# Patient Record
Sex: Male | Born: 1955 | Race: White | Hispanic: No | Marital: Married | Smoking: Former smoker
Health system: Southern US, Community
[De-identification: ages and names within clinical notes are randomized; demographics above are authoritative.]

## PROBLEM LIST (undated history)

## (undated) DIAGNOSIS — B20 Human immunodeficiency virus [HIV] disease: Secondary | ICD-10-CM

## (undated) DIAGNOSIS — Z21 Asymptomatic human immunodeficiency virus [HIV] infection status: Secondary | ICD-10-CM

## (undated) DIAGNOSIS — J449 Chronic obstructive pulmonary disease, unspecified: Secondary | ICD-10-CM

## (undated) DIAGNOSIS — I10 Essential (primary) hypertension: Secondary | ICD-10-CM

## (undated) DIAGNOSIS — K621 Rectal polyp: Secondary | ICD-10-CM

## (undated) DIAGNOSIS — K743 Primary biliary cirrhosis: Secondary | ICD-10-CM

## (undated) DIAGNOSIS — Z9889 Other specified postprocedural states: Secondary | ICD-10-CM

## (undated) DIAGNOSIS — K449 Diaphragmatic hernia without obstruction or gangrene: Secondary | ICD-10-CM

## (undated) DIAGNOSIS — C419 Malignant neoplasm of bone and articular cartilage, unspecified: Secondary | ICD-10-CM

## (undated) DIAGNOSIS — C3490 Malignant neoplasm of unspecified part of unspecified bronchus or lung: Secondary | ICD-10-CM

## (undated) DIAGNOSIS — K219 Gastro-esophageal reflux disease without esophagitis: Secondary | ICD-10-CM

## (undated) DIAGNOSIS — K5792 Diverticulitis of intestine, part unspecified, without perforation or abscess without bleeding: Secondary | ICD-10-CM

## (undated) HISTORY — PX: LIVER BIOPSY: SHX301

## (undated) HISTORY — PX: NASAL SEPTUM SURGERY: SHX37

## (undated) HISTORY — DX: Primary biliary cirrhosis: K74.3

## (undated) HISTORY — DX: Essential (primary) hypertension: I10

## (undated) HISTORY — DX: Other specified postprocedural states: Z98.890

## (undated) HISTORY — PX: OTHER SURGICAL HISTORY: SHX169

## (undated) HISTORY — DX: Chronic obstructive pulmonary disease, unspecified: J44.9

## (undated) HISTORY — PX: CHOLECYSTECTOMY: SHX55

## (undated) HISTORY — DX: Rectal polyp: K62.1

---

## 2001-03-04 ENCOUNTER — Ambulatory Visit (HOSPITAL_COMMUNITY): Admission: RE | Admit: 2001-03-04 | Discharge: 2001-03-04 | Payer: Self-pay | Admitting: Neurosurgery

## 2001-03-04 ENCOUNTER — Encounter: Payer: Self-pay | Admitting: Neurosurgery

## 2001-10-23 ENCOUNTER — Ambulatory Visit (HOSPITAL_COMMUNITY): Admission: RE | Admit: 2001-10-23 | Discharge: 2001-10-23 | Payer: Self-pay | Admitting: Specialist

## 2001-10-23 ENCOUNTER — Encounter: Payer: Self-pay | Admitting: Specialist

## 2002-10-30 ENCOUNTER — Ambulatory Visit (HOSPITAL_COMMUNITY): Admission: RE | Admit: 2002-10-30 | Discharge: 2002-10-30 | Payer: Self-pay | Admitting: Specialist

## 2002-10-30 ENCOUNTER — Encounter: Payer: Self-pay | Admitting: Specialist

## 2003-11-23 ENCOUNTER — Ambulatory Visit (HOSPITAL_COMMUNITY): Admission: RE | Admit: 2003-11-23 | Discharge: 2003-11-23 | Payer: Self-pay | Admitting: Family Medicine

## 2004-07-20 ENCOUNTER — Ambulatory Visit (HOSPITAL_COMMUNITY): Admission: RE | Admit: 2004-07-20 | Discharge: 2004-07-20 | Payer: Self-pay | Admitting: Family Medicine

## 2004-11-25 ENCOUNTER — Emergency Department (HOSPITAL_COMMUNITY): Admission: EM | Admit: 2004-11-25 | Discharge: 2004-11-25 | Payer: Self-pay | Admitting: Emergency Medicine

## 2004-11-28 ENCOUNTER — Ambulatory Visit (HOSPITAL_COMMUNITY): Admission: RE | Admit: 2004-11-28 | Discharge: 2004-11-28 | Payer: Self-pay | Admitting: Family Medicine

## 2004-11-29 ENCOUNTER — Encounter (HOSPITAL_COMMUNITY): Admission: RE | Admit: 2004-11-29 | Discharge: 2004-12-29 | Payer: Self-pay | Admitting: Family Medicine

## 2004-12-09 ENCOUNTER — Ambulatory Visit (HOSPITAL_COMMUNITY): Admission: RE | Admit: 2004-12-09 | Discharge: 2004-12-09 | Payer: Self-pay | Admitting: General Surgery

## 2005-01-24 ENCOUNTER — Ambulatory Visit: Payer: Self-pay | Admitting: Internal Medicine

## 2005-03-06 ENCOUNTER — Ambulatory Visit: Payer: Self-pay | Admitting: Internal Medicine

## 2005-07-25 ENCOUNTER — Ambulatory Visit: Payer: Self-pay | Admitting: Internal Medicine

## 2005-10-26 ENCOUNTER — Ambulatory Visit: Payer: Self-pay | Admitting: Internal Medicine

## 2006-04-19 ENCOUNTER — Ambulatory Visit: Payer: Self-pay | Admitting: Internal Medicine

## 2006-04-24 ENCOUNTER — Ambulatory Visit (HOSPITAL_COMMUNITY): Admission: RE | Admit: 2006-04-24 | Discharge: 2006-04-24 | Payer: Self-pay | Admitting: Family Medicine

## 2006-06-16 ENCOUNTER — Ambulatory Visit (HOSPITAL_COMMUNITY): Admission: RE | Admit: 2006-06-16 | Discharge: 2006-06-16 | Payer: Self-pay | Admitting: Family Medicine

## 2006-11-15 ENCOUNTER — Ambulatory Visit: Payer: Self-pay | Admitting: Internal Medicine

## 2007-05-07 ENCOUNTER — Ambulatory Visit (HOSPITAL_COMMUNITY): Admission: RE | Admit: 2007-05-07 | Discharge: 2007-05-07 | Payer: Self-pay | Admitting: Family Medicine

## 2007-09-19 DIAGNOSIS — Z9889 Other specified postprocedural states: Secondary | ICD-10-CM

## 2007-09-19 HISTORY — DX: Other specified postprocedural states: Z98.890

## 2007-09-19 HISTORY — PX: COLONOSCOPY: SHX174

## 2007-10-23 ENCOUNTER — Ambulatory Visit: Payer: Self-pay | Admitting: Gastroenterology

## 2008-04-10 ENCOUNTER — Encounter: Payer: Self-pay | Admitting: Internal Medicine

## 2008-04-10 ENCOUNTER — Ambulatory Visit: Payer: Self-pay | Admitting: Internal Medicine

## 2008-04-10 ENCOUNTER — Ambulatory Visit (HOSPITAL_COMMUNITY): Admission: RE | Admit: 2008-04-10 | Discharge: 2008-04-10 | Payer: Self-pay | Admitting: Internal Medicine

## 2008-06-02 ENCOUNTER — Ambulatory Visit (HOSPITAL_COMMUNITY): Admission: RE | Admit: 2008-06-02 | Discharge: 2008-06-02 | Payer: Self-pay | Admitting: Family Medicine

## 2009-01-20 DIAGNOSIS — K7689 Other specified diseases of liver: Secondary | ICD-10-CM

## 2009-01-20 DIAGNOSIS — R894 Abnormal immunological findings in specimens from other organs, systems and tissues: Secondary | ICD-10-CM

## 2009-01-20 DIAGNOSIS — R109 Unspecified abdominal pain: Secondary | ICD-10-CM | POA: Insufficient documentation

## 2009-01-20 DIAGNOSIS — K743 Primary biliary cirrhosis: Secondary | ICD-10-CM

## 2009-01-20 DIAGNOSIS — Z9089 Acquired absence of other organs: Secondary | ICD-10-CM | POA: Insufficient documentation

## 2009-01-21 ENCOUNTER — Ambulatory Visit: Payer: Self-pay | Admitting: Internal Medicine

## 2009-02-02 ENCOUNTER — Encounter (INDEPENDENT_AMBULATORY_CARE_PROVIDER_SITE_OTHER): Payer: Self-pay

## 2009-02-08 ENCOUNTER — Encounter: Payer: Self-pay | Admitting: Internal Medicine

## 2009-02-08 LAB — CONVERTED CEMR LAB
ALT: 16 units/L (ref 0–53)
Alkaline Phosphatase: 67 units/L (ref 39–117)
Bilirubin, Direct: 0.1 mg/dL (ref 0.0–0.3)
Indirect Bilirubin: 0.5 mg/dL (ref 0.0–0.9)
Total Protein: 7.4 g/dL (ref 6.0–8.3)

## 2009-10-28 ENCOUNTER — Encounter: Payer: Self-pay | Admitting: Urgent Care

## 2010-01-06 ENCOUNTER — Encounter (INDEPENDENT_AMBULATORY_CARE_PROVIDER_SITE_OTHER): Payer: Self-pay

## 2010-01-12 ENCOUNTER — Encounter: Payer: Self-pay | Admitting: Internal Medicine

## 2010-01-19 ENCOUNTER — Encounter (INDEPENDENT_AMBULATORY_CARE_PROVIDER_SITE_OTHER): Payer: Self-pay

## 2010-01-19 LAB — CONVERTED CEMR LAB
ALT: 17 units/L (ref 0–53)
Bilirubin, Direct: 0.1 mg/dL (ref 0.0–0.3)

## 2010-01-20 ENCOUNTER — Encounter (INDEPENDENT_AMBULATORY_CARE_PROVIDER_SITE_OTHER): Payer: Self-pay | Admitting: *Deleted

## 2010-04-11 ENCOUNTER — Telehealth (INDEPENDENT_AMBULATORY_CARE_PROVIDER_SITE_OTHER): Payer: Self-pay

## 2010-04-11 ENCOUNTER — Ambulatory Visit: Payer: Self-pay | Admitting: Gastroenterology

## 2010-04-18 ENCOUNTER — Telehealth: Payer: Self-pay | Admitting: Gastroenterology

## 2010-04-27 ENCOUNTER — Ambulatory Visit (HOSPITAL_COMMUNITY): Admission: RE | Admit: 2010-04-27 | Discharge: 2010-04-27 | Payer: Self-pay | Admitting: Ophthalmology

## 2010-08-29 ENCOUNTER — Encounter (INDEPENDENT_AMBULATORY_CARE_PROVIDER_SITE_OTHER): Payer: Self-pay

## 2010-09-23 ENCOUNTER — Encounter (INDEPENDENT_AMBULATORY_CARE_PROVIDER_SITE_OTHER): Payer: Self-pay

## 2010-09-23 ENCOUNTER — Encounter: Payer: Self-pay | Admitting: Internal Medicine

## 2010-09-23 LAB — CONVERTED CEMR LAB
AST: 19 units/L (ref 0–37)
Alkaline Phosphatase: 69 units/L (ref 39–117)
Indirect Bilirubin: 0.5 mg/dL (ref 0.0–0.9)
Total Bilirubin: 0.7 mg/dL (ref 0.3–1.2)
Total Protein: 7.7 g/dL (ref 6.0–8.3)

## 2010-10-18 NOTE — Letter (Signed)
Summary: Normal Results Letter  [facility]  [address]   [place], [place] [address]   Phone: [phone]  Fax: [phone]    Jan 19, 2010  Jeffrey Gutierrez [address] [place], [place]  [address] 1956-08-01   Dear Jeffrey Gutierrez,   Our office has been trying to contact you.  We just wanted to inform you that all of your lab tests were normal.  If you have any questions, please call the office at [phone].   Thank you,    Hendricks Limes, LPN Cloria Spring, LPN  [facility] Ph: [phone]   Fax: [phone]

## 2010-10-18 NOTE — Letter (Signed)
Summary: Recall, Labs Needed  [facility]  [address]   [place], [place] [address]   Phone: [phone]  Fax: [phone]    January 06, 2010  DRAGO HAMMONDS [address] [place], [place]  [address] 1956/06/22   Dear Mr. DEERE,   Our records indicate it is time to repeat your blood work.  You can take the enclosed form to the lab on or near the date indicated.  Please make note of the new location of the lab:   [address], 2nd floor   [facility]  Our office will call you within a week to ten business days with the results.  If you do not hear from [place] in 10 business days, you should call the office.  If you have any questions regarding this, call the office at [phone], and ask for the nurse.  Labs are due on 02/08/10.   Sincerely,    Hendricks Limes LPN  [facility] Ph: [phone]   Fax: [phone]

## 2010-10-18 NOTE — Progress Notes (Signed)
Summary: phone note/Rx on back ordeer  Phone Note From Pharmacy   Caller: [facility] [place] Summary of Call: Fleet Cont[facility] [place], said they had Rx for pt for Twin Rix written by Tana Coast, PA....it is on  manufacturers back order.  Please advise! Initial call taken by: Cloria Spring LPN,  April 11, 2010 10:10 AM     Appended Document: phone note/Rx on back ordeer No rush, just make sure the patient is notified. Fill when available.   Appended Document: phone note/Rx on back ordeer Informed pt. [facility], they discarded the order and need new order to have so when it does come in.  Appended Document: phone note/Rx on back ordeer    Prescriptions: TWINRIX 720-20 SUSP (HEPATITIS A-HEP B RECOMB VAC) One mL IM X 3 at 0, 1, and 6 months. To be injected by PCP.  #1 x 2   Entered and Authorized by:   Leanna Battles. Dixon Boos   Signed by:   Leanna Battles Lewis PA-C on 04/12/2010   Method used:   [facility]  [place] [address]       [address]       [place], [place]  [address]       Ph: [phone] or [phone]       Fax: [phone]   RxID:   [id number]

## 2010-10-18 NOTE — Letter (Signed)
Summary: Recall Office Visit  [facility]  [address]   [place], [place] [address]   Phone: [phone]  Fax: [phone]      Jan 20, 2010   Jeffrey Gutierrez [address] [place], [place]  [address] 12/02/1955   Dear Mr. BORMAN,   According to our records, it is time for you to schedule a follow-up office visit with [place].   At your convenience, please call [phone] to schedule an office visit. If you have any questions, concerns, or feel that this letter is in error, we would appreciate your call.   Sincerely,    Diana Eves  [facility] Ph: [phone]   Fax: [phone]

## 2010-10-18 NOTE — Progress Notes (Signed)
Summary: urso  ---- Converted from flag ---- ---- 04/12/2010 3:30 PM, Jonathon Bellows MD, Caleen Essex wrote: would strive for 13-15 mg /kg per day for maximum efficacy. Is he on Ursodiol or urso-forte? - latter may be a little better tolerated.; don't have any specific recommendations on how/when to take - a little trial and error with dosing with goal of getting all of it in in a given day is ok w me.  I understand lower dose may be doing the job ???compromise at 900 mg if 1200mg  just not going to work  ---- 04/11/2010 9:19 AM, Leanna Battles. Dixon Boos wrote: patient with PBC. c/o nausea with URSO so taking on ly 600mg  at bedtime instead of twice daily. LFTs normal after doing this for one month. Rechecking in 6 months. Can he take increased dose (1200mg ) once daily OR should he try taking 300mg  BF and 300mg  Lunch and 600mg  at at bedtime OR continue reduced dose? ------------------------------  Please let pt know, I discussed with RMR regarding him only taking URSO 600mg  at bedtime secondary to nause. He encourages a minimum of 900mg  daily but recommends 1200mg  daily for maximum efficacy. Patient need to try different dosing to find what works for him. For example, try 300mg  at BF, 300mg  at lunch, 600mg  at bedtime.   Appended Document: urso tried to call pt- NA  Appended Document: urso LMOM to call.  Appended Document: urso Pt informed.

## 2010-10-18 NOTE — Medication Information (Signed)
Summary: Tax adviser   Imported By: Diana Eves 10/28/2009 08:31:02  _____________________________________________________________________  External Attachment:    Type:   Image     Comment:   External Document  Appended Document: RX FolderUrso    Prescriptions: URSODIOL 300 MG CAPS (URSODIOL) 2 by mouth BID  #120 x 3   Entered and Authorized by:   Joselyn Arrow FNP-BC   Signed by:   Joselyn Arrow FNP-BC on 10/28/2009   Method used:   [facility]        [facility]  [place] [address]       [address]       [place], [place]  [address]       Ph: [phone] or [phone]       Fax: [phone]   RxID:   [id number]

## 2010-10-18 NOTE — Assessment & Plan Note (Signed)
Summary: 1 YR F/U/CM   Visit Type:  f/u Primary Care Provider:  [facility]  Chief Complaint:  follow up from bloodwork and doing ok.  History of Present Illness: Mr. Jeffrey Gutierrez is a pleasant 55 y/o WM, with PBC, who presents for one year f/u. He had LFTs in 4/11 which were normal. He feels well. Denies pruritis, fatigue, abd pain, constipation, diarrhea, melena, brbpr, heart burn, weight loss. He states he only takes URSO 600mg  at bedtime due to nausea. He has been doing this since 3/11. He states he had TSH, lipid panel done with his last physical in fall of 2010. He has not had Hep A or B vaccines done. He reports that his PCP will administer the injection but he need RX first.         Current Medications (verified): 1)  Ibuprofen .... Take As Needed 2)  Allegra 180 Mg Tabs (Fexofenadine Hcl) .... Take As Needed 3)  Ursodiol 300 Mg Caps (Ursodiol) .... 2 By Mouth Bid 4)  Adult Aspirin Ec Low Strength 81 Mg Tbec (Aspirin) .... Once Daily 5)  Prednisone 10 Mg Tabs (Prednisone) .... X 10 Days 6)  Fish Oil 1000 Mg Caps (Omega-3 Fatty Acids) .... Once Daily 7)  Mens Mvi .... One By Mouth Daily  Allergies (verified): 1)  ! 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 Head:  Normocephalic and atraumatic. Eyes:  sclera nonicteric Mouth:  op  moist Lungs:  Clear throughout to auscultation. Heart:  Regular rate and rhythm; no murmurs, rubs,  or bruits. Abdomen:  Bowel sounds normal.  Abdomen is soft, nontender, nondistended.  No rebound or guarding.  No hepatosplenomegaly, masses or hernias.  No abdominal bruits.  Extremities:  No clubbing, cyanosis, edema or deformities noted. Neurologic:  Alert and  oriented x4;  grossly normal neurologically. Skin:  Intact without significant lesions or rashes. Psych:  Alert and cooperative. Normal mood and affect.  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 Last LFTs normal. Patient taking 1/2 of recommended dose of URSO secondary to nausea related to medication. He has tried with food but no improvement. Will discuss further with Dr. Jena Gauss. He reports normal TSH, lipid panels. He will continue to have those checked yearly with PCP. Encouraged he take at least 1000mg  of Calcium with vit D daily. Will repeat bone mineral density test next year. LFTs in six months. He is interested in pursuing hep a and b vaccinations now and request prescription. He will take to PCP for injection. OV in one year or sooner if needed.   Orders: T-Hepatic Function [phone]) Est. Patient Level II ([address]) Prescriptions: TWINRIX 720-20 SUSP (HEPATITIS A-HEP B RECOMB VAC) One mL IM  X 3 at 0, 1, and 6 months. To be injected by PCP.  #1 x 2   Entered and Authorized by:   Leanna Battles. Dixon Boos   Signed by:   Leanna Battles Jannah Guardiola PA-C on 04/11/2010   Method used:   [facility]        [facility]  [place] [address] [facility]       [address]       [place], [place]  [address]       Ph: [phone] or [phone]       Fax: [phone]   RxID:   [id number]

## 2010-10-19 HISTORY — PX: INGUINAL HERNIA REPAIR: SUR1180

## 2010-10-20 NOTE — Letter (Signed)
Summary: Normal Results Letter  [facility]  [address]   [place], [place] [address]   Phone: [phone]  Fax: [phone]    September 23, 2010  Jeffrey Gutierrez [address] [place], [place]  [address] 11/08/1955   Dear Jeffrey Gutierrez,   Our office has been trying to contact you.  We just wanted to inform you that all of your labs were normal. You will need to repeat them in 6 months. We will mail it to you.  If you have any questions, please call the office at [phone].   Thank you,    Hendricks Limes, LPN Cloria Spring, LPN  [facility] Ph: [phone]   Fax: [phone]

## 2010-10-20 NOTE — Miscellaneous (Signed)
Summary: Orders Update  Clinical Lists Changes  Orders: Added new Test order of T-Hepatic Function ([id number]) - Signed 

## 2010-10-20 NOTE — Letter (Signed)
Summary: Recall, Labs Needed  [facility]  [address]   [place], [place] [address]   Phone: [phone]  Fax: [phone]    August 29, 2010  STEIN WINDHORST [address] [place], [place]  [address] [birth year]   Dear Mr. KILGORE,   Our records indicate it is time to repeat your blood work.  You can take the enclosed form to the lab on or near the date indicated.  Please make note of the new location of the lab:   [address], 2nd [address]   [facility]  Our office will call you within a week to ten business days with the results.  If you do not hear from [place] in 10 business days, you should call the office.  If you have any questions regarding this, call the office at [phone], and ask for the nurse.  Labs are due on 09/29/10.   Sincerely,    Hendricks Limes LPN  [facility] Ph: [phone]   Fax: [phone]

## 2010-11-03 ENCOUNTER — Other Ambulatory Visit: Payer: Self-pay | Admitting: General Surgery

## 2010-11-03 ENCOUNTER — Encounter (HOSPITAL_COMMUNITY): Payer: 59

## 2010-11-03 DIAGNOSIS — Z01810 Encounter for preprocedural cardiovascular examination: Secondary | ICD-10-CM | POA: Insufficient documentation

## 2010-11-03 DIAGNOSIS — Z01812 Encounter for preprocedural laboratory examination: Secondary | ICD-10-CM | POA: Insufficient documentation

## 2010-11-03 LAB — CBC
MCH: 29.9 pg (ref 26.0–34.0)
MCHC: 34.5 g/dL (ref 30.0–36.0)
MCV: 86.6 fL (ref 78.0–100.0)
Platelets: 144 10*3/uL — ABNORMAL LOW (ref 150–400)
RDW: 13.4 % (ref 11.5–15.5)
WBC: 4.4 10*3/uL (ref 4.0–10.5)

## 2010-11-03 LAB — BASIC METABOLIC PANEL
BUN: 7 mg/dL (ref 6–23)
Chloride: 100 mEq/L (ref 96–112)
Glucose, Bld: 131 mg/dL — ABNORMAL HIGH (ref 70–99)
Potassium: 3.9 mEq/L (ref 3.5–5.1)

## 2010-11-03 LAB — PROTIME-INR
INR: 1.03 (ref 0.00–1.49)
Prothrombin Time: 13.7 seconds (ref 11.6–15.2)

## 2010-11-03 LAB — SURGICAL PCR SCREEN
MRSA, PCR: NEGATIVE
Staphylococcus aureus: NEGATIVE

## 2010-11-05 NOTE — H&P (Signed)
  NAME:  Jeffrey Gutierrez, Jeffrey Gutierrez                ACCOUNT NO.:  [id number]  MEDICAL RECORD NO.:  [id number]           PATIENT TYPE:  O  LOCATION:  DAY                           FACILITY:  [facility]  PHYSICIAN:  Dalia Heading, M.D.  DATE OF BIRTH:  Nov 21, 1955  DATE OF ADMISSION:  11/03/2010 DATE OF DISCHARGE:  LH                             HISTORY & PHYSICAL   CHIEF COMPLAINT:  Right inguinal hernia.  HISTORY OF PRESENT ILLNESS:  The patient is a 55 year old white male who is referred for evaluation and treatment of a right inguinal hernia.  It has been present for many years, but recently it is increasing in size and is causing him discomfort.  He also has a left inguinal hernia, which is not bothering him.  PAST MEDICAL HISTORY:  Nonspecific hepatitis, genetic etiology.  PAST SURGICAL HISTORY:  Cholecystectomy.  CURRENT MEDICATIONS:  Ursodiol 300 mg p.o. daily, baby aspirin 1 tablet p.o. daily which he is holding, fish oil.  ALLERGIES:  HYDROCODONE. REVIEW OF SYSTEMS:  The patient does smoke a pack of cigarettes a day. He drinks alcohol socially.  He denies any other cardiopulmonary difficulties or bleeding disorders.  Dr. Jena Gauss of gastroenterology has been following the patient's abnormal liver test for many years and it is felt that this is just secondary to a genetic etiology.  FAMILY MEDICAL HISTORY:  Noncontributory.  PHYSICAL EXAMINATION:  GENERAL:  The patient is a well-developed, well- nourished white male in no acute distress. HEENT:  Unremarkable. LUNGS:  Clear to auscultation with equal breath sounds bilaterally. HEART:  Regular rate and rhythm without S3, S4, or murmurs. ABDOMEN:  Soft, nontender, nondistended.  No hepatosplenomegaly or masses are noted.  Bilateral inguinal hernias are present. GENITOURINARY:  Within normal limits.  IMPRESSION:  Right inguinal hernia.  PLAN:  The patient is scheduled for right inguinal herniorrhaphy on November 07, 2010.  The risks and  benefits of the procedure including bleeding, infection, pain, and the possibility of recurrence of the hernia were fully explained to the patient, gave informed consent.     Dalia Heading, M.D.     MAJ/MEDQ  D:  11/03/2010  T:  11/04/2010  Job:  [id number]  cc:   Justin Mend, MD  Electronically Signed by Franky Macho M.D. on 11/05/2010 06:47:47 PM

## 2010-11-07 ENCOUNTER — Ambulatory Visit (HOSPITAL_COMMUNITY)
Admission: RE | Admit: 2010-11-07 | Discharge: 2010-11-07 | Disposition: A | Discharge status: Home / Self Care | Payer: 59 | Source: Ambulatory Visit | Attending: General Surgery | Admitting: General Surgery

## 2010-11-07 ENCOUNTER — Emergency Department (HOSPITAL_COMMUNITY)
Admission: EM | Admit: 2010-11-07 | Discharge: 2010-11-07 | Disposition: A | Discharge status: Home / Self Care | Payer: 59 | Attending: Emergency Medicine | Admitting: Emergency Medicine

## 2010-11-07 DIAGNOSIS — Z01812 Encounter for preprocedural laboratory examination: Secondary | ICD-10-CM | POA: Insufficient documentation

## 2010-11-07 DIAGNOSIS — T85898A Other specified complication of other internal prosthetic devices, implants and grafts, initial encounter: Secondary | ICD-10-CM | POA: Insufficient documentation

## 2010-11-07 DIAGNOSIS — K4090 Unilateral inguinal hernia, without obstruction or gangrene, not specified as recurrent: Secondary | ICD-10-CM | POA: Insufficient documentation

## 2010-11-07 DIAGNOSIS — Y838 Other surgical procedures as the cause of abnormal reaction of the patient, or of later complication, without mention of misadventure at the time of the procedure: Secondary | ICD-10-CM | POA: Insufficient documentation

## 2010-11-09 NOTE — Op Note (Signed)
  NAME:  Jeffrey Gutierrez, Jeffrey Gutierrez                ACCOUNT NO.:  [id number]  MEDICAL RECORD NO.:  [id number]           PATIENT TYPE:  E  LOCATION:  APED                          FACILITY:  [facility]  PHYSICIAN:  Dalia Heading, M.D.  DATE OF BIRTH:  12-07-1955  DATE OF PROCEDURE:  11/07/2010 DATE OF DISCHARGE:  11/07/2010                              OPERATIVE REPORT   PREOPERATIVE DIAGNOSIS:  Right inguinal hernia.  POSTOPERATIVE DIAGNOSIS:  Right inguinal hernia.  PROCEDURE:  Right inguinal herniorrhaphy.  SURGEON:  Dalia Heading, MD  ANESTHESIA:  General.  INDICATIONS:  The patient is a 55 year old white male who presents with a symptomatic right inguinal hernia.  The risks and benefits of the procedure including bleeding, infection, pain, and the possibility of recurrence of the hernia were fully explained to the patient, gave informed consent.  PROCEDURE NOTE:  The patient was placed in the supine position.  After general anesthesia was administered, the right groin region was prepped and draped using the usual sterile technique with Betadine.  Surgical site confirmation was performed.  A transverse incision was made in the right groin region down to the external oblique aponeurosis.  The aponeurosis was incised to the external ring.  A Penrose drain was placed around the spermatic cord. The vas deferens was not within the spermatic cord.  The ilioinguinal nerve was identified and retracted inferiorly from the operative field. The patient had a large direct hernia.  This was incised at its base and inverted.  A large-size Bard mesh PerFix plug was then inserted and secured to the transversalis fascia using 2-0 Novafil interrupted sutures.  An onlay patch was then placed along the floor of the inguinal canal and secured superiorly to the conjoined tendon and inferiorly shelving edge Poupart ligament using 2-0 Novafil interrupted sutures. The patient also had the prominence of the  preperitoneal weakness lateral to the internal ring.  This was reduced using a medium-size PerFix plug.  This was all secured to the conjoined tendon and shelving edge Poupart ligament using 2-0 Novafil interrupted sutures.  The internal ring was recreated using a 2-0 Novafil interrupted suture.  An On-Q pain pump was then inserted underneath the external oblique aponeurosis.  The aponeurosis was closed using a 2-0 Vicryl running suture.  The subcutaneous layer was reapproximated using a 3-0 Vicryl interrupted suture.  The skin was closed using a 4-0 Vicryl subcuticular suture.  Sensorcaine 0.5% was instilled into the surrounding wound. Octylseal covering was applied.  All tape and needle counts were correct at the end of the procedure. The patient was awakened and transferred to PACU in stable condition.  COMPLICATIONS:  None.  SPECIMEN:  None.  BLOOD LOSS:  Minimal.     Dalia Heading, M.D.     MAJ/MEDQ  D:  11/07/2010  T:  11/07/2010  Job:  [id number]  cc:   Alphonsus Sias [address] [place] [address]  Electronically Signed by Franky Macho M.D. on 11/09/2010 07:32:34 AM

## 2011-01-31 NOTE — Assessment & Plan Note (Signed)
NAMEMarland Kitchen  REGGIE, BISE                 CHART#:  [id number]   DATE:  10/23/2007                       DOB:  1955/12/08   CHIEF COMPLAINT:  Followup PVC.   SUBJECTIVE:  The patient is a 55 year old male who was diagnosed with  PBC after liver biopsy in May 2006.  He has been maintained on ursodiol  600 mg b.i.d.  He has done quite well.  He denies any fatigue.  Denies  any pruritus.  Denies any fevers or chills, or jaundice.  His weight has  remained stable.  Appetite is good.  He has not yet received hepatitis A  and B vaccines, but does need these.  He has had a bone density study,  which showed normal bone density of the lumbar spine and the hip, and  degenerative changes in the lower lumbar spine and hip.  Overall, he  feels quite well.   CURRENT MEDICATIONS:  See the list from 10/23/2007.   ALLERGIES:  No known drug allergies.   OBJECTIVE:  VITAL SIGNS:  Weight 204 pounds, height 73 inches,  temperature 97.7, blood pressure 124/78, pulse 64.  He is an alert, oriented, pleasant, and cooperative, in no acute  distress.  HEENT:  Pupils equal, round, and reactive to light.  Sclerae clear,  nonicteric.  Conjunctivae pink.  Oropharynx pink and moist without  lesions.  CHEST:  Heart regular rate and rhythm.  Normal S1, S2.  ABDOMEN:  Positive bowel sounds x4.  No bruits auscultated.  Soft.  Nontender, nondistended without palpable mass or hepatosplenomegaly.  No  rebound tenderness or guarding.  EXTREMITIES:  Without clubbing or edema.   ASSESSMENT:  The patient is a 55 year old male with primary biliary  cirrhosis doing well on ursodiol.   PLAN:  1. He will need hepatitis A and B vaccines.  Urged to get these.  He      should remain on      vitamins A, D, E, and K, as well as calcium.  CBC, LFT, and INR.  2. Coloscopy in June 2009 when he turns 52.       Lorenza Burton, N.P.  Electronically Signed     R. Roetta Sessions, M.D.  Electronically Signed    KJ/MEDQ  D:   10/24/2007  T:  10/24/2007  Job:  [id number]   cc:   Patrica Duel, M.D.

## 2011-01-31 NOTE — Op Note (Signed)
NAME:  Jeffrey Gutierrez, Jeffrey Gutierrez                ACCOUNT NO.:  [id number]   MEDICAL RECORD NO.:  [id number]          PATIENT TYPE:  AMB   LOCATION:  DAY                           FACILITY:  [facility]   PHYSICIAN:  R. Roetta Sessions, M.D. DATE OF BIRTH:  05-19-1956   DATE OF PROCEDURE:  04/10/2008  DATE OF DISCHARGE:                               OPERATIVE REPORT   PROCEDURE:  Screening colonoscopy, colonoscopy with biopsy.   INDICATIONS FOR PROCEDURE:  A 55 year old gentleman who comes for  screening colonoscopy.  He has no lower GI tract symptoms.  He has never  had his lower GI tract imaged.  No family history of colorectal  neoplasia.  Colonoscopy is now being done as a screening maneuver.  The  risks, benefits, alternatives, and limitations have been reviewed,  questions answered, and all parties were agreeable.   PROCEDURE NOTE:  O2 saturation, blood pressure, pulse, and respirations  were monitored throughout the entire procedure.   CONSCIOUS SEDATION:  1. Versed 4 mg IV.  2. Demerol 100 g IV in divided doses.   INSTRUMENT:  Pentax video chip system.   FINDINGS:  Digital rectal exam revealed no abnormalities.  Endoscopic  findings:  The prep was adequate.  Colon:  Colonic mucosa was surveyed  from the rectosigmoid junction through the left, transverse, right  colon, to the appendiceal orifice, ileocecal valve, and cecum.  These  structures were well seen and photographed for the record.  From this  level, the scope was slowly withdrawn.  All previously mentioned mucosal  surfaces were again seen.  The patient had scattered pan colonic  diverticula (left side greater than right side).  There were some  submucosal petechiae around the couple of the tics in the sigmoid  segment.  Otherwise, the colonic mucosa appeared normal.  Scope was  pulled down in the rectum, where a thorough examination of the rectal  mucosa including retroflexed view of the anal verge demonstrated 2 small  diminutive  polyps at 10 cm, which were cold biopsied/removed.  The  remainder of the rectal mucosa appeared normal.  The patient tolerated  the procedure well and was reacted in Endoscopy.   IMPRESSION:  1. Diminutive rectal polyps, status post cold biopsy removal,      otherwise unremarkable rectal mucosa.  2. Pancolonic diverticula, scattered submucosal petechial hemorrhage      around several sigmoid diverticula of doubtful clinical      significance and remainder of colonic mucosa appeared normal.   RECOMMENDATIONS:  1. Diverticulosis literature provided to Mr. Larch.  2. Follow up path.  3. Further recommendations to follow.      Jonathon Bellows, M.D.  Electronically Signed     RMR/MEDQ  D:  04/10/2008  T:  04/10/2008  Job:  [id number]   cc:   Patrica Duel, M.D.  Fax: [phone]

## 2011-02-03 NOTE — H&P (Signed)
NAME:  Jeffrey Gutierrez, Jeffrey Gutierrez                ACCOUNT NO.:  [id number]   MEDICAL RECORD NO.:  [id number]          PATIENT TYPE:  AMB   LOCATION:  DAY                           FACILITY:  [facility]   PHYSICIAN:  Dalia Heading, M.D.  DATE OF BIRTH:  [birth year]   DATE OF ADMISSION:  12/06/2004  DATE OF DISCHARGE:  LH                                HISTORY & PHYSICAL   CHIEF COMPLAINT:  Chronic cholecystitis.   HISTORY OF PRESENT ILLNESS:  The patient is a 55 year old, white male who is  referred for evaluation and treatment of biliary colic secondary to chronic  cholecystitis.  He has been having right upper quadrant abdominal pain with  radiation to the right flank, nausea and bloating for several weeks.  He  does have fatty food intolerance.  No fevers, chills or jaundice have been  noted.   PAST MEDICAL HISTORY:  Extrinsic allergies.   PAST SURGICAL HISTORY:  1.  Deviated septum repair.  2.  Ulnar nerve surgery.   CURRENT MEDICATIONS:  Allegra p.r.n.   ALLERGIES:  No known drug allergies.   REVIEW OF SYSTEMS:  The patient smokes two packs of cigarettes per day.  He  drinks alcohol rarely.  He denies any other cardiopulmonary difficulties or  bleeding disorders.   PHYSICAL EXAMINATION:  GENERAL:  The patient is a well-developed, well-  nourished, white male in no acute distress.  VITAL SIGNS:  Afebrile with vital signs stable.  HEENT:  No scleral icterus.  LUNGS:  Clear to auscultation with equal breath sounds bilaterally.  HEART:  Regular rate and rhythm without S3, S4 or murmurs.  ABDOMEN:  Soft, nondistended.  Tender in the right upper quadrant to  palpation.  No hepatosplenomegaly, masses or hernias identified.   LABORATORY DATA AND X-RAY FINDINGS:  Ultrasound of the gallbladder reveals  no stones.  Hepatobiliary scan reveals chronic cholecystitis with a low  gallbladder ejection fraction and reproducible symptoms with a fatty meal.   IMPRESSION:  Chronic cholecystitis.   PLAN:   The patient is scheduled for laparoscopic cholecystectomy on December 06, 2004.  The risks and benefits of the procedure including bleeding,  infection, hepatobiliary injury and the possibility of an open procedure  were fully explained to the patient, gaining informed consent.      MAJ/MEDQ  D:  12/06/2004  T:  12/06/2004  Job:  [id number]   cc:   Patrica Duel, M.D.  [address]  [place]  [place] [address]  Fax: [phone]   [facility]

## 2011-02-03 NOTE — Op Note (Signed)
NAME:  Jeffrey Gutierrez, Jeffrey Gutierrez                ACCOUNT NO.:  [id number]   MEDICAL RECORD NO.:  [id number]          PATIENT TYPE:  AMB   LOCATION:  DAY                           FACILITY:  [facility]   PHYSICIAN:  Dalia Heading, M.D.  DATE OF BIRTH:  May 01, 1956   DATE OF PROCEDURE:  12/09/2004  DATE OF DISCHARGE:                                 OPERATIVE REPORT   PREOPERATIVE DIAGNOSIS:  Chronic cholecystitis.   POSTOPERATIVE DIAGNOSIS:  Chronic cholecystitis.   PROCEDURE:  Laparoscopic cholecystectomy, liver biopsy.   SURGEON:  Dr. Franky Macho.   ASSISTANT:  Dr. Arna Snipe.   ANESTHESIA:  General endotracheal.   INDICATIONS:  The patient is a 55 year old white male who is referred for  evaluation and treatment of biliary colic secondary to chronic  cholecystitis. The liver tests were also noted be mildly elevated. Total  bilirubin is within normal limits. The risks and benefits of the procedure  including bleeding, infection, hepatobiliary, and the possibility an open  procedure were fully explained to the patient, who gave informed consent.   PROCEDURE NOTE:  The patient was placed in supine position. After induction  of general endotracheal anesthesia, the abdomen was prepped and draped using  the usual sterile technique with Betadine. Surgical site confirmation was  performed.   A supraumbilical incision was made down to the fascia. Veress needle was  introduced into the abdominal cavity, and confirmation of placement was done  using the saline drop test. The abdomen was then insufflated to 16 mmHg  pressure. A 11-mm trocar was introduced into the abdominal cavity under  direct visualization without difficulty. The patient was placed in reversed  Trendelenburg position. Additional 11-mm trocar was placed in the epigastric  region, and 5-mm trocars were placed in the right upper quadrant and right  flank regions. Liver was inspected and noted to normal limits. A liver  biopsy was  performed just above the gallbladder fossa. This was sent to  pathology for further examination. The gallbladder was retracted superiorly  and laterally. The dissection was begun around the infundibulum of the  gallbladder. The cystic duct was first identified. Its juncture to the  infundibulum fully identified. Endoclips were placed proximally and distally  on the cystic duct, and cystic duct was divided. This was likewise done to  the cystic artery. The gallbladder then freed away from the gallbladder  fossa using Bovie electrocautery. The gallbladder was delivered through the  epigastric trocar site using EndoCatch bag. The gallbladder fossa was  inspected. No abnormal bleeding or bile leakage was noted. Surgicel was  placed in the gallbladder fossa. All fluid and air were then evacuated from  the abdominal cavity prior to removal of the trocars.   All wounds were irrigated with normal saline. All wounds were injected with  0.5% Sensorcaine. The supraumbilical fascia was reapproximated using an 0  Vicryl interrupted suture. All skin incisions were closed using staples.  Betadine ointment and dry sterile dressings were applied.   All tape and needle counts were correct at the end of the procedure. The  patient was extubated in the  operating room and went back to recovery room  awake in stable condition.   COMPLICATIONS:  None.   SPECIMEN:  Gallbladder, liver biopsy.   BLOOD LOSS:  Minimal.      MAJ/MEDQ  D:  12/09/2004  T:  12/09/2004  Job:  [id number]   cc:   Patrica Duel, M.D.  [address]  [place]  [place] [address]  Fax: [phone]

## 2011-03-23 ENCOUNTER — Encounter: Payer: Self-pay | Admitting: Internal Medicine

## 2011-03-24 ENCOUNTER — Other Ambulatory Visit: Payer: Self-pay | Admitting: Internal Medicine

## 2011-03-24 LAB — HEPATIC FUNCTION PANEL
ALT: 15 U/L (ref 0–53)
Albumin: 4 g/dL (ref 3.5–5.2)
Alkaline Phosphatase: 65 U/L (ref 39–117)
Bilirubin, Direct: 0.1 mg/dL (ref 0.0–0.3)
Indirect Bilirubin: 0.6 mg/dL (ref 0.0–0.9)
Total Protein: 8.1 g/dL (ref 6.0–8.3)

## 2011-04-13 ENCOUNTER — Encounter: Payer: Self-pay | Admitting: Gastroenterology

## 2011-04-13 ENCOUNTER — Ambulatory Visit (INDEPENDENT_AMBULATORY_CARE_PROVIDER_SITE_OTHER): Payer: 59 | Admitting: Gastroenterology

## 2011-04-13 VITALS — BP 140/83 | HR 78 | Temp 98.0°F | Ht 72.0 in | Wt 202.2 lb

## 2011-04-13 DIAGNOSIS — K745 Biliary cirrhosis, unspecified: Secondary | ICD-10-CM

## 2011-04-13 NOTE — Patient Instructions (Signed)
Continue taking your medications as prescribed.  Make sure you are taking 1000 mg of Calcium with Vit D daily (you may get this over the counter)  We will see you back in 1 year  We will have lab work in 6 months.

## 2011-04-13 NOTE — Assessment & Plan Note (Signed)
55 year old Caucasian male with dx in 2006; doing well clinically. Most recent labs WNL. Urso at 600 mg daily due to complaints of nausea with BID dosing. Needs at least 1200 mg daily for effective dosing. Will need to trial different avenues of dosing (i.e. Breakfast and lunch 300, dinner 600). Pt is otherwise doing well. Needs bone density scan this year as well as Hep A and B vaccines. Vaccines are being pursued at this time but not started yet. We will see him back in the office in 1 year, LFTs 6 mos.

## 2011-04-13 NOTE — Progress Notes (Signed)
Referring Provider: Colette Ribas, MD Primary Care Physician:  Colette Ribas, MD Primary Gastroenterologist: Dr. Jena Gauss.   Chief Complaint  Patient presents with  . Follow-up    doing good    HPI:   Jeffrey Gutierrez is a 55 year old Caucasian male who presents today as a yearly f/u. Diagnosed in 2006 with PBC. Has been doing well on Urso 600 BID. However, he is only taking once per day now. Although his most recent LFTs have been normal, he is somewhat under the recommended daily dosage.  He denies any fever or chills, N/V, abdominal pain, pruritis, jaundice. Denies constipation, diarrhea, melena, brbpr.   Hasn't done Hep A and Hep B vaccines. States he is in process of pursuing this. Wt 202, was 205 July 2011.  Most recent LFTs WNL July 2012. PT/INR, nl Feb 2012. Plts 144.   Past Medical History  Diagnosis Date  . PBC (primary biliary cirrhosis)     diagnosed 2006  . S/P colonoscopy 2009    pancolonic diverticula, hyperplastic rectal polyps    Past Surgical History  Procedure Date  . Cholecystectomy   . Liver biopsy   . Nasal septum surgery   . Arm surgery   . Inguinal hernia repair Feb 2012    Dr. Lovell Sheehan     Current Outpatient Prescriptions  Medication Sig Dispense Refill  . aspirin 81 MG tablet Take 81 mg by mouth daily.        . fish oil-omega-3 fatty acids 1000 MG capsule Take 2 g by mouth daily.        Marland Kitchen ibuprofen (ADVIL,MOTRIN) 200 MG tablet Take 200 mg by mouth every 6 (six) hours as needed.        . ursodiol (ACTIGALL) 300 MG capsule Take 300 mg by mouth 2 (two) times daily.        . fexofenadine (ALLEGRA) 180 MG tablet Take 180 mg by mouth daily.        . hepatitis A-hepatitis B (TWINRIX) 720-20 ELU-MCG/ML injection Inject 1 mL into the muscle.          Allergies as of 04/13/2011 - Review Complete 04/13/2011  Allergen Reaction Noted  . Hydrocodone      History   Social History  . Marital Status: Married    Spouse Name: [name]    Number of Children:  [name]  . Years of Education: [name]   Social History Main Topics  . Smoking status: Current Everyday Smoker -- 1.5 packs/day    Types: Cigarettes  . Smokeless tobacco: None  . Alcohol Use: Yes     maybe once a week  . Drug Use: No  . Sexually Active: None    Review of Systems: Gen: Denies fever, chills, anorexia. Denies fatigue, weakness, weight loss.  CV: Denies chest pain, palpitations, syncope, peripheral edema, and claudication. Resp: Denies dyspnea at rest, cough, wheezing, coughing up blood, and pleurisy. GI: Denies vomiting blood, jaundice, and fecal incontinence.   Denies dysphagia or odynophagia. Derm: Denies rash, itching, dry skin Psych: Denies depression, anxiety, memory loss, confusion. No homicidal or suicidal ideation.  Heme: Denies bruising, bleeding, and enlarged lymph nodes.  Physical Exam: BP 140/83  Pulse 78  Temp(Src) 98 F (36.7 C) (Temporal)  Ht 6' (1.829 m)  Wt 202 lb 3.2 oz (91.717 kg)  BMI 27.42 kg/m2 General:   Alert and oriented. No distress noted. Pleasant and cooperative.  Head:  Normocephalic and atraumatic. Eyes:  Conjuctiva clear without scleral icterus. Mouth:  Oral mucosa pink  and moist. Good dentition. No lesions. Neck:  Supple, without mass or thyromegaly. Heart:  S1, S2 present without murmurs, rubs, or gallops. Regular rate and rhythm. Abdomen:  +BS, soft, non-tender and non-distended. No rebound or guarding. No HSM or masses noted. Msk:  Symmetrical without gross deformities. Normal posture. Extremities:  Without edema. Neurologic:  Alert and  oriented x4;  grossly normal neurologically. Skin:  Intact without significant lesions or rashes. Cervical Nodes:  No significant cervical adenopathy. Psych:  Alert and cooperative. Normal mood and affect.

## 2011-04-13 NOTE — Progress Notes (Unsigned)
  As mentioned in OV note, pt is not receiving appropriate dose of Urso. Needs to be ideally at 1200 mg per day due to wt.   Please ask pt to try taking urso 300 mg at breakfast, 300 at lunch, 600 in evening. If he is able to do 600 in morning instead of breaking it up, that is fine, too. Had issues with nausea in the past. May be a bit of tweaking the dosing, but it is important he does this.  Also, make sure he has a bone density scan set up for this year. Finally, needs Hep A and B vaccines. I talked with him about this, but he is pursuing on his own. Just a friendly reminder to him would be great.   Thanks

## 2011-04-20 ENCOUNTER — Other Ambulatory Visit: Payer: Self-pay | Admitting: Gastroenterology

## 2011-04-20 DIAGNOSIS — K743 Primary biliary cirrhosis: Secondary | ICD-10-CM

## 2011-04-20 NOTE — Progress Notes (Signed)
Have been unable to reach pt, mailed letter. Please schedule bone density.

## 2011-04-20 NOTE — Progress Notes (Signed)
Called patient made aware of bone density scan on 04/24/11 @ 9:45 am. Patient said that would be fine.

## 2011-04-20 NOTE — Progress Notes (Signed)
Called and LMOM to call by

## 2011-04-24 ENCOUNTER — Ambulatory Visit (HOSPITAL_COMMUNITY)
Admission: RE | Admit: 2011-04-24 | Discharge: 2011-04-24 | Disposition: A | Discharge status: Home / Self Care | Payer: 59 | Source: Ambulatory Visit | Attending: Gastroenterology | Admitting: Gastroenterology

## 2011-04-24 DIAGNOSIS — Z13820 Encounter for screening for osteoporosis: Secondary | ICD-10-CM | POA: Insufficient documentation

## 2011-04-24 DIAGNOSIS — K743 Primary biliary cirrhosis: Secondary | ICD-10-CM

## 2011-05-25 ENCOUNTER — Other Ambulatory Visit: Payer: Self-pay | Admitting: Gastroenterology

## 2011-05-25 DIAGNOSIS — K743 Primary biliary cirrhosis: Secondary | ICD-10-CM

## 2011-09-18 ENCOUNTER — Other Ambulatory Visit: Payer: Self-pay | Admitting: Gastroenterology

## 2011-09-19 LAB — HEPATIC FUNCTION PANEL
AST: 22 U/L (ref 0–37)
Bilirubin, Direct: 0.1 mg/dL (ref 0.0–0.3)
Indirect Bilirubin: 0.6 mg/dL (ref 0.0–0.9)
Total Bilirubin: 0.7 mg/dL (ref 0.3–1.2)

## 2011-09-26 NOTE — Progress Notes (Signed)
Quick Note:  This looks great. Repeat in 6 months when he comes for his 1 year visit. ______

## 2011-09-26 NOTE — Progress Notes (Signed)
Quick Note:  Tried to call pt- NA ______ 

## 2011-09-27 ENCOUNTER — Other Ambulatory Visit: Payer: Self-pay | Admitting: Gastroenterology

## 2011-09-27 DIAGNOSIS — K743 Primary biliary cirrhosis: Secondary | ICD-10-CM

## 2011-09-27 NOTE — Progress Notes (Signed)
Quick Note:  Letter mailed to pt, lab order on file. ______

## 2012-02-26 ENCOUNTER — Other Ambulatory Visit: Payer: Self-pay

## 2012-02-26 DIAGNOSIS — K743 Primary biliary cirrhosis: Secondary | ICD-10-CM

## 2012-03-13 LAB — HEPATIC FUNCTION PANEL
ALT: 12 U/L (ref 0–53)
AST: 20 U/L (ref 0–37)
Albumin: 3.8 g/dL (ref 3.5–5.2)
Alkaline Phosphatase: 62 U/L (ref 39–117)
Bilirubin, Direct: 0.1 mg/dL (ref 0.0–0.3)
Indirect Bilirubin: 0.5 mg/dL (ref 0.0–0.9)
Total Bilirubin: 0.6 mg/dL (ref 0.3–1.2)
Total Protein: 7.5 g/dL (ref 6.0–8.3)

## 2012-04-05 ENCOUNTER — Ambulatory Visit (INDEPENDENT_AMBULATORY_CARE_PROVIDER_SITE_OTHER): Payer: 59 | Admitting: Internal Medicine

## 2012-04-05 ENCOUNTER — Encounter: Payer: Self-pay | Admitting: Internal Medicine

## 2012-04-05 ENCOUNTER — Other Ambulatory Visit: Payer: Self-pay

## 2012-04-05 VITALS — BP 148/87 | HR 57 | Temp 97.2°F | Ht 73.0 in | Wt 201.2 lb

## 2012-04-05 DIAGNOSIS — K745 Biliary cirrhosis, unspecified: Secondary | ICD-10-CM

## 2012-04-05 DIAGNOSIS — K769 Liver disease, unspecified: Secondary | ICD-10-CM

## 2012-04-05 DIAGNOSIS — K743 Primary biliary cirrhosis: Secondary | ICD-10-CM

## 2012-04-05 NOTE — Patient Instructions (Addendum)
Resume Jeffrey Gutierrez - it is very important  Liver ultrasound and AFP every 6 months  Please get vaccinated against Hepatitis A and B  Office visit here in 6 months

## 2012-04-05 NOTE — Progress Notes (Signed)
Primary Care Physician:  Colette Ribas, MD Primary Gastroenterologist:  Dr. Jena Gauss  Pre-Procedure History & Physical: HPI:  Jeffrey Gutierrez is a 56 y.o. male here for followup primary biliary cirrhosis. Prior biopsy not inconsistent with PBC; there were some autoimmune treat features and some features of a Nash. Given serological markers and a tertiary consultation with Dr. Jacqualine Mau, it is felt patient does have primary biliary cirrhosis. His most recent LFTs are normal. In fact, his LFTs are normal going back at least 3 years. He's not having any itching. He is really doing very well. No alcohol consumption. Continues to smoke 1-1/2 packs daily. He has not followed through on vaccination against hepatitis A and B. He is also stopped taking Urso as he didn't think it was making much difference one way or the other. He cited some reflux symptoms with Urso. Bone density study one year ago normal. Negative colonoscopy 2009. Due for average risk screening 2019. 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 Past Medical History  Diagnosis Date  . PBC (primary biliary cirrhosis)     diagnosed 2006  . S/P colonoscopy 2009    pancolonic diverticula, hyperplastic rectal polyps  . Hyperplastic rectal polyp     Past Surgical History  Procedure Date  . Cholecystectomy   . Liver biopsy   . Nasal septum surgery   . Arm surgery   . Inguinal hernia repair Feb 2012    Dr. Lovell Sheehan   . Colonoscopy 2009    pancolonic diverticula, hyperplastic rectal polyps    Prior to Admission medications   Medication Sig Start Date End Date Taking? Authorizing Provider  aspirin 81 MG tablet Take 81 mg by mouth daily.     Yes Historical Provider, MD  fexofenadine (ALLEGRA) 180 MG tablet Take 180 mg by mouth daily.     Yes Historical Provider, MD  fish oil-omega-3 fatty acids 1000 MG capsule Take 2 g by mouth daily.     Yes Historical Provider, MD  Multiple Vitamin (MULTIVITAMIN) capsule Take 1 capsule by mouth daily.    Yes Historical Provider, MD  hepatitis A-hepatitis B (TWINRIX) 720-20 ELU-MCG/ML injection Inject 1 mL into the muscle.      Historical Provider, MD  ibuprofen (ADVIL,MOTRIN) 200 MG tablet Take 200 mg by mouth every 6 (six) hours as needed.      Historical Provider, MD  ursodiol (ACTIGALL) 300 MG capsule Take 300 mg by mouth 2 (two) times daily.      Historical Provider, MD    Allergies as of 04/05/2012 - Review Complete 04/05/2012  Allergen Reaction Noted  . Hydrocodone Other (See Comments)     No family history on file.  History   Social History  . Marital Status: Married    Spouse Name: [name]    Number of Children: [name]  . Years of Education: [name]   Occupational History  . Not on file.   Social History Main Topics  . Smoking status: Current Everyday Smoker -- 1.5 packs/day    Types: Cigarettes  . Smokeless tobacco: Not on file  . Alcohol Use: Yes     maybe once a week  . Drug Use: No  . Sexually Active: Not on file   Other Topics Concern  . Not on file   Social History Narrative  . No narrative on file    Review of Systems: See HPI, otherwise negative ROS  Physical Exam: BP 148/87  Pulse 57  Temp 97.2 F (36.2 C) (Temporal)  Ht  6\' 1"  (1.854 m)  Wt 201 lb 3.2 oz (91.264 kg)  BMI 26.55 kg/m2 General:   Alert,  Well-developed, well-nourished, pleasant and cooperative in NAD Skin:  Intact without significant lesions or rashes. Eyes:  Sclera clear, no icterus.   Conjunctiva pink. Ears:  Normal auditory acuity. Nose:  No deformity, discharge,  or lesions. Mouth:  No deformity or lesions. Neck:  Supple; no masses or thyromegaly. No significant cervical adenopathy. Lungs:  Clear throughout to auscultation.   No wheezes, crackles, or rhonchi. No acute distress. Heart:  Regular rate and rhythm; no murmurs, clicks, rubs,  or gallops. Abdomen: Non-distended, normal bowel sounds.  Soft and nontender without appreciable mass or hepatosplenomegaly.  Pulses:  Normal pulses  noted. Extremities:  Without clubbing or edema.  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 Clinically doing very well with normal LFTs.. I'm concerned that he has not followed through on his vaccinations and he has come off Urso. I discussed the importance of these 2 treatments in the setting of PBC and bridging fibrosis on liver biopsy part also and I discussed his increased risk for hepatoma and needs to have an ultrasound and alpha-fetoprotein every 6 months  Also recommended smoking cessation.  Resume Jeffrey Gutierrez - it is very important  Liver ultrasound and AFP every 6 months  Please get vaccinated against Hepatitis A and B  Office visit here in 6 months

## 2012-04-08 NOTE — Progress Notes (Signed)
Quick Note:  Noted. Seen in office by RMR. Repeat in 6 months. ______

## 2012-04-09 ENCOUNTER — Other Ambulatory Visit: Payer: Self-pay | Admitting: Internal Medicine

## 2012-04-09 ENCOUNTER — Ambulatory Visit (HOSPITAL_COMMUNITY)
Admission: RE | Admit: 2012-04-09 | Discharge: 2012-04-09 | Disposition: A | Discharge status: Home / Self Care | Payer: 59 | Source: Ambulatory Visit | Attending: Internal Medicine | Admitting: Internal Medicine

## 2012-04-09 DIAGNOSIS — K745 Biliary cirrhosis, unspecified: Secondary | ICD-10-CM | POA: Insufficient documentation

## 2012-04-09 DIAGNOSIS — K743 Primary biliary cirrhosis: Secondary | ICD-10-CM

## 2012-04-09 LAB — AFP TUMOR MARKER: AFP-Tumor Marker: 5.2 ng/mL (ref 0.0–8.0)

## 2012-04-17 ENCOUNTER — Encounter: Payer: Self-pay | Admitting: Internal Medicine

## 2012-04-23 NOTE — Progress Notes (Signed)
Quick Note:  Spoke with radiologist. Unable to speak with Dr. Chestine Spore, but radiologist did state the spleen is enlarged. Likely voice dictation issue. Addendum to be added in future.    ______

## 2012-04-30 ENCOUNTER — Other Ambulatory Visit: Payer: Self-pay | Admitting: Internal Medicine

## 2012-04-30 DIAGNOSIS — K743 Primary biliary cirrhosis: Secondary | ICD-10-CM

## 2012-09-17 ENCOUNTER — Other Ambulatory Visit: Payer: Self-pay | Admitting: Internal Medicine

## 2012-09-17 ENCOUNTER — Telehealth: Payer: Self-pay | Admitting: Internal Medicine

## 2012-09-17 ENCOUNTER — Telehealth: Payer: Self-pay | Admitting: *Deleted

## 2012-09-17 NOTE — Telephone Encounter (Signed)
Message copied by Glendora Score on Tue Sep 17, 2012  2:25 PM ------      Message from: Jeffrey Gutierrez      Created: Mon Sep 16, 2012 11:32 AM       This pt is on the recall list for an ultrasound for Feb 2014.  Thanks.

## 2012-09-17 NOTE — Telephone Encounter (Signed)
I mailed patient a letter to contact me to schedule

## 2012-09-17 NOTE — Telephone Encounter (Signed)
Pt on the Feb recall list for ULTRASOUND IN 6 MONTHS

## 2012-09-17 NOTE — Telephone Encounter (Signed)
Correction: pt on the Feb 2014 recall list for Ultrasound in 6 months

## 2012-10-07 ENCOUNTER — Other Ambulatory Visit: Payer: Self-pay

## 2012-10-07 DIAGNOSIS — K743 Primary biliary cirrhosis: Secondary | ICD-10-CM

## 2012-10-08 ENCOUNTER — Encounter: Payer: Self-pay | Admitting: Internal Medicine

## 2012-10-08 NOTE — Telephone Encounter (Signed)
Letter has been mailed to the patient to contact our office an ask for the Referral Coordinator to be scheduled

## 2012-10-22 ENCOUNTER — Telehealth: Payer: Self-pay | Admitting: Internal Medicine

## 2012-10-22 NOTE — Telephone Encounter (Signed)
Jeffrey Gutierrez came into the office because we mailed him a letter stating that he needed a 6 month f/u u/s and he stated that his insurance had changed and that he is still paying on the one he did back 6 month ago and he cant afford to do another one right now

## 2012-10-23 LAB — AFP TUMOR MARKER: AFP-Tumor Marker: 4.7 ng/mL (ref 0.0–8.0)

## 2012-10-23 NOTE — Telephone Encounter (Signed)
OK. we have made recommendations. Patient to call and let us know when he is ready to have it done.

## 2013-12-13 ENCOUNTER — Emergency Department (HOSPITAL_COMMUNITY): Payer: 59

## 2013-12-13 ENCOUNTER — Encounter (HOSPITAL_COMMUNITY): Payer: Self-pay | Admitting: Radiology

## 2013-12-13 ENCOUNTER — Emergency Department (HOSPITAL_COMMUNITY)
Admission: EM | Admit: 2013-12-13 | Discharge: 2013-12-13 | Disposition: A | Discharge status: Home / Self Care | Payer: 59 | Attending: Emergency Medicine | Admitting: Emergency Medicine

## 2013-12-13 DIAGNOSIS — R062 Wheezing: Secondary | ICD-10-CM | POA: Insufficient documentation

## 2013-12-13 DIAGNOSIS — Z8719 Personal history of other diseases of the digestive system: Secondary | ICD-10-CM | POA: Insufficient documentation

## 2013-12-13 DIAGNOSIS — F17200 Nicotine dependence, unspecified, uncomplicated: Secondary | ICD-10-CM | POA: Insufficient documentation

## 2013-12-13 DIAGNOSIS — R0789 Other chest pain: Secondary | ICD-10-CM | POA: Insufficient documentation

## 2013-12-13 DIAGNOSIS — R0602 Shortness of breath: Secondary | ICD-10-CM

## 2013-12-13 DIAGNOSIS — Z79899 Other long term (current) drug therapy: Secondary | ICD-10-CM | POA: Insufficient documentation

## 2013-12-13 DIAGNOSIS — Z7982 Long term (current) use of aspirin: Secondary | ICD-10-CM | POA: Insufficient documentation

## 2013-12-13 LAB — CBC WITH DIFFERENTIAL/PLATELET
BASOS PCT: 1 % (ref 0–1)
Basophils Absolute: 0 10*3/uL (ref 0.0–0.1)
EOS ABS: 0 10*3/uL (ref 0.0–0.7)
Eosinophils Relative: 1 % (ref 0–5)
HEMATOCRIT: 45 % (ref 39.0–52.0)
Hemoglobin: 15.4 g/dL (ref 13.0–17.0)
LYMPHS ABS: 1.3 10*3/uL (ref 0.7–4.0)
Lymphocytes Relative: 34 % (ref 12–46)
MCH: 29.6 pg (ref 26.0–34.0)
MCHC: 34.2 g/dL (ref 30.0–36.0)
MCV: 86.4 fL (ref 78.0–100.0)
MONO ABS: 0.3 10*3/uL (ref 0.1–1.0)
Monocytes Relative: 7 % (ref 3–12)
NEUTROS ABS: 2.3 10*3/uL (ref 1.7–7.7)
Neutrophils Relative %: 57 % (ref 43–77)
Platelets: 95 10*3/uL — ABNORMAL LOW (ref 150–400)
RBC: 5.21 MIL/uL (ref 4.22–5.81)
RDW: 13.3 % (ref 11.5–15.5)
WBC: 3.9 10*3/uL — ABNORMAL LOW (ref 4.0–10.5)

## 2013-12-13 LAB — I-STAT TROPONIN, ED
Troponin i, poc: 0 ng/mL (ref 0.00–0.08)
Troponin i, poc: 0.01 ng/mL (ref 0.00–0.08)

## 2013-12-13 LAB — BASIC METABOLIC PANEL
BUN: 6 mg/dL (ref 6–23)
CALCIUM: 8.7 mg/dL (ref 8.4–10.5)
CO2: 24 meq/L (ref 19–32)
CREATININE: 0.81 mg/dL (ref 0.50–1.35)
Chloride: 94 mEq/L — ABNORMAL LOW (ref 96–112)
GFR calc Af Amer: >90 mL/min (ref >90)
GFR calc non Af Amer: >90 mL/min (ref >90)
GLUCOSE: 205 mg/dL — AB (ref 70–99)
Potassium: 4.1 mEq/L (ref 3.7–5.3)
SODIUM: 130 meq/L — AB (ref 137–147)

## 2013-12-13 MED ORDER — IOHEXOL 350 MG/ML SOLN
100.0000 mL | Freq: Once | INTRAVENOUS | Status: AC | PRN
  Administered 2013-12-13: 100 mL via INTRAVENOUS

## 2013-12-13 MED ORDER — ASPIRIN 81 MG PO CHEW
324.0000 mg | CHEWABLE_TABLET | Freq: Once | ORAL | Status: AC
  Administered 2013-12-13: 324 mg via ORAL
  Filled 2013-12-13: qty 4

## 2013-12-13 MED ORDER — ALBUTEROL SULFATE HFA 108 (90 BASE) MCG/ACT IN AERS
2.0000 | INHALATION_SPRAY | RESPIRATORY_TRACT | Status: DC
  Administered 2013-12-13: 2 via RESPIRATORY_TRACT
  Filled 2013-12-13: qty 6.7

## 2013-12-13 MED ORDER — ALBUTEROL SULFATE (2.5 MG/3ML) 0.083% IN NEBU
5.0000 mg | INHALATION_SOLUTION | Freq: Once | RESPIRATORY_TRACT | Status: AC
  Administered 2013-12-13: 5 mg via RESPIRATORY_TRACT
  Filled 2013-12-13: qty 6

## 2013-12-13 MED ORDER — IPRATROPIUM BROMIDE 0.02 % IN SOLN
0.5000 mg | Freq: Once | RESPIRATORY_TRACT | Status: AC
  Administered 2013-12-13: 0.5 mg via RESPIRATORY_TRACT
  Filled 2013-12-13: qty 2.5

## 2013-12-13 NOTE — ED Provider Notes (Signed)
CSN: [id number]     Arrival date & time 12/13/13  1617 History   First MD Initiated Contact with Patient 12/13/13 1619     Chief Complaint  Patient presents with  . Shortness of Breath  . Chest Pain     (Consider location/radiation/quality/duration/timing/severity/associated sxs/prior Treatment) HPI Comments: Patient presents emergency department with chief complaint of chest tightness and shortness of breath that started 2 hours ago. He states that it is improved.  Currently, states that his pain is mild. He denies any heart history. Cardiac risk factors include age and smoking history. He has not taken anything to alleviate his symptoms. The pain does not radiate. There are no aggravating or alleviating factors.  The history is provided by the patient. No language interpreter was used.    Past Medical History  Diagnosis Date  . PBC (primary biliary cirrhosis)     diagnosed 2006  . S/P colonoscopy 2009    pancolonic diverticula, hyperplastic rectal polyps  . Hyperplastic rectal polyp    Past Surgical History  Procedure Laterality Date  . Cholecystectomy    . Liver biopsy    . Nasal septum surgery    . Arm surgery    . Inguinal hernia repair  Feb 2012    Dr. Arnoldo Morale   . Colonoscopy  2009    pancolonic diverticula, hyperplastic rectal polyps   No family history on file. History  Substance Use Topics  . Smoking status: Current Every Day Smoker -- 1.50 packs/day    Types: Cigarettes  . Smokeless tobacco: Not on file  . Alcohol Use: Yes     Comment: maybe once a week    Review of Systems  All other systems reviewed and are negative.      Allergies  Hydrocodone  Home Medications   Current Outpatient Rx  Name  Route  Sig  Dispense  Refill  . aspirin 81 MG tablet   Oral   Take 81 mg by mouth daily.           . fexofenadine (ALLEGRA) 180 MG tablet   Oral   Take 180 mg by mouth daily.           . fish oil-omega-3 fatty acids 1000 MG capsule   Oral  Take 2 g by mouth daily.           . hepatitis A-hepatitis B (TWINRIX) 720-20 ELU-MCG/ML injection   Intramuscular   Inject 1 mL into the muscle.           . ibuprofen (ADVIL,MOTRIN) 200 MG tablet   Oral   Take 200 mg by mouth every 6 (six) hours as needed.           . Multiple Vitamin (MULTIVITAMIN) capsule   Oral   Take 1 capsule by mouth daily.         . ursodiol (ACTIGALL) 300 MG capsule   Oral   Take 300 mg by mouth 2 (two) times daily.            Pulse 90  Temp(Src) 98 F (36.7 C) (Oral)  Resp 18  Ht 6\' 1"  (1.854 m)  Wt 205 lb (92.987 kg)  BMI 27.05 kg/m2  SpO2 89% Physical Exam  Nursing note and vitals reviewed. Constitutional: He is oriented to person, place, and time. He appears well-developed and well-nourished.  HENT:  Head: Normocephalic and atraumatic.  Eyes: Conjunctivae and EOM are normal. Pupils are equal, round, and reactive to light. Right eye exhibits no discharge.  Left eye exhibits no discharge. No scleral icterus.  Neck: Normal range of motion. Neck supple. No JVD present.  Cardiovascular: Normal rate, regular rhythm and normal heart sounds.  Exam reveals no gallop and no friction rub.   No murmur heard. Pulmonary/Chest: Effort normal. No respiratory distress. He has wheezes. He has no rales. He exhibits no tenderness.  Bilateral end expiratory wheezes  Abdominal: Soft. He exhibits no distension and no mass. There is no tenderness. There is no rebound and no guarding.  Musculoskeletal: Normal range of motion. He exhibits no edema and no tenderness.  Neurological: He is alert and oriented to person, place, and time.  Skin: Skin is warm and dry.  Psychiatric: He has a normal mood and affect. His behavior is normal. Judgment and thought content normal.    ED Course  Procedures (including critical care time) Results for orders placed during the hospital encounter of 12/13/13  CBC WITH DIFFERENTIAL      Result Value Ref Range   WBC 3.9 (*)  4.0 - 10.5 K/uL   RBC 5.21  4.22 - 5.81 MIL/uL   Hemoglobin 15.4  13.0 - 17.0 g/dL   HCT 45.0  39.0 - 52.0 %   MCV 86.4  78.0 - 100.0 fL   MCH 29.6  26.0 - 34.0 pg   MCHC 34.2  30.0 - 36.0 g/dL   RDW 13.3  11.5 - 15.5 %   Platelets 95 (*) 150 - 400 K/uL   Neutrophils Relative % 57  43 - 77 %   Lymphocytes Relative 34  12 - 46 %   Monocytes Relative 7  3 - 12 %   Eosinophils Relative 1  0 - 5 %   Basophils Relative 1  0 - 1 %   Neutro Abs 2.3  1.7 - 7.7 K/uL   Lymphs Abs 1.3  0.7 - 4.0 K/uL   Monocytes Absolute 0.3  0.1 - 1.0 K/uL   Eosinophils Absolute 0.0  0.0 - 0.7 K/uL   Basophils Absolute 0.0  0.0 - 0.1 K/uL   Smear Review PLATELET COUNT CONFIRMED BY SMEAR    BASIC METABOLIC PANEL      Result Value Ref Range   Sodium 130 (*) 137 - 147 mEq/L   Potassium 4.1  3.7 - 5.3 mEq/L   Chloride 94 (*) 96 - 112 mEq/L   CO2 24  19 - 32 mEq/L   Glucose, Bld 205 (*) 70 - 99 mg/dL   BUN 6  6 - 23 mg/dL   Creatinine, Ser 0.81  0.50 - 1.35 mg/dL   Calcium 8.7  8.4 - 10.5 mg/dL   GFR calc non Af Amer >90  >90 mL/min   GFR calc Af Amer >90  >90 mL/min  I-STAT TROPOININ, ED      Result Value Ref Range   Troponin i, poc 0.00  0.00 - 0.08 ng/mL   Comment 3           I-STAT TROPOININ, ED      Result Value Ref Range   Troponin i, poc 0.01  0.00 - 0.08 ng/mL   Comment 3            Dg Chest 2 View  12/13/2013   CLINICAL DATA:  Shortness of breath.  Smoker.  EXAM: CHEST  2 VIEW  COMPARISON:  06/02/2008.  FINDINGS: Normal sized heart. Clear lungs. The lungs remain mildly hyperexpanded with mild diffuse peribronchial thickening and accentuation of the interstitial markings. Mild scoliosis.  IMPRESSION: No acute abnormality. Stable mild changes of COPD and chronic bronchitis.   Electronically Signed   By: Enrique Sack M.D.   On: 12/13/2013 17:52   Ct Angio Chest Pe W/cm &/or Wo Cm  12/13/2013   CLINICAL DATA:  Shortness of breath, left chest tightness, evaluate for PE  EXAM: CT ANGIOGRAPHY CHEST  WITH CONTRAST  TECHNIQUE: Multidetector CT imaging of the chest was performed using the standard protocol during bolus administration of intravenous contrast. Multiplanar CT image reconstructions and MIPs were obtained to evaluate the vascular anatomy.  CONTRAST:  120mL OMNIPAQUE IOHEXOL 350 MG/ML SOLN  COMPARISON:  Chest radiographs dated 12/13/2013  FINDINGS: No evidence of pulmonary embolism.  Moderate centrilobular and paraseptal emphysematous changes. No suspicious pulmonary nodules. Minimal dependent atelectasis the bilateral lung bases. No pleural effusion or pneumothorax.  Visualized thyroid is unremarkable.  Heart is normal in size.  No pericardial effusion.  Small mediastinal lymph nodes measuring up to 7 mm short axis, within normal limits. No suspicious axillary lymphadenopathy.  Visualized upper abdomen is unremarkable.  Visualized osseous structures are within normal limits.  Review of the MIP images confirms the above findings.  IMPRESSION: No evidence of pulmonary embolism.  No evidence of acute cardiopulmonary disease.  Moderate emphysematous changes.   Electronically Signed   By: Julian Hy M.D.   On: 12/13/2013 19:17    Imaging Review No results found.   EKG Interpretation   Date/Time:  Saturday December 13 2013 16:24:34 EDT Ventricular Rate:  93 PR Interval:    QRS Duration: 105 QT Interval:  351 QTC Calculation: 436 R Axis:   -175 Text Interpretation:  nsr vs. ectopic Consider right ventricular  hypertrophy No previous tracing Confirmed by POLLINA  MD, CHRISTOPHER  ([id number]) on 12/13/2013 4:51:40 PM      MDM   Final diagnoses:  SOB (shortness of breath)    Chest tightness and shortness of breath. Will check basic labs, and will reevaluate.  Patient re-evaluated several times.  His O2 sat remains about 89-92%, but improves to 95% with ambulation.  We have switched monitors, but no explanation for the desat at rest.  Patient seen by and discussed with Dr. Betsey Holiday.   We advised the patient to stay for further observation and breathing treatments, but the patient declines.  Dr. Betsey Holiday discussed this with the patient, and patient wishes to go home.  Will give strict return precautions.  2 troponins are negative and EKGs are unremarkable.  He does have some wheezing on exam, will give inhaler for home use.  Recommend PCP follow-up.    Montine Circle, PA-C 12/13/13 2105

## 2013-12-13 NOTE — ED Notes (Signed)
Pt c/o SOB and left side chest tightness x 2 hrs. Denies nausea, weakness, dizziness, or any other sx. Pt states he was walking when sx started. Sx alleviated with rest.

## 2013-12-13 NOTE — ED Notes (Signed)
Patient is alert and oriented x3.  He was given DC instructions and follow up visit instructions.  Patient gave verbal understanding.  He was DC ambulatory under his own power to home.  V/S stable.  He was not showing any signs of distress on DC 

## 2013-12-13 NOTE — Discharge Instructions (Signed)

## 2013-12-13 NOTE — ED Notes (Signed)
Pt ambulated with out any shortness of breath....resting 89% hr 78 walking 92-94% hr 91 Rn notifited

## 2013-12-17 NOTE — ED Provider Notes (Signed)
Medical screening examination/treatment/procedure(s) were performed by non-physician practitioner and as supervising physician I was immediately available for consultation/collaboration.   EKG Interpretation   Date/Time:  Saturday December 13 2013 19:33:45 EDT Ventricular Rate:  76 PR Interval:  159 QRS Duration: 108 QT Interval:  397 QTC Calculation: 446 R Axis:   125 Text Interpretation:  Sinus rhythm Supraventricular bigeminy Consider  right ventricular hypertrophy Minimal ST elevation, anterior leads ED  PHYSICIAN INTERPRETATION AVAILABLE IN [facility] Confirmed by TEST,  Record ([id number]) on 12/15/2013 6:44:20 AM        Orpah Greek, MD 12/17/13 1517

## 2015-02-04 ENCOUNTER — Ambulatory Visit (INDEPENDENT_AMBULATORY_CARE_PROVIDER_SITE_OTHER): Payer: 59 | Admitting: Nurse Practitioner

## 2015-02-04 ENCOUNTER — Encounter (INDEPENDENT_AMBULATORY_CARE_PROVIDER_SITE_OTHER): Payer: Self-pay

## 2015-02-04 ENCOUNTER — Encounter: Payer: Self-pay | Admitting: Nurse Practitioner

## 2015-02-04 VITALS — BP 160/91 | HR 78 | Temp 97.4°F | Ht 73.0 in | Wt 198.4 lb

## 2015-02-04 DIAGNOSIS — K745 Biliary cirrhosis, unspecified: Secondary | ICD-10-CM | POA: Diagnosis not present

## 2015-02-04 DIAGNOSIS — K5900 Constipation, unspecified: Secondary | ICD-10-CM | POA: Diagnosis not present

## 2015-02-04 LAB — CBC WITH DIFFERENTIAL/PLATELET
Basophils Absolute: 0 10*3/uL (ref 0.0–0.1)
Basophils Relative: 1 % (ref 0–1)
EOS PCT: 1 % (ref 0–5)
Eosinophils Absolute: 0 10*3/uL (ref 0.0–0.7)
HEMATOCRIT: 45.5 % (ref 39.0–52.0)
Hemoglobin: 15.3 g/dL (ref 13.0–17.0)
LYMPHS ABS: 2 10*3/uL (ref 0.7–4.0)
Lymphocytes Relative: 48 % — ABNORMAL HIGH (ref 12–46)
MCH: 29 pg (ref 26.0–34.0)
MCHC: 33.6 g/dL (ref 30.0–36.0)
MCV: 86.3 fL (ref 78.0–100.0)
MONOS PCT: 9 % (ref 3–12)
MPV: 9.1 fL (ref 8.6–12.4)
Monocytes Absolute: 0.4 10*3/uL (ref 0.1–1.0)
Neutro Abs: 1.7 10*3/uL (ref 1.7–7.7)
Neutrophils Relative %: 41 % — ABNORMAL LOW (ref 43–77)
Platelets: 149 10*3/uL — ABNORMAL LOW (ref 150–400)
RBC: 5.27 MIL/uL (ref 4.22–5.81)
RDW: 14.6 % (ref 11.5–15.5)
WBC: 4.1 10*3/uL (ref 4.0–10.5)

## 2015-02-04 LAB — COMPREHENSIVE METABOLIC PANEL
ALT: 23 U/L (ref 0–53)
AST: 30 U/L (ref 0–37)
Albumin: 3.5 g/dL (ref 3.5–5.2)
Alkaline Phosphatase: 75 U/L (ref 39–117)
BUN: 9 mg/dL (ref 6–23)
CO2: 32 meq/L (ref 19–32)
Calcium: 8.6 mg/dL (ref 8.4–10.5)
Chloride: 95 mEq/L — ABNORMAL LOW (ref 96–112)
Creat: 0.86 mg/dL (ref 0.50–1.35)
Glucose, Bld: 149 mg/dL — ABNORMAL HIGH (ref 70–99)
Potassium: 4.2 mEq/L (ref 3.5–5.3)
Sodium: 134 mEq/L — ABNORMAL LOW (ref 135–145)
Total Bilirubin: 0.5 mg/dL (ref 0.2–1.2)
Total Protein: 7.7 g/dL (ref 6.0–8.3)

## 2015-02-04 NOTE — Progress Notes (Signed)
Referring Provider: Sharilyn Sites, MD Primary Care Physician:  Purvis Kilts, MD Primary GI:  Dr. Gala Romney  Chief Complaint  Patient presents with  . Constipation    HPI:   59 year old male with a history of primary biliary cirrhosis who is been noncompliant with treatments. Last office visit 04/05/2012 at which point patient stated he stopped taking Ursodiol and did not follow through with his vaccines on hepatitis A and hepatitis B. Was recommended he restart the medications, receive his vaccines, and return in 6 months for routine labs and abdominal ultrasound. 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 The patient did not show for follow-up appointments and when he was sent a letter regarding his ultrasound stayed could not afford to do so at this time. Patient has not about our office since. Today he reports on referral from PCP for constipation. PCP notes reviewed. Per PCP notes patient with 4 weeks of constipation symptoms are getting worse and are moderate in severity. Was given Linzess and Amitiza samples trial.  Today he states his constipation has been going on for 2-3 months. Cannot think of any changes at that time that could have precipitated his symptoms. Currently has a bowel movement about every 2-3 days. Before this he was going about twice a day. Admits abdominal pain primarily right sided, unable to qualify "it just hurts." Went to urgent care and was told he has diverticulosis. Denies hematochezia, melena. Admits occasional straining. Stools tend to be hard. Yesterday he had diarrhea of moderate volume. Denies fever, chills, unintentional weight loss, energy level is at baseline. Has not had hepatitis vaccines nor ultrasound. Did have CT abdomen a few months ago and told he had diverticulitis and was treated with antibiotics. Tried Amitiza '24mg'$ , which helped for the first week but then stopped. He took it for 10 days total. Did not try the Linzess because it  listed a lot of side effects. Has also tried probiotics with minimal relief. Denies any other upper or lower GI symptoms.  Past Medical History  Diagnosis Date  . PBC (primary biliary cirrhosis)     diagnosed 2006  . S/P colonoscopy 2009    pancolonic diverticula, hyperplastic rectal polyps  . Hyperplastic rectal polyp     Past Surgical History  Procedure Laterality Date  . Cholecystectomy    . Liver biopsy    . Nasal septum surgery    . Arm surgery    . Inguinal hernia repair  Feb 2012    Dr. Arnoldo Morale   . Colonoscopy  2009    pancolonic diverticula, hyperplastic rectal polyps    Current Outpatient Prescriptions  Medication Sig Dispense Refill  . aspirin 81 MG tablet Take 81 mg by mouth every evening.     . Multiple Vitamin (MULTIVITAMIN) capsule Take 1 capsule by mouth every evening.     Marland Kitchen PROAIR HFA 108 (90 BASE) MCG/ACT inhaler   10  . fexofenadine (ALLEGRA) 180 MG tablet Take 180 mg by mouth daily as needed for allergies.     . fish oil-omega-3 fatty acids 1000 MG capsule Take 2 g by mouth every evening.      No current facility-administered medications for this visit.    Allergies as of 02/04/2015 - Review Complete 12/13/2013  Allergen Reaction Noted  . Hydrocodone Anaphylaxis and Other (See Comments)     No family history on file.  History   Social History  . Marital Status: Married    Spouse Name: [name]  .  Number of Children: [name]  . Years of Education: [name]   Social History Main Topics  . Smoking status: Current Every Day Smoker -- 1.50 packs/day    Types: Cigarettes  . Smokeless tobacco: Not on file  . Alcohol Use: Yes     Comment: maybe once a week  . Drug Use: No  . Sexual Activity: Not on file   Other Topics Concern  . None   Social History Narrative    Review of Systems: General: Negative for anorexia, weight loss, fever, chills, fatigue, weakness. Eyes: Negative for vision changes.  ENT: Negative for hoarseness, difficulty swallowing. CV:  Negative for chest pain, angina, palpitations, peripheral edema.  Respiratory: Negative for dyspnea at rest, cough, wheezing.  GI: See history of present illness. Derm: Negative for rash or itching.  Neuro: Negative for weakness, memory loss, confusion.  Endo: Negative for unusual weight change.  Heme: Negative for bruising or bleeding.   Physical Exam: BP 160/91 mmHg  Pulse 78  Temp(Src) 97.4 F (36.3 C) (Oral)  Ht '6\' 1"'$  (1.854 m)  Wt 198 lb 6.4 oz (89.994 kg)  BMI 26.18 kg/m2 General:   Alert and oriented. Pleasant and cooperative. Well-nourished and well-developed.  Head:  Normocephalic and atraumatic. Eyes:  Without icterus, sclera clear and conjunctiva pink.  Ears:  Normal auditory acuity. Cardiovascular:  S1, S2 present without murmurs appreciated. Normal pulses noted. Extremities without clubbing or edema. Respiratory:  Clear to auscultation bilaterally. No wheezes, rales, or rhonchi. No distress.  Gastrointestinal:  +BS, soft, and non-distended. Mild lower abdominal discomfort to palpation. No HSM noted. No guarding or rebound. No masses appreciated.  Rectal:  Deferred  Skin:  No jaundice. Neurologic:  Alert and oriented x4;  grossly normal neurologically. Psych:  Alert and cooperative. Normal mood and affect. Heme/Lymph/Immune: No excessive bruising noted.    02/04/2015 9:38 AM

## 2015-02-04 NOTE — Patient Instructions (Signed)
1. Have your labs and ultrasound done in the next couple days. 2. Tried Linzess sample your primary care provider gave to you. Exline if not effective if he did not tolerate this he can try MiraLAX one cap a day. 3. Increase fiber and water in her diet. Add Benefiber or other fiber supplement to your diet as well. 4. Return for further evaluation in 3 months.

## 2015-02-05 LAB — AFP TUMOR MARKER: AFP-Tumor Marker: 3.9 ng/mL (ref <6.1)

## 2015-02-10 ENCOUNTER — Ambulatory Visit (HOSPITAL_COMMUNITY)
Admission: RE | Admit: 2015-02-10 | Discharge: 2015-02-10 | Disposition: A | Discharge status: Home / Self Care | Payer: 59 | Source: Ambulatory Visit | Attending: Nurse Practitioner | Admitting: Nurse Practitioner

## 2015-02-10 DIAGNOSIS — K745 Biliary cirrhosis, unspecified: Secondary | ICD-10-CM | POA: Insufficient documentation

## 2015-02-11 NOTE — Assessment & Plan Note (Signed)
Patient with a history of primary biliary cirrhosis. He has been generally noncompliant with recommendations for hepatitis vaccination as well as medication shown to be effective in this condition. He did have a CT a few months ago and we'll request these results. Is scheduled to have ultrasound of his abdomen. At this point, since he is here for a visit, we will also proceed with labs including CBC, CMP, and AFP. Return for follow-up in 3 months for routine care.

## 2015-02-11 NOTE — Assessment & Plan Note (Signed)
Patient with persistent constipation symptoms the past 2-3 months. Tried Amitiza 24 g which helped initially but then affect decreased. At this point we'll try Linzess and we'll provide a two-week sample from a trial for effectiveness. If this is not effective he can also add MiraLAX one cap daily. Recommended he increase his fiber and water in his diet as well as a possible fiber supplement. Return for follow-up in 3 months for further evaluation of his symptoms progress. No red flag/warning signs or symptoms at this time.

## 2015-02-11 NOTE — Progress Notes (Signed)
CC'ED TO PCP 

## 2015-02-12 ENCOUNTER — Telehealth: Payer: Self-pay

## 2015-02-12 NOTE — Telephone Encounter (Signed)
Please ask him what dose he was give (290 vs 145). Also, make sure he's taking it on an empty stomach. Taking it with food can worsen diarrhea. Also, diarrhea symptoms tend to be transient and can resolve after being on the med for a couple weeks. If he is on 290 mcg we can try decreasing his dose.

## 2015-02-12 NOTE — Telephone Encounter (Signed)
Pt is calling because the Linzess is giving him the diarrhea. I told him to hold the linzess for now until he hears from [place]. Please advise

## 2015-02-17 NOTE — Telephone Encounter (Signed)
LMOM to call back

## 2015-02-18 NOTE — Telephone Encounter (Signed)
Pt called back and is unsure of what dosage he is taking. Will call us back when he finds out. States he was taking it on an empty stomach

## 2015-02-19 NOTE — Telephone Encounter (Signed)
Pt is aware of lab results and states he is on the 145 mg

## 2015-02-19 NOTE — Telephone Encounter (Signed)
There's a couple options. He can try and ride it out for a couple weeks (as diarrhea on linzess tends to be at the onset and subside over time.) he is on the lowest dose so I can't decrease the dose. If he does not want to do that, he can stop the Linzess and try an OTC regimen. I'd recommend daily Colace stool softener and Miralax 1 capful 3 times a week or more (up to daily) until he reaches an optimal situation.

## 2015-02-22 NOTE — Telephone Encounter (Signed)
Called pt and LMOM to call office back  

## 2015-02-25 NOTE — Telephone Encounter (Signed)
Pt called and states that he has began a probiotic and that seems to be helping

## 2015-02-25 NOTE — Telephone Encounter (Signed)
Called and LMOM 

## 2015-03-10 ENCOUNTER — Other Ambulatory Visit: Payer: Self-pay

## 2015-03-10 ENCOUNTER — Encounter: Payer: Self-pay | Admitting: Gastroenterology

## 2015-03-10 ENCOUNTER — Ambulatory Visit (INDEPENDENT_AMBULATORY_CARE_PROVIDER_SITE_OTHER): Payer: 59 | Admitting: Gastroenterology

## 2015-03-10 VITALS — BP 140/90 | HR 82 | Temp 97.1°F | Ht 73.0 in | Wt 196.6 lb

## 2015-03-10 DIAGNOSIS — K745 Biliary cirrhosis, unspecified: Secondary | ICD-10-CM | POA: Diagnosis not present

## 2015-03-10 DIAGNOSIS — K5909 Other constipation: Secondary | ICD-10-CM | POA: Diagnosis not present

## 2015-03-10 DIAGNOSIS — R198 Other specified symptoms and signs involving the digestive system and abdomen: Secondary | ICD-10-CM

## 2015-03-10 DIAGNOSIS — R194 Change in bowel habit: Secondary | ICD-10-CM | POA: Diagnosis not present

## 2015-03-10 MED ORDER — PEG-KCL-NACL-NASULF-NA ASC-C 100 G PO SOLR: 1.0000 | ORAL | Status: DC

## 2015-03-10 NOTE — Progress Notes (Signed)
Primary Care Physician: Purvis Kilts, MD  Primary Gastroenterologist:  Garfield Cornea, MD   Chief Complaint  Patient presents with  . Follow-up    HPI: Jeffrey Gutierrez is a 59 y.o. male here follow up. Change in bowel habits over the last 2-3 months. Used to have a bowel movement twice daily. Initially evaluated by her practice last month. Patient had already tried and failed Amitiza 24 g twice a day. Took for about 10 days. Tried Linzess 120m daily on empty stomach but had watery diarrhea multiple times daily. He took for 8 days straight this never improved. He finally had to stop taking it. Reports MiraLAX didn't work. Currently taking Dulcolax 1 tablet 1-2 times daily. Denies any abdominal cramping. Bowel movements several times a week on this regimen. Denies blood in the stool or melena. Complains of pressure in the right abdomen, worse with constipation, better after bowel movement. Feels like a balloon blowing up in his abdomen. No heartburn. Denies dysphagia, vomiting, unintentional weight loss.  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 Did not tolerate Urso, developed nausea even when he took at bedtime. Was on lowest dose but still had nausea so he stopped medication is not interested in pursuing further.  Abdominal ultrasound last month showed normal common bile duct diameter. Questionable mild fatty liver. Patient had a CT back in December 2015 that showed uncomplicated diverticulitis. He believes he had a CT within a couple months ago [facility] We will request records soon as patient [place].  Current Outpatient Prescriptions  Medication Sig Dispense Refill  . aspirin 81 MG tablet Take 81 mg by mouth every evening.     . fexofenadine (ALLEGRA) 180 MG tablet Take 180 mg by mouth daily as needed for allergies.     . fish oil-omega-3 fatty acids 1000 MG capsule Take 2 g by mouth every  evening.     . Multiple Vitamin (MULTIVITAMIN) capsule Take 1 capsule by mouth every evening.     .Marland KitchenPROAIR HFA 108 (90 BASE) MCG/ACT inhaler   10   No current facility-administered medications for this visit.    Allergies as of 03/10/2015 - Review Complete 03/10/2015  Allergen Reaction Noted  . Hydrocodone Anaphylaxis and Other (See Comments)    Past Medical History  Diagnosis Date  . PBC (primary biliary cirrhosis)     diagnosed 2006  . S/P colonoscopy 2009    pancolonic diverticula, hyperplastic rectal polyps  . Hyperplastic rectal polyp    Past Surgical History  Procedure Laterality Date  . Cholecystectomy    . Liver biopsy    . Nasal septum surgery    . Arm surgery    . Inguinal hernia repair  Feb 2012    Dr. JArnoldo Morale  . Colonoscopy  2009    pancolonic diverticula, hyperplastic rectal polyps   Family History  Problem Relation Age of Onset  . Pneumonia Father   . Cancer Mother    History   Social History  . Marital Status: Married    Spouse Name: [name]  . Number of Children: [name]  . Years of Education: [name]   Social History Main Topics  . Smoking status: Current Every Day Smoker -- 1.50 packs/day    Types: Cigarettes  . Smokeless tobacco: Not on file  . Alcohol Use: Yes     Comment: maybe once a week  . Drug  Use: No  . Sexual Activity: Not on file   Other Topics Concern  . None   Social History Narrative     ROS:  General: Negative for anorexia, weight loss, fever, chills, fatigue, weakness. Eyes: Negative for vision changes.  ENT: Negative for hoarseness, difficulty swallowing , nasal congestion. CV: Negative for chest pain, angina, palpitations, dyspnea on exertion, peripheral edema.  Respiratory: Negative for dyspnea at rest, dyspnea on exertion, cough, sputum, wheezing.  GI: See history of present illness. GU:  Negative for dysuria, hematuria, urinary incontinence, urinary frequency, nocturnal urination.  MS: Negative for joint pain, low back  pain.  Derm: Negative for rash or itching.  Neuro: Negative for weakness, abnormal sensation, seizure, frequent headaches, memory loss, confusion.  Psych: Negative for anxiety, depression, suicidal ideation, hallucinations.  Endo: Negative for unusual weight change.  Heme: Negative for bruising or bleeding. Allergy: Negative for rash or hives. Physical Examination:   BP 140/90 mmHg  Pulse 82  Temp(Src) 97.1 F (36.2 C)  Ht '6\' 1"'$  (1.854 m)  Wt 196 lb 9.6 oz (89.177 kg)  BMI 25.94 kg/m2  Physical Examination:   General: Well-nourished, well-developed in no acute distress.  Head: Normocephalic, atraumatic.   Eyes: Conjunctiva pink, no icterus. Mouth: Oropharyngeal mucosa moist and pink , no lesions erythema or exudate. Neck: Supple without thyromegaly, masses, or lymphadenopathy.  Lungs: Clear to auscultation bilaterally.  Heart: Regular rate and rhythm, no murmurs rubs or gallops.  Abdomen: Bowel sounds are normal, nontender, nondistended, no hepatosplenomegaly or masses, no abdominal bruits or    hernia , no rebound or guarding.   Extremities: No lower extremity edema.  Neuro: Alert and oriented x 4 , grossly normal neurologically.  Skin: Warm and dry, no rash or jaundice.   Psych: Alert and cooperative, normal mood and affect.   Labs:  Lab Results  Component Value Date   CREATININE 0.86 02/04/2015   BUN 9 02/04/2015   NA 134* 02/04/2015   K 4.2 02/04/2015   CL 95* 02/04/2015   CO2 32 02/04/2015   Lab Results  Component Value Date   ALT 23 02/04/2015   AST 30 02/04/2015   ALKPHOS 75 02/04/2015   BILITOT 0.5 02/04/2015   Lab Results  Component Value Date   WBC 4.1 02/04/2015   HGB 15.3 02/04/2015   HCT 45.5 02/04/2015   MCV 86.3 02/04/2015   PLT 149* 02/04/2015      Imaging Studies: US Abdomen Limited Ruq  22-Feb-2015   CLINICAL DATA:  Primary biliary cirrhosis  EXAM: US ABDOMEN LIMITED - RIGHT UPPER QUADRANT  COMPARISON:  CT scan 09/02/2014  FINDINGS:  Gallbladder:  Surgically absent  Common bile duct:  Diameter: 5 mm in diameter within normal limits.  Liver:  No focal hepatic mass. Subtle mild increased echogenicity of the liver. Mild hepatic fatty infiltration cannot be excluded. No intrahepatic biliary ductal dilatation.  IMPRESSION: 1. Surgically absent gallbladder. Normal CBD. Question mild fatty infiltration of the liver. No focal hepatic mass.   Electronically Signed   By: Lahoma Crocker M.D.   On: 22-Feb-2015 09:10

## 2015-03-10 NOTE — Patient Instructions (Signed)
1. Colonoscopy with Dr. Gala Romney. See separate instructions.  2. Make sure you are having regular bowel movements leading up to your bowel prep. You can continue Dulcolax as before and/or use Linzess if needed.  3. Get the information for the Urgent Care you had the CT done for and we will try to obtain the records for review.

## 2015-03-10 NOTE — Assessment & Plan Note (Addendum)
Patient refuses Urso due to intolerance before. Agrees to ongoing evaluation twice yearly including at minimum of labs and hepatic u/s.  Return to see Dr. Gala Romney in six months.

## 2015-03-10 NOTE — Assessment & Plan Note (Signed)
59 year old gentleman with 2-3 month history of change in bowel function, development of constipation. CT of abdomen and he December 2015 showed acute mild diverticulitis in the distal descending colon. Patient's last colonoscopy was in 2009. Given change in bowel function, consider colonoscopy for further evaluation.  I have discussed the risks, alternatives, benefits with regards to but not limited to the risk of reaction to medication, bleeding, infection, perforation and the patient is agreeable to proceed. Written consent to be obtained.  Patient failed Amitiza, miralax. Linzess 1107mg daily is too strong. Continue Dulcolax prn for now. Can try Linzess, mix 1/2 of capsule contents in water and take but will have to waste the other half.

## 2015-03-10 NOTE — Progress Notes (Signed)
cc'd to pcp 

## 2015-03-11 ENCOUNTER — Telehealth: Payer: Self-pay | Admitting: General Practice

## 2015-03-11 NOTE — Telephone Encounter (Signed)
-----   Message from Mahala Menghini, PA-C sent at 03/10/2015  3:55 PM EDT ----- Needs NIC for ov with rmr only in six months for PBC.

## 2015-03-11 NOTE — Telephone Encounter (Signed)
RECALL MADE 

## 2015-03-24 ENCOUNTER — Ambulatory Visit (HOSPITAL_COMMUNITY)
Admission: RE | Admit: 2015-03-24 | Discharge: 2015-03-24 | Disposition: A | Discharge status: Home / Self Care | Payer: 59 | Source: Ambulatory Visit | Attending: Internal Medicine | Admitting: Internal Medicine

## 2015-03-24 ENCOUNTER — Encounter (HOSPITAL_COMMUNITY)
Admission: RE | Disposition: A | Discharge status: Home / Self Care | Payer: Self-pay | Source: Ambulatory Visit | Attending: Internal Medicine

## 2015-03-24 ENCOUNTER — Encounter (HOSPITAL_COMMUNITY): Payer: Self-pay | Admitting: *Deleted

## 2015-03-24 DIAGNOSIS — Z860100 Personal history of colon polyps, unspecified: Secondary | ICD-10-CM | POA: Insufficient documentation

## 2015-03-24 DIAGNOSIS — Z1211 Encounter for screening for malignant neoplasm of colon: Secondary | ICD-10-CM | POA: Diagnosis not present

## 2015-03-24 DIAGNOSIS — Z7982 Long term (current) use of aspirin: Secondary | ICD-10-CM | POA: Diagnosis not present

## 2015-03-24 DIAGNOSIS — D125 Benign neoplasm of sigmoid colon: Secondary | ICD-10-CM | POA: Diagnosis not present

## 2015-03-24 DIAGNOSIS — K5730 Diverticulosis of large intestine without perforation or abscess without bleeding: Secondary | ICD-10-CM | POA: Insufficient documentation

## 2015-03-24 DIAGNOSIS — Z9049 Acquired absence of other specified parts of digestive tract: Secondary | ICD-10-CM | POA: Diagnosis not present

## 2015-03-24 DIAGNOSIS — R194 Change in bowel habit: Secondary | ICD-10-CM | POA: Diagnosis not present

## 2015-03-24 DIAGNOSIS — Z79899 Other long term (current) drug therapy: Secondary | ICD-10-CM | POA: Insufficient documentation

## 2015-03-24 DIAGNOSIS — K743 Primary biliary cirrhosis: Secondary | ICD-10-CM | POA: Diagnosis not present

## 2015-03-24 DIAGNOSIS — F17210 Nicotine dependence, cigarettes, uncomplicated: Secondary | ICD-10-CM | POA: Diagnosis not present

## 2015-03-24 DIAGNOSIS — R198 Other specified symptoms and signs involving the digestive system and abdomen: Secondary | ICD-10-CM

## 2015-03-24 DIAGNOSIS — R933 Abnormal findings on diagnostic imaging of other parts of digestive tract: Secondary | ICD-10-CM | POA: Diagnosis present

## 2015-03-24 HISTORY — PX: COLONOSCOPY: SHX5424

## 2015-03-24 SURGERY — COLONOSCOPY
Anesthesia: Moderate Sedation

## 2015-03-24 MED ORDER — ONDANSETRON HCL 4 MG/2ML IJ SOLN
INTRAMUSCULAR | Status: AC
  Filled 2015-03-24: qty 2

## 2015-03-24 MED ORDER — SIMETHICONE 40 MG/0.6ML PO SUSP
ORAL | Status: DC | PRN
  Administered 2015-03-24: 13:00:00

## 2015-03-24 MED ORDER — MEPERIDINE HCL 100 MG/ML IJ SOLN
INTRAMUSCULAR | Status: DC | PRN
  Administered 2015-03-24: 50 mg via INTRAVENOUS
  Administered 2015-03-24: 25 mg via INTRAVENOUS

## 2015-03-24 MED ORDER — ONDANSETRON HCL 4 MG/2ML IJ SOLN
INTRAMUSCULAR | Status: DC | PRN
  Administered 2015-03-24: 4 mg via INTRAVENOUS

## 2015-03-24 MED ORDER — MIDAZOLAM HCL 5 MG/5ML IJ SOLN
INTRAMUSCULAR | Status: DC | PRN
  Administered 2015-03-24 (×2): 2 mg via INTRAVENOUS

## 2015-03-24 MED ORDER — MIDAZOLAM HCL 5 MG/5ML IJ SOLN
INTRAMUSCULAR | Status: AC
  Filled 2015-03-24: qty 10

## 2015-03-24 MED ORDER — MEPERIDINE HCL 100 MG/ML IJ SOLN
INTRAMUSCULAR | Status: AC
  Filled 2015-03-24: qty 2

## 2015-03-24 MED ORDER — SODIUM CHLORIDE 0.9 % IV SOLN
INTRAVENOUS | Status: DC
  Administered 2015-03-24: 12:00:00 via INTRAVENOUS

## 2015-03-24 NOTE — Discharge Instructions (Signed)
Colonoscopy Discharge Instructions  Read the instructions outlined below and refer to this sheet in the next few weeks. These discharge instructions provide you with general information on caring for yourself after you leave the hospital. Your doctor may also give you specific instructions. While your treatment has been planned according to the most current medical practices available, unavoidable complications occasionally occur. If you have any problems or questions after discharge, call Dr. Gala Romney at [phone]. ACTIVITY  You may resume your regular activity, but move at a slower pace for the next 24 hours.   Take frequent rest periods for the next 24 hours.   Walking will help get rid of the air and reduce the bloated feeling in your belly (abdomen).   No driving for 24 hours (because of the medicine (anesthesia) used during the test).    Do not sign any important legal documents or operate any machinery for 24 hours (because of the anesthesia used during the test).  NUTRITION  Drink plenty of fluids.   You may resume your normal diet as instructed by your doctor.   Begin with a light meal and progress to your normal diet. Heavy or fried foods are harder to digest and may make you feel sick to your stomach (nauseated).   Avoid alcoholic beverages for 24 hours or as instructed.  MEDICATIONS  You may resume your normal medications unless your doctor tells you otherwise.  WHAT YOU CAN EXPECT TODAY  Some feelings of bloating in the abdomen.   Passage of more gas than usual.   Spotting of blood in your stool or on the toilet paper.  IF YOU HAD POLYPS REMOVED DURING THE COLONOSCOPY:  No aspirin products for 7 days or as instructed.   No alcohol for 7 days or as instructed.   Eat a soft diet for the next 24 hours.  FINDING OUT THE RESULTS OF YOUR TEST Not all test results are available during your visit. If your test results are not back during the visit, make an appointment  with your caregiver to find out the results. Do not assume everything is normal if you have not heard from your caregiver or the medical facility. It is important for you to follow up on all of your test results.  SEEK IMMEDIATE MEDICAL ATTENTION IF:  You have more than a spotting of blood in your stool.   Your belly is swollen (abdominal distention).   You are nauseated or vomiting.   You have a temperature over 101.   You have abdominal pain or discomfort that is severe or gets worse throughout the day.    Diverticulosis, constipation and polyp information provided  Begin Benefiber 2 teaspoons twice daily  Take MiraLAX 1 capful once to twice daily as needed for constipation  Further recommendations to follow pending review of pathology report  Office visit with [place] in 6 months.  Hepatic profile just before office visit in 6 months   Constipation Constipation is when a person has fewer than three bowel movements a week, has difficulty having a bowel movement, or has stools that are dry, hard, or larger than normal. As people grow older, constipation is more common. If you try to fix constipation with medicines that make you have a bowel movement (laxatives), the problem may get worse. Long-term laxative use may cause the muscles of the colon to become weak. A low-fiber diet, not taking in enough fluids, and taking certain medicines may make constipation worse.  CAUSES   Certain  medicines, such as antidepressants, pain medicine, iron supplements, antacids, and water pills.   Certain diseases, such as diabetes, irritable bowel syndrome (IBS), thyroid disease, or depression.   Not drinking enough water.   Not eating enough fiber-rich foods.   Stress or travel.   Lack of physical activity or exercise.   Ignoring the urge to have a bowel movement.   Using laxatives too much.  SIGNS AND SYMPTOMS   Having fewer than three bowel movements a week.   Straining to have  a bowel movement.   Having stools that are hard, dry, or larger than normal.   Feeling full or bloated.   Pain in the lower abdomen.   Not feeling relief after having a bowel movement.  DIAGNOSIS  Your health care provider will take a medical history and perform a physical exam. Further testing may be done for severe constipation. Some tests may include:  A barium enema X-ray to examine your rectum, colon, and, sometimes, your small intestine.   A sigmoidoscopy to examine your lower colon.   A colonoscopy to examine your entire colon. TREATMENT  Treatment will depend on the severity of your constipation and what is causing it. Some dietary treatments include drinking more fluids and eating more fiber-rich foods. Lifestyle treatments may include regular exercise. If these diet and lifestyle recommendations do not help, your health care provider may recommend taking over-the-counter laxative medicines to help you have bowel movements. Prescription medicines may be prescribed if over-the-counter medicines do not work.  HOME CARE INSTRUCTIONS   Eat foods that have a lot of fiber, such as fruits, vegetables, whole grains, and beans.  Limit foods high in fat and processed sugars, such as french fries, hamburgers, cookies, candies, and soda.   A fiber supplement may be added to your diet if you cannot get enough fiber from foods.   Drink enough fluids to keep your urine clear or pale yellow.   Exercise regularly or as directed by your health care provider.   Go to the restroom when you have the urge to go. Do not hold it.   Only take over-the-counter or prescription medicines as directed by your health care provider. Do not take other medicines for constipation without talking to your health care provider first.  [place] IF:   You have bright red blood in your stool.   Your constipation lasts for more than 4 days or gets worse.   You have abdominal  or rectal pain.   You have thin, pencil-like stools.   You have unexplained weight loss. MAKE SURE YOU:   Understand these instructions.  Will watch your condition.  Will get help right away if you are not doing well or get worse. Document Released: 06/02/2004 Document Revised: 09/09/2013 Document Reviewed: 06/16/2013 [facility] Patient Information 2015 [place], [place]. This information is not intended to replace advice given to you by your health care provider. Make sure you discuss any questions you have with your health care provider.    Diverticulosis Diverticulosis is the condition that develops when small pouches (diverticula) form in the wall of your colon. Your colon, or large intestine, is where water is absorbed and stool is formed. The pouches form when the inside layer of your colon pushes through weak spots in the outer layers of your colon. CAUSES  No one knows exactly what causes diverticulosis. RISK FACTORS  Being older than 25. Your risk for this condition increases with age. Diverticulosis is rare in people younger  than 40 years. By age 64, almost everyone has it.  Eating a low-fiber diet.  Being frequently constipated.  Being overweight.  Not getting enough exercise.  Smoking.  Taking over-the-counter pain medicines, like aspirin and ibuprofen. SYMPTOMS  Most people with diverticulosis do not have symptoms. DIAGNOSIS  Because diverticulosis often has no symptoms, health care providers often discover the condition during an exam for other colon problems. In many cases, a health care provider will diagnose diverticulosis while using a flexible scope to examine the colon (colonoscopy). TREATMENT  If you have never developed an infection related to diverticulosis, you may not need treatment. If you have had an infection before, treatment may include:  Eating more fruits, vegetables, and grains.  Taking a fiber supplement.  Taking a live bacteria  supplement (probiotic).  Taking medicine to relax your colon. HOME CARE INSTRUCTIONS   Drink at least 6-8 glasses of water each day to prevent constipation.  Try not to strain when you have a bowel movement.  Keep all follow-up appointments. If you have had an infection before:  Increase the fiber in your diet as directed by your health care provider or dietitian.  Take a dietary fiber supplement if your health care provider approves.  Only take medicines as directed by your health care provider. SEEK MEDICAL CARE IF:   You have abdominal pain.  You have bloating.  You have cramps.  You have not gone to the bathroom in 3 days. SEEK IMMEDIATE MEDICAL CARE IF:   Your pain gets worse.  Yourbloating becomes very bad.  You have a fever or chills, and your symptoms suddenly get worse.  You begin vomiting.  You have bowel movements that are bloody or black. MAKE SURE YOU:  Understand these instructions.  Will watch your condition.  Will get help right away if you are not doing well or get worse. Document Released: 06/01/2004 Document Revised: 09/09/2013 Document Reviewed: 07/30/2013 [facility] Patient Information 2015 [place], [place]. This information is not intended to replace advice given to you by your health care provider. Make sure you discuss any questions you have with your health care provider.  Colon Polyps Polyps are lumps of extra tissue growing inside the body. Polyps can grow in the large intestine (colon). Most colon polyps are noncancerous (benign). However, some colon polyps can become cancerous over time. Polyps that are larger than a pea may be harmful. To be safe, caregivers remove and test all polyps. CAUSES  Polyps form when mutations in the genes cause your cells to grow and divide even though no more tissue is needed. RISK FACTORS There are a number of risk factors that can increase your chances of getting colon polyps. They include:  Being older  than 50 years.  Family history of colon polyps or colon cancer.  Long-term colon diseases, such as colitis or Crohn disease.  Being overweight.  Smoking.  Being inactive.  Drinking too much alcohol. SYMPTOMS  Most small polyps do not cause symptoms. If symptoms are present, they may include:  Blood in the stool. The stool may look dark red or black.  Constipation or diarrhea that lasts longer than 1 week. DIAGNOSIS People often do not know they have polyps until their caregiver finds them during a regular checkup. Your caregiver can use 4 tests to check for polyps:  Digital rectal exam. The caregiver wears gloves and feels inside the rectum. This test would find polyps only in the rectum.  Barium enema. The caregiver puts a liquid  called barium into your rectum before taking X-rays of your colon. Barium makes your colon look white. Polyps are dark, so they are easy to see in the X-ray pictures.  Sigmoidoscopy. A thin, flexible tube (sigmoidoscope) is placed into your rectum. The sigmoidoscope has a light and tiny camera in it. The caregiver uses the sigmoidoscope to look at the last third of your colon.  Colonoscopy. This test is like sigmoidoscopy, but the caregiver looks at the entire colon. This is the most common method for finding and removing polyps. TREATMENT  Any polyps will be removed during a sigmoidoscopy or colonoscopy. The polyps are then tested for cancer. PREVENTION  To help lower your risk of getting more colon polyps:  Eat plenty of fruits and vegetables. Avoid eating fatty foods.  Do not smoke.  Avoid drinking alcohol.  Exercise every day.  Lose weight if recommended by your caregiver.  Eat plenty of calcium and folate. Foods that are rich in calcium include milk, cheese, and broccoli. Foods that are rich in folate include chickpeas, kidney beans, and spinach. HOME CARE INSTRUCTIONS Keep all follow-up appointments as directed by your caregiver. You may  need periodic exams to check for polyps. SEEK MEDICAL CARE IF: You notice bleeding during a bowel movement. Document Released: 05/31/2004 Document Revised: 11/27/2011 Document Reviewed: 11/14/2011 [facility] Patient Information 2015 [place], [place]. This information is not intended to replace advice given to you by your health care provider. Make sure you discuss any questions you have with your health care provider.

## 2015-03-24 NOTE — Interval H&P Note (Signed)
History and Physical Interval Note:  03/24/2015 12:49 PM  Jeffrey Gutierrez  has presented today for surgery, with the diagnosis of CHANGE IN BOWELS  The various methods of treatment have been discussed with the patient and family. After consideration of risks, benefits and other options for treatment, the patient has consented to  Procedure(s) with comments: COLONOSCOPY ([name]) - 1230-moved to 1245 Office to notify pt as a surgical intervention .  The patient's history has been reviewed, patient examined, no change in status, stable for surgery.  I have reviewed the patient's chart and labs.  Questions were answered to the patient's satisfaction.     Jeffrey Gutierrez  No change. Diagnostic colonoscopy per plan.  The risks, benefits, limitations, alternatives and imponderables have been reviewed with the patient. Questions have been answered. All parties are agreeable.

## 2015-03-24 NOTE — H&P (View-Only) (Signed)
Primary Care Physician: Purvis Kilts, MD  Primary Gastroenterologist:  Garfield Cornea, MD   Chief Complaint  Patient presents with  . Follow-up    HPI: Jeffrey Gutierrez is a 59 y.o. male here follow up. Change in bowel habits over the last 2-3 months. Used to have a bowel movement twice daily. Initially evaluated by her practice last month. Patient had already tried and failed Amitiza 24 g twice a day. Took for about 10 days. Tried Linzess 167m daily on empty stomach but had watery diarrhea multiple times daily. He took for 8 days straight this never improved. He finally had to stop taking it. Reports MiraLAX didn't work. Currently taking Dulcolax 1 tablet 1-2 times daily. Denies any abdominal cramping. Bowel movements several times a week on this regimen. Denies blood in the stool or melena. Complains of pressure in the right abdomen, worse with constipation, better after bowel movement. Feels like a balloon blowing up in his abdomen. No heartburn. Denies dysphagia, vomiting, unintentional weight loss.  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 Did not tolerate Urso, developed nausea even when he took at bedtime. Was on lowest dose but still had nausea so he stopped medication is not interested in pursuing further.  Abdominal ultrasound last month showed normal common bile duct diameter. Questionable mild fatty liver. Patient had a CT back in December 2015 that showed uncomplicated diverticulitis. He believes he had a CT within a couple months ago [facility] We will request records soon as patient [place].  Current Outpatient Prescriptions  Medication Sig Dispense Refill  . aspirin 81 MG tablet Take 81 mg by mouth every evening.     . fexofenadine (ALLEGRA) 180 MG tablet Take 180 mg by mouth daily as needed for allergies.     . fish oil-omega-3 fatty acids 1000 MG capsule Take 2 g by mouth every  evening.     . Multiple Vitamin (MULTIVITAMIN) capsule Take 1 capsule by mouth every evening.     .Marland KitchenPROAIR HFA 108 (90 BASE) MCG/ACT inhaler   10   No current facility-administered medications for this visit.    Allergies as of 03/10/2015 - Review Complete 03/10/2015  Allergen Reaction Noted  . Hydrocodone Anaphylaxis and Other (See Comments)    Past Medical History  Diagnosis Date  . PBC (primary biliary cirrhosis)     diagnosed 2006  . S/P colonoscopy 2009    pancolonic diverticula, hyperplastic rectal polyps  . Hyperplastic rectal polyp    Past Surgical History  Procedure Laterality Date  . Cholecystectomy    . Liver biopsy    . Nasal septum surgery    . Arm surgery    . Inguinal hernia repair  Feb 2012    Dr. JArnoldo Morale  . Colonoscopy  2009    pancolonic diverticula, hyperplastic rectal polyps   Family History  Problem Relation Age of Onset  . Pneumonia Father   . Cancer Mother    History   Social History  . Marital Status: Married    Spouse Name: [name]  . Number of Children: [name]  . Years of Education: [name]   Social History Main Topics  . Smoking status: Current Every Day Smoker -- 1.50 packs/day    Types: Cigarettes  . Smokeless tobacco: Not on file  . Alcohol Use: Yes     Comment: maybe once a week  . Drug  Use: No  . Sexual Activity: Not on file   Other Topics Concern  . None   Social History Narrative     ROS:  General: Negative for anorexia, weight loss, fever, chills, fatigue, weakness. Eyes: Negative for vision changes.  ENT: Negative for hoarseness, difficulty swallowing , nasal congestion. CV: Negative for chest pain, angina, palpitations, dyspnea on exertion, peripheral edema.  Respiratory: Negative for dyspnea at rest, dyspnea on exertion, cough, sputum, wheezing.  GI: See history of present illness. GU:  Negative for dysuria, hematuria, urinary incontinence, urinary frequency, nocturnal urination.  MS: Negative for joint pain, low back  pain.  Derm: Negative for rash or itching.  Neuro: Negative for weakness, abnormal sensation, seizure, frequent headaches, memory loss, confusion.  Psych: Negative for anxiety, depression, suicidal ideation, hallucinations.  Endo: Negative for unusual weight change.  Heme: Negative for bruising or bleeding. Allergy: Negative for rash or hives. Physical Examination:   BP 140/90 mmHg  Pulse 82  Temp(Src) 97.1 F (36.2 C)  Ht '6\' 1"'$  (6.237 m)  Wt 196 lb 9.6 oz (89.177 kg)  BMI 25.94 kg/m2  Physical Examination:   General: Well-nourished, well-developed in no acute distress.  Head: Normocephalic, atraumatic.   Eyes: Conjunctiva pink, no icterus. Mouth: Oropharyngeal mucosa moist and pink , no lesions erythema or exudate. Neck: Supple without thyromegaly, masses, or lymphadenopathy.  Lungs: Clear to auscultation bilaterally.  Heart: Regular rate and rhythm, no murmurs rubs or gallops.  Abdomen: Bowel sounds are normal, nontender, nondistended, no hepatosplenomegaly or masses, no abdominal bruits or    hernia , no rebound or guarding.   Extremities: No lower extremity edema.  Neuro: Alert and oriented x 4 , grossly normal neurologically.  Skin: Warm and dry, no rash or jaundice.   Psych: Alert and cooperative, normal mood and affect.   Labs:  Lab Results  Component Value Date   CREATININE 0.86 02/04/2015   BUN 9 02/04/2015   NA 134* 02/04/2015   K 4.2 02/04/2015   CL 95* 02/04/2015   CO2 32 02/04/2015   Lab Results  Component Value Date   ALT 23 02/04/2015   AST 30 02/04/2015   ALKPHOS 75 02/04/2015   BILITOT 0.5 02/04/2015   Lab Results  Component Value Date   WBC 4.1 02/04/2015   HGB 15.3 02/04/2015   HCT 45.5 02/04/2015   MCV 86.3 02/04/2015   PLT 149* 02/04/2015      Imaging Studies: US Abdomen Limited Ruq  Feb 26, 2015   CLINICAL DATA:  Primary biliary cirrhosis  EXAM: US ABDOMEN LIMITED - RIGHT UPPER QUADRANT  COMPARISON:  CT scan 09/02/2014  FINDINGS:  Gallbladder:  Surgically absent  Common bile duct:  Diameter: 5 mm in diameter within normal limits.  Liver:  No focal hepatic mass. Subtle mild increased echogenicity of the liver. Mild hepatic fatty infiltration cannot be excluded. No intrahepatic biliary ductal dilatation.  IMPRESSION: 1. Surgically absent gallbladder. Normal CBD. Question mild fatty infiltration of the liver. No focal hepatic mass.   Electronically Signed   By: Lahoma Crocker M.D.   On: February 26, 2015 09:10

## 2015-03-24 NOTE — Op Note (Signed)
[facility] [address] [place], [address]   COLONOSCOPY PROCEDURE REPORT  PATIENT: Jeffrey, Gutierrez  MR#: [id number] BIRTHDATE: May 06, 1956 , 89  yrs. old GENDER: male ENDOSCOPIST: R.  Garfield Cornea, MD FACP [facility] REFERRED DU:KGUR Hilma Favors, M.D. PROCEDURE DATE:  Apr 05, 2015 PROCEDURE:   Colonoscopy with snare polypectomy INDICATIONS:  abnormal sigmoid colon on CT, clinically, bout of diverticulitis about 6 months ago.Marland Kitchen MEDICATIONS: Versed 4 mg IV and Demerol 75 mg IV in divided doses. Zofran 4 mg IV. ASA CLASS:       Class II  CONSENT: The risks, benefits, alternatives and imponderables including but not limited to bleeding, perforation as well as the possibility of a missed lesion have been reviewed.  The potential for biopsy, lesion removal, etc. have also been discussed. Questions have been answered.  All parties agreeable.  Please see the history and physical in the medical record for more information.  DESCRIPTION OF PROCEDURE:   After the risks benefits and alternatives of the procedure were thoroughly explained, informed consent was obtained.  The digital rectal exam revealed no abnormalities of the rectum.   The EC-[id number] ([id number])  endoscope was introduced through the anus and advanced to the cecum, which was identified by both the appendix and ileocecal valve. No adverse events experienced.   The quality of the prep was adequate  The instrument was then slowly withdrawn as the colon was fully examined. Estimated blood loss is zero unless otherwise noted in this procedure report.      COLON FINDINGS: Normal-appearing rectal mucosa.  Small rectal vault. Attempted to retroflex but could not do so.  Rectal mucosa seen well on?"face.  Patient was noted have scattered pancolonic diverticula.  In the sigmoid segment of mucosa appears somewhat thickened with scattered submucosal hemorrhage.  There was (1) 5 mm polyp in this segment as well; otherwise,  remainder of the colonic mucosa appeared normal.  The above mentioned polyp was hot snare removed.  Retroflexion was not performed. .  Withdrawal time=12 minutes 0 seconds.  The scope was withdrawn and the procedure completed. COMPLICATIONS: There were no immediate complications.  ENDOSCOPIC IMPRESSION: Single colonic polyp?"removed as described above. Pancolonic diverticulosis. Abnormal sigmoid colon consistent with  stigmata of recent diverticulitis  RECOMMENDATIONS: Follow up on pathology. Begin Benefiber 2 teaspoons twice daily. Use MiraLAX 1 capful 1-2 times daily as needed for constipation. Office follow up with [place] in 6 months with LFTs.  eSigned:  R. Garfield Cornea, MD [facility] April 05, 2015 1:34 PM   cc:  CPT CODES: ICD CODES:  The ICD and CPT codes recommended by this software are interpretations from the data that the clinical staff has captured with the software.  The verification of the translation of this report to the ICD and CPT codes and modifiers is the sole responsibility of the health care institution and practicing physician where this report was generated.  [place]. will not be held responsible for the validity of the ICD and CPT codes included on this report.  AMA assumes no liability for data contained or not contained herein. CPT is a Designer, television/film set of the [employer].  PATIENT NAME:  Jeffrey, Gutierrez MR#: [id number]

## 2015-03-25 ENCOUNTER — Encounter (HOSPITAL_COMMUNITY): Payer: Self-pay | Admitting: Internal Medicine

## 2015-03-26 ENCOUNTER — Telehealth: Payer: Self-pay

## 2015-03-26 NOTE — Telephone Encounter (Signed)
Noted. Called pt and LMOM

## 2015-03-26 NOTE — Telephone Encounter (Signed)
1 small polyp hot snared. I recommend continue to monitor. Would avoid aspirin and non-steroidal agents times the next 7 days. Call if persistent.Marland Kitchen

## 2015-03-26 NOTE — Telephone Encounter (Signed)
Pt called and states that he had a colonoscopy with [facility] on Wednesday. States that now when he goes to the restroom after a BM he sees some blood. States it is a small amount.  States no pain .   States that the amount of blood is minimal.  Advised if he has pain that begins or if the blood loss is greater than a Teaspoon to go to the ER.  Please advise

## 2015-03-27 ENCOUNTER — Encounter: Payer: Self-pay | Admitting: Internal Medicine

## 2015-03-30 NOTE — Telephone Encounter (Signed)
He has tried other agents. I would recommend just taking a dose of MiraLax about every hour x4-5 hours. This is the same stuff he used for his colonoscopy prep basically. What he has had some bowel movements, he can drop back to taking MiraLax on the order of twice daily. Right-sided pain should get better, if not, let us know.

## 2015-03-30 NOTE — Telephone Encounter (Signed)
Pt is aware.  

## 2015-03-30 NOTE — Telephone Encounter (Signed)
Patient called this morning. He has not seen any more blood since Friday, but now he is c/o R side abd pain. He said he has not had a bm since Saturday, and that bm was very small. He has been taking fiber and miralax and it is not working. No N/V, no fever. Is there anything else he can try? Or do you want him to be seen?

## 2015-03-30 NOTE — Telephone Encounter (Signed)
Would not do anything else at this time. If severe abdominal pain or vomiting, would go to the emergency department. MiraLax sometimes has a little bit of a delayed effect. Hopefully, he will achieve a bowel movement by tomorrow morning.

## 2015-03-30 NOTE — Telephone Encounter (Signed)
Pt has taken 5 doses of miralax today. His last dose was at 1pm. He is still not having a bm. He wants to know if there is anything else he can do.

## 2015-04-05 ENCOUNTER — Emergency Department (HOSPITAL_COMMUNITY): Payer: 59

## 2015-04-05 ENCOUNTER — Emergency Department (HOSPITAL_COMMUNITY)
Admission: EM | Admit: 2015-04-05 | Discharge: 2015-04-05 | Disposition: A | Discharge status: Home / Self Care | Payer: 59 | Attending: Emergency Medicine | Admitting: Emergency Medicine

## 2015-04-05 ENCOUNTER — Encounter (HOSPITAL_COMMUNITY): Payer: Self-pay | Admitting: *Deleted

## 2015-04-05 DIAGNOSIS — Z720 Tobacco use: Secondary | ICD-10-CM | POA: Diagnosis not present

## 2015-04-05 DIAGNOSIS — K4020 Bilateral inguinal hernia, without obstruction or gangrene, not specified as recurrent: Secondary | ICD-10-CM | POA: Diagnosis not present

## 2015-04-05 DIAGNOSIS — K5900 Constipation, unspecified: Secondary | ICD-10-CM | POA: Insufficient documentation

## 2015-04-05 DIAGNOSIS — Z79899 Other long term (current) drug therapy: Secondary | ICD-10-CM | POA: Diagnosis not present

## 2015-04-05 DIAGNOSIS — K5730 Diverticulosis of large intestine without perforation or abscess without bleeding: Secondary | ICD-10-CM | POA: Diagnosis not present

## 2015-04-05 DIAGNOSIS — R109 Unspecified abdominal pain: Secondary | ICD-10-CM | POA: Diagnosis present

## 2015-04-05 HISTORY — DX: Diverticulitis of intestine, part unspecified, without perforation or abscess without bleeding: K57.92

## 2015-04-05 LAB — COMPREHENSIVE METABOLIC PANEL
ALT: 21 U/L (ref 17–63)
ANION GAP: 8 (ref 5–15)
AST: 29 U/L (ref 15–41)
Albumin: 3.7 g/dL (ref 3.5–5.0)
Alkaline Phosphatase: 82 U/L (ref 38–126)
BILIRUBIN TOTAL: 0.5 mg/dL (ref 0.3–1.2)
BUN: 8 mg/dL (ref 6–20)
CALCIUM: 8.8 mg/dL — AB (ref 8.9–10.3)
CO2: 30 mmol/L (ref 22–32)
CREATININE: 0.86 mg/dL (ref 0.61–1.24)
Chloride: 95 mmol/L — ABNORMAL LOW (ref 101–111)
GFR calc Af Amer: >60 mL/min (ref >60)
GFR calc non Af Amer: >60 mL/min (ref >60)
Glucose, Bld: 130 mg/dL — ABNORMAL HIGH (ref 65–99)
Potassium: 4.3 mmol/L (ref 3.5–5.1)
Sodium: 133 mmol/L — ABNORMAL LOW (ref 135–145)
Total Protein: 8.4 g/dL — ABNORMAL HIGH (ref 6.5–8.1)

## 2015-04-05 LAB — CBC WITH DIFFERENTIAL/PLATELET
BASOS ABS: 0 10*3/uL (ref 0.0–0.1)
Basophils Relative: 1 % (ref 0–1)
Eosinophils Absolute: 0.1 10*3/uL (ref 0.0–0.7)
Eosinophils Relative: 2 % (ref 0–5)
HCT: 45.7 % (ref 39.0–52.0)
Hemoglobin: 15.6 g/dL (ref 13.0–17.0)
LYMPHS PCT: 47 % — AB (ref 12–46)
Lymphs Abs: 1.9 10*3/uL (ref 0.7–4.0)
MCH: 29.7 pg (ref 26.0–34.0)
MCHC: 34.1 g/dL (ref 30.0–36.0)
MCV: 86.9 fL (ref 78.0–100.0)
Monocytes Absolute: 0.4 10*3/uL (ref 0.1–1.0)
Monocytes Relative: 11 % (ref 3–12)
NEUTROS ABS: 1.6 10*3/uL — AB (ref 1.7–7.7)
NEUTROS PCT: 39 % — AB (ref 43–77)
PLATELETS: 143 10*3/uL — AB (ref 150–400)
RBC: 5.26 MIL/uL (ref 4.22–5.81)
RDW: 13.6 % (ref 11.5–15.5)
WBC: 4 10*3/uL (ref 4.0–10.5)

## 2015-04-05 LAB — LIPASE, BLOOD: LIPASE: 35 U/L (ref 22–51)

## 2015-04-05 MED ORDER — SODIUM CHLORIDE 0.9 % IV BOLUS (SEPSIS)
500.0000 mL | Freq: Once | INTRAVENOUS | Status: AC
  Administered 2015-04-05: 500 mL via INTRAVENOUS

## 2015-04-05 MED ORDER — IOHEXOL 300 MG/ML  SOLN
100.0000 mL | Freq: Once | INTRAMUSCULAR | Status: AC | PRN
  Administered 2015-04-05: 100 mL via INTRAVENOUS

## 2015-04-05 MED ORDER — SODIUM CHLORIDE 0.9 % IV SOLN
INTRAVENOUS | Status: DC
  Administered 2015-04-05: 08:00:00 via INTRAVENOUS

## 2015-04-05 MED ORDER — FENTANYL CITRATE (PF) 100 MCG/2ML IJ SOLN
25.0000 ug | Freq: Once | INTRAMUSCULAR | Status: AC
  Administered 2015-04-05: 25 ug via INTRAVENOUS
  Filled 2015-04-05: qty 2

## 2015-04-05 MED ORDER — HYDROMORPHONE HCL 4 MG PO TABS: 4.0000 mg | ORAL_TABLET | Freq: Four times a day (QID) | ORAL | Status: DC | PRN

## 2015-04-05 MED ORDER — IOHEXOL 300 MG/ML  SOLN
50.0000 mL | Freq: Once | INTRAMUSCULAR | Status: AC | PRN
  Administered 2015-04-05: 50 mL via ORAL

## 2015-04-05 MED ORDER — ONDANSETRON HCL 4 MG/2ML IJ SOLN
4.0000 mg | Freq: Once | INTRAMUSCULAR | Status: AC
  Administered 2015-04-05: 4 mg via INTRAVENOUS
  Filled 2015-04-05: qty 2

## 2015-04-05 NOTE — ED Notes (Signed)
Patient reports hx of diverticulitis, followed by Dr. Gala Romney, was told to drink 5 glasses of miralax Wednesday with some relief. Reports pain in abdomen this am, LBM Saturday.

## 2015-04-05 NOTE — Discharge Instructions (Signed)
Follow-up with your GI Dr. Joellyn Rued pain medicine as needed. Return for any new or worse symptoms. Continue constipation regimen.

## 2015-04-05 NOTE — ED Provider Notes (Signed)
CSN: [id number]     Arrival date & time 04/05/15  7253 History   First MD Initiated Contact with Patient 04/05/15 [phone]     Chief Complaint  Patient presents with  . Abdominal Pain     (Consider location/radiation/quality/duration/timing/severity/associated sxs/prior Treatment) Patient is a 59 y.o. male presenting with abdominal pain. The history is provided by the patient.  Abdominal Pain Associated symptoms: constipation and nausea   Associated symptoms: no chest pain, no diarrhea, no dysuria, no fever, no shortness of breath and no vomiting    Patient with a complaint of abdominal pain since Wednesday has felt constipated since Tuesday been taking Merrill asked. Had colonoscopy 3 weeks ago. No nausea no vomiting no fevers states that the pain is 8 out of 10 ache in nature throughout the whole abdomen. Patient thinks his symptoms are either due to diverticulitis or due to constipation.  Past Medical History  Diagnosis Date  . PBC (primary biliary cirrhosis)     diagnosed 2006  . S/P colonoscopy 2009    pancolonic diverticula, hyperplastic rectal polyps  . Hyperplastic rectal polyp   . Diverticulitis    Past Surgical History  Procedure Laterality Date  . Cholecystectomy    . Liver biopsy    . Nasal septum surgery    . Arm surgery    . Inguinal hernia repair  Feb 2012    Dr. Arnoldo Morale   . Colonoscopy  2009    pancolonic diverticula, hyperplastic rectal polyps  . Colonoscopy [name] 03/24/2015    Procedure: COLONOSCOPY;  Surgeon: Daneil Dolin, MD;  Location: [facility];  Service: Endoscopy;  Laterality: [name];  1230-moved to 1245 Office to notify pt   Family History  Problem Relation Age of Onset  . Pneumonia Father   . Cancer Mother    History  Substance Use Topics  . Smoking status: Current Every Day Smoker -- 1.50 packs/day for 43 years    Types: Cigarettes  . Smokeless tobacco: Not on file  . Alcohol Use: Yes     Comment: maybe once a week    Review of Systems   Constitutional: Negative for fever.  HENT: Negative for congestion.   Eyes: Negative for redness.  Respiratory: Negative for shortness of breath.   Cardiovascular: Negative for chest pain.  Gastrointestinal: Positive for nausea, abdominal pain and constipation. Negative for vomiting and diarrhea.  Genitourinary: Negative for dysuria.  Musculoskeletal: Negative for back pain.  Skin: Negative for rash.  Neurological: Negative for headaches.  Hematological: Does not bruise/bleed easily.  Psychiatric/Behavioral: Negative for confusion.      Allergies  Hydrocodone  Home Medications   Prior to Admission medications   Medication Sig Start Date End Date Taking? Authorizing Provider  acetaminophen (TYLENOL) 500 MG tablet Take 500 mg by mouth every 6 (six) hours as needed for moderate pain or headache.   Yes Historical Provider, MD  Fiber, Guar Gum, CHEW Chew 2 tablets by mouth 2 (two) times daily.   Yes Historical Provider, MD  Multiple Vitamin (MULTIVITAMIN WITH MINERALS) TABS tablet Take 1 tablet by mouth daily.   Yes Historical Provider, MD  polyethylene glycol (MIRALAX / GLYCOLAX) packet Take 17 g by mouth daily.   Yes Historical Provider, MD  PROAIR HFA 108 (90 BASE) MCG/ACT inhaler Inhale 2 puffs into the lungs every 6 (six) hours as needed for wheezing or shortness of breath.  01/06/15  Yes Historical Provider, MD  HYDROmorphone (DILAUDID) 4 MG tablet Take 1 tablet (4 mg total) by  mouth every 6 (six) hours as needed for severe pain. 04/05/15   Fredia Sorrow, MD   BP 140/93 mmHg  Pulse 65  Temp(Src) 98.2 F (36.8 C) (Oral)  Resp 16  Ht 6' (1.829 m)  Wt 200 lb (90.719 kg)  BMI 27.12 kg/m2  SpO2 92% Physical Exam  Constitutional: He is oriented to person, place, and time. He appears well-developed and well-nourished. No distress.  HENT:  Head: Normocephalic and atraumatic.  Mouth/Throat: Oropharynx is clear and moist.  Eyes: Conjunctivae and EOM are normal. Pupils are equal,  round, and reactive to light.  Neck: Normal range of motion. Neck supple.  Cardiovascular: Normal rate and regular rhythm.   No murmur heard. Pulmonary/Chest: Effort normal and breath sounds normal. No respiratory distress.  Abdominal: Soft. Bowel sounds are normal. There is no tenderness.  Musculoskeletal: Normal range of motion.  Neurological: He is alert and oriented to person, place, and time. No cranial nerve deficit. He exhibits normal muscle tone. Coordination normal.  Nursing note and vitals reviewed.   ED Course  Procedures (including critical care time) Labs Review Labs Reviewed  CBC WITH DIFFERENTIAL/PLATELET - Abnormal; Notable for the following:    Platelets 143 (*)    Neutrophils Relative % 39 (*)    Neutro Abs 1.6 (*)    Lymphocytes Relative 47 (*)    All other components within normal limits  COMPREHENSIVE METABOLIC PANEL - Abnormal; Notable for the following:    Sodium 133 (*)    Chloride 95 (*)    Glucose, Bld 130 (*)    Calcium 8.8 (*)    Total Protein 8.4 (*)    All other components within normal limits  LIPASE, BLOOD   Results for orders placed or performed during the hospital encounter of 04/05/15  CBC with Differential  Result Value Ref Range   WBC 4.0 4.0 - 10.5 K/uL   RBC 5.26 4.22 - 5.81 MIL/uL   Hemoglobin 15.6 13.0 - 17.0 g/dL   HCT 45.7 39.0 - 52.0 %   MCV 86.9 78.0 - 100.0 fL   MCH 29.7 26.0 - 34.0 pg   MCHC 34.1 30.0 - 36.0 g/dL   RDW 13.6 11.5 - 15.5 %   Platelets 143 (L) 150 - 400 K/uL   Neutrophils Relative % 39 (L) 43 - 77 %   Neutro Abs 1.6 (L) 1.7 - 7.7 K/uL   Lymphocytes Relative 47 (H) 12 - 46 %   Lymphs Abs 1.9 0.7 - 4.0 K/uL   Monocytes Relative 11 3 - 12 %   Monocytes Absolute 0.4 0.1 - 1.0 K/uL   Eosinophils Relative 2 0 - 5 %   Eosinophils Absolute 0.1 0.0 - 0.7 K/uL   Basophils Relative 1 0 - 1 %   Basophils Absolute 0.0 0.0 - 0.1 K/uL  Comprehensive metabolic panel  Result Value Ref Range   Sodium 133 (L) 135 - 145  mmol/L   Potassium 4.3 3.5 - 5.1 mmol/L   Chloride 95 (L) 101 - 111 mmol/L   CO2 30 22 - 32 mmol/L   Glucose, Bld 130 (H) 65 - 99 mg/dL   BUN 8 6 - 20 mg/dL   Creatinine, Ser 0.86 0.61 - 1.24 mg/dL   Calcium 8.8 (L) 8.9 - 10.3 mg/dL   Total Protein 8.4 (H) 6.5 - 8.1 g/dL   Albumin 3.7 3.5 - 5.0 g/dL   AST 29 15 - 41 U/L   ALT 21 17 - 63 U/L   Alkaline Phosphatase  82 38 - 126 U/L   Total Bilirubin 0.5 0.3 - 1.2 mg/dL   GFR calc non Af Amer >60 >60 mL/min   GFR calc Af Amer >60 >60 mL/min   Anion gap 8 5 - 15  Lipase, blood  Result Value Ref Range   Lipase 35 22 - 51 U/L     Imaging Review Ct Abdomen Pelvis W Contrast  04/05/2015   CLINICAL DATA:  Abdominal pain this morning. History of diverticulitis.  EXAM: CT ABDOMEN AND PELVIS WITH CONTRAST  TECHNIQUE: Multidetector CT imaging of the abdomen and pelvis was performed using the standard protocol following bolus administration of intravenous contrast.  CONTRAST:  50 mL OMNIPAQUE IOHEXOL 300 MG/ML SOLN, 100 mL OMNIPAQUE IOHEXOL 300 MG/ML SOLN  COMPARISON:  CT abdomen and pelvis 09/02/2014.  FINDINGS: Mild dependent atelectasis is seen in the lung bases. There is no pleural or pericardial effusion. Heart size is normal.  The patient is status post cholecystectomy. The liver, spleen, adrenal glands, pancreas and kidneys are unremarkable. Scattered aortoiliac atherosclerosis without aneurysm is identified.  The patient has extensive diverticular disease which is worst in the sigmoid. The walls of the sigmoid colon are thickened but no pericolonic inflammatory change is identified. A knuckle of sigmoid colon extends into the left inguinal hernia without obstruction or other complicating feature. Stool burden in the ascending and transverse colon is moderate. There is no fluid collection or free intraperitoneal air. No lymphadenopathy is identified. Fat containing right inguinal hernia is noted.  No focal bony abnormality is identified.   IMPRESSION: Extensive diverticulosis with associated wall thickening of the sigmoid colon. No evidence of active inflammatory change is identified.  Status post cholecystectomy.  Aortoiliac atherosclerosis without aneurysm.  Bilateral inguinal hernias. The left inguinal hernia contains a knuckle of colon without obstruction or other complicating feature.   Electronically Signed   By: Inge Rise M.D.   On: 04/05/2015 08:53     EKG Interpretation None      MDM   Final diagnoses:  Abdominal pain  Bilateral inguinal hernia without obstruction or gangrene, recurrence not specified  Constipation, unspecified constipation type  Diverticulosis of large intestine without hemorrhage    Patient with complaint of abdominal pain. Had a history of diverticulitis in the past followed by Dr. Sydell Axon from GI. Patient felt that he's been constipated of late has been on a Merrill asked the regimen for GI medicine. No nausea no vomiting no fevers.  CT scan shows extensive diverticulosis but no evidence of diverticulitis. However does show thickening of the large intestine. No evidence of true inflammation. Also there are bilateral hernias. But no evidence of any incarceration. Moderate stool burden but no extensive constipation.  Patient does have a bottle Merrill asked at home to take that for the constipation. Otherwise recommend follow-up with his GI doctor if symptoms get worse always could develop in the diverticulitis but no evidence of that today. Patient nontoxic no acute distress. Labs without any significant abnormalities.    Fredia Sorrow, MD 04/05/15 [phone]

## 2015-04-05 NOTE — ED Notes (Signed)
Pt oxygen sat drops to 87% on RA. Pt is a smoker. When oxygen drops pt is in no distress. MD Zackowski made aware.

## 2015-04-08 ENCOUNTER — Telehealth: Payer: Self-pay

## 2015-04-08 NOTE — Telephone Encounter (Signed)
Pt called- he went to the ED on Monday with severe pain under his ribs that went all the way across his stomach. ED did CT and labs and told him everything looked good. Gave him pain meds, he said he cant take them d/t he is allergic to it. Pt is not in severe pain now but is still sore. No N/V, no fever, no blood in his stool. He said he is still taking the miralax and fiber. He is not constipated anymore. Having pretty good bms.   Pt wants to know what RMR thinks might be wrong.

## 2015-04-09 NOTE — Telephone Encounter (Signed)
I looked at his ER evaluation. CT looks good except for bilateral inguinal hernias. Blood work looked good as well. Nothing to explain the pain he was describing. May have been a muscle strain of some sort. Am glad his constipation is doing better.  Would offer a followup appointment with [place] in several weeks from now (if not already scheduled) just to review GI issues. It doesn't sound like his inguinal hernias had anything to do with his recent acute episode.

## 2015-04-13 ENCOUNTER — Telehealth: Payer: Self-pay | Admitting: Internal Medicine

## 2015-04-13 NOTE — Telephone Encounter (Signed)
I called pt. He said that he is still having problems with food not digesting good when he eats. Michela Pitcher it feels like it goes down OK but it just doesn't digest.  He has abdominal pain ( mid stomach) after eating most of the time and sometimes it radiates to his right side. He still does not have a BM every day. He is taking the Miralax daily and fiber bid. His appt to see Walden Field, NP is on 05/10/2015. Please advise!

## 2015-04-13 NOTE — Telephone Encounter (Signed)
PLEASE CALL PATIENT REGARDING PAIN, JULIE WAS SUPPOSED TO TALK TO RMR AND GIVE HIM A CALL BACK   [phone]

## 2015-04-13 NOTE — Telephone Encounter (Signed)
Please make sure patient is aware of RMR's response from 04/09/15. No documentation that patient was called.  Per RMR, patient needs to increase Miralax to 17g BID and continue fiber BID.   Call if fever, worsening abdominal pain, if increasing miralax doesn't help. Otherwise, OV as scheduled.

## 2015-04-13 NOTE — Telephone Encounter (Signed)
Pt has appointment on 05/10/15 with [facility]

## 2015-04-14 NOTE — Telephone Encounter (Signed)
Pt is aware and also aware of Dr. Roseanne Kaufman report on the CT. He will try the Miralax bid as recommended, but he said that it did not work before.  He will call if problems.

## 2015-04-21 MED ORDER — PANTOPRAZOLE SODIUM 40 MG PO TBEC: 40.0000 mg | DELAYED_RELEASE_TABLET | Freq: Every day | ORAL | Status: DC

## 2015-04-21 NOTE — Telephone Encounter (Signed)
Start taking a PPI (Protonix) once daily. I have sent this to the pharmacy. We can decide on an EGD if needed when he comes on the 22nd. Contact us if worsening of symptoms despite PPI.

## 2015-04-21 NOTE — Addendum Note (Signed)
Addended by: Orvil Feil on: 04/21/2015 04:41 PM   Modules accepted: Orders

## 2015-04-21 NOTE — Telephone Encounter (Signed)
I spoke with the pt. He said he is already taking zantac prn and omrprazole prn. He has been taking both of these daily for the last several days. I informed him that we did not have these medications on his drug list. He said that was because he never told us because he only took them prn. He said the miralax wasn't working either. He took a bottle of mag citrate yesterday and feels better today after having several bm's. He is not having any N/V. No bleeding noticed. He is just tired of hurting and wants to know what he should do.

## 2015-04-21 NOTE — Telephone Encounter (Signed)
Pt called- he continues to have abd pain and he feels like it is getting worse. He cant sleep, cant work. He wants to know if he needs a procedure? (egd) he said it is the same pain and he has tried everything and it is not getting any better.  Please advise.

## 2015-04-22 NOTE — Telephone Encounter (Signed)
Trial Protonix. I sent already to the pharmacy. Where exactly is his pain?   He could trial Amitiza 24 mcg BID ALONG WITH Miralax daily to BID. I don't think he has tried that combo before.

## 2015-04-22 NOTE — Telephone Encounter (Signed)
Pt said it feels like his pain is in his stomach this time.

## 2015-04-22 NOTE — Telephone Encounter (Signed)
Upper abdomen? Lower? Exactly where. Is he able to describe the location?

## 2015-04-22 NOTE — Telephone Encounter (Signed)
Upper abd. He said the pain felt like it was actually coming from the inside of his stomach. That's why he was wondering if he needed an EGD. He has been taking omeprazole and zantac.

## 2015-04-22 NOTE — Telephone Encounter (Signed)
Yes, I agree with an EGD. That can be set up when we see him. Needs to start Protonix, as this is different from Prilosec. He has not been taking any PPI scheduled, only prn. We need to give this a chance to work. I reviewed labs in chart from the ED visit. Unimpressive findings.

## 2015-04-23 NOTE — Telephone Encounter (Signed)
Tried to call with no answer  

## 2015-04-28 NOTE — Telephone Encounter (Signed)
Pt is aware. I changed his appt to Monday 05/03/15 with [facility] to get him in sooner. Pt said he would be here.

## 2015-04-28 NOTE — Telephone Encounter (Signed)
Tried to call pt- NA- LMOM 

## 2015-05-03 ENCOUNTER — Ambulatory Visit: Payer: 59 | Admitting: Gastroenterology

## 2015-05-03 ENCOUNTER — Encounter (HOSPITAL_COMMUNITY): Payer: Self-pay | Admitting: Emergency Medicine

## 2015-05-03 ENCOUNTER — Emergency Department (HOSPITAL_COMMUNITY): Payer: 59

## 2015-05-03 ENCOUNTER — Emergency Department (HOSPITAL_COMMUNITY)
Admission: EM | Admit: 2015-05-03 | Discharge: 2015-05-03 | Disposition: A | Discharge status: Home / Self Care | Payer: 59 | Attending: Emergency Medicine | Admitting: Emergency Medicine

## 2015-05-03 DIAGNOSIS — Z720 Tobacco use: Secondary | ICD-10-CM | POA: Insufficient documentation

## 2015-05-03 DIAGNOSIS — Z8719 Personal history of other diseases of the digestive system: Secondary | ICD-10-CM | POA: Diagnosis not present

## 2015-05-03 DIAGNOSIS — J441 Chronic obstructive pulmonary disease with (acute) exacerbation: Secondary | ICD-10-CM | POA: Insufficient documentation

## 2015-05-03 DIAGNOSIS — Z79899 Other long term (current) drug therapy: Secondary | ICD-10-CM | POA: Insufficient documentation

## 2015-05-03 DIAGNOSIS — R0602 Shortness of breath: Secondary | ICD-10-CM | POA: Diagnosis present

## 2015-05-03 LAB — COMPREHENSIVE METABOLIC PANEL
ALBUMIN: 2.8 g/dL — AB (ref 3.5–5.0)
ALK PHOS: 80 U/L (ref 38–126)
ALT: 25 U/L (ref 17–63)
ANION GAP: 7 (ref 5–15)
AST: 33 U/L (ref 15–41)
BILIRUBIN TOTAL: 0.5 mg/dL (ref 0.3–1.2)
BUN: 6 mg/dL (ref 6–20)
CALCIUM: 7.8 mg/dL — AB (ref 8.9–10.3)
CO2: 35 mmol/L — ABNORMAL HIGH (ref 22–32)
Chloride: 93 mmol/L — ABNORMAL LOW (ref 101–111)
Creatinine, Ser: 0.7 mg/dL (ref 0.61–1.24)
GFR calc Af Amer: >60 mL/min (ref >60)
GLUCOSE: 138 mg/dL — AB (ref 65–99)
Potassium: 3 mmol/L — ABNORMAL LOW (ref 3.5–5.1)
Sodium: 135 mmol/L (ref 135–145)
TOTAL PROTEIN: 6.9 g/dL (ref 6.5–8.1)

## 2015-05-03 LAB — CBC WITH DIFFERENTIAL/PLATELET
BASOS PCT: 0 % (ref 0–1)
Basophils Absolute: 0 10*3/uL (ref 0.0–0.1)
Eosinophils Absolute: 0 10*3/uL (ref 0.0–0.7)
Eosinophils Relative: 1 % (ref 0–5)
HEMATOCRIT: 41.8 % (ref 39.0–52.0)
Hemoglobin: 13.8 g/dL (ref 13.0–17.0)
LYMPHS ABS: 0.9 10*3/uL (ref 0.7–4.0)
LYMPHS PCT: 27 % (ref 12–46)
MCH: 29 pg (ref 26.0–34.0)
MCHC: 33 g/dL (ref 30.0–36.0)
MCV: 87.8 fL (ref 78.0–100.0)
MONO ABS: 0.3 10*3/uL (ref 0.1–1.0)
MONOS PCT: 8 % (ref 3–12)
NEUTROS PCT: 64 % (ref 43–77)
Neutro Abs: 2.2 10*3/uL (ref 1.7–7.7)
Platelets: 135 10*3/uL — ABNORMAL LOW (ref 150–400)
RBC: 4.76 MIL/uL (ref 4.22–5.81)
RDW: 13.6 % (ref 11.5–15.5)
WBC: 3.4 10*3/uL — ABNORMAL LOW (ref 4.0–10.5)

## 2015-05-03 MED ORDER — IPRATROPIUM BROMIDE HFA 17 MCG/ACT IN AERS: 2.0000 | INHALATION_SPRAY | Freq: Four times a day (QID) | RESPIRATORY_TRACT | Status: DC | PRN

## 2015-05-03 MED ORDER — PREDNISONE 10 MG PO TABS: 20.0000 mg | ORAL_TABLET | Freq: Every day | ORAL | Status: DC

## 2015-05-03 MED ORDER — METHYLPREDNISOLONE SODIUM SUCC 125 MG IJ SOLR
125.0000 mg | Freq: Once | INTRAMUSCULAR | Status: AC
  Administered 2015-05-03: 125 mg via INTRAVENOUS
  Filled 2015-05-03: qty 2

## 2015-05-03 MED ORDER — POTASSIUM CHLORIDE CRYS ER 20 MEQ PO TBCR
40.0000 meq | EXTENDED_RELEASE_TABLET | Freq: Once | ORAL | Status: AC
  Administered 2015-05-03: 40 meq via ORAL
  Filled 2015-05-03: qty 2

## 2015-05-03 MED ORDER — IPRATROPIUM-ALBUTEROL 0.5-2.5 (3) MG/3ML IN SOLN
3.0000 mL | Freq: Once | RESPIRATORY_TRACT | Status: AC
  Administered 2015-05-03: 3 mL via RESPIRATORY_TRACT
  Filled 2015-05-03: qty 3

## 2015-05-03 MED ORDER — ALBUTEROL SULFATE (2.5 MG/3ML) 0.083% IN NEBU
2.5000 mg | INHALATION_SOLUTION | Freq: Once | RESPIRATORY_TRACT | Status: AC
  Administered 2015-05-03: 2.5 mg via RESPIRATORY_TRACT
  Filled 2015-05-03: qty 3

## 2015-05-03 NOTE — Discharge Instructions (Signed)
Follow up with your family md in 2-3 days for recheck.  Use this new inhaler also every 6 hours as needed

## 2015-05-03 NOTE — ED Provider Notes (Signed)
CSN: [id number]     Arrival date & time 05/03/15  1113 History  This chart was scribed for Jeffrey Ferguson, MD by Hilda Lias, ED Scribe. This patient was seen in room [facility] and the patient's care was started at 12:01 PM.  Chief Complaint  Patient presents with  . Shortness of Breath     Patient is a 59 y.o. male presenting with shortness of breath. The history is provided by the patient (the pt states he was at his md office and his O2 was low). No language interpreter was used.  Shortness of Breath Severity:  Moderate Onset quality:  Gradual Duration:  3 days Timing:  Constant Progression:  Worsening Chronicity:  New Context: known allergens   Relieved by:  Inhaler Associated symptoms: no abdominal pain, no chest pain, no cough, no headaches and no rash    HPI Comments: Jeffrey Gutierrez is a 59 y.o. male who presents to the Emergency Department complaining of SOB with associated low oxygen levels that have been present for 3 days. Pt states he saw his PCP this morning for sinus congestion and allergy-related symptoms and was told that his oxygen levels were below normal, and to come to ED. Pt states he does not use oxygen at home, but does use Albuterol. Pt states he received a dose of albuterol treatment today at his PCP prior to coming to ED. Pt denies any pain or SOB currently.    Past Medical History  Diagnosis Date  . PBC (primary biliary cirrhosis)     diagnosed 2006  . S/P colonoscopy 2009    pancolonic diverticula, hyperplastic rectal polyps  . Hyperplastic rectal polyp   . Diverticulitis    Past Surgical History  Procedure Laterality Date  . Cholecystectomy    . Liver biopsy    . Nasal septum surgery    . Arm surgery    . Inguinal hernia repair  Feb 2012    Dr. Arnoldo Morale   . Colonoscopy  2009    pancolonic diverticula, hyperplastic rectal polyps  . Colonoscopy [name] 03/24/2015    RMR: Single colonic polyp removed as described above. Pancolonic diverticulosis.  Abnormal sigmoid colon consistent with stigmata of recent diverticulitis.    Family History  Problem Relation Age of Onset  . Pneumonia Father   . Cancer Mother    Social History  Substance Use Topics  . Smoking status: Current Every Day Smoker -- 1.50 packs/day for 43 years    Types: Cigarettes  . Smokeless tobacco: None  . Alcohol Use: Yes     Comment: maybe once a week    Review of Systems  Constitutional: Negative for appetite change and fatigue.  HENT: Positive for congestion. Negative for ear discharge and sinus pressure.   Eyes: Negative for discharge.  Respiratory: Positive for shortness of breath. Negative for cough.   Cardiovascular: Negative for chest pain.  Gastrointestinal: Negative for abdominal pain and diarrhea.  Genitourinary: Negative for frequency and hematuria.  Musculoskeletal: Negative for back pain.  Skin: Negative for rash.  Neurological: Negative for seizures and headaches.  Psychiatric/Behavioral: Negative for hallucinations.  All other systems reviewed and are negative.     Allergies  Hydrocodone  Home Medications   Prior to Admission medications   Medication Sig Start Date End Date Taking? Authorizing Provider  acetaminophen (TYLENOL) 500 MG tablet Take 500 mg by mouth every 6 (six) hours as needed for moderate pain or headache.    Historical Provider, MD  Fiber, Guar Gum, CHEW  Chew 2 tablets by mouth 2 (two) times daily.    Historical Provider, MD  HYDROmorphone (DILAUDID) 4 MG tablet Take 1 tablet (4 mg total) by mouth every 6 (six) hours as needed for severe pain. 04/05/15   Fredia Sorrow, MD  Multiple Vitamin (MULTIVITAMIN WITH MINERALS) TABS tablet Take 1 tablet by mouth daily.    Historical Provider, MD  pantoprazole (PROTONIX) 40 MG tablet Take 1 tablet (40 mg total) by mouth daily. 04/21/15   Orvil Feil, NP  polyethylene glycol [facility] / Floria Raveling) packet Take 17 g by mouth daily.    Historical Provider, MD  PROAIR HFA 108 (90 BASE)  MCG/ACT inhaler Inhale 2 puffs into the lungs every 6 (six) hours as needed for wheezing or shortness of breath.  01/06/15   Historical Provider, MD   BP 139/90 mmHg  Temp(Src) 98.5 F (36.9 C) (Oral)  Resp 13  Ht '6\' 1"'$  (1.854 m)  Wt 198 lb (89.812 kg)  BMI 26.13 kg/m2  SpO2 91% Physical Exam  Constitutional: He is oriented to person, place, and time. He appears well-developed.  HENT:  Head: Normocephalic.  Eyes: Conjunctivae and EOM are normal. No scleral icterus.  Neck: Neck supple. No thyromegaly present.  Cardiovascular: Normal rate and regular rhythm.  Exam reveals no gallop and no friction rub.   No murmur heard. Pulmonary/Chest: No stridor. He has no wheezes. He has no rales. He exhibits no tenderness.  Abdominal: He exhibits no distension. There is no tenderness. There is no rebound.  Musculoskeletal: Normal range of motion. He exhibits no edema.  Lymphadenopathy:    He has no cervical adenopathy.  Neurological: He is oriented to person, place, and time. He exhibits normal muscle tone. Coordination normal.  Skin: No rash noted. No erythema.  Psychiatric: He has a normal mood and affect. His behavior is normal.    ED Course  Procedures (including critical care time)  DIAGNOSTIC STUDIES: Oxygen Saturation is 91% on [place], low by my interpretation.    COORDINATION OF CARE: 12:05 PM Discussed treatment plan with pt at bedside and pt agreed to plan.   Labs Review Labs Reviewed - No data to display  Imaging Review No results found.    EKG Interpretation   Date/Time:  Monday May 03 2015 11:20:49 EDT Ventricular Rate:  84 PR Interval:  145 QRS Duration: 111 QT Interval:  389 QTC Calculation: 460 R Axis:   142 Text Interpretation:  Sinus rhythm Atrial premature complexes Probable  right ventricular hypertrophy No significant change since last tracing  Confirmed by KNAPP  MD-J, JON ([id number]) on 05/03/2015 11:28:48 AM      MDM   Final diagnoses:  None     Copd exacerbation,  Labs unremarkable, doubt pneumonia or pe,  tx with prednisone and atrovent inhaler with pcp follow up The chart was scribed for me under my direct supervision.  I personally performed the history, physical, and medical decision making and all procedures in the evaluation of this patient.Jeffrey Ferguson, MD 05/03/15 [phone]

## 2015-05-03 NOTE — ED Notes (Signed)
Patient with no complaints at this time. Respirations even and unlabored. Skin warm/dry. Discharge instructions reviewed with patient at this time. Patient given opportunity to voice concerns/ask questions. IV removed per policy and band-aid applied to site. Patient discharged at this time and left Emergency Department with steady gait.  

## 2015-05-03 NOTE — ED Notes (Signed)
Oxygen sats 87% on RA. MD notified of RA O2 sat and ok with D/C home.

## 2015-05-03 NOTE — ED Notes (Signed)
Per EMS:  Pt from [facility] sent by dr. For SOB since Friday.  Pt has intermittent sob with exertion, 02 sat 81 at office today with no SOB. 2L applied and pt sats 93%. Lungs clear.  Sat when down to 85 when taking pt off of 02.  Denies pain or sob at this time.  Pt given albuterol tx at office. Pt alert and oriented.  Pt has been told he has beginning stages of copd.   143/97, 88pulse,

## 2015-05-07 ENCOUNTER — Ambulatory Visit: Payer: 59 | Admitting: Gastroenterology

## 2015-05-07 ENCOUNTER — Encounter: Payer: Self-pay | Admitting: Gastroenterology

## 2015-05-07 ENCOUNTER — Telehealth: Payer: Self-pay | Admitting: Gastroenterology

## 2015-05-07 NOTE — Telephone Encounter (Signed)
Pt was a no show

## 2015-05-07 NOTE — Telephone Encounter (Signed)
Letter mailed

## 2015-05-10 ENCOUNTER — Ambulatory Visit: Payer: Self-pay | Admitting: Nurse Practitioner

## 2015-06-24 ENCOUNTER — Encounter: Payer: Self-pay | Admitting: Nurse Practitioner

## 2015-06-24 ENCOUNTER — Ambulatory Visit (INDEPENDENT_AMBULATORY_CARE_PROVIDER_SITE_OTHER): Payer: 59 | Admitting: Nurse Practitioner

## 2015-06-24 ENCOUNTER — Encounter: Payer: Self-pay | Admitting: Internal Medicine

## 2015-06-24 ENCOUNTER — Other Ambulatory Visit: Payer: Self-pay

## 2015-06-24 ENCOUNTER — Ambulatory Visit (INDEPENDENT_AMBULATORY_CARE_PROVIDER_SITE_OTHER): Payer: 59 | Admitting: Cardiology

## 2015-06-24 ENCOUNTER — Encounter: Payer: Self-pay | Admitting: Cardiology

## 2015-06-24 VITALS — BP 144/82 | HR 76 | Temp 97.6°F | Ht 73.0 in | Wt 184.2 lb

## 2015-06-24 VITALS — BP 156/80 | HR 72 | Ht 72.0 in | Wt 181.0 lb

## 2015-06-24 DIAGNOSIS — Z720 Tobacco use: Secondary | ICD-10-CM

## 2015-06-24 DIAGNOSIS — R1013 Epigastric pain: Secondary | ICD-10-CM

## 2015-06-24 DIAGNOSIS — R0602 Shortness of breath: Secondary | ICD-10-CM

## 2015-06-24 DIAGNOSIS — I10 Essential (primary) hypertension: Secondary | ICD-10-CM

## 2015-06-24 DIAGNOSIS — J439 Emphysema, unspecified: Secondary | ICD-10-CM | POA: Diagnosis not present

## 2015-06-24 DIAGNOSIS — K743 Primary biliary cirrhosis: Secondary | ICD-10-CM

## 2015-06-24 DIAGNOSIS — R109 Unspecified abdominal pain: Secondary | ICD-10-CM

## 2015-06-24 NOTE — Progress Notes (Signed)
Cardiology Office Note  Date: 06/24/2015   ID: Jeffrey Gutierrez, DOB June 11, 1956, MRN [id number]  PCP: Purvis Kilts, MD  Primary Cardiologist: Rozann Lesches, MD   Chief Complaint  Patient presents with  . Shortness of Breath    History of Present Illness: Jeffrey Gutierrez is a 59 y.o. male referred for cardiology consultation by Ms. Glennon Mac PA-C. I reviewed his chart. He has had progressive dyspnea on exertion with intermittent cough and wheezing, also documented hypoxia on room air over the last few months. He is a 70 year smoker with evidence of COPD based on chest imaging. He had a CT scan of the chest in March 2015 that described moderate emphysematous changes at that time. He does not report recent PFTs, although states that he had been several years ago.  Record review finds ER visit back in August with shortness of breath and hypoxia. He was felt to have COPD exacerbation and treated with steroids as well as MDIs. He states that he had marginal improvement with these interventions.  He is trying to cut back on smoking, has not been able to quit. 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  He does not endorse any exertional chest pain or palpitations. Recent ECG is reviewed below. Most recent chest x-ray from August also showed emphysematous changes with no evidence of pulmonary edema or pleural effusions.  He has not had an echocardiogram to assess cardiac structure and function.   Past Medical History  Diagnosis Date  . PBC (primary biliary cirrhosis) ([place])     Diagnosed 2006  . S/P colonoscopy 2009    Pancolonic diverticula, hyperplastic rectal polyps  . Hyperplastic rectal polyp   . Diverticulitis   . COPD (chronic obstructive pulmonary disease) ([place])   . Essential hypertension     Past Surgical History  Procedure Laterality Date  . Cholecystectomy    . Liver biopsy    . Nasal septum surgery    . Arm surgery     . Inguinal hernia repair  Feb 2012    Dr. Arnoldo Morale   . Colonoscopy  2009    Pancolonic diverticula, hyperplastic rectal polyps  . Colonoscopy [name] 03/24/2015    RMR: Single colonic polyp removed as described above. Pancolonic diverticulosis. Abnormal sigmoid colon consistent with stigmata of recent diverticulitis.     Current Outpatient Prescriptions  Medication Sig Dispense Refill  . acetaminophen (TYLENOL) 500 MG tablet Take 500 mg by mouth every 6 (six) hours as needed for moderate pain or headache.    . Fiber, Guar Gum, CHEW Chew 2 tablets by mouth 2 (two) times daily.    . furosemide (LASIX) 20 MG tablet Take 20 mg by mouth daily.    Marland Kitchen ipratropium (ATROVENT HFA) 17 MCG/ACT inhaler Inhale 2 puffs into the lungs every 6 (six) hours as needed for wheezing. 1 Inhaler 12  . lisinopril (PRINIVIL,ZESTRIL) 10 MG tablet Take 10 mg by mouth daily.  2  . Multiple Vitamin (MULTIVITAMIN WITH MINERALS) TABS tablet Take 1 tablet by mouth daily.    . pantoprazole (PROTONIX) 40 MG tablet Take 1 tablet (40 mg total) by mouth daily. 90 tablet 3  . polyethylene glycol (MIRALAX / GLYCOLAX) packet Take 17 g by mouth daily.    Marland Kitchen PROAIR HFA 108 (90 BASE) MCG/ACT inhaler Inhale 2 puffs into the lungs every 6 (six) hours as needed for wheezing or shortness of breath.   10  .  Tiotropium Bromide-Olodaterol (STIOLTO RESPIMAT) 2.5-2.5 MCG/ACT AERS Inhale into the lungs.     No current facility-administered medications for this visit.    Allergies:  Hydrocodone   Social History: The patient  reports that he has been smoking Cigarettes.  He has a 21.5 pack-year smoking history. He does not have any smokeless tobacco history on file. He reports that he drinks alcohol. He reports that he does not use illicit drugs.   Family History: The patient's family history includes Lung cancer in his father and mother; Pancreatitis in his father; Pneumonia in his father.   ROS:  Please see the history of present illness.  Otherwise, complete review of systems is positive for NYHA class 2-3 dyspnea at times, improve cough recently. No fevers or chills..  All other systems are reviewed and negative.   Physical Exam: VS:  BP 156/80 mmHg  Pulse 72  Ht 6' (1.829 m)  Wt 181 lb (82.101 kg)  BMI 24.54 kg/m2  SpO2 88%, BMI Body mass index is 24.54 kg/(m^2).  Wt Readings from Last 3 Encounters:  06/24/15 181 lb (82.101 kg)  05/03/15 198 lb (89.812 kg)  04/05/15 200 lb (90.719 kg)     General: Somewhat chronically ill-appearing male in no distress. HEENT: Conjunctiva and lids normal, oropharynx clear. Neck: Supple, no elevated JVP or carotid bruits, no thyromegaly. Lungs: Decreased breath sounds throughout without wheezing, prolonged expiratory phase, nonlabored breathing at rest. Cardiac: Regular rate and rhythm, no S3 or significant systolic murmur, no pericardial rub. Abdomen: Soft, nontender, bowel sounds present, no guarding or rebound. Extremities: No pitting edema, distal pulses 2+. Skin: Warm and dry. Musculoskeletal: No kyphosis. Neuropsychiatric: Alert and oriented x3, affect grossly appropriate.   ECG: Tracing from 05/03/2015 showed sinus rhythm with right bundle branch block and PVC as well as PACs.  Recent Labwork: 05/03/2015: ALT 25; AST 33; BUN 6; Creatinine, Ser 0.70; Hemoglobin 13.8; Platelets 135*; Potassium 3.0*; Sodium 135   Other Studies Reviewed Today:  Chest x-ray 05/03/2015: FINDINGS: The lungs are emphysematous but clear. Heart size is normal. No pneumothorax or pleural effusion.  IMPRESSION: Emphysema without acute disease.  ASSESSMENT AND PLAN:  1. Shortness of breath, recent cough and wheezing, and also hypoxia documented in the last few months. This is most consistent with progressive lung disease particular in light of long-standing tobacco use history and documentation of emphysema by chest imaging. He is referred for cardiac evaluation, and we will obtain an  echocardiogram to assess cardiac structure and function. Unless this is substantially abnormal and requires further evaluation however, I would recommend that Jeffrey Gutierrez have full PFTs and referral to a Pulmonologist for further evaluation.  2. Long-standing tobacco abuse, patient trying to cut back and would like to quit.  3. Essential hypertension, on lisinopril and Lasix. The pressure is elevated today. Keep follow-up with primary care provider.  4. History of primary biliary cirrhosis, has been evaluated by Dr. Gala Romney.  Current medicines were reviewed at length with the patient today.   Orders Placed This Encounter  Procedures  . Echocardiogram    Disposition: Call with results.   Signed, Satira Sark, MD, [facility] 06/24/2015 8:58 AM    [facility]. [address], [place], [place] [address] Phone: [phone]; Fax: [phone]

## 2015-06-24 NOTE — Progress Notes (Signed)
Referring Provider: Sharilyn Sites, MD Primary Care Physician:  Purvis Kilts, MD Primary GI:  Dr. Gala Romney  Chief Complaint  Patient presents with  . Constipation    HPI:   59 year old male presents for follow-up on constipation. Last colonoscopy 03/24/2015 for abnormal sigmoid colon on CT with clinical bout of diverticulitis 6 months ago. Findings include single colon polyp measuring 5 mm in the sigmoid colon, pancolonic diverticulosis, abnormal sigmoid colon consistent with stigmata of recent diverticulitis. Recommend Benefiber 2 teaspoons twice daily, MiraLAX 1-2 times daily, follow-up with LFTs. Surgical pathology of the polyp showed inflammatory polyp. Per telephone notes in the system patient was having about of abdominal pain and constipation round July this month he was recommended to add Amitiza to his twice daily MiraLAX. No further communication until now. Last office visit 03/10/2015 notes abdominal ultrasound 1 month prior with normal CBD diameter and questionable fatty liver.  Today he states he had intolerable diarrhea with Linzess. States he never received the Amitiza recommended in the phone note. Currently taking Miralax bid and fiber chews. Is also taking Protonix '40mg'$  once daily. He noted minimal improvement in abdominal pain with starting Protonix. Abdominal pain at upper abdomen. Described as "like someone kicking me in the gut" and is intermittent. Lasts a variable amount "sometimes all day."  Nothing he can think of has made it better or worse. Not diet dependent. Has once bowel movement a day which is soft. Asked if feels like he fully empties his bowels after a bowel movement and said "I dont know." No improvement in pain after bowel movement, "sometimes it's worse." Sometimes pain is worse after eating. Recent CT abdomen/pelvis with "Extensive diverticulosis with associated wall thickening of the sigmoid colon. No evidence of active inflammatory change is identified.  Status post cholecystectomy." Denies chest pain, dyspnea, dizziness, lightheadedness, syncope, near syncope. Denies any other upper or lower GI symptoms.     Past Medical History  Diagnosis Date  . PBC (primary biliary cirrhosis) ([place])     Diagnosed 2006  . S/P colonoscopy 2009    Pancolonic diverticula, hyperplastic rectal polyps  . Hyperplastic rectal polyp   . Diverticulitis   . COPD (chronic obstructive pulmonary disease) ([place])   . Essential hypertension     Past Surgical History  Procedure Laterality Date  . Cholecystectomy    . Liver biopsy    . Nasal septum surgery    . Arm surgery    . Inguinal hernia repair  Feb 2012    Dr. Arnoldo Morale   . Colonoscopy  2009    Pancolonic diverticula, hyperplastic rectal polyps  . Colonoscopy [name] 03/24/2015    RMR: Single colonic polyp removed as described above. Pancolonic diverticulosis. Abnormal sigmoid colon consistent with stigmata of recent diverticulitis.     Current Outpatient Prescriptions  Medication Sig Dispense Refill  . acetaminophen (TYLENOL) 500 MG tablet Take 500 mg by mouth every 6 (six) hours as needed for moderate pain or headache.    . Fiber, Guar Gum, CHEW Chew 2 tablets by mouth 2 (two) times daily.    . furosemide (LASIX) 20 MG tablet Take 20 mg by mouth daily.    Marland Kitchen ipratropium (ATROVENT HFA) 17 MCG/ACT inhaler Inhale 2 puffs into the lungs every 6 (six) hours as needed for wheezing. 1 Inhaler 12  . lisinopril (PRINIVIL,ZESTRIL) 10 MG tablet Take 10 mg by mouth daily.  2  . Multiple Vitamin (MULTIVITAMIN WITH MINERALS) TABS tablet Take 1 tablet by mouth daily.    Marland Kitchen  pantoprazole (PROTONIX) 40 MG tablet Take 1 tablet (40 mg total) by mouth daily. 90 tablet 3  . polyethylene glycol (MIRALAX / GLYCOLAX) packet Take 17 g by mouth daily.    Marland Kitchen PROAIR HFA 108 (90 BASE) MCG/ACT inhaler Inhale 2 puffs into the lungs every 6 (six) hours as needed for wheezing or shortness of breath.   10  . Tiotropium Bromide-Olodaterol  (STIOLTO RESPIMAT) 2.5-2.5 MCG/ACT AERS Inhale into the lungs.    . Linaclotide (LINZESS) 145 MCG CAPS capsule Take 145 mcg by mouth daily.     No current facility-administered medications for this visit.    Allergies as of 06/24/2015 - Review Complete 06/24/2015  Allergen Reaction Noted  . Hydrocodone Anaphylaxis and Other (See Comments)     Family History  Problem Relation Age of Onset  . Pneumonia Father   . Lung cancer Mother   . Lung cancer Father   . Pancreatitis Father     Social History   Social History  . Marital Status: Married    Spouse Name: [name]  . Number of Children: [name]  . Years of Education: [name]   Social History Main Topics  . Smoking status: Current Some Day Smoker -- 0.50 packs/day for 43 years    Types: Cigarettes  . Smokeless tobacco: None  . Alcohol Use: 0.0 oz/week    0 Standard drinks or equivalent per week     Comment: Maybe once a week  . Drug Use: No  . Sexual Activity:    Partners: Male   Other Topics Concern  . None   Social History Narrative    Review of Systems: General: Negative for anorexia, weight loss, fever, chills, fatigue, weakness. Eyes: Negative for vision changes.  ENT: Negative for hoarseness, difficulty swallowing. CV: Negative for chest pain, angina, palpitations, peripheral edema.  Respiratory: Negative for dyspnea at rest, cough, sputum, wheezing.  GI: See history of present illness. Endo: Negative for unusual weight change.    Physical Exam: BP 144/82 mmHg  Pulse 76  Temp(Src) 97.6 F (36.4 C) (Oral)  Ht '6\' 1"'$  (1.854 m)  Wt 184 lb 3.2 oz (83.553 kg)  BMI 24.31 kg/m2 General:   Alert and oriented. Pleasant and cooperative. Well-nourished and well-developed.  Head:  Normocephalic and atraumatic. Eyes:  Without icterus, sclera clear and conjunctiva pink.  Ears:  Normal auditory acuity. Cardiovascular:  S1, S2 present without murmurs appreciated. Normal pulses noted. Extremities without clubbing or  edema. Respiratory:  Clear to auscultation bilaterally. No wheezes, rales, or rhonchi. No distress.  Gastrointestinal:  +BS, soft, and non-distended. Mild to moderate epigastric TTP. No HSM noted. No guarding or rebound. No masses appreciated.  Rectal:  Deferred  Neurologic:  Alert and oriented x4;  grossly normal neurologically. Psych:  Alert and cooperative. Normal mood and affect.    06/24/2015 11:40 AM

## 2015-06-24 NOTE — Patient Instructions (Signed)
1. We will schedule your procedure for you. 2. Return for follow-up in 3 months. 3. Further recommendations to be based on results of your procedure.

## 2015-06-24 NOTE — Patient Instructions (Signed)
Your physician recommends that you schedule a follow-up appointment in: to be determined after echo     Your physician has requested that you have an echocardiogram. Echocardiography is a painless test that uses sound waves to create images of your heart. It provides your doctor with information about the size and shape of your heart and how well your heart's chambers and valves are working. This procedure takes approximately one hour. There are no restrictions for this procedure.    Your physician recommends that you continue on your current medications as directed. Please refer to the Current Medication list given to you today.     Thank you for choosing [facility] !

## 2015-06-25 NOTE — Assessment & Plan Note (Addendum)
Patient with continued abdominal pain and constipation. Had intolerable side effects on Linzess. Was given Amitiza over the phone, however he states he never received this. Colonoscopy up-to-date completed 03/24/2015 with single polyp removal and sigmoid colon consistent with stigmata of recent diverticulitis ultrasound completed 03/10/2015 found normal CBD diameter and questionable fatty liver. Recent CT of the abdomen/pelvis found extensive diverticulosis with associated wall thickening of the sigmoid colon, or active inflammation, status post cholecystectomy. Starting on PPI has had a mild improvement in his symptoms. Continues to have epigastric pain. At this point to complete his workup we will proceed with an upper endoscopy to evaluate his symptoms further. We'll also provide samples of Amitiza to help with his constipation. Return for follow-up in 3 months.  Of note his LFTs are up-to-date and normal and can be rechecked at his next follow-up visit.  Proceed with EGD with Dr. Gala Romney in near future: the risks, benefits, and alternatives have been discussed with the patient in detail. The patient states understanding and desires to proceed.  The patient is not on any anticoagulants, anxiolytics, chronic pain medications, or antidepressants. Conscious sedation should likely be adequate for this procedure.

## 2015-06-28 ENCOUNTER — Other Ambulatory Visit (HOSPITAL_COMMUNITY): Payer: 59

## 2015-06-28 ENCOUNTER — Other Ambulatory Visit: Payer: Self-pay

## 2015-06-28 ENCOUNTER — Encounter (HOSPITAL_COMMUNITY): Payer: Self-pay | Admitting: *Deleted

## 2015-06-28 ENCOUNTER — Ambulatory Visit (HOSPITAL_COMMUNITY)
Admission: RE | Admit: 2015-06-28 | Discharge: 2015-06-28 | Disposition: A | Discharge status: Home / Self Care | Payer: 59 | Source: Ambulatory Visit | Attending: Internal Medicine | Admitting: Internal Medicine

## 2015-06-28 ENCOUNTER — Encounter (HOSPITAL_COMMUNITY)
Admission: RE | Disposition: A | Discharge status: Home / Self Care | Payer: Self-pay | Source: Ambulatory Visit | Attending: Internal Medicine

## 2015-06-28 DIAGNOSIS — F17210 Nicotine dependence, cigarettes, uncomplicated: Secondary | ICD-10-CM | POA: Insufficient documentation

## 2015-06-28 DIAGNOSIS — K2210 Ulcer of esophagus without bleeding: Secondary | ICD-10-CM | POA: Diagnosis not present

## 2015-06-28 DIAGNOSIS — R1013 Epigastric pain: Secondary | ICD-10-CM | POA: Insufficient documentation

## 2015-06-28 DIAGNOSIS — K210 Gastro-esophageal reflux disease with esophagitis: Secondary | ICD-10-CM | POA: Insufficient documentation

## 2015-06-28 DIAGNOSIS — I10 Essential (primary) hypertension: Secondary | ICD-10-CM | POA: Diagnosis not present

## 2015-06-28 DIAGNOSIS — Z79899 Other long term (current) drug therapy: Secondary | ICD-10-CM | POA: Insufficient documentation

## 2015-06-28 DIAGNOSIS — K743 Primary biliary cirrhosis: Secondary | ICD-10-CM

## 2015-06-28 DIAGNOSIS — K209 Esophagitis, unspecified without bleeding: Secondary | ICD-10-CM | POA: Insufficient documentation

## 2015-06-28 DIAGNOSIS — R109 Unspecified abdominal pain: Secondary | ICD-10-CM

## 2015-06-28 DIAGNOSIS — K449 Diaphragmatic hernia without obstruction or gangrene: Secondary | ICD-10-CM | POA: Diagnosis not present

## 2015-06-28 DIAGNOSIS — J449 Chronic obstructive pulmonary disease, unspecified: Secondary | ICD-10-CM | POA: Diagnosis not present

## 2015-06-28 HISTORY — PX: ESOPHAGOGASTRODUODENOSCOPY: SHX5428

## 2015-06-28 SURGERY — EGD (ESOPHAGOGASTRODUODENOSCOPY)
Anesthesia: Moderate Sedation

## 2015-06-28 MED ORDER — ONDANSETRON HCL 4 MG/2ML IJ SOLN
INTRAMUSCULAR | Status: DC | PRN
  Administered 2015-06-28: 4 mg via INTRAVENOUS

## 2015-06-28 MED ORDER — LIDOCAINE VISCOUS 2 % MT SOLN
OROMUCOSAL | Status: DC | PRN
  Administered 2015-06-28: 3 mL via OROMUCOSAL

## 2015-06-28 MED ORDER — MEPERIDINE HCL 100 MG/ML IJ SOLN
INTRAMUSCULAR | Status: AC
  Filled 2015-06-28: qty 2

## 2015-06-28 MED ORDER — ONDANSETRON HCL 4 MG/2ML IJ SOLN
INTRAMUSCULAR | Status: AC
  Filled 2015-06-28: qty 2

## 2015-06-28 MED ORDER — LIDOCAINE VISCOUS 2 % MT SOLN
OROMUCOSAL | Status: AC
  Filled 2015-06-28: qty 15

## 2015-06-28 MED ORDER — MEPERIDINE HCL 100 MG/ML IJ SOLN
INTRAMUSCULAR | Status: DC | PRN
  Administered 2015-06-28: 50 mg via INTRAVENOUS
  Administered 2015-06-28: 25 mg via INTRAVENOUS

## 2015-06-28 MED ORDER — SODIUM CHLORIDE 0.9 % IV SOLN
INTRAVENOUS | Status: DC
  Administered 2015-06-28: 10:00:00 via INTRAVENOUS

## 2015-06-28 MED ORDER — MIDAZOLAM HCL 5 MG/5ML IJ SOLN
INTRAMUSCULAR | Status: AC
  Filled 2015-06-28: qty 10

## 2015-06-28 MED ORDER — MIDAZOLAM HCL 5 MG/5ML IJ SOLN
INTRAMUSCULAR | Status: DC | PRN
  Administered 2015-06-28 (×2): 2 mg via INTRAVENOUS

## 2015-06-28 MED ORDER — STERILE WATER FOR IRRIGATION IR SOLN
Status: DC | PRN
  Administered 2015-06-28: 11:00:00

## 2015-06-28 NOTE — Discharge Instructions (Signed)
EGD Discharge instructions Please read the instructions outlined below and refer to this sheet in the next few weeks. These discharge instructions provide you with general information on caring for yourself after you leave the hospital. Your doctor may also give you specific instructions. While your treatment has been planned according to the most current medical practices available, unavoidable complications occasionally occur. If you have any problems or questions after discharge, please call your doctor. ACTIVITY  You may resume your regular activity but move at a slower pace for the next 24 hours.   Take frequent rest periods for the next 24 hours.   Walking will help expel (get rid of) the air and reduce the bloated feeling in your abdomen.   No driving for 24 hours (because of the anesthesia (medicine) used during the test).   You may shower.   Do not sign any important legal documents or operate any machinery for 24 hours (because of the anesthesia used during the test).  NUTRITION  Drink plenty of fluids.   You may resume your normal diet.   Begin with a light meal and progress to your normal diet.   Avoid alcoholic beverages for 24 hours or as instructed by your caregiver.  MEDICATIONS  You may resume your normal medications unless your caregiver tells you otherwise.  WHAT YOU CAN EXPECT TODAY  You may experience abdominal discomfort such as a feeling of fullness or gas pains.  FOLLOW-UP  Your doctor will discuss the results of your test with you.  SEEK IMMEDIATE MEDICAL ATTENTION IF ANY OF THE FOLLOWING OCCUR:  Excessive nausea (feeling sick to your stomach) and/or vomiting.   Severe abdominal pain and distention (swelling).   Trouble swallowing.   Temperature over 101 F (37.8 C).   Rectal bleeding or vomiting of blood.   GERD and hiatal hernia information provided  Stop Protonix; begin Dexilant 60 mg daily-go by my office for free samples and try them  before you get your prescription filled  Ultrasound/ elastography of liver to further evaluate primary biliary cirrhosis  Further recommendations to follow  Hiatal Hernia A hiatal hernia occurs when part of your stomach slides above the muscle that separates your abdomen from your chest (diaphragm). You can be born with a hiatal hernia (congenital), or it may develop over time. In almost all cases of hiatal hernia, only the top part of the stomach pushes through.  Many people have a hiatal hernia with no symptoms. The larger the hernia, the more likely that you will have symptoms. In some cases, a hiatal hernia allows stomach acid to flow back into the tube that carries food from your mouth to your stomach (esophagus). This may cause heartburn symptoms. Severe heartburn symptoms may mean you have developed a condition called gastroesophageal reflux disease (GERD).  CAUSES  Hiatal hernias are caused by a weakness in the opening (hiatus) where your esophagus passes through your diaphragm to attach to the upper part of your stomach. You may be born with a weakness in your hiatus, or a weakness can develop. RISK FACTORS Older age is a major risk factor for a hiatal hernia. Anything that increases pressure on your diaphragm can also increase your risk of a hiatal hernia. This includes:  Pregnancy.  Excess weight.  Frequent constipation. SIGNS AND SYMPTOMS  People with a hiatal hernia often have no symptoms. If symptoms develop, they are almost always caused by GERD. They may include:  Heartburn.  Belching.  Indigestion.  Trouble swallowing.  Coughing or wheezing.  Sore throat.  Hoarseness.  Chest pain. DIAGNOSIS  A hiatal hernia is sometimes found during an exam for another problem. Your health care provider may suspect a hiatal hernia if you have symptoms of GERD. Tests may be done to diagnose GERD. These may include:  X-rays of your stomach or chest.  An upper gastrointestinal  (GI) series. This is an X-ray exam of your GI tract involving the use of a chalky liquid that you swallow. The liquid shows up clearly on the X-ray.  Endoscopy. This is a procedure to look into your stomach using a thin, flexible tube that has a tiny camera and light on the end of it. TREATMENT  If you have no symptoms, you may not need treatment. If you have symptoms, treatment may include:  Dietary and lifestyle changes to help reduce GERD symptoms.  Medicines. These may include:  Over-the-counter antacids.  Medicines that make your stomach empty more quickly.  Medicines that block the production of stomach acid (H2 blockers).  Stronger medicines to reduce stomach acid (proton pump inhibitors).  You may need surgery to repair the hernia if other treatments are not helping. HOME CARE INSTRUCTIONS   Take all medicines as directed by your health care provider.  Quit smoking, if you smoke.  Try to achieve and maintain a healthy body weight.  Eat frequent small meals instead of three large meals a day. This keeps your stomach from getting too full.  Eat slowly.  Do not lie down right after eating.  Do noteat 1-2 hours before bed.   Do not drink beverages with caffeine. These include cola, coffee, cocoa, and tea.  Do not drink alcohol.  Avoid foods that can make symptoms of GERD worse. These may include:  Fatty foods.  Citrus fruits.  Other foods and drinks that contain acid.  Avoid putting pressure on your belly. Anything that puts pressure on your belly increases the amount of acid that may be pushed up into your esophagus.   Avoid bending over, especially after eating.  Raise the head of your bed by putting blocks under the legs. This keeps your head and esophagus higher than your stomach.  Do not wear tight clothing around your chest or stomach.  Try not to strain when having a bowel movement, when urinating, or when lifting heavy objects. SEEK MEDICAL CARE  IF:  Your symptoms are not controlled with medicines or lifestyle changes.  You are having trouble swallowing.  You have coughing or wheezing that will not go away. SEEK IMMEDIATE MEDICAL CARE IF:  Your pain is getting worse.  Your pain spreads to your arms, neck, jaw, teeth, or back.  You have shortness of breath.  You sweat for no reason.  You feel sick to your stomach (nauseous) or vomit.  You vomit blood.  You have bright red blood in your stools.  You have black, tarry stools.    This information is not intended to replace advice given to you by your health care provider. Make sure you discuss any questions you have with your health care provider.   Document Released: 11/25/2003 Document Revised: 09/25/2014 Document Reviewed: 08/22/2013 Elsevier Interactive Patient Education 2016 [employer].   Gastroesophageal Reflux Disease, Adult Normally, food travels down the esophagus and stays in the stomach to be digested. However, when a person has gastroesophageal reflux disease (GERD), food and stomach acid move back up into the esophagus. When this happens, the esophagus becomes sore and inflamed. Over time,  GERD can create small holes (ulcers) in the lining of the esophagus.  CAUSES This condition is caused by a problem with the muscle between the esophagus and the stomach (lower esophageal sphincter, or LES). Normally, the LES muscle closes after food passes through the esophagus to the stomach. When the LES is weakened or abnormal, it does not close properly, and that allows food and stomach acid to go back up into the esophagus. The LES can be weakened by certain dietary substances, medicines, and medical conditions, including: Tobacco use. Pregnancy. Having a hiatal hernia. Heavy alcohol use. Certain foods and beverages, such as coffee, chocolate, onions, and peppermint. RISK FACTORS This condition is more likely to develop in: People who have an increased body  weight. People who have connective tissue disorders. People who use NSAID medicines. SYMPTOMS Symptoms of this condition include: Heartburn. Difficult or painful swallowing. The feeling of having a lump in the throat. Abitter taste in the mouth. Bad breath. Having a large amount of saliva. Having an upset or bloated stomach. Belching. Chest pain. Shortness of breath or wheezing. Ongoing (chronic) cough or a night-time cough. Wearing away of tooth enamel. Weight loss. Different conditions can cause chest pain. Make sure to see your health care provider if you experience chest pain. DIAGNOSIS Your health care provider will take a medical history and perform a physical exam. To determine if you have mild or severe GERD, your health care provider may also monitor how you respond to treatment. You may also have other tests, including: An endoscopy toexamine your stomach and esophagus with a small camera. A test thatmeasures the acidity level in your esophagus. A test thatmeasures how much pressure is on your esophagus. A barium swallow or modified barium swallow to show the shape, size, and functioning of your esophagus. TREATMENT The goal of treatment is to help relieve your symptoms and to prevent complications. Treatment for this condition may vary depending on how severe your symptoms are. Your health care provider may recommend: Changes to your diet. Medicine. Surgery. HOME CARE INSTRUCTIONS Diet Follow a diet as recommended by your health care provider. This may involve avoiding foods and drinks such as: Coffee and tea (with or without caffeine). Drinks that containalcohol. Energy drinks and sports drinks. Carbonated drinks or sodas. Chocolate and cocoa. Peppermint and mint flavorings. Garlic and onions. Horseradish. Spicy and acidic foods, including peppers, chili powder, curry powder, vinegar, hot sauces, and barbecue sauce. Citrus fruit juices and citrus fruits,  such as oranges, lemons, and limes. Tomato-based foods, such as red sauce, chili, salsa, and pizza with red sauce. Fried and fatty foods, such as donuts, french fries, potato chips, and high-fat dressings. High-fat meats, such as hot dogs and fatty cuts of red and white meats, such as rib eye steak, sausage, ham, and bacon. High-fat dairy items, such as whole milk, butter, and cream cheese. Eat small, frequent meals instead of large meals. Avoid drinking large amounts of liquid with your meals. Avoid eating meals during the 2-3 hours before bedtime. Avoid lying down right after you eat. Do not exercise right after you eat. General Instructions Pay attention to any changes in your symptoms. Take over-the-counter and prescription medicines only as told by your health care provider. Do not take aspirin, ibuprofen, or other NSAIDs unless your health care provider told you to do so. Do not use any tobacco products, including cigarettes, chewing tobacco, and e-cigarettes. If you need help quitting, ask your health care provider. Wear loose-fitting clothing. Do not  wear anything tight around your waist that causes pressure on your abdomen. Raise (elevate) the head of your bed 6 inches (15cm). Try to reduce your stress, such as with yoga or meditation. If you need help reducing stress, ask your health care provider. If you are overweight, reduce your weight to an amount that is healthy for you. Ask your health care provider for guidance about a safe weight loss goal. Keep all follow-up visits as told by your health care provider. This is important. SEEK MEDICAL CARE IF: You have new symptoms. You have unexplained weight loss. You have difficulty swallowing, or it hurts to swallow. You have wheezing or a persistent cough. Your symptoms do not improve with treatment. You have a hoarse voice. SEEK IMMEDIATE MEDICAL CARE IF: You have pain in your arms, neck, jaw, teeth, or back. You feel sweaty,  dizzy, or light-headed. You have chest pain or shortness of breath. You vomit and your vomit looks like blood or coffee grounds. You faint. Your stool is bloody or black. You cannot swallow, drink, or eat.   This information is not intended to replace advice given to you by your health care provider. Make sure you discuss any questions you have with your health care provider.   Document Released: 06/14/2005 Document Revised: 05/26/2015 Document Reviewed: 12/30/2014 Elsevier Interactive Patient Education [employer].

## 2015-06-28 NOTE — H&P (View-Only) (Signed)
Referring Provider: Sharilyn Sites, MD Primary Care Physician:  Purvis Kilts, MD Primary GI:  Dr. Gala Romney  Chief Complaint  Patient presents with  . Constipation    HPI:   59 year old male presents for follow-up on constipation. Last colonoscopy 03/24/2015 for abnormal sigmoid colon on CT with clinical bout of diverticulitis 6 months ago. Findings include single colon polyp measuring 5 mm in the sigmoid colon, pancolonic diverticulosis, abnormal sigmoid colon consistent with stigmata of recent diverticulitis. Recommend Benefiber 2 teaspoons twice daily, MiraLAX 1-2 times daily, follow-up with LFTs. Surgical pathology of the polyp showed inflammatory polyp. Per telephone notes in the system patient was having about of abdominal pain and constipation round July this month he was recommended to add Amitiza to his twice daily MiraLAX. No further communication until now. Last office visit 03/10/2015 notes abdominal ultrasound 1 month prior with normal CBD diameter and questionable fatty liver.  Today he states he had intolerable diarrhea with Linzess. States he never received the Amitiza recommended in the phone note. Currently taking Miralax bid and fiber chews. Is also taking Protonix '40mg'$  once daily. He noted minimal improvement in abdominal pain with starting Protonix. Abdominal pain at upper abdomen. Described as "like someone kicking me in the gut" and is intermittent. Lasts a variable amount "sometimes all day."  Nothing he can think of has made it better or worse. Not diet dependent. Has once bowel movement a day which is soft. Asked if feels like he fully empties his bowels after a bowel movement and said "I dont know." No improvement in pain after bowel movement, "sometimes it's worse." Sometimes pain is worse after eating. Recent CT abdomen/pelvis with "Extensive diverticulosis with associated wall thickening of the sigmoid colon. No evidence of active inflammatory change is identified.  Status post cholecystectomy." Denies chest pain, dyspnea, dizziness, lightheadedness, syncope, near syncope. Denies any other upper or lower GI symptoms.     Past Medical History  Diagnosis Date  . 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 S/P colonoscopy 2009    Pancolonic diverticula, hyperplastic rectal polyps  . Hyperplastic rectal polyp   . Diverticulitis   . COPD (chronic obstructive pulmonary disease) ([place])   . Essential hypertension     Past Surgical History  Procedure Laterality Date  . Cholecystectomy    . Liver biopsy    . Nasal septum surgery    . Arm surgery    . Inguinal hernia repair  Feb 2012    Dr. Arnoldo Morale   . Colonoscopy  2009    Pancolonic diverticula, hyperplastic rectal polyps  . Colonoscopy [name] 03/24/2015    RMR: Single colonic polyp removed as described above. Pancolonic diverticulosis. Abnormal sigmoid colon consistent with stigmata of recent diverticulitis.     Current Outpatient Prescriptions  Medication Sig Dispense Refill  . acetaminophen (TYLENOL) 500 MG tablet Take 500 mg by mouth every 6 (six) hours as needed for moderate pain or headache.    . Fiber, Guar Gum, CHEW Chew 2 tablets by mouth 2 (two) times daily.    . furosemide (LASIX) 20 MG tablet Take 20 mg by mouth daily.    Marland Kitchen ipratropium (ATROVENT HFA) 17 MCG/ACT inhaler Inhale 2 puffs into the lungs every 6 (six) hours as needed for wheezing. 1 Inhaler 12  . lisinopril (PRINIVIL,ZESTRIL) 10 MG tablet Take 10 mg by mouth daily.  2  . Multiple Vitamin (MULTIVITAMIN WITH MINERALS) TABS tablet Take 1 tablet by mouth daily.    Marland Kitchen  pantoprazole (PROTONIX) 40 MG tablet Take 1 tablet (40 mg total) by mouth daily. 90 tablet 3  . polyethylene glycol (MIRALAX / GLYCOLAX) packet Take 17 g by mouth daily.    Marland Kitchen PROAIR HFA 108 (90 BASE) MCG/ACT inhaler Inhale 2 puffs into the lungs every 6 (six) hours as needed for wheezing or shortness of breath.   10  . Tiotropium Bromide-Olodaterol  (STIOLTO RESPIMAT) 2.5-2.5 MCG/ACT AERS Inhale into the lungs.    . Linaclotide (LINZESS) 145 MCG CAPS capsule Take 145 mcg by mouth daily.     No current facility-administered medications for this visit.    Allergies as of 06/24/2015 - Review Complete 06/24/2015  Allergen Reaction Noted  . Hydrocodone Anaphylaxis and Other (See Comments)     Family History  Problem Relation Age of Onset  . Pneumonia Father   . Lung cancer Mother   . Lung cancer Father   . Pancreatitis Father     Social History   Social History  . Marital Status: Married    Spouse Name: [name]  . Number of Children: [name]  . Years of Education: [name]   Social History Main Topics  . Smoking status: Current Some Day Smoker -- 0.50 packs/day for 43 years    Types: Cigarettes  . Smokeless tobacco: None  . Alcohol Use: 0.0 oz/week    0 Standard drinks or equivalent per week     Comment: Maybe once a week  . Drug Use: No  . Sexual Activity:    Partners: Male   Other Topics Concern  . None   Social History Narrative    Review of Systems: General: Negative for anorexia, weight loss, fever, chills, fatigue, weakness. Eyes: Negative for vision changes.  ENT: Negative for hoarseness, difficulty swallowing. CV: Negative for chest pain, angina, palpitations, peripheral edema.  Respiratory: Negative for dyspnea at rest, cough, sputum, wheezing.  GI: See history of present illness. Endo: Negative for unusual weight change.    Physical Exam: BP 144/82 mmHg  Pulse 76  Temp(Src) 97.6 F (36.4 C) (Oral)  Ht '6\' 1"'$  (1.854 m)  Wt 184 lb 3.2 oz (83.553 kg)  BMI 24.31 kg/m2 General:   Alert and oriented. Pleasant and cooperative. Well-nourished and well-developed.  Head:  Normocephalic and atraumatic. Eyes:  Without icterus, sclera clear and conjunctiva pink.  Ears:  Normal auditory acuity. Cardiovascular:  S1, S2 present without murmurs appreciated. Normal pulses noted. Extremities without clubbing or  edema. Respiratory:  Clear to auscultation bilaterally. No wheezes, rales, or rhonchi. No distress.  Gastrointestinal:  +BS, soft, and non-distended. Mild to moderate epigastric TTP. No HSM noted. No guarding or rebound. No masses appreciated.  Rectal:  Deferred  Neurologic:  Alert and oriented x4;  grossly normal neurologically. Psych:  Alert and cooperative. Normal mood and affect.    06/24/2015 11:40 AM

## 2015-06-28 NOTE — Op Note (Signed)
[facility] [address] [place], [address]   ENDOSCOPY PROCEDURE REPORT  PATIENT: Jeffrey Gutierrez, Jeffrey Gutierrez  MR#: [id number] BIRTHDATE: 1956-07-13 , 44  yrs. old GENDER: male ENDOSCOPIST: R.  Garfield Cornea, MD FACP FACG REFERRED BY:  Sharilyn Sites, M.D. PROCEDURE DATE:  Jul 24, 2015 PROCEDURE:  EGD, diagnostic INDICATIONS:  epigastric pain. MEDICATIONS: IV Versed and Demerol incremental doses.  Zofran 4 mg IV.  Xylocaine gel orally. ASA CLASS:      Class III  CONSENT: The risks, benefits, limitations, alternatives and imponderables have been discussed.  The potential for biopsy, esophogeal dilation, etc. have also been reviewed.  Questions have been answered.  All parties agreeable.  Please see the history and physical in the medical record for more information.  DESCRIPTION OF PROCEDURE: After the risks benefits and alternatives of the procedure were thoroughly explained, informed consent was obtained.  The EG-[id number] ([id number]) endoscope was introduced through the mouth and advanced to the second portion of the duodenum , limited by Without limitations. The instrument was slowly withdrawn as the mucosa was fully examined. Estimated blood loss is zero unless otherwise noted in this procedure report.    Normal-appearing tubular esophagus aside from circumferential distal esophageal erosions within 5 mm the GE junction.  No Barrett's epithelium seen.  Stomach empty.  1 cm hiatal hernia. Normal-appearing gastric mucosa.  Patent pylorus.  Normal-appearing first and second portion of the duodenum.  Retroflexed views revealed no abnormalities and Retroflexed views revealed as previously described.     The scope was then withdrawn from the patient and the procedure completed.  COMPLICATIONS: There were no immediate complications.  ENDOSCOPIC IMPRESSION: Mild erosive reflux esophagitis. Small hiatal hernia.  RECOMMENDATIONS: Stop Protonix; begin trial of Dexilant 60 mg  daily?"go by my office for free samples. History of PBC. Not on Urso because of side effects. LFTs normal. Platelet count running a little low these days. We'll plan to go ahead and do ultrasound with elastography to further stage PBC.  REPEAT EXAM:  eSigned:  R. Garfield Cornea, MD Rosalita Chessman [facility] 24-Jul-2015 11:06 AM    CC:  CPT CODES: ICD CODES:  The ICD and CPT codes recommended by this software are interpretations from the data that the clinical staff has captured with the software.  The verification of the translation of this report to the ICD and CPT codes and modifiers is the sole responsibility of the health care institution and practicing physician where this report was generated.  [employer] will not be held responsible for the validity of the ICD and CPT codes included on this report.  AMA assumes no liability for data contained or not contained herein. CPT is a Designer, television/film set of the [employer].  PATIENT NAME:  Renwick, Asman MR#: [id number]

## 2015-06-28 NOTE — Progress Notes (Signed)
CC'D TO PCP °

## 2015-06-28 NOTE — Interval H&P Note (Signed)
History and Physical Interval Note:  06/28/2015 10:23 AM  Jeffrey Gutierrez  has presented today for surgery, with the diagnosis of abd pain  The various methods of treatment have been discussed with the patient and family. After consideration of risks, benefits and other options for treatment, the patient has consented to  Procedure(s) with comments: ESOPHAGOGASTRODUODENOSCOPY (EGD) ([name]) - 1130 - pt has a prior appointment, can't come earlier as a surgical intervention .  The patient's history has been reviewed, patient examined, no change in status, stable for surgery.  I have reviewed the patient's chart and labs.  Questions were answered to the patient's satisfaction.     Jeffrey Gutierrez  Constipation much better with Linzess; no effect on the epigastric pain. Diagnostic EGD per plan. Patient denies dysphagia.The risks, benefits, limitations, alternatives and imponderables have been reviewed with the patient. Potential for esophageal dilation, biopsy, etc. have also been reviewed.  Questions have been answered. All parties agreeable.

## 2015-06-29 ENCOUNTER — Ambulatory Visit (HOSPITAL_COMMUNITY)
Admission: RE | Admit: 2015-06-29 | Discharge: 2015-06-29 | Disposition: A | Discharge status: Home / Self Care | Payer: 59 | Source: Ambulatory Visit | Attending: Internal Medicine | Admitting: Internal Medicine

## 2015-06-29 ENCOUNTER — Ambulatory Visit (HOSPITAL_COMMUNITY)
Admission: RE | Admit: 2015-06-29 | Discharge: 2015-06-29 | Disposition: A | Discharge status: Home / Self Care | Payer: 59 | Source: Ambulatory Visit | Attending: Cardiology | Admitting: Cardiology

## 2015-06-29 DIAGNOSIS — K743 Primary biliary cirrhosis: Secondary | ICD-10-CM

## 2015-06-29 DIAGNOSIS — Z9049 Acquired absence of other specified parts of digestive tract: Secondary | ICD-10-CM | POA: Insufficient documentation

## 2015-06-29 DIAGNOSIS — I081 Rheumatic disorders of both mitral and tricuspid valves: Secondary | ICD-10-CM | POA: Insufficient documentation

## 2015-06-29 DIAGNOSIS — I371 Nonrheumatic pulmonary valve insufficiency: Secondary | ICD-10-CM | POA: Diagnosis not present

## 2015-06-29 DIAGNOSIS — R0602 Shortness of breath: Secondary | ICD-10-CM | POA: Diagnosis not present

## 2015-06-29 DIAGNOSIS — R1084 Generalized abdominal pain: Secondary | ICD-10-CM | POA: Diagnosis not present

## 2015-06-29 DIAGNOSIS — R161 Splenomegaly, not elsewhere classified: Secondary | ICD-10-CM | POA: Diagnosis not present

## 2015-06-29 DIAGNOSIS — G8929 Other chronic pain: Secondary | ICD-10-CM | POA: Insufficient documentation

## 2015-07-01 ENCOUNTER — Telehealth: Payer: Self-pay | Admitting: Internal Medicine

## 2015-07-01 NOTE — Progress Notes (Signed)
Pt has fu with eric in 3 months scheduled

## 2015-07-01 NOTE — Telephone Encounter (Signed)
Patient called to see if his results from his U/S that he had done Tuesday were back. I told him it normally takes 7-10 business days and the nurse would be calling him once they are available. He can be reached at [phone]

## 2015-07-01 NOTE — Telephone Encounter (Signed)
I spoke with the pt. See U/S results.

## 2015-07-05 ENCOUNTER — Encounter (HOSPITAL_COMMUNITY): Payer: Self-pay | Admitting: Internal Medicine

## 2015-07-07 ENCOUNTER — Other Ambulatory Visit (HOSPITAL_COMMUNITY): Payer: 59

## 2015-07-26 ENCOUNTER — Ambulatory Visit (INDEPENDENT_AMBULATORY_CARE_PROVIDER_SITE_OTHER): Payer: 59 | Admitting: Nurse Practitioner

## 2015-07-26 ENCOUNTER — Encounter: Payer: Self-pay | Admitting: Nurse Practitioner

## 2015-07-26 VITALS — BP 179/101 | HR 58 | Temp 97.3°F | Ht 73.0 in | Wt 182.0 lb

## 2015-07-26 DIAGNOSIS — K743 Primary biliary cirrhosis: Secondary | ICD-10-CM

## 2015-07-26 DIAGNOSIS — K5909 Other constipation: Secondary | ICD-10-CM | POA: Diagnosis not present

## 2015-07-26 DIAGNOSIS — K2100 Gastro-esophageal reflux disease with esophagitis, without bleeding: Secondary | ICD-10-CM

## 2015-07-26 DIAGNOSIS — K7460 Unspecified cirrhosis of liver: Secondary | ICD-10-CM | POA: Insufficient documentation

## 2015-07-26 NOTE — Progress Notes (Signed)
Referring Provider: Sharilyn Sites, MD Primary Care Physician:  Purvis Kilts, MD Primary GI:  Dr. Gala Romney  Chief Complaint  Patient presents with  . Follow-up    HPI:   59 year old male presents for follow-up on epigastric pain, constipation, and primary biliary cirrhosis. At last office visit he was having minimal improvement with Protonix. Endoscopy completed on 06/28/2015 which found mild erosive esophagitis. Recommended she switch to [place] and was told to come to our office to pick up samples. 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 He has been unable to take Urso due to adverse effects and his platelet count had been low as of late. Ultrasound elastography was found to be Metavir F3-F4 and recommendation was made to follow-up for cirrhosis care. As of note a new agent for PBC was approved approximately 4 months ago by the FDA and we can consider this as a possible treatment option for him as it is expected to have a lower side effect profile.  Today he states he started Dexilant but feels Protonix worked better for him symptomatically. He has also switched to decaf coffee in the morning. Has indigestion occasionally depending on his diet. Has on and off constipation on Miralax bid and fiber. Typically has a bowel movement 1-2 times a day. Denies hematochezia, melena, yellowing of skin and eyes, darkened urine. States he has not been screened for hepatitis C that he is aware of and no Hep C Ab is in our system. Denies fever, chills, N/V. Still having mild right-sided abdominal pain. Denies chest pain, dyspnea, dizziness, lightheadedness, syncope, near syncope. Denies any other upper or lower GI symptoms.  Past Medical History  Diagnosis Date  . PBC (primary biliary cirrhosis) ([place])     Diagnosed 2006  . S/P colonoscopy 2009    Pancolonic diverticula, hyperplastic rectal polyps  . Hyperplastic rectal polyp   . Diverticulitis   .  COPD (chronic obstructive pulmonary disease) ([place])   . Essential hypertension     Past Surgical History  Procedure Laterality Date  . Cholecystectomy    . Liver biopsy    . Nasal septum surgery    . Arm surgery    . Inguinal hernia repair  Feb 2012    Dr. Arnoldo Morale   . Colonoscopy  2009    Pancolonic diverticula, hyperplastic rectal polyps  . Colonoscopy [name] 03/24/2015    RMR: Single colonic polyp removed as described above. Pancolonic diverticulosis. Abnormal sigmoid colon consistent with stigmata of recent diverticulitis.   . Esophagogastroduodenoscopy [name] 06/28/2015    RMR: mild erosive reflux/small HH    Current Outpatient Prescriptions  Medication Sig Dispense Refill  . acetaminophen (TYLENOL) 500 MG tablet Take 500 mg by mouth every 6 (six) hours as needed for moderate pain or headache.    . furosemide (LASIX) 20 MG tablet Take 20 mg by mouth daily as needed for fluid.     Marland Kitchen ipratropium (ATROVENT HFA) 17 MCG/ACT inhaler Inhale 2 puffs into the lungs every 6 (six) hours as needed for wheezing. 1 Inhaler 12  . lisinopril (PRINIVIL,ZESTRIL) 20 MG tablet Take 20 mg by mouth daily.    . Multiple Vitamin (MULTIVITAMIN WITH MINERALS) TABS tablet Take 1 tablet by mouth daily.    . pantoprazole (PROTONIX) 40 MG tablet Take 1 tablet (40 mg total) by mouth daily. 90 tablet 3  . polyethylene glycol (MIRALAX / GLYCOLAX) packet Take 17 g by mouth 2 (two) times daily.     Marland Kitchen  PROAIR HFA 108 (90 BASE) MCG/ACT inhaler Inhale 2 puffs into the lungs every 6 (six) hours as needed for wheezing or shortness of breath.   10  . Tiotropium Bromide-Olodaterol (STIOLTO RESPIMAT) 2.5-2.5 MCG/ACT AERS Inhale 2 puffs into the lungs 2 (two) times daily.      No current facility-administered medications for this visit.    Allergies as of 07/26/2015 - Review Complete 07/26/2015  Allergen Reaction Noted  . Hydrocodone Anaphylaxis and Other (See Comments)   . Linzess [linaclotide] Diarrhea 07/26/2015    Family  History  Problem Relation Age of Onset  . Pneumonia Father   . Lung cancer Mother   . Lung cancer Father   . Pancreatitis Father     Social History   Social History  . Marital Status: Married    Spouse Name: [name]  . Number of Children: [name]  . Years of Education: [name]   Social History Main Topics  . Smoking status: Current Some Day Smoker -- 0.50 packs/day for 43 years    Types: Cigarettes  . Smokeless tobacco: None  . Alcohol Use: 0.0 oz/week    0 Standard drinks or equivalent per week     Comment: Maybe once a week  . Drug Use: No  . Sexual Activity:    Partners: Male   Other Topics Concern  . None   Social History Narrative    Review of Systems: General: Negative for anorexia, weight loss, fever, chills, fatigue, weakness. ENT: Negative for hoarseness, difficulty swallowing , nasal congestion. CV: Negative for chest pain, angina, palpitations, dyspnea on exertion, peripheral edema.  Respiratory: Negative for dyspnea at rest, dyspnea on exertion, cough, sputum, wheezing.  GI: See history of present illness. Endo: Negative for unusual weight change.  Heme: Negative for bruising or bleeding.   Physical Exam: BP 179/101 mmHg  Pulse 58  Temp(Src) 97.3 F (36.3 C)  Ht '6\' 1"'$  (1.854 m)  Wt 182 lb (82.555 kg)  BMI 24.02 kg/m2 General:   Alert and oriented. Pleasant and cooperative. Well-nourished and well-developed.  Head:  Normocephalic and atraumatic. Eyes:  Without icterus, sclera clear and conjunctiva pink.  Ears:  Normal auditory acuity. Cardiovascular:  S1, S2 present without murmurs appreciated. Extremities without clubbing or edema. Respiratory:  Clear to auscultation bilaterally. No wheezes, rales, or rhonchi. No distress.  Gastrointestinal:  +BS, soft, non-tender and non-distended. No HSM noted. No guarding or rebound. No masses appreciated.  Rectal:  Deferred  Skin:  Intact without significant lesions or rashes. Neurologic:  Alert and oriented x4;   grossly normal neurologically. Psych:  Alert and cooperative. Normal mood and affect. Heme/Lymph/Immune: No excessive bruising noted.    07/26/2015 8:22 AM

## 2015-07-26 NOTE — Assessment & Plan Note (Addendum)
Patient with a history of primary biliary cholangitis. Failed Urso in the past as he could not tolerate side effects. His liver labs have generally been within normal limits, however his platelet count has slowly trended down over time. There is recently been a new approved medication by the FDA for treatment of primary biliary cholangitis for patient to either cannot tolerate Urso or you continue to have LFT derangements on therapy. We discussed the possibility of starting this new medication for him. I will discuss this further with Dr. Gala Romney. This medication, called Alvino Blood, is felt to have a better side effect profile. Side effects to include pruritus, derangements in LFTs, changes in TSH, and decreased HDL. I will check routine labs for PBC/cirrhosis including CBC, CMP, AFP, PT/INR. Related to the possibility of starting this new medication I will also check TSH, and lipid panel. Patient is also requesting to be screened for hepatitis C as he has not been in the past. Our prior authorization nurse has begun to investigate the possibility of insurance coverage. While this is pending, it is discovered that the medication company has a robust patient assistance program and likely he could have the medication covered if we decide to move forward, even if insurance will not cover it. Return for follow-up in 2 months to make plans for next steps.

## 2015-07-26 NOTE — Assessment & Plan Note (Signed)
Patient with reflux esophagitis on endoscopy was previously taking Protonix. He was switched to Dexilant once a day. He feels that he was better controlled with Protonix as he still having nighttime symptoms. He is asking to take Dexilant in the evening. Advised him he can try this and see if that helps. If it does not help we'll move Dexilant back to the morning and can consider adding Zantac every evening or every other evening to help with his symptomatic issues. Return for follow-up in 2 months.

## 2015-07-26 NOTE — Progress Notes (Signed)
CC'D TO PCP °

## 2015-07-26 NOTE — Assessment & Plan Note (Signed)
Constipation relatively well controlled on daily fiber and MiraLAX twice a day. Continue this regimen. If symptoms change or worsen we can investigate further options such as Linzess or Amitiza. Return for follow-up in 2 months.

## 2015-07-26 NOTE — Patient Instructions (Signed)
1. Have your labs drawn when you're able. The need to be fasting for these labs. 2. Continue Dexilant, he may try taking in the evening to see if it helps. 3. Give this 2 weeks and if no improvement call back and we can add Zantac in the evening. 4. Return for follow-up in 2 months. 5. I will discuss with Dr. Gala Romney the possibility of starting the new medication for PBC. We will call the insurance company and inquire about coverage for this.

## 2015-07-31 LAB — COMPREHENSIVE METABOLIC PANEL
ALK PHOS: 83 U/L (ref 40–115)
ALT: 23 U/L (ref 9–46)
AST: 25 U/L (ref 10–35)
Albumin: 3.6 g/dL (ref 3.6–5.1)
BUN: 9 mg/dL (ref 7–25)
CALCIUM: 9 mg/dL (ref 8.6–10.3)
CO2: 32 mmol/L — ABNORMAL HIGH (ref 20–31)
Chloride: 97 mmol/L — ABNORMAL LOW (ref 98–110)
Creat: 0.71 mg/dL (ref 0.70–1.33)
Glucose, Bld: 97 mg/dL (ref 65–99)
POTASSIUM: 4.1 mmol/L (ref 3.5–5.3)
Sodium: 134 mmol/L — ABNORMAL LOW (ref 135–146)
TOTAL PROTEIN: 7.4 g/dL (ref 6.1–8.1)
Total Bilirubin: 0.6 mg/dL (ref 0.2–1.2)

## 2015-07-31 LAB — CBC WITH DIFFERENTIAL/PLATELET
BASOS ABS: 0 10*3/uL (ref 0.0–0.1)
Basophils Relative: 0 % (ref 0–1)
EOS PCT: 2 % (ref 0–5)
Eosinophils Absolute: 0.1 10*3/uL (ref 0.0–0.7)
HEMATOCRIT: 45.4 % (ref 39.0–52.0)
Hemoglobin: 15.4 g/dL (ref 13.0–17.0)
LYMPHS ABS: 1.3 10*3/uL (ref 0.7–4.0)
LYMPHS PCT: 46 % (ref 12–46)
MCH: 29.4 pg (ref 26.0–34.0)
MCHC: 33.9 g/dL (ref 30.0–36.0)
MCV: 86.6 fL (ref 78.0–100.0)
MONOS PCT: 11 % (ref 3–12)
MPV: 9.2 fL (ref 8.6–12.4)
Monocytes Absolute: 0.3 10*3/uL (ref 0.1–1.0)
NEUTROS ABS: 1.2 10*3/uL — AB (ref 1.7–7.7)
NEUTROS PCT: 41 % — AB (ref 43–77)
Platelets: 160 10*3/uL (ref 150–400)
RBC: 5.24 MIL/uL (ref 4.22–5.81)
RDW: 14.3 % (ref 11.5–15.5)
WBC: 2.9 10*3/uL — ABNORMAL LOW (ref 4.0–10.5)

## 2015-07-31 LAB — LIPID PANEL
Cholesterol: 146 mg/dL (ref 125–200)
HDL: 35 mg/dL — ABNORMAL LOW (ref >=40)
LDL Cholesterol: 95 mg/dL (ref <130)
Total CHOL/HDL Ratio: 4.2 Ratio (ref <=5.0)
Triglycerides: 81 mg/dL (ref <150)
VLDL: 16 mg/dL (ref <30)

## 2015-07-31 LAB — HEPATITIS C ANTIBODY: HCV AB: NEGATIVE

## 2015-07-31 LAB — TSH: TSH: 1.128 u[IU]/mL (ref 0.350–4.500)

## 2015-08-02 ENCOUNTER — Telehealth: Payer: Self-pay | Admitting: Internal Medicine

## 2015-08-02 LAB — PROTIME-INR

## 2015-08-02 LAB — AFP TUMOR MARKER: AFP TUMOR MARKER: 4.2 ng/mL (ref <6.1)

## 2015-08-02 NOTE — Telephone Encounter (Signed)
noted 

## 2015-08-02 NOTE — Telephone Encounter (Signed)
Gerald Stabs from [facility] called to say that patient had labs done Saturday and they need to recollect on his PT/INR and we should get the results from the other, but the he'll need to go back to the lab for a recollect on the other. Gerald Stabs verified patient's phone numbers  So he could call patient

## 2015-08-03 ENCOUNTER — Emergency Department (HOSPITAL_COMMUNITY): Payer: 59

## 2015-08-03 ENCOUNTER — Emergency Department (HOSPITAL_COMMUNITY)
Admission: EM | Admit: 2015-08-03 | Discharge: 2015-08-03 | Disposition: A | Discharge status: Home / Self Care | Payer: 59 | Attending: Physician Assistant | Admitting: Physician Assistant

## 2015-08-03 ENCOUNTER — Encounter (HOSPITAL_COMMUNITY): Payer: Self-pay

## 2015-08-03 DIAGNOSIS — I10 Essential (primary) hypertension: Secondary | ICD-10-CM | POA: Diagnosis not present

## 2015-08-03 DIAGNOSIS — Z9889 Other specified postprocedural states: Secondary | ICD-10-CM | POA: Insufficient documentation

## 2015-08-03 DIAGNOSIS — K219 Gastro-esophageal reflux disease without esophagitis: Secondary | ICD-10-CM | POA: Insufficient documentation

## 2015-08-03 DIAGNOSIS — Z79899 Other long term (current) drug therapy: Secondary | ICD-10-CM | POA: Diagnosis not present

## 2015-08-03 DIAGNOSIS — Z9049 Acquired absence of other specified parts of digestive tract: Secondary | ICD-10-CM | POA: Diagnosis not present

## 2015-08-03 DIAGNOSIS — F17210 Nicotine dependence, cigarettes, uncomplicated: Secondary | ICD-10-CM | POA: Diagnosis not present

## 2015-08-03 DIAGNOSIS — J449 Chronic obstructive pulmonary disease, unspecified: Secondary | ICD-10-CM | POA: Insufficient documentation

## 2015-08-03 DIAGNOSIS — R109 Unspecified abdominal pain: Secondary | ICD-10-CM | POA: Diagnosis present

## 2015-08-03 DIAGNOSIS — K4090 Unilateral inguinal hernia, without obstruction or gangrene, not specified as recurrent: Secondary | ICD-10-CM | POA: Diagnosis not present

## 2015-08-03 HISTORY — DX: Diaphragmatic hernia without obstruction or gangrene: K44.9

## 2015-08-03 HISTORY — DX: Gastro-esophageal reflux disease without esophagitis: K21.9

## 2015-08-03 LAB — COMPREHENSIVE METABOLIC PANEL
ALK PHOS: 73 U/L (ref 38–126)
ALT: 19 U/L (ref 17–63)
AST: 27 U/L (ref 15–41)
Albumin: 3.4 g/dL — ABNORMAL LOW (ref 3.5–5.0)
Anion gap: 5 (ref 5–15)
BUN: 10 mg/dL (ref 6–20)
CALCIUM: 9 mg/dL (ref 8.9–10.3)
CHLORIDE: 101 mmol/L (ref 101–111)
CO2: 31 mmol/L (ref 22–32)
CREATININE: 0.77 mg/dL (ref 0.61–1.24)
Glucose, Bld: 116 mg/dL — ABNORMAL HIGH (ref 65–99)
Potassium: 4 mmol/L (ref 3.5–5.1)
SODIUM: 137 mmol/L (ref 135–145)
Total Bilirubin: 0.5 mg/dL (ref 0.3–1.2)
Total Protein: 7.6 g/dL (ref 6.5–8.1)

## 2015-08-03 LAB — URINALYSIS, ROUTINE W REFLEX MICROSCOPIC
Bilirubin Urine: NEGATIVE
Glucose, UA: NEGATIVE mg/dL
HGB URINE DIPSTICK: NEGATIVE
Ketones, ur: NEGATIVE mg/dL
Leukocytes, UA: NEGATIVE
NITRITE: NEGATIVE
PROTEIN: NEGATIVE mg/dL
Specific Gravity, Urine: 1.013 (ref 1.005–1.030)
pH: 7 (ref 5.0–8.0)

## 2015-08-03 LAB — CBC WITH DIFFERENTIAL/PLATELET
BASOS ABS: 0 10*3/uL (ref 0.0–0.1)
Basophils Relative: 1 %
EOS ABS: 0.1 10*3/uL (ref 0.0–0.7)
EOS PCT: 2 %
HCT: 42.6 % (ref 39.0–52.0)
Hemoglobin: 14 g/dL (ref 13.0–17.0)
LYMPHS PCT: 44 %
Lymphs Abs: 1.3 10*3/uL (ref 0.7–4.0)
MCH: 28.9 pg (ref 26.0–34.0)
MCHC: 32.9 g/dL (ref 30.0–36.0)
MCV: 88 fL (ref 78.0–100.0)
Monocytes Absolute: 0.3 10*3/uL (ref 0.1–1.0)
Monocytes Relative: 12 %
NEUTROS PCT: 41 %
Neutro Abs: 1.2 10*3/uL — ABNORMAL LOW (ref 1.7–7.7)
PLATELETS: 159 10*3/uL (ref 150–400)
RBC: 4.84 MIL/uL (ref 4.22–5.81)
RDW: 14.1 % (ref 11.5–15.5)
WBC: 2.8 10*3/uL — AB (ref 4.0–10.5)

## 2015-08-03 LAB — LIPASE, BLOOD: LIPASE: 43 U/L (ref 11–51)

## 2015-08-03 MED ORDER — IOHEXOL 300 MG/ML  SOLN
25.0000 mL | Freq: Once | INTRAMUSCULAR | Status: AC | PRN
  Administered 2015-08-03: 25 mL via ORAL

## 2015-08-03 MED ORDER — SODIUM CHLORIDE 0.9 % IV BOLUS (SEPSIS)
1000.0000 mL | Freq: Once | INTRAVENOUS | Status: AC
  Administered 2015-08-03: 1000 mL via INTRAVENOUS

## 2015-08-03 MED ORDER — IOHEXOL 300 MG/ML  SOLN
100.0000 mL | Freq: Once | INTRAMUSCULAR | Status: AC | PRN
  Administered 2015-08-03: 100 mL via INTRAVENOUS

## 2015-08-03 NOTE — Consult Note (Signed)
Reason for Consult: Groin/abdominal and back pain, question appendicitis on CT  Referring Physician: Dr. Thomasene Lot EDP  Jeffrey Gutierrez is an 59 y.o. male.   HPI: I was asked to see this patient who presented to the emergency department with abdominal groin and back pain. The patient has a history of recurrent or chronic abdominal pain that is followed by Dr. Berton Bon at [facility], status post fairly recent colonoscopy and endoscopy and has a diagnosis of chronic diverticulitis. He states that about a week ago he began to have pain which he actually localizes in his right groin rather than his right lower quadrant. He also has some pain in his right flank and back. This has been fairly constant. It got worse while he is at work today and he presented to the emergency department. It is somewhat worse with motion. He has no nausea or vomiting. No fever or chills. No change in his bowel movements. He has a history of right inguinal hernia repair by Dr. Arnoldo Morale in 2012.  Past Medical History  Diagnosis Date  . PBC (primary biliary cirrhosis) ([place])     Diagnosed 2006  . S/P colonoscopy 2009    Pancolonic diverticula, hyperplastic rectal polyps  . Hyperplastic rectal polyp   . Diverticulitis   . COPD (chronic obstructive pulmonary disease) ([place])   . Essential hypertension   . GERD (gastroesophageal reflux disease)   . Hiatal hernia     Past Surgical History  Procedure Laterality Date  . Cholecystectomy    . Liver biopsy    . Nasal septum surgery    . Arm surgery    . Inguinal hernia repair  Feb 2012    Dr. Arnoldo Morale   . Colonoscopy  2009    Pancolonic diverticula, hyperplastic rectal polyps  . Colonoscopy [name] 03/24/2015    RMR: Single colonic polyp removed as described above. Pancolonic diverticulosis. Abnormal sigmoid colon consistent with stigmata of recent diverticulitis.   . Esophagogastroduodenoscopy [name] 06/28/2015    RMR: mild erosive reflux/small HH    Family History  Problem Relation Age of  Onset  . Pneumonia Father   . Lung cancer Mother   . Lung cancer Father   . Pancreatitis Father     Social History:  reports that he has been smoking Cigarettes.  He has a 21.5 pack-year smoking history. He has never used smokeless tobacco. He reports that he drinks alcohol. He reports that he does not use illicit drugs.  Allergies:  Allergies  Allergen Reactions  . Hydrocodone Anaphylaxis and Other (See Comments)    Throat closes up   . Linzess [Linaclotide] Diarrhea    No current facility-administered medications for this encounter.   Current Outpatient Prescriptions  Medication Sig Dispense Refill  . acetaminophen (TYLENOL) 500 MG tablet Take 500 mg by mouth every 6 (six) hours as needed for moderate pain or headache.    . dexlansoprazole (DEXILANT) 60 MG capsule Take 60 mg by mouth daily.     Marland Kitchen lisinopril-hydrochlorothiazide (PRINZIDE,ZESTORETIC) 20-12.5 MG tablet Take 1 tablet by mouth daily.    . Multiple Vitamin (MULTIVITAMIN WITH MINERALS) TABS tablet Take 1 tablet by mouth daily.    . polyethylene glycol (MIRALAX / GLYCOLAX) packet Take 17 g by mouth 2 (two) times daily.     Marland Kitchen PROAIR RESPICLICK 338 (90 BASE) MCG/ACT AEPB Place 2 puffs into both nostrils every 6 (six) hours as needed (wheezing, shortness of breath).   2  . ipratropium (ATROVENT HFA) 17 MCG/ACT inhaler Inhale 2  puffs into the lungs every 6 (six) hours as needed for wheezing. (Patient not taking: Reported on 08/03/2015) 1 Inhaler 12  . pantoprazole (PROTONIX) 40 MG tablet Take 1 tablet (40 mg total) by mouth daily. (Patient not taking: Reported on 08/03/2015) 90 tablet 3     Results for orders placed or performed during the hospital encounter of 08/03/15 (from the past 48 hour(s))  Urinalysis, Routine w reflex microscopic-may I&O cath if menses (not at [facility])     Status: None   Collection Time: 08/03/15  2:27 PM  Result Value Ref Range   Color, Urine YELLOW YELLOW   APPearance CLEAR CLEAR   Specific  Gravity, Urine 1.013 1.005 - 1.030   pH 7.0 5.0 - 8.0   Glucose, UA NEGATIVE NEGATIVE mg/dL   Hgb urine dipstick NEGATIVE NEGATIVE   Bilirubin Urine NEGATIVE NEGATIVE   Ketones, ur NEGATIVE NEGATIVE mg/dL   Protein, ur NEGATIVE NEGATIVE mg/dL   Nitrite NEGATIVE NEGATIVE   Leukocytes, UA NEGATIVE NEGATIVE    Comment: MICROSCOPIC NOT DONE ON URINES WITH NEGATIVE PROTEIN, BLOOD, LEUKOCYTES, NITRITE, OR GLUCOSE <1000 mg/dL.  CBC with Differential/Platelet     Status: Abnormal   Collection Time: 08/03/15  4:07 PM  Result Value Ref Range   WBC 2.8 (L) 4.0 - 10.5 K/uL   RBC 4.84 4.22 - 5.81 MIL/uL   Hemoglobin 14.0 13.0 - 17.0 g/dL   HCT 42.6 39.0 - 52.0 %   MCV 88.0 78.0 - 100.0 fL   MCH 28.9 26.0 - 34.0 pg   MCHC 32.9 30.0 - 36.0 g/dL   RDW 14.1 11.5 - 15.5 %   Platelets 159 150 - 400 K/uL   Neutrophils Relative % 41 %   Neutro Abs 1.2 (L) 1.7 - 7.7 K/uL   Lymphocytes Relative 44 %   Lymphs Abs 1.3 0.7 - 4.0 K/uL   Monocytes Relative 12 %   Monocytes Absolute 0.3 0.1 - 1.0 K/uL   Eosinophils Relative 2 %   Eosinophils Absolute 0.1 0.0 - 0.7 K/uL   Basophils Relative 1 %   Basophils Absolute 0.0 0.0 - 0.1 K/uL  Comprehensive metabolic panel     Status: Abnormal   Collection Time: 08/03/15  4:07 PM  Result Value Ref Range   Sodium 137 135 - 145 mmol/L   Potassium 4.0 3.5 - 5.1 mmol/L   Chloride 101 101 - 111 mmol/L   CO2 31 22 - 32 mmol/L   Glucose, Bld 116 (H) 65 - 99 mg/dL   BUN 10 6 - 20 mg/dL   Creatinine, Ser 0.77 0.61 - 1.24 mg/dL   Calcium 9.0 8.9 - 10.3 mg/dL   Total Protein 7.6 6.5 - 8.1 g/dL   Albumin 3.4 (L) 3.5 - 5.0 g/dL   AST 27 15 - 41 U/L   ALT 19 17 - 63 U/L   Alkaline Phosphatase 73 38 - 126 U/L   Total Bilirubin 0.5 0.3 - 1.2 mg/dL   GFR calc non Af Amer >60 >60 mL/min   GFR calc Af Amer >60 >60 mL/min    Comment: (NOTE) The eGFR has been calculated using the CKD EPI equation. This calculation has not been validated in all clinical situations. eGFR's  persistently <60 mL/min signify possible Chronic Kidney Disease.    Anion gap 5 5 - 15  Lipase, blood     Status: None   Collection Time: 08/03/15  4:07 PM  Result Value Ref Range   Lipase 43 11 - 51 U/L  Ct Abdomen Pelvis W Contrast  08/03/2015  CLINICAL DATA:  Hypertension, primary biliary cirrhosis, possible ileus, right flank pain EXAM: CT ABDOMEN AND PELVIS WITH CONTRAST TECHNIQUE: Multidetector CT imaging of the abdomen and pelvis was performed using the standard protocol following bolus administration of intravenous contrast. CONTRAST:  136m OMNIPAQUE IOHEXOL 300 MG/ML  SOLN COMPARISON:  04/05/2015 FINDINGS: Sagittal images of the spine are unremarkable. The lung bases are unremarkable. Enhanced liver shows no focal mass. Status postcholecystectomy. The pancreas, spleen and adrenal glands are unremarkable. No aortic aneurysm. Atherosclerotic calcifications of abdominal aorta and iliac arteries. Kidneys are symmetrical in size and enhancement. No hydronephrosis or hydroureter. Small hiatal hernia. Delayed renal images shows bilateral renal symmetrical excretion. There is no small bowel obstruction. No thickened or dilated small bowel loops. No ascites or free air. No adenopathy. The terminal ileum is unremarkable. Normal appendix is partially visualized in axial image 52. In axial image 50 there is mild thickening and minimal enhancement of appendiceal wall. The appendix measures 9 mm in diameter. Subtle mild stranding of periappendiceal fat. This is best seen in sagittal image 24 findings are suspicious for early appendicitis. Clinical correlation is necessary. Again noted a left inguinal hernia containing fat and partially colon without evidence of acute complication. Colonic diverticula are noted in sigmoid colon. No evidence of acute diverticulitis. No distal colonic obstruction. Prostate gland and seminal vesicles are unremarkable. Urinary bladder is unremarkable. Small nonspecific  bilateral inguinal lymph nodes. Tiny right inguinal hernia is stable. IMPRESSION: 1. There is subtle mild thickening and enhancement of the appendix. The appendix measures 9 mm in diameter. Clinical correlation is necessary to exclude early appendicitis. 2. No hydronephrosis or hydroureter. 3. Stable left inguinal hernia containing fat and partial colon without evidence of acute complication or colonic obstruction. 4. Sigmoid colon diverticula are noted. No evidence of acute diverticulitis. 5. No evidence of small bowel obstruction. 6. Status postcholecystectomy. These results were called by telephone at the time of interpretation on 08/03/2015 at 5:43 pm to Dr. CZenovia Jarred, who verbally acknowledged these results. Electronically Signed   By: LLahoma CrockerM.D.   On: 08/03/2015 17:43    Review of Systems  Constitutional: Negative for fever, chills and malaise/fatigue.  Respiratory: Negative.   Cardiovascular: Negative.   Gastrointestinal: Negative for nausea and vomiting.   Blood pressure 166/93, pulse 41, temperature 98.4 F (36.9 C), temperature source Oral, resp. rate 15, SpO2 94 %. Physical Exam General: Pleasant well-developed Caucasian male in no distress Skin: No rash or infection HEENT: Sclerae nonicteric. No palpable masses. Lungs: Clear equal breath sounds without increased work of breathing Cardiac: Regular rate and rhythm without murmurs Abdomen: Generally soft and nontender. Nondistended. Normal bowel sounds. Not tender in the right lower quadrant. Healed right groin incision. He indicates his pain at the lateral end of this incision. The patient standing and coughing there is a definite tender reducible recurrent hernia laterally that feels to be coming through a tight defect. He indicates that this is the location of his pain.  Assessment/Plan: Right groin/right lower quadrant pain. Physical exam and his indication of location of pain is entirely consistent with a recurrent  right inguinal hernia. I have reviewed his CT scan which shows very minimal Questionable findings and I do not see any clinical evidence of acute appendicitis. This is discussed with the patient and the emergency physicians. I think it is fine to discharge the patient home with symptomatic management and he can follow up with Dr. JArnoldo Morale  He understands that if he develops more generalized abdominal pain, fever, vomiting or other worsening symptoms he should present immediately to the emergency department but I think the risk that he has appendicitis is extremely low.  Abella Shugart T 08/03/2015, 6:40 PM

## 2015-08-03 NOTE — ED Notes (Signed)
Pt given green mouth swabs for this dry mouth

## 2015-08-03 NOTE — ED Notes (Signed)
MD at bedside. 

## 2015-08-03 NOTE — ED Provider Notes (Signed)
CSN: [id number]     Arrival date & time 08/03/15  1320 History   First MD Initiated Contact with Patient 08/03/15 1524     Chief Complaint  Patient presents with  . Flank Pain     (Consider location/radiation/quality/duration/timing/severity/associated sxs/prior Treatment) HPI   Patient is a 59 year old male with history of primary biliary cirrhosis, hypertension, hyperlipidemia, COPD presenting today with several complaints. Patient was seen at an urgent care today for right flank pain and a less than 1 cm knot in his right groin. Patient had x-ray done and showed a "ileus". Patient here because he was sent here for further workup of this "ileus". Patient had loose stools, diarrhea earlier today. He's had no vomiting. Patient has no abdominal pain.  Patient had no no blood in his urine, trouble urinating, dysuria. No fevers. No night sweats.  Past Medical History  Diagnosis Date  . PBC (primary biliary cirrhosis) ([place])     Diagnosed 2006  . S/P colonoscopy 2009    Pancolonic diverticula, hyperplastic rectal polyps  . Hyperplastic rectal polyp   . Diverticulitis   . COPD (chronic obstructive pulmonary disease) ([place])   . Essential hypertension   . GERD (gastroesophageal reflux disease)   . Hiatal hernia    Past Surgical History  Procedure Laterality Date  . Cholecystectomy    . Liver biopsy    . Nasal septum surgery    . Arm surgery    . Inguinal hernia repair  Feb 2012    Dr. Arnoldo Morale   . Colonoscopy  2009    Pancolonic diverticula, hyperplastic rectal polyps  . Colonoscopy [name] 03/24/2015    RMR: Single colonic polyp removed as described above. Pancolonic diverticulosis. Abnormal sigmoid colon consistent with stigmata of recent diverticulitis.   . Esophagogastroduodenoscopy [name] 06/28/2015    RMR: mild erosive reflux/small HH   Family History  Problem Relation Age of Onset  . Pneumonia Father   . Lung cancer Mother   . Lung cancer Father   . Pancreatitis Father     Social History  Substance Use Topics  . Smoking status: Current Some Day Smoker -- 0.50 packs/day for 43 years    Types: Cigarettes  . Smokeless tobacco: Never Used  . Alcohol Use: 0.0 oz/week    0 Standard drinks or equivalent per week     Comment: Maybe once a week    Review of Systems  Constitutional: Negative for fever and activity change.  Eyes: Negative for discharge.  Respiratory: Negative for cough and shortness of breath.   Cardiovascular: Negative for chest pain.  Gastrointestinal: Negative for abdominal pain.  Genitourinary: Positive for flank pain. Negative for dysuria, urgency and hematuria.  Musculoskeletal: Negative for arthralgias.  Skin: Negative for rash.  Allergic/Immunologic: Negative for immunocompromised state.  Neurological: Negative for seizures.  Psychiatric/Behavioral: Negative for behavioral problems and agitation.  All other systems reviewed and are negative.     Allergies  Hydrocodone and Linzess  Home Medications   Prior to Admission medications   Medication Sig Start Date End Date Taking? Authorizing Provider  acetaminophen (TYLENOL) 500 MG tablet Take 500 mg by mouth every 6 (six) hours as needed for moderate pain or headache.   Yes Historical Provider, MD  dexlansoprazole (DEXILANT) 60 MG capsule Take 60 mg by mouth daily.    Yes Historical Provider, MD  lisinopril-hydrochlorothiazide (PRINZIDE,ZESTORETIC) 20-12.5 MG tablet Take 1 tablet by mouth daily. 06/23/15  Yes Historical Provider, MD  Multiple Vitamin (MULTIVITAMIN WITH MINERALS) TABS tablet Take 1  tablet by mouth daily.   Yes Historical Provider, MD  polyethylene glycol (MIRALAX / GLYCOLAX) packet Take 17 g by mouth 2 (two) times daily.    Yes Historical Provider, MD  PROAIR RESPICLICK 371 (90 BASE) MCG/ACT AEPB Place 2 puffs into both nostrils every 6 (six) hours as needed (wheezing, shortness of breath).  07/26/15  Yes Historical Provider, MD  ipratropium (ATROVENT HFA) 17 MCG/ACT  inhaler Inhale 2 puffs into the lungs every 6 (six) hours as needed for wheezing. Patient not taking: Reported on 08/03/2015 05/03/15   Milton Ferguson, MD  pantoprazole (PROTONIX) 40 MG tablet Take 1 tablet (40 mg total) by mouth daily. Patient not taking: Reported on 08/03/2015 04/21/15   Orvil Feil, NP   BP 166/93 mmHg  Pulse 41  Temp(Src) 98.4 F (36.9 C) (Oral)  Resp 15  SpO2 94% Physical Exam  Constitutional: He is oriented to person, place, and time. He appears well-nourished.  HENT:  Head: Normocephalic.  Mouth/Throat: Oropharynx is clear and moist.  Eyes: Conjunctivae are normal.  Neck: No tracheal deviation present.  Cardiovascular: Normal rate.   Pulmonary/Chest: Effort normal. No stridor. No respiratory distress.  Abdominal: Soft. There is no tenderness. There is no guarding.  Mild flank pain right  1 cm nonmobile hard bump in right groin  Musculoskeletal: Normal range of motion. He exhibits no edema.  Neurological: He is oriented to person, place, and time. No cranial nerve deficit.  Skin: Skin is warm and dry. No rash noted. He is not diaphoretic.  Psychiatric: Thought content normal.  Nursing note and vitals reviewed.   ED Course  Procedures (including critical care time) Labs Review Labs Reviewed  CBC WITH DIFFERENTIAL/PLATELET - Abnormal; Notable for the following:    WBC 2.8 (*)    Neutro Abs 1.2 (*)    All other components within normal limits  COMPREHENSIVE METABOLIC PANEL - Abnormal; Notable for the following:    Glucose, Bld 116 (*)    Albumin 3.4 (*)    All other components within normal limits  URINE CULTURE  URINALYSIS, ROUTINE W REFLEX MICROSCOPIC (NOT AT [facility])  LIPASE, BLOOD    Imaging Review Ct Abdomen Pelvis W Contrast  08/03/2015  CLINICAL DATA:  Hypertension, primary biliary cirrhosis, possible ileus, right flank pain EXAM: CT ABDOMEN AND PELVIS WITH CONTRAST TECHNIQUE: Multidetector CT imaging of the abdomen and pelvis was performed using  the standard protocol following bolus administration of intravenous contrast. CONTRAST:  148m OMNIPAQUE IOHEXOL 300 MG/ML  SOLN COMPARISON:  04/05/2015 FINDINGS: Sagittal images of the spine are unremarkable. The lung bases are unremarkable. Enhanced liver shows no focal mass. Status postcholecystectomy. The pancreas, spleen and adrenal glands are unremarkable. No aortic aneurysm. Atherosclerotic calcifications of abdominal aorta and iliac arteries. Kidneys are symmetrical in size and enhancement. No hydronephrosis or hydroureter. Small hiatal hernia. Delayed renal images shows bilateral renal symmetrical excretion. There is no small bowel obstruction. No thickened or dilated small bowel loops. No ascites or free air. No adenopathy. The terminal ileum is unremarkable. Normal appendix is partially visualized in axial image 52. In axial image 50 there is mild thickening and minimal enhancement of appendiceal wall. The appendix measures 9 mm in diameter. Subtle mild stranding of periappendiceal fat. This is best seen in sagittal image 24 findings are suspicious for early appendicitis. Clinical correlation is necessary. Again noted a left inguinal hernia containing fat and partially colon without evidence of acute complication. Colonic diverticula are noted in sigmoid colon. No evidence of acute  diverticulitis. No distal colonic obstruction. Prostate gland and seminal vesicles are unremarkable. Urinary bladder is unremarkable. Small nonspecific bilateral inguinal lymph nodes. Tiny right inguinal hernia is stable. IMPRESSION: 1. There is subtle mild thickening and enhancement of the appendix. The appendix measures 9 mm in diameter. Clinical correlation is necessary to exclude early appendicitis. 2. No hydronephrosis or hydroureter. 3. Stable left inguinal hernia containing fat and partial colon without evidence of acute complication or colonic obstruction. 4. Sigmoid colon diverticula are noted. No evidence of acute  diverticulitis. 5. No evidence of small bowel obstruction. 6. Status postcholecystectomy. These results were called by telephone at the time of interpretation on 08/03/2015 at 5:43 pm to Dr. Zenovia Jarred , who verbally acknowledged these results. Electronically Signed   By: Lahoma Crocker M.D.   On: 08/03/2015 17:43   I have personally reviewed and evaluated these images and lab results as part of my medical decision-making.   EKG Interpretation None      MDM   Final diagnoses:  Unilateral inguinal hernia without obstruction or gangrene, recurrence not specified    Patient is a 59 year old with history of primary biliary cirrhosis followed by Dr. Buford Dresser. He is presenting today because of the "ileus" found by urgent care on a plane film. Patient had no symptoms such as vomiting, constipation. Patient's been stooling normally. Patient went to be seen at the urgent care because of right flank pain. According to patient and his UA was normal. We will get a UA here. We will get a CT abdomen pelvis given the findings on x-ray.  I don't suspect any abdominal pathology given his normal abdominal exam, normal vital signs. However given his 1 cm  nodule & ileus, we'll make sure there is no cancer or the bowel obstruction.  I reviewed images.   5:47 PM  Radiologist called me and reported that they think it's likely appendicitis. We'll talk with surgeon.  6:04 PM  Surgeon thinks it may not be an appendicitis. He wants to come and evaluate patient. Patient nothing by mouth.  7:06 PM Surgeon saw patient, Dr. Excell Seltzer. He did not think the patient has appendicitis. He recommends discharge home and follow-up with Dr. Arnoldo Morale in [place]. . Given the patient has no fever, white count think this is reasonable option. Patient will return to emergency department with any concerning symptoms.  Karlen Barbar Julio Alm, MD 08/03/15 1906

## 2015-08-03 NOTE — ED Notes (Signed)
Patient c/o right flank pain x 7 days that has been getting progressively worse. Patient denies any hematuria or dysuria. Patient went to [facility] and was told to come to the ED for possible ileus. Patient states he has been taking Miralax for 4 months and has intermittent bouts of diarrhea and constipation. The last BM was this AM and was diarrhea.

## 2015-08-03 NOTE — Discharge Instructions (Signed)
You were seen today for right flank pain. Urine was normal. Your CAT scan showed a little bit of inflammation near right inguinal canal. The surgeon saw you today did not think that this represented appendicitis. He wants to follow up with the surgeon near you, and [place]. Please return immediately if you have any vomiting, fevers, or concerning symptoms.    Hernia, Adult A hernia is the bulging of an organ or tissue through a weak spot in the muscles of the abdomen (abdominal wall). Hernias develop most often near the navel or groin. There are many kinds of hernias. Common kinds include:  Femoral hernia. This kind of hernia develops under the groin in the upper thigh area.  Inguinal hernia. This kind of hernia develops in the groin or scrotum.  Umbilical hernia. This kind of hernia develops near the navel.  Hiatal hernia. This kind of hernia causes part of the stomach to be pushed up into the chest.  Incisional hernia. This kind of hernia bulges through a scar from an abdominal surgery. CAUSES This condition may be caused by:  Heavy lifting.  Coughing over a long period of time.  Straining to have a bowel movement.  An incision made during an abdominal surgery.  A birth defect (congenital defect).  Excess weight or obesity.  Smoking.  Poor nutrition.  Cystic fibrosis.  Excess fluid in the abdomen.  Undescended testicles. SYMPTOMS Symptoms of a hernia include:  A lump on the abdomen. This is the first sign of a hernia. The lump may become more obvious with standing, straining, or coughing. It may get bigger over time if it is not treated or if the condition causing it is not treated.  Pain. A hernia is usually painless, but it may become painful over time if treatment is delayed. The pain is usually dull and may get worse with standing or lifting heavy objects. Sometimes a hernia gets tightly squeezed in the weak spot (strangulated) or stuck there (incarcerated) and  causes additional symptoms. These symptoms may include:  Vomiting.  Nausea.  Constipation.  Irritability. DIAGNOSIS A hernia may be diagnosed with:  A physical exam. During the exam your health care provider may ask you to cough or to make a specific movement, because a hernia is usually more visible when you move.  Imaging tests. These can include:  X-rays.  Ultrasound.  CT scan. TREATMENT A hernia that is small and painless may not need to be treated. A hernia that is large or painful may be treated with surgery. Inguinal hernias may be treated with surgery to prevent incarceration or strangulation. Strangulated hernias are always treated with surgery, because lack of blood to the trapped organ or tissue can cause it to die. Surgery to treat a hernia involves pushing the bulge back into place and repairing the weak part of the abdomen. HOME CARE INSTRUCTIONS  Avoid straining.  Do not lift anything heavier than 10 lb (4.5 kg).  Lift with your leg muscles, not your back muscles. This helps avoid strain.  When coughing, try to cough gently.  Prevent constipation. Constipation leads to straining with bowel movements, which can make a hernia worse or cause a hernia repair to break down. You can prevent constipation by:  Eating a high-fiber diet that includes plenty of fruits and vegetables.  Drinking enough fluids to keep your urine clear or pale yellow. Aim to drink 6-8 glasses of water per day.  Using a stool softener as directed by your health care provider.  Lose weight, if you are overweight.  Do not use any tobacco products, including cigarettes, chewing tobacco, or electronic cigarettes. If you need help quitting, ask your health care provider.  Keep all follow-up visits as directed by your health care provider. This is important. Your health care provider may need to monitor your condition. SEEK MEDICAL CARE IF:  You have swelling, redness, and pain in the  affected area.  Your bowel habits change. SEEK IMMEDIATE MEDICAL CARE IF:  You have a fever.  You have abdominal pain that is getting worse.  You feel nauseous or you vomit.  You cannot push the hernia back in place by gently pressing on it while you are lying down.  The hernia:  Changes in shape or size.  Is stuck outside the abdomen.  Becomes discolored.  Feels hard or tender.   This information is not intended to replace advice given to you by your health care provider. Make sure you discuss any questions you have with your health care provider.   Document Released: 09/04/2005 Document Revised: 09/25/2014 Document Reviewed: 07/15/2014 Elsevier Interactive Patient Education [employer].

## 2015-08-04 ENCOUNTER — Telehealth: Payer: Self-pay

## 2015-08-04 ENCOUNTER — Other Ambulatory Visit: Payer: Self-pay | Admitting: Nurse Practitioner

## 2015-08-04 LAB — PROTIME-INR
INR: 1 (ref <1.50)
PROTHROMBIN TIME: 13.3 s (ref 11.6–15.2)

## 2015-08-04 NOTE — Telephone Encounter (Signed)
T/C from [place] at [place] with STAT labs. PT  13.3 INR  1.00

## 2015-08-04 NOTE — Telephone Encounter (Signed)
Noted. Labs normal. Will review in the system as result note.

## 2015-08-05 LAB — URINE CULTURE: Culture: 80000

## 2015-08-06 ENCOUNTER — Telehealth (HOSPITAL_COMMUNITY): Payer: Self-pay

## 2015-08-06 NOTE — Telephone Encounter (Signed)
Post ED Visit - Positive Culture Follow-up: Successful Patient Follow-Up  Culture assessed and recommendations reviewed by: '[]'$  Elenor Quinones, Pharm.D. '[]'$  Heide Guile, Pharm.D., BCPS '[]'$  Parks Neptune, Pharm.D. '[]'$  Alycia Rossetti, Pharm.D., BCPS '[]'$  [place], [place].D., BCPS, AAHIVP '[]'$  Legrand Como, Pharm.D., BCPS, AAHIVP '[]'$  Milus Glazier, Pharm.D. '[]'$  [place], [place].D. Bonnee Quin, Pharm.D.  Positive urine culture, 80,000 colonies -> Group B Strep  '[x]'$  Patient discharged without antimicrobial prescription and treatment is now indicated '[]'$  Organism is resistant to prescribed ED discharge antimicrobial '[]'$  Patient with positive blood cultures  Changes discussed with ED provider: S. Dowless, PA-C New antibiotic prescription "Keflex 500 mg BID x 7 days" Called to [facility] [phone] and given to RPh.  Contacted patient, date 08/06/2015, time 10:18 Pt informed of Dx and need for addl tx.     Dortha Kern 08/06/2015, 10:15 AM

## 2015-08-06 NOTE — Progress Notes (Signed)
ED Antimicrobial Stewardship Positive Culture Follow Up   Jeffrey Gutierrez is an 59 y.o. male who presented to [facility] on 08/03/2015 with a chief complaint of  Chief Complaint  Patient presents with  . Flank Pain    Recent Results (from the past 720 hour(s))  Urine culture     Status: None   Collection Time: 08/03/15  2:27 PM  Result Value Ref Range Status   Specimen Description URINE, CLEAN CATCH  Final   Special Requests NONE  Final   Culture   Final    80,000 COLONIES/ml GROUP B STREP(S.AGALACTIAE)ISOLATED TESTING AGAINST S. AGALACTIAE NOT ROUTINELY PERFORMED DUE TO PREDICTABILITY OF AMP/PEN/VAN SUSCEPTIBILITY. Performed at [facility]    Report Status 08/05/2015 FINAL  Final   '[x]'$  Patient discharged originally without antimicrobial agent and treatment is now indicated  New antibiotic prescription: Keflex 500 mg, 1 tablet twice daily for 7 days.  ED Provider: Carlos Levering, PA-C   [name] 08/06/2015, 9:00 AM PGY-1 Pharmacy Resident Phone# [phone]

## 2015-09-17 ENCOUNTER — Emergency Department (HOSPITAL_COMMUNITY)
Admission: EM | Admit: 2015-09-17 | Discharge: 2015-09-17 | Disposition: A | Discharge status: Home / Self Care | Payer: 59 | Attending: Emergency Medicine | Admitting: Emergency Medicine

## 2015-09-17 ENCOUNTER — Encounter (HOSPITAL_COMMUNITY): Payer: Self-pay | Admitting: Emergency Medicine

## 2015-09-17 ENCOUNTER — Emergency Department (HOSPITAL_COMMUNITY): Payer: 59

## 2015-09-17 DIAGNOSIS — Z79899 Other long term (current) drug therapy: Secondary | ICD-10-CM | POA: Insufficient documentation

## 2015-09-17 DIAGNOSIS — Z7951 Long term (current) use of inhaled steroids: Secondary | ICD-10-CM | POA: Diagnosis not present

## 2015-09-17 DIAGNOSIS — I10 Essential (primary) hypertension: Secondary | ICD-10-CM | POA: Diagnosis not present

## 2015-09-17 DIAGNOSIS — J189 Pneumonia, unspecified organism: Secondary | ICD-10-CM

## 2015-09-17 DIAGNOSIS — K219 Gastro-esophageal reflux disease without esophagitis: Secondary | ICD-10-CM | POA: Diagnosis not present

## 2015-09-17 DIAGNOSIS — F17210 Nicotine dependence, cigarettes, uncomplicated: Secondary | ICD-10-CM | POA: Diagnosis not present

## 2015-09-17 DIAGNOSIS — J159 Unspecified bacterial pneumonia: Secondary | ICD-10-CM | POA: Diagnosis not present

## 2015-09-17 DIAGNOSIS — J441 Chronic obstructive pulmonary disease with (acute) exacerbation: Secondary | ICD-10-CM | POA: Diagnosis not present

## 2015-09-17 DIAGNOSIS — R05 Cough: Secondary | ICD-10-CM | POA: Diagnosis present

## 2015-09-17 MED ORDER — ALBUTEROL SULFATE HFA 108 (90 BASE) MCG/ACT IN AERS
2.0000 | INHALATION_SPRAY | Freq: Once | RESPIRATORY_TRACT | Status: AC
  Administered 2015-09-17: 2 via RESPIRATORY_TRACT
  Filled 2015-09-17: qty 6.7

## 2015-09-17 MED ORDER — PREDNISONE 20 MG PO TABS
40.0000 mg | ORAL_TABLET | Freq: Once | ORAL | Status: AC
  Administered 2015-09-17: 40 mg via ORAL
  Filled 2015-09-17: qty 2

## 2015-09-17 MED ORDER — ALBUTEROL SULFATE HFA 108 (90 BASE) MCG/ACT IN AERS: 2.0000 | INHALATION_SPRAY | RESPIRATORY_TRACT | Status: AC | PRN

## 2015-09-17 MED ORDER — PREDNISONE 20 MG PO TABS: 40.0000 mg | ORAL_TABLET | Freq: Every day | ORAL | Status: DC

## 2015-09-17 MED ORDER — DOXYCYCLINE HYCLATE 100 MG PO TABS: 100.0000 mg | ORAL_TABLET | Freq: Two times a day (BID) | ORAL | Status: DC

## 2015-09-17 MED ORDER — IPRATROPIUM-ALBUTEROL 0.5-2.5 (3) MG/3ML IN SOLN
3.0000 mL | RESPIRATORY_TRACT | Status: DC
  Administered 2015-09-17: 3 mL via RESPIRATORY_TRACT
  Filled 2015-09-17: qty 3

## 2015-09-17 MED ORDER — DOXYCYCLINE HYCLATE 100 MG PO TABS
100.0000 mg | ORAL_TABLET | Freq: Once | ORAL | Status: AC
  Administered 2015-09-17: 100 mg via ORAL
  Filled 2015-09-17: qty 1

## 2015-09-17 NOTE — ED Notes (Addendum)
o2 sats fluctuating from 88-90%. O2 at 2 L/min via [place] initiated

## 2015-09-17 NOTE — ED Provider Notes (Signed)
CSN: [id number]     Arrival date & time 09/17/15  1353 History   First MD Initiated Contact with Patient 09/17/15 1435     Chief Complaint  Patient presents with  . Cough  . Shortness of Breath     (Consider location/radiation/quality/duration/timing/severity/associated sxs/prior Treatment) Patient is a 59 y.o. male presenting with cough.  Cough Cough characteristics:  Productive Sputum characteristics:  Nondescript and yellow Severity:  Mild Duration:  2 days Progression:  Worsening Chronicity:  Recurrent Smoker: yes   Associated symptoms: chills, fever and shortness of breath   Associated symptoms: no wheezing     Past Medical History  Diagnosis Date  . PBC (primary biliary cirrhosis) ([place])     Diagnosed 2006  . S/P colonoscopy 2009    Pancolonic diverticula, hyperplastic rectal polyps  . Hyperplastic rectal polyp   . Diverticulitis   . COPD (chronic obstructive pulmonary disease) ([place])   . Essential hypertension   . GERD (gastroesophageal reflux disease)   . Hiatal hernia    Past Surgical History  Procedure Laterality Date  . Cholecystectomy    . Liver biopsy    . Nasal septum surgery    . Arm surgery    . Inguinal hernia repair  Feb 2012    Dr. Arnoldo Morale   . Colonoscopy  2009    Pancolonic diverticula, hyperplastic rectal polyps  . Colonoscopy [name] 03/24/2015    RMR: Single colonic polyp removed as described above. Pancolonic diverticulosis. Abnormal sigmoid colon consistent with stigmata of recent diverticulitis.   . Esophagogastroduodenoscopy [name] 06/28/2015    RMR: mild erosive reflux/small HH   Family History  Problem Relation Age of Onset  . Pneumonia Father   . Lung cancer Mother   . Lung cancer Father   . Pancreatitis Father    Social History  Substance Use Topics  . Smoking status: Current Some Day Smoker -- 0.50 packs/day for 43 years    Types: Cigarettes  . Smokeless tobacco: Never Used  . Alcohol Use: 0.0 oz/week    0 Standard drinks or  equivalent per week     Comment: Maybe once a week    Review of Systems  Constitutional: Positive for fever and chills.  Eyes: Negative for pain.  Respiratory: Positive for cough and shortness of breath. Negative for wheezing and stridor.   All other systems reviewed and are negative.     Allergies  Dihydrocodeine; Hydrocodone; and Linzess  Home Medications   Prior to Admission medications   Medication Sig Start Date End Date Taking? Authorizing Provider  acetaminophen (TYLENOL) 500 MG tablet Take 500 mg by mouth every 6 (six) hours as needed for moderate pain or headache.   Yes Historical Provider, MD  Celedonio Miyamoto 62.5-25 MCG/INH AEPB INHALE 1 PUFF DAILY 09/03/15  Yes Historical Provider, MD  dexlansoprazole (DEXILANT) 60 MG capsule Take 60 mg by mouth daily.    Yes Historical Provider, MD  HYDROmorphone (DILAUDID) 4 MG tablet Take 4 mg by mouth daily as needed for moderate pain or severe pain.   Yes Historical Provider, MD  ibuprofen (ADVIL,MOTRIN) 200 MG tablet Take 200 mg by mouth every 6 (six) hours as needed for mild pain or moderate pain.   Yes Historical Provider, MD  lisinopril-hydrochlorothiazide (PRINZIDE,ZESTORETIC) 20-12.5 MG tablet Take 1 tablet by mouth daily. 06/23/15  Yes Historical Provider, MD  mometasone-formoterol (DULERA) 100-5 MCG/ACT AERO Inhale 2 puffs into the lungs daily as needed for wheezing.   Yes Historical Provider, MD  Multiple Vitamin (MULTIVITAMIN  WITH MINERALS) TABS tablet Take 1 tablet by mouth daily.   Yes Historical Provider, MD  polyethylene glycol (MIRALAX / GLYCOLAX) packet Take 34 g by mouth daily.    Yes Historical Provider, MD  PROAIR RESPICLICK 295 (90 BASE) MCG/ACT AEPB Place 2 puffs into both nostrils every 6 (six) hours as needed (wheezing, shortness of breath).  07/26/15  Yes Historical Provider, MD  albuterol (PROVENTIL HFA;VENTOLIN HFA) 108 (90 Base) MCG/ACT inhaler Inhale 2 puffs into the lungs every 4 (four) hours as needed for  wheezing or shortness of breath. 09/17/15   Merrily Pew, MD  doxycycline (VIBRA-TABS) 100 MG tablet Take 1 tablet (100 mg total) by mouth 2 (two) times daily. 09/17/15   Merrily Pew, MD  predniSONE (DELTASONE) 20 MG tablet Take 2 tablets (40 mg total) by mouth daily with breakfast. 09/18/15   Merrily Pew, MD   BP 126/79 mmHg  Pulse 91  Temp(Src) 98.2 F (36.8 C) (Oral)  Resp 20  Ht '6\' 1"'$  (1.854 m)  Wt 180 lb (81.647 kg)  BMI 23.75 kg/m2  SpO2 91% Physical Exam  Constitutional: He is oriented to person, place, and time. He appears well-developed and well-nourished.  HENT:  Head: Normocephalic and atraumatic.  Neck: Normal range of motion.  Cardiovascular: Normal rate.   Pulmonary/Chest: Effort normal. No respiratory distress. He has rales (RLL).  Abdominal: Soft. He exhibits no distension. There is no rebound and no guarding.  Musculoskeletal: Normal range of motion. He exhibits no edema or tenderness.  Neurological: He is alert and oriented to person, place, and time. No cranial nerve deficit.  Skin: Skin is warm and dry.  Nursing note and vitals reviewed.   ED Course  Procedures (including critical care time) Labs Review Labs Reviewed - No data to display  Imaging Review Dg Chest 2 View  09/17/2015  CLINICAL DATA:  Initial encounter for 1 - 2 week history of productive cough and shortness of breath. EXAM: CHEST  2 VIEW COMPARISON:  05/03/2015 FINDINGS: Lungs are hyperexpanded. Stable appearance of vascular markings in the right infrahilar region since prior study and also comparing back to 12/13/2013. Patchy airspace disease in the medial left base seen posteriorly on the lateral film. No pulmonary edema or pleural effusion. The cardiopericardial silhouette is within normal limits for size. IMPRESSION: Patchy posterior left lower lobe airspace disease, suggesting pneumonia. Emphysema. Electronically Signed   By: Misty Stanley M.D.   On: 09/17/2015 14:39   I have personally  reviewed and evaluated these images and lab results as part of my medical decision-making.   EKG Interpretation None      MDM   Final diagnoses:  Community acquired pneumonia    59 yo M here w/ CAP. sats as low as 89 when ambulating but asymptomatic. Observed in ED for prolonged period after steroids, breathing treatment and abx and seems stable. Will return here for new/worsening symptoms. Dc on steroids, abx, albuterol and recommend pcp follow up with strict return precautions.     Merrily Pew, MD 09/18/15 [phone]

## 2015-09-17 NOTE — ED Notes (Signed)
Patient complaining of cough, fever, and shortness of breath starting yesterday. States "my oxygen level was 84 at home." Also complaining of abdominal pain. States sputum is green and brown colored.

## 2015-09-17 NOTE — ED Notes (Signed)
Pt ambulated in hallway sats 91-93% on room air. Dr Dolly Rias notified

## 2015-09-27 ENCOUNTER — Encounter: Payer: Self-pay | Admitting: Gastroenterology

## 2015-09-27 ENCOUNTER — Ambulatory Visit (INDEPENDENT_AMBULATORY_CARE_PROVIDER_SITE_OTHER): Payer: 59 | Admitting: Gastroenterology

## 2015-09-27 ENCOUNTER — Ambulatory Visit: Payer: 59 | Admitting: Nurse Practitioner

## 2015-09-27 VITALS — BP 136/81 | HR 78 | Temp 97.4°F | Ht 73.0 in | Wt 179.2 lb

## 2015-09-27 DIAGNOSIS — K2100 Gastro-esophageal reflux disease with esophagitis, without bleeding: Secondary | ICD-10-CM

## 2015-09-27 DIAGNOSIS — K5900 Constipation, unspecified: Secondary | ICD-10-CM

## 2015-09-27 DIAGNOSIS — K743 Primary biliary cirrhosis: Secondary | ICD-10-CM

## 2015-09-27 DIAGNOSIS — R1011 Right upper quadrant pain: Secondary | ICD-10-CM | POA: Diagnosis not present

## 2015-09-27 MED ORDER — OBETICHOLIC ACID 5 MG PO TABS
5.0000 mg | ORAL_TABLET | Freq: Every day | ORAL | Status: DC
Start: 2015-09-27 — End: 2015-11-10

## 2015-09-27 MED ORDER — ESOMEPRAZOLE MAGNESIUM 40 MG PO CPDR: 40.0000 mg | DELAYED_RELEASE_CAPSULE | Freq: Every day | ORAL | Status: DC

## 2015-09-27 MED ORDER — LUBIPROSTONE 24 MCG PO CAPS: 24.0000 ug | ORAL_CAPSULE | Freq: Two times a day (BID) | ORAL | Status: DC

## 2015-09-27 NOTE — Patient Instructions (Signed)
1. Stop Dexilant once you receive Nexium. We may have to do a prior authorization to get it approved by your insurance. 2. Start Amitiza 24 mcg twice daily with food for constipation. Samples and RX provided. 3. Start Alvino Blood once daily for liver disease. I sent in RX to your pharmacy. 4. Return to the office to see Dr. Gala Romney in 8 weeks. Call sooner if you continue to have abdominal pain.

## 2015-09-27 NOTE — Progress Notes (Signed)
Primary Care Physician: Purvis Kilts, MD  Primary Gastroenterologist:  Garfield Cornea, MD   Chief Complaint  Patient presents with  . Abdominal Pain    HPI: Jeffrey Gutierrez is a 60 y.o. male here for follow up of PBC, abdominal pain, constipation, ERE. Last seen in 07/2015. Endoscopy completed on 06/28/2015 which found mild erosive esophagitis. 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 He has been unable to take Urso due to adverse effects and his platelet count had been low as of late. Ultrasound elastography was found to be Metavir F3-F4 and recommendation was made to follow-up for cirrhosis care. As of note a new agent for PBC Alvino Blood) was approved approximately several months ago by the FDA. Walden Field, NP considering initiating as it is expected to have a lower side effect profile. Patient back today to discuss.  Overall Miralax BID helps constipation. Has a lot of gas/bloating however. Recently went four days without BM and developed RUQ pain, severe. Once BM, some relief but pain persistent. Not worse with meals. No typical heartburn. Dexilant at nighttime. Doesn't seem to help. Zantac '150mg'$  at lunch time, seems to help more than anything else. Has epigastric burning as symptom of ERE. Took nexium years ago with good relief. No melena, brbpr.   Left lower lobe PNA 09/17/15. Finished up prednisone and abx.    Current Outpatient Prescriptions  Medication Sig Dispense Refill  . acetaminophen (TYLENOL) 500 MG tablet Take 500 mg by mouth every 6 (six) hours as needed for moderate pain or headache.    . albuterol (PROVENTIL HFA;VENTOLIN HFA) 108 (90 Base) MCG/ACT inhaler Inhale 2 puffs into the lungs every 4 (four) hours as needed for wheezing or shortness of breath. 1 Inhaler 0  . ANORO ELLIPTA 62.5-25 MCG/INH AEPB INHALE 1 PUFF DAILY  5  . dexlansoprazole (DEXILANT) 60 MG capsule Take 60 mg by mouth daily.     Marland Kitchen ibuprofen  (ADVIL,MOTRIN) 200 MG tablet Take 200 mg by mouth every 6 (six) hours as needed for mild pain or moderate pain.    Marland Kitchen lisinopril-hydrochlorothiazide (PRINZIDE,ZESTORETIC) 20-12.5 MG tablet Take 1 tablet by mouth daily.    . mometasone-formoterol (DULERA) 100-5 MCG/ACT AERO Inhale 2 puffs into the lungs daily as needed for wheezing.    . Multiple Vitamin (MULTIVITAMIN WITH MINERALS) TABS tablet Take 1 tablet by mouth daily.    . polyethylene glycol (MIRALAX / GLYCOLAX) packet Take 17 g by mouth BID.     Marland Kitchen PROAIR RESPICLICK 798 (90 BASE) MCG/ACT AEPB Place 2 puffs into both nostrils every 6 (six) hours as needed (wheezing, shortness of breath).   2  .       .         No current facility-administered medications for this visit.    Allergies as of 09/27/2015 - Review Complete 09/27/2015  Allergen Reaction Noted  . Dihydrocodeine Anaphylaxis 09/17/2015  . Hydrocodone Anaphylaxis and Other (See Comments)   . Linzess [linaclotide] Diarrhea 07/26/2015    ROS:  General: Negative for anorexia, weight loss, fever, chills, fatigue, weakness. ENT: Negative for hoarseness, difficulty swallowing , nasal congestion. CV: Negative for chest pain, angina, palpitations, dyspnea on exertion, peripheral edema.  Respiratory: Negative for dyspnea at rest, dyspnea on exertion, cough, sputum, wheezing.  GI: See history of present illness. GU:  Negative for dysuria, hematuria, urinary incontinence, urinary frequency, nocturnal urination.  Endo: Negative for unusual weight change.    Physical Examination:  BP 136/81 mmHg  Pulse 78  Temp(Src) 97.4 F (36.3 C) (Oral)  Ht '6\' 1"'$  (1.854 m)  Wt 179 lb 3.2 oz (81.285 kg)  BMI 23.65 kg/m2  General: Well-nourished, well-developed in no acute distress.  Eyes: No icterus. Mouth: Oropharyngeal mucosa moist and pink , no lesions erythema or exudate. Lungs: Clear to auscultation bilaterally.  Heart: Regular rate and rhythm, no murmurs rubs or gallops.  Abdomen:  Bowel sounds are normal, mild RUQ tenderness, nondistended, no hepatosplenomegaly or masses, no abdominal bruits or hernia , no rebound or guarding.   Extremities: No lower extremity edema. No clubbing or deformities. Neuro: Alert and oriented x 4   Skin: Warm and dry, no jaundice.   Psych: Alert and cooperative, normal mood and affect.  Labs:    Lab Results  Component Value Date   INR 1.00 08/04/2015   INR CANCELED 07/31/2015   INR 1.03 11/03/2010   Lab Results  Component Value Date   CREATININE 0.77 08/03/2015   BUN 10 08/03/2015   NA 137 08/03/2015   K 4.0 08/03/2015   CL 101 08/03/2015   CO2 31 08/03/2015   Lab Results  Component Value Date   ALT 19 08/03/2015   AST 27 08/03/2015   ALKPHOS 73 08/03/2015   BILITOT 0.5 08/03/2015   Lab Results  Component Value Date   WBC 2.8* 08/03/2015   HGB 14.0 08/03/2015   HCT 42.6 08/03/2015   MCV 88.0 08/03/2015   PLT 159 08/03/2015    Imaging Studies: Dg Chest 2 View  09/17/2015  CLINICAL DATA:  Initial encounter for 1 - 2 week history of productive cough and shortness of breath. EXAM: CHEST  2 VIEW COMPARISON:  05/03/2015 FINDINGS: Lungs are hyperexpanded. Stable appearance of vascular markings in the right infrahilar region since prior study and also comparing back to 12/13/2013. Patchy airspace disease in the medial left base seen posteriorly on the lateral film. No pulmonary edema or pleural effusion. The cardiopericardial silhouette is within normal limits for size. IMPRESSION: Patchy posterior left lower lobe airspace disease, suggesting pneumonia. Emphysema. Electronically Signed   By: Misty Stanley M.D.   On: 09/17/2015 14:39

## 2015-09-27 NOTE — Assessment & Plan Note (Signed)
Stop Dexilant. Trial of Nexium 40 mg daily. May require prior authorization. Patient aware. If ongoing symptoms, he should call. Otherwise see back in 2 months.

## 2015-09-27 NOTE — Progress Notes (Signed)
cc'ed to pcp °

## 2015-09-27 NOTE — Progress Notes (Signed)
Please let patient know that he should take vitamin supplement, especially one including vitamin A, D, E, K. He is due for bone density study as well given his liver disease. He can have done now or discuss further with Dr. Gala Romney when he comes back for his office visit. Please let me know what he decides.

## 2015-09-27 NOTE — Assessment & Plan Note (Signed)
Primary biliary cholangitis. Failed Urso in the past, intolerant due to side effects. Has had mild thrombocytopenia although not persistent. Elastography with F3/F4. No evidence of portal hypertension on recent EGD. Air on the side of safety, pursue cirrhosis care.  Patient interested in trying Ocaliva, new medication for PBC. We have sent RX to see if covered. We'll have the patient come back to see Dr. Gala Romney in 2 months or he can call sooner if any problems.  At that time can update his labs, check hepatitis A and B immune status.  Patient's last bone density study was in 2012, normal. Consider updating after his next office visit.  Encouraged vitamin supplementation especially fat-soluble, ADEK.

## 2015-09-27 NOTE — Assessment & Plan Note (Signed)
Stopped MiraLAX. Begin Amitiza 24 g twice a day. Samples and prescription provided. If he continues to have abdominal pain he should let us know.

## 2015-10-04 NOTE — Progress Notes (Signed)
Tried to call pt- NA 

## 2015-10-07 ENCOUNTER — Telehealth: Payer: Self-pay | Admitting: Internal Medicine

## 2015-10-07 NOTE — Telephone Encounter (Signed)
Pt has OV with RMR on 1/24 at 0800. I noticed in epic he was suppose to have LFTs prior to OV and patient has not had labs done. He said he just left his PCP and they sent him here to make follow up appointment. Does he need to have this done before his OV?

## 2015-10-07 NOTE — Telephone Encounter (Signed)
Pt just saw LSL on 09/27/15. He is supposed to come back and see RMR in 8 weeks. Spoke with LSL- he should keep the appt with RMR if he is having problems or if something new is going on but otherwise he needs to come back in 8 weeks. He just had blood work done in November and does not need anymore at this time.

## 2015-10-12 ENCOUNTER — Ambulatory Visit (INDEPENDENT_AMBULATORY_CARE_PROVIDER_SITE_OTHER): Payer: 59 | Admitting: Internal Medicine

## 2015-10-12 ENCOUNTER — Encounter: Payer: Self-pay | Admitting: Internal Medicine

## 2015-10-12 VITALS — BP 174/98 | HR 87 | Temp 97.6°F | Ht 73.0 in | Wt 176.4 lb

## 2015-10-12 DIAGNOSIS — K4090 Unilateral inguinal hernia, without obstruction or gangrene, not specified as recurrent: Secondary | ICD-10-CM

## 2015-10-12 DIAGNOSIS — K219 Gastro-esophageal reflux disease without esophagitis: Secondary | ICD-10-CM

## 2015-10-12 DIAGNOSIS — K5900 Constipation, unspecified: Secondary | ICD-10-CM

## 2015-10-12 NOTE — Patient Instructions (Signed)
Cautious colonic purge today with moody prep. If patient's symptoms worsen he starts having nausea and vomiting he is to stop the prep and call me. Otherwise, he is to give me a progress report tomorrow morning  Ultimately, You may need to back to see Dr. Arnoldo Morale regarding this issue and consider repair of left inguinal hernia.  I called Dr. Arnoldo Morale and discussed case with him. He agrees with the plan.he may need to see Dr. Arnoldo Morale in the near future.  Will continue to move towards a Biochemist, clinical for [facility] for PBC  Please call me tomorrow morning and let me know how you're doing.

## 2015-10-12 NOTE — Progress Notes (Signed)
Primary Care Physician:  Purvis Kilts, MD Primary Gastroenterologist:  Dr. Gala Romney  Pre-Procedure History & Physical: HPI:  Jeffrey Gutierrez is a 60 y.o. male here for for evaluation of acute on chronic right-sided abdominal pain; has intermittent constipation which varies quite a bit from time to time - not reliably improved with Amitiza or MiraLax.  Patient denies rectal bleeding or melena; some  And sigmoid colon diverticulosis on CT last year small benign polyp. Essentially negative EGD and CT at this facility last year; Knuckle of sigmoid colon noted in the left inguinal hernia sac and increased stool burden on the right side. Status post right inguinal hernia repair last year by Dr. Arnoldo Morale. Nexium now controlling GERD very well -   has not had any nausea or vomiting; CT scan and extensive labs at the [facility] ED this past week including urinalysis, Chem-20 all look good empirically;  his LFTs okay. Lipase normal ;slightly low WBC at  4.4; hemoglobin 15.0. Lipase 45; has not started Ocaliva for PBC as of yet.  CT last week demonstrated a knuckle of ascending colon in the left inguinal hernia sac.  Past Medical History  Diagnosis Date  . PBC (primary biliary cirrhosis) ([place])     Diagnosed 2006  . S/P colonoscopy 2009    Pancolonic diverticula, hyperplastic rectal polyps  . Hyperplastic rectal polyp   . Diverticulitis   . COPD (chronic obstructive pulmonary disease) ([place])   . Essential hypertension   . GERD (gastroesophageal reflux disease)   . Hiatal hernia     Past Surgical History  Procedure Laterality Date  . Cholecystectomy    . Liver biopsy    . Nasal septum surgery    . Arm surgery    . Inguinal hernia repair  Feb 2012    Dr. Arnoldo Morale   . Colonoscopy  2009    Pancolonic diverticula, hyperplastic rectal polyps  . Colonoscopy [name] 03/24/2015    RMR: Single colonic polyp removed as described above. Pancolonic diverticulosis. Abnormal sigmoid colon consistent with stigmata  of recent diverticulitis.   . Esophagogastroduodenoscopy [name] 06/28/2015    RMR: mild erosive reflux/small HH    Prior to Admission medications   Medication Sig Start Date End Date Taking? Authorizing Provider  acetaminophen (TYLENOL) 500 MG tablet Take 500 mg by mouth every 6 (six) hours as needed for moderate pain or headache.   Yes Historical Provider, MD  albuterol (PROVENTIL HFA;VENTOLIN HFA) 108 (90 Base) MCG/ACT inhaler Inhale 2 puffs into the lungs every 4 (four) hours as needed for wheezing or shortness of breath. 09/17/15  Yes Merrily Pew, MD  Jearl Klinefelter ELLIPTA 62.5-25 MCG/INH AEPB INHALE 1 PUFF DAILY 09/03/15  Yes Historical Provider, MD  dicyclomine (BENTYL) 10 MG capsule Take 10 mg by mouth 2 (two) times daily.   Yes Historical Provider, MD  esomeprazole (NEXIUM) 40 MG capsule Take 1 capsule (40 mg total) by mouth daily at 12 noon. 09/27/15  Yes Mahala Menghini, PA-C  ibuprofen (ADVIL,MOTRIN) 200 MG tablet Take 200 mg by mouth every 6 (six) hours as needed for mild pain or moderate pain.   Yes Historical Provider, MD  lisinopril-hydrochlorothiazide (PRINZIDE,ZESTORETIC) 20-12.5 MG tablet Take 1 tablet by mouth daily. 06/23/15  Yes Historical Provider, MD  mometasone-formoterol (DULERA) 100-5 MCG/ACT AERO Inhale 2 puffs into the lungs daily as needed for wheezing.   Yes Historical Provider, MD  Multiple Vitamin (MULTIVITAMIN WITH MINERALS) TABS tablet Take 1 tablet by mouth daily.   Yes Historical Provider,  MD  Obeticholic Acid (OCALIVA) 5 MG TABS Take 5 mg by mouth daily. 09/27/15  Yes Mahala Menghini, PA-C  polyethylene glycol (MIRALAX / GLYCOLAX) packet Take 34 g by mouth daily.    Yes Historical Provider, MD  PROAIR RESPICLICK 621 (90 BASE) MCG/ACT AEPB Place 2 puffs into both nostrils every 6 (six) hours as needed (wheezing, shortness of breath).  07/26/15  Yes Historical Provider, MD  lubiprostone (AMITIZA) 24 MCG capsule Take 1 capsule (24 mcg total) by mouth 2 (two) times daily with a  meal. Patient not taking: Reported on 10/12/2015 09/27/15   Mahala Menghini, PA-C    Allergies as of 10/12/2015 - Review Complete 10/12/2015  Allergen Reaction Noted  . Dihydrocodeine Anaphylaxis 09/17/2015  . Hydrocodone Anaphylaxis and Other (See Comments)   . Linzess [linaclotide] Diarrhea 07/26/2015    Family History  Problem Relation Age of Onset  . Pneumonia Father   . Lung cancer Mother   . Lung cancer Father   . Pancreatitis Father     Social History   Social History  . Marital Status: Married    Spouse Name: [name]  . Number of Children: [name]  . Years of Education: [name]   Occupational History  . Not on file.   Social History Main Topics  . Smoking status: Current Some Day Smoker -- 0.50 packs/day for 43 years    Types: Cigarettes  . Smokeless tobacco: Never Used  . Alcohol Use: 0.0 oz/week    0 Standard drinks or equivalent per week     Comment: Maybe once a week  . Drug Use: No  . Sexual Activity:    Partners: Male   Other Topics Concern  . Not on file   Social History Narrative    Review of Systems: See HPI, otherwise negative ROS  Physical Exam: BP 174/98 mmHg  Pulse 87  Temp(Src) 97.6 F (36.4 C) (Oral)  Ht '6\' 1"'$  (1.854 m)  Wt 176 lb 6.4 oz (80.015 kg)  BMI 23.28 kg/m2 General:   pleasant and cooperative in NAD; does not appear acutely ill or toxic. Neck:  Supple; no masses or thyromegaly. No significant cervical adenopathy. Lungs:  Clear throughout to auscultation.   No wheezes, crackles, or rhonchi. No acute distress. Heart:  Regular rate and rhythm; no murmurs, clicks, rubs,  or gallops. Abdomen: Non-distended, normal bowel sounds.  mild diffuse right-sided abdominal tenderness. I do appreciate a loop of bowel overlying his right inguinal area when I palpate. He does have tenderness in this area. Discrete hernia not particularly discernible. Pulses:  Normal pulses noted. Extremities:  Without clubbing or edema.  Impression:    60 year old with  a nearly one year history of of intermittent right-sided abdominal pain - waxing and waning along with perceived constipation. 2 CTs now demonstrated part of his colon down in the left inguinal hernia sac with increased stool burden on the right side. Although he's not obstructed, he may have a bit of a bottle neck because of knuckle of colon in the hernia sac.  Intermittent constipation not reliably improved with a variety of therapeutic agents is more consistent with a mechanical issue as well.  His presentation is not consistent with irritable bowel syndrome. Otherwise, there is no imminent surgical process. I don't think his symptoms have anything to do with his liver. Nothing else inflammatory appearing on CT; his labs actually look good.  Recommendations:   Cautious colonic purge today with Movie prep. If patient's symptoms worsen or if  he starts having nausea / vomiting,  he is to stop the prep and call me. Otherwise, he is to give me a progress report tomorrow morning  Ultimately, He may need to get back to see Dr. Arnoldo Morale regarding this issue and consider repair of left inguinal hernia.  I called Dr. Arnoldo Morale and discussed case with him. He agrees with the plan; .he may need to see Dr. Arnoldo Morale in the near future.  Will continue to move towards insurance approval for [facility] for [facility]        Notice: This dictation was prepared with Dragon dictation along with smaller phrase technology. Any transcriptional errors that result from this process are unintentional and may not be corrected upon review.

## 2015-10-13 ENCOUNTER — Other Ambulatory Visit: Payer: Self-pay

## 2015-10-13 ENCOUNTER — Telehealth: Payer: Self-pay

## 2015-10-13 DIAGNOSIS — K4090 Unilateral inguinal hernia, without obstruction or gangrene, not specified as recurrent: Secondary | ICD-10-CM

## 2015-10-13 NOTE — Telephone Encounter (Signed)
Routing to clinical pool for referral to Dr.Jenkins.

## 2015-10-13 NOTE — Telephone Encounter (Signed)
Pt called with progress report. He said he drank all the prep and he started having good bm's and he was still having bm's this morning. bm's are watery now. Advised him that the prep has done its job by cleaning him out and to make sure he is getting in enough fluids. He said his stomach is feeling better at the moment. He said he feels like he should see Dr.Jenkins to see if he needs surgery.

## 2015-10-13 NOTE — Telephone Encounter (Signed)
Referral made to [facility]

## 2015-10-13 NOTE — Telephone Encounter (Signed)
I am glad he is feeling better. I agree with patient-he needs to see Dr. Arnoldo Morale. Please call and arrange. I spoke to Dr.Jenkins yesterday.

## 2015-10-13 NOTE — Progress Notes (Signed)
Pt is aware. He is taking a vitamin supplement. He wants to wait on the bone density until he finds out if he needs surgery or not. He said he would call back to schedule bone density.

## 2015-10-17 NOTE — Progress Notes (Signed)
Noted  

## 2015-10-25 ENCOUNTER — Telehealth: Payer: Self-pay | Admitting: Internal Medicine

## 2015-10-25 NOTE — Telephone Encounter (Signed)
PLEASE CALL PATIENT REGARDING HIS PRESCRIPTION BEING DENIED BY HIS INSURANCE.  STATED SOMETHING WAS NOT DOCUMENTED

## 2015-11-01 ENCOUNTER — Telehealth: Payer: Self-pay | Admitting: Internal Medicine

## 2015-11-01 ENCOUNTER — Telehealth: Payer: Self-pay

## 2015-11-01 ENCOUNTER — Other Ambulatory Visit: Payer: Self-pay

## 2015-11-01 ENCOUNTER — Ambulatory Visit (HOSPITAL_COMMUNITY)
Admission: RE | Admit: 2015-11-01 | Discharge: 2015-11-01 | Disposition: A | Discharge status: Home / Self Care | Payer: 59 | Source: Ambulatory Visit | Attending: Nurse Practitioner | Admitting: Nurse Practitioner

## 2015-11-01 DIAGNOSIS — K449 Diaphragmatic hernia without obstruction or gangrene: Secondary | ICD-10-CM

## 2015-11-01 DIAGNOSIS — R109 Unspecified abdominal pain: Secondary | ICD-10-CM | POA: Diagnosis not present

## 2015-11-01 DIAGNOSIS — K7460 Unspecified cirrhosis of liver: Secondary | ICD-10-CM

## 2015-11-01 DIAGNOSIS — R1011 Right upper quadrant pain: Secondary | ICD-10-CM

## 2015-11-01 DIAGNOSIS — K743 Primary biliary cirrhosis: Secondary | ICD-10-CM

## 2015-11-01 DIAGNOSIS — K4090 Unilateral inguinal hernia, without obstruction or gangrene, not specified as recurrent: Secondary | ICD-10-CM | POA: Diagnosis not present

## 2015-11-01 DIAGNOSIS — R161 Splenomegaly, not elsewhere classified: Secondary | ICD-10-CM | POA: Insufficient documentation

## 2015-11-01 MED ORDER — IOHEXOL 300 MG/ML  SOLN
100.0000 mL | Freq: Once | INTRAMUSCULAR | Status: AC | PRN
  Administered 2015-11-01: 100 mL via INTRAVENOUS

## 2015-11-01 MED ORDER — DIATRIZOATE MEGLUMINE & SODIUM 66-10 % PO SOLN
ORAL | Status: AC
  Filled 2015-11-01: qty 30

## 2015-11-01 NOTE — Telephone Encounter (Signed)
Pt called asking to be seen today. I told him nothing was available today and then he wanted to speak with RMR's nurse. Call transferred to [facility]

## 2015-11-01 NOTE — Telephone Encounter (Signed)
error 

## 2015-11-01 NOTE — Telephone Encounter (Signed)
Pain 8/10, burning. Cannot tell if there's a difference in appearance of his hernia. At this point let's order a STAT CT abdomen with IV and oral contrast to r/o complication of hernia including incarceration, ischemia, etc.

## 2015-11-01 NOTE — Progress Notes (Signed)
Quick Note:  No acute findings on CT. Recommend non-urgent referral to Dr. Arnoldo Morale for consideration of elective hernia repair. No obstruction. ______

## 2015-11-01 NOTE — Telephone Encounter (Signed)
Pt is aware. Ok to set up stat CT. Please set up.

## 2015-11-01 NOTE — Telephone Encounter (Signed)
I have called and spoke with his insurance, spoke with Jordan.  They were denying it d/t no documentation of failure of linzess. I advised Cyril Mourning that linzess was not a question on the form, only miralax and lactulose. She said sometimes they dont include all the questions, but couldn't explain to me why they didn't include all the questions on the form.  Both have been corrected and resubmitted.

## 2015-11-01 NOTE — Telephone Encounter (Signed)
Spoke with the pt, he did not have a bm for a few days because the amitiza doesn't seem to be working well, he took miralax, dulcolax and finally drank some mag citrate yesterday and started having diarrhea. Pt is still having diarrhea this morning. He is on a liquid diet.  He is not complaining about the diarrhea. His complaint is he is having a burning sensation across his abdomen now. He wants to know if there is anything we can do about the burning?   Routing to EG d/t RMR and LSL are out today.

## 2015-11-01 NOTE — Telephone Encounter (Signed)
CT ordered. Pt is aware to proceed to hosptial.  No pre-cert required per [facility] online.

## 2015-11-01 NOTE — Telephone Encounter (Signed)
Pt is aware.  

## 2015-11-01 NOTE — Telephone Encounter (Signed)
PT CALLED AT [phone]. HE SAID HE HAD A CT SCAN AND IT WAS NORMAL AND HE HAD A BURNING SENSATION IN HIS ABDOMEN AND NEEDED SOMETHING FOR PAIN. I EXPLAINED TO HIM THAT IF HE HAS A NORMAL CT THEN WE DO NOT PRESCRIBE NARCOTICS FOR CHRONIC ABDOMINAL PAIN. I TOLD HIM I WOULD CALL CT AND CONFIRM HIS CT IS NORMAL AND IF SO H WILL NEED TO CALL THE [facility] TO SPEAK WITH DR. Gala Romney ON TOMORROW. I CALLED CT AND THEY GAVE ME THE IMPRESSION FOR HIS CT. CT SHOWED NAIAP.

## 2015-11-01 NOTE — Telephone Encounter (Signed)
Pt called office from the hospital requesting some dilaudid for pain during his CT scan.  I informed pt that we don't provide pain medication for CT scans.   Pt is aware and he states he is going to needs something for pain  Routing to Walden Field to make him aware

## 2015-11-02 ENCOUNTER — Telehealth: Payer: Self-pay

## 2015-11-02 NOTE — Telephone Encounter (Signed)
He has an appointment in 2 days for surgical evaluation related to his hernia.

## 2015-11-02 NOTE — Telephone Encounter (Signed)
Pt wants to know if he can get something for the pain until he sees Dr.Jenkins on Thursday. Please see CT note and prior phone note

## 2015-11-02 NOTE — Telephone Encounter (Signed)
Nothing acute on recent CT. Agree with surgical referral.

## 2015-11-04 ENCOUNTER — Encounter: Payer: Self-pay | Admitting: Internal Medicine

## 2015-11-04 NOTE — Telephone Encounter (Signed)
Pt has ov with Dr.Jenkins today.

## 2015-11-04 NOTE — Telephone Encounter (Signed)
Will defer to Dr. Arnoldo Morale

## 2015-11-09 NOTE — H&P (Signed)
  NTS SOAP Note  Vital Signs:  Vitals as of: 9/67/2897: Systolic 915: Diastolic 99: Heart Rate 84: Temp 98.8F (Temporal): Height 13f 1in: Weight 174Lbs 0 Ounces: Pain Level 9: BMI 22.96   BMI : 22.96 kg/m2  Subjective: This 60year old male presents for of a left inguinal hernia.  Has been worsening over the past few months.  Made worse with straining.  Sticks out on [employer]  Thinks it may be causing constipation.  Has had an acute exacerbation of his COPD.  Review of Symptoms:  Constitutional:unremarkable   Head:unremarkable Eyes:unremarkable   Nose/Mouth/Throat:unremarkable Cardiovascular:  unremarkable Respiratory:dyspnea, cough Gastrointestinheartburn Genitourinary:unremarkable   back and neck pain Skin:unremarkable Hematolgic/Lymphatic:unremarkable   Allergic/Immunologic:unremarkable   Past Medical History:  Reviewed  Past Medical History  Surgical History: Right inguinal herniorrhaphy, cholecystectomy Medical Problems: unspecified hepatitis, copd, htn Allergies: hydrocodone, linzess, dihydrocodeine Medications: proair, dexilant, lisinopril   Social History:Reviewed  Social History  Preferred Language: English ([place]) Age: [age]ears 2 Months Marital Status:  M Alcohol: socially Recreational drug(s):  No   Smoking Status: Current every day smoker reviewed on 11/04/2015 Started Date: 09/18/1978 Packs per day: 1.00 Functional Status reviewed on 11/04/2015 ------------------------------------------------ Bathing: Normal Cooking: Normal Dressing: Normal Driving: Normal Eating: Normal Managing Meds: Normal Oral Care: Normal Shopping: Normal Toileting: Normal Transferring: Normal Walking: Normal Cognitive Status reviewed on 11/04/2015 ------------------------------------------------ Attention: Normal Decision Making: Normal Language: Normal Memory: Normal Motor: Normal Perception: Normal Problem Solving: Normal Visual and  Spatial: Normal   Family History:Reviewed  Family Health History Family History is Unknown    Objective Information: General:Well appearing, well nourished in no distress. Heart:RRR, no murmur Course rhonchi bilaterally.  No wheezing noted. Abdomen:Soft, NT/ND, no HSM, no masses.  Reducible left inguinal hernia noted. GWC:[id number]   Assessment:Left nguinal hernia  Diagnoses: 550.90  K40.90 Inguinal hernia (Unilateral inguinal hernia, without obstruction or gangrene, not specified as recurrent)  Procedures: 99214 - OFFICE OUTPATIENT VISIT 25 MINUTES    Plan:  Will call to scheduled left inguinal herniorrhaphy with mesh once breathing under better control.  Will need spinal anesthetic.   Patient Education:Alternative treatments to surgery were discussed with patient (and family).  Risks and benefits  of procedure including bleeding, infection, mesh use, and the possibility of recurrence of the hernia were fully explained to the patient (and family) who gave informed consent. Patient/family questions were addressed.  Follow-up:Pending Surgery

## 2015-11-09 NOTE — Patient Instructions (Signed)
Jeffrey Gutierrez  11/09/2015     '@PREFPERIOPPHARMACY'$ @   Your procedure is scheduled on 11/12/2015.  Report to [facility] at 7:30 A.M.  Call this number if you have problems the morning of surgery:  [phone]   Remember:  Do not eat food or drink liquids after midnight.  Take these medicines the morning of surgery with A SIP OF WATER Albuterol inhaler (also bring with you to hospital), Ellipta inhaler, Bentyl, Nexium, Dulera inhaler   Do not wear jewelry, make-up or nail polish.  Do not wear lotions, powders, or perfumes.  You may wear deodorant.  Do not shave 48 hours prior to surgery.  Men may shave face and neck.  Do not bring valuables to the hospital.  [facility] is not responsible for any belongings or valuables.  Contacts, dentures or bridgework may not be worn into surgery.  Leave your suitcase in the car.  After surgery it may be brought to your room.  For patients admitted to the hospital, discharge time will be determined by your treatment team.  Patients discharged the day of surgery will not be allowed to drive home.   Please read over the following fact sheets that you were given. Surgical Site Infection Prevention and Anesthesia Post-op Instructions     PATIENT INSTRUCTIONS POST-ANESTHESIA  IMMEDIATELY FOLLOWING SURGERY:  Do not drive or operate machinery for the first twenty four hours after surgery.  Do not make any important decisions for twenty four hours after surgery or while taking narcotic pain medications or sedatives.  If you develop intractable nausea and vomiting or a severe headache please notify your doctor immediately.  FOLLOW-UP:  Please make an appointment with your surgeon as instructed. You do not need to follow up with anesthesia unless specifically instructed to do so.  WOUND CARE INSTRUCTIONS (if applicable):  Keep a dry clean dressing on the anesthesia/puncture wound site if there is drainage.  Once the wound has quit draining you may  leave it open to air.  Generally you should leave the bandage intact for twenty four hours unless there is drainage.  If the epidural site drains for more than 36-48 hours please call the anesthesia department.  QUESTIONS?:  Please feel free to call your physician or the hospital operator if you have any questions, and they will be happy to assist you.      Inguinal Hernia, Adult Muscles help keep everything in the body in its proper place. But if a weak spot in the muscles develops, something can poke through. That is called a hernia. When this happens in the lower part of the belly (abdomen), it is called an inguinal hernia. (It takes its name from a part of the body in this region called the inguinal canal.) A weak spot in the wall of muscles lets some fat or part of the small intestine bulge through. An inguinal hernia can develop at any age. Men get them more often than women. CAUSES  In adults, an inguinal hernia develops over time.  It can be triggered by:  Suddenly straining the muscles of the lower abdomen.  Lifting heavy objects.  Straining to have a bowel movement. Difficult bowel movements (constipation) can lead to this.  Constant coughing. This may be caused by smoking or lung disease.  Being overweight.  Being pregnant.  Working at a job that requires long periods of standing or heavy lifting.  Having had an inguinal hernia before. One type can be an emergency situation.  It is called a strangulated inguinal hernia. It develops if part of the small intestine slips through the weak spot and cannot get back into the abdomen. The blood supply can be cut off. If that happens, part of the intestine may die. This situation requires emergency surgery. SYMPTOMS  Often, a small inguinal hernia has no symptoms. It is found when a healthcare provider does a physical exam. Larger hernias usually have symptoms.   In adults, symptoms may include:  A lump in the groin. This is easier  to see when the person is standing. It might disappear when lying down.  In men, a lump in the scrotum.  Pain or burning in the groin. This occurs especially when lifting, straining or coughing.  A dull ache or feeling of pressure in the groin.  Signs of a strangulated hernia can include:  A bulge in the groin that becomes very painful and tender to the touch.  A bulge that turns red or purple.  Fever, nausea and vomiting.  Inability to have a bowel movement or to pass gas. DIAGNOSIS  To decide if you have an inguinal hernia, a healthcare provider will probably do a physical examination.  This will include asking questions about any symptoms you have noticed.  The healthcare provider might feel the groin area and ask you to cough. If an inguinal hernia is felt, the healthcare provider may try to slide it back into the abdomen.  Usually no other tests are needed. TREATMENT  Treatments can vary. The size of the hernia makes a difference. Options include:  Watchful waiting. This is often suggested if the hernia is small and you have had no symptoms.  No medical procedure will be done unless symptoms develop.  You will need to watch closely for symptoms. If any occur, contact your healthcare provider right away.  Surgery. This is used if the hernia is larger or you have symptoms.  Open surgery. This is usually an outpatient procedure (you will not stay overnight in a hospital). An cut (incision) is made through the skin in the groin. The hernia is put back inside the abdomen. The weak area in the muscles is then repaired by herniorrhaphy or hernioplasty. Herniorrhaphy: in this type of surgery, the weak muscles are sewn back together. Hernioplasty: a patch or mesh is used to close the weak area in the abdominal wall.  Laparoscopy. In this procedure, a surgeon makes small incisions. A thin tube with a tiny video camera (called a laparoscope) is put into the abdomen. The surgeon  repairs the hernia with mesh by looking with the video camera and using two long instruments. HOME CARE INSTRUCTIONS   After surgery to repair an inguinal hernia:  You will need to take pain medicine prescribed by your healthcare provider. Follow all directions carefully.  You will need to take care of the wound from the incision.  Your activity will be restricted for awhile. This will probably include no heavy lifting for several weeks. You also should not do anything too active for a few weeks. When you can return to work will depend on the type of job that you have.  During "watchful waiting" periods, you should:  Maintain a healthy weight.  Eat a diet high in fiber (fruits, vegetables and whole grains).  Drink plenty of fluids to avoid constipation. This means drinking enough water and other liquids to keep your urine clear or pale yellow.  Do not lift heavy objects.  Do not stand for  long periods of time.  Quit smoking. This should keep you from developing a frequent cough. SEEK MEDICAL CARE IF:   A bulge develops in your groin area.  You feel pain, a burning sensation or pressure in the groin. This might be worse if you are lifting or straining.  You develop a fever of more than 100.5 F (38.1 C). SEEK IMMEDIATE MEDICAL CARE IF:   Pain in the groin increases suddenly.  A bulge in the groin gets bigger suddenly and does not go down.  For men, there is sudden pain in the scrotum. Or, the size of the scrotum increases.  A bulge in the groin area becomes red or purple and is painful to touch.  You have nausea or vomiting that does not go away.  You feel your heart beating much faster than normal.  You cannot have a bowel movement or pass gas.  You develop a fever of more than 102.0 F (38.9 C).   This information is not intended to replace advice given to you by your health care provider. Make sure you discuss any questions you have with your health care  provider.   Document Released: 01/21/2009 Document Revised: 11/27/2011 Document Reviewed: 03/08/2015 Elsevier Interactive Patient Education [employer].

## 2015-11-11 ENCOUNTER — Ambulatory Visit (HOSPITAL_COMMUNITY)
Admission: RE | Admit: 2015-11-11 | Discharge: 2015-11-11 | Disposition: A | Discharge status: Home / Self Care | Payer: 59 | Source: Ambulatory Visit | Attending: General Surgery | Admitting: General Surgery

## 2015-11-11 ENCOUNTER — Encounter (HOSPITAL_COMMUNITY)
Admission: RE | Admit: 2015-11-11 | Discharge: 2015-11-11 | Disposition: A | Discharge status: Home / Self Care | Payer: 59 | Source: Ambulatory Visit | Attending: General Surgery | Admitting: General Surgery

## 2015-11-11 ENCOUNTER — Encounter (HOSPITAL_COMMUNITY): Payer: Self-pay

## 2015-11-11 DIAGNOSIS — J449 Chronic obstructive pulmonary disease, unspecified: Secondary | ICD-10-CM

## 2015-11-11 LAB — CBC WITH DIFFERENTIAL/PLATELET
BASOS ABS: 0 10*3/uL (ref 0.0–0.1)
Basophils Relative: 1 %
EOS PCT: 1 %
Eosinophils Absolute: 0 10*3/uL (ref 0.0–0.7)
HCT: 42.3 % (ref 39.0–52.0)
HEMOGLOBIN: 14.3 g/dL (ref 13.0–17.0)
LYMPHS ABS: 1.4 10*3/uL (ref 0.7–4.0)
LYMPHS PCT: 38 %
MCH: 29.9 pg (ref 26.0–34.0)
MCHC: 33.8 g/dL (ref 30.0–36.0)
MCV: 88.3 fL (ref 78.0–100.0)
Monocytes Absolute: 0.5 10*3/uL (ref 0.1–1.0)
Monocytes Relative: 14 %
NEUTROS PCT: 46 %
Neutro Abs: 1.7 10*3/uL (ref 1.7–7.7)
PLATELETS: 172 10*3/uL (ref 150–400)
RBC: 4.79 MIL/uL (ref 4.22–5.81)
RDW: 14 % (ref 11.5–15.5)
WBC: 3.7 10*3/uL — AB (ref 4.0–10.5)

## 2015-11-11 LAB — BASIC METABOLIC PANEL
ANION GAP: 6 (ref 5–15)
BUN: 8 mg/dL (ref 6–20)
CHLORIDE: 99 mmol/L — AB (ref 101–111)
CO2: 29 mmol/L (ref 22–32)
Calcium: 8.6 mg/dL — ABNORMAL LOW (ref 8.9–10.3)
Creatinine, Ser: 0.82 mg/dL (ref 0.61–1.24)
Glucose, Bld: 143 mg/dL — ABNORMAL HIGH (ref 65–99)
POTASSIUM: 4.3 mmol/L (ref 3.5–5.1)
SODIUM: 134 mmol/L — AB (ref 135–145)

## 2015-11-11 NOTE — Pre-Procedure Instructions (Signed)
Patient given information to sign up for my chart at home. 

## 2015-11-11 NOTE — Progress Notes (Signed)
   11/11/15 0815  OBSTRUCTIVE SLEEP APNEA  Have you ever been diagnosed with sleep apnea through a sleep study? No  Do you snore loudly (loud enough to be heard through closed doors)?  1  Do you often feel tired, fatigued, or sleepy during the daytime (such as falling asleep during driving or talking to someone)? 0  Has anyone observed you stop breathing during your sleep? 0  Do you have, or are you being treated for high blood pressure? 1  BMI more than 35 kg/m2? 0  Age > 50 (1-yes) 1  Neck circumference greater than:Male 16 inches or larger, Male 17inches or larger? 0  Male Gender (Yes=1) 1  Obstructive Sleep Apnea Score 4  Score 5 or greater  No PCP

## 2015-11-12 ENCOUNTER — Encounter (HOSPITAL_COMMUNITY)
Admission: RE | Disposition: A | Discharge status: Home / Self Care | Payer: Self-pay | Source: Ambulatory Visit | Attending: General Surgery

## 2015-11-12 ENCOUNTER — Ambulatory Visit (HOSPITAL_COMMUNITY): Payer: 59 | Admitting: Anesthesiology

## 2015-11-12 ENCOUNTER — Ambulatory Visit (HOSPITAL_COMMUNITY)
Admission: RE | Admit: 2015-11-12 | Discharge: 2015-11-12 | Disposition: A | Discharge status: Home / Self Care | Payer: 59 | Source: Ambulatory Visit | Attending: General Surgery | Admitting: General Surgery

## 2015-11-12 ENCOUNTER — Encounter (HOSPITAL_COMMUNITY): Payer: Self-pay | Admitting: Anesthesiology

## 2015-11-12 DIAGNOSIS — I10 Essential (primary) hypertension: Secondary | ICD-10-CM | POA: Diagnosis not present

## 2015-11-12 DIAGNOSIS — F17200 Nicotine dependence, unspecified, uncomplicated: Secondary | ICD-10-CM | POA: Diagnosis not present

## 2015-11-12 DIAGNOSIS — K4090 Unilateral inguinal hernia, without obstruction or gangrene, not specified as recurrent: Secondary | ICD-10-CM | POA: Diagnosis not present

## 2015-11-12 DIAGNOSIS — K219 Gastro-esophageal reflux disease without esophagitis: Secondary | ICD-10-CM | POA: Diagnosis not present

## 2015-11-12 DIAGNOSIS — J449 Chronic obstructive pulmonary disease, unspecified: Secondary | ICD-10-CM | POA: Diagnosis not present

## 2015-11-12 HISTORY — PX: INGUINAL HERNIA REPAIR: SHX194

## 2015-11-12 HISTORY — PX: INSERTION OF MESH: SHX5868

## 2015-11-12 SURGERY — REPAIR, HERNIA, INGUINAL, ADULT
Anesthesia: General | Site: Inguinal | Laterality: Left

## 2015-11-12 MED ORDER — SODIUM CHLORIDE 0.9 % IJ SOLN
INTRAMUSCULAR | Status: AC
  Filled 2015-11-12: qty 10

## 2015-11-12 MED ORDER — PHENYLEPHRINE HCL 10 MG/ML IJ SOLN
INTRAMUSCULAR | Status: DC | PRN
  Administered 2015-11-12: 40 ug via INTRAVENOUS
  Administered 2015-11-12: 60 ug via INTRAVENOUS
  Administered 2015-11-12: 40 ug via INTRAVENOUS

## 2015-11-12 MED ORDER — GLYCOPYRROLATE 0.2 MG/ML IJ SOLN
INTRAMUSCULAR | Status: AC
  Filled 2015-11-12: qty 1

## 2015-11-12 MED ORDER — CHLORHEXIDINE GLUCONATE 4 % EX LIQD: 1.0000 "application " | Freq: Once | CUTANEOUS | Status: DC

## 2015-11-12 MED ORDER — CEFAZOLIN SODIUM-DEXTROSE 2-3 GM-% IV SOLR
INTRAVENOUS | Status: AC
  Filled 2015-11-12: qty 50

## 2015-11-12 MED ORDER — PROPOFOL 10 MG/ML IV BOLUS
INTRAVENOUS | Status: AC
  Filled 2015-11-12: qty 20

## 2015-11-12 MED ORDER — CEFAZOLIN SODIUM-DEXTROSE 2-3 GM-% IV SOLR
2.0000 g | INTRAVENOUS | Status: AC
  Administered 2015-11-12: 2 g via INTRAVENOUS

## 2015-11-12 MED ORDER — ONDANSETRON HCL 4 MG/2ML IJ SOLN
INTRAMUSCULAR | Status: AC
Start: 2015-11-12 — End: 2015-11-12
  Filled 2015-11-12: qty 2

## 2015-11-12 MED ORDER — FENTANYL CITRATE (PF) 100 MCG/2ML IJ SOLN
25.0000 ug | INTRAMUSCULAR | Status: AC
  Administered 2015-11-12: 25 ug via INTRAVENOUS

## 2015-11-12 MED ORDER — KETOROLAC TROMETHAMINE 30 MG/ML IJ SOLN
30.0000 mg | Freq: Once | INTRAMUSCULAR | Status: AC
  Administered 2015-11-12: 30 mg via INTRAVENOUS

## 2015-11-12 MED ORDER — LIDOCAINE HCL (PF) 1 % IJ SOLN
INTRAMUSCULAR | Status: AC
  Filled 2015-11-12: qty 5

## 2015-11-12 MED ORDER — 0.9 % SODIUM CHLORIDE (POUR BTL) OPTIME
TOPICAL | Status: DC | PRN
  Administered 2015-11-12: 1000 mL

## 2015-11-12 MED ORDER — PROPOFOL 10 MG/ML IV BOLUS
INTRAVENOUS | Status: DC | PRN
  Administered 2015-11-12: 180 mg via INTRAVENOUS

## 2015-11-12 MED ORDER — EPHEDRINE SULFATE 50 MG/ML IJ SOLN
INTRAMUSCULAR | Status: AC
  Filled 2015-11-12: qty 1

## 2015-11-12 MED ORDER — MIDAZOLAM HCL 2 MG/2ML IJ SOLN
1.0000 mg | INTRAMUSCULAR | Status: DC | PRN
  Administered 2015-11-12: 2 mg via INTRAVENOUS

## 2015-11-12 MED ORDER — MIDAZOLAM HCL 2 MG/2ML IJ SOLN
INTRAMUSCULAR | Status: AC
  Filled 2015-11-12: qty 2

## 2015-11-12 MED ORDER — FENTANYL CITRATE (PF) 100 MCG/2ML IJ SOLN
INTRAMUSCULAR | Status: AC
  Filled 2015-11-12: qty 2

## 2015-11-12 MED ORDER — FENTANYL CITRATE (PF) 250 MCG/5ML IJ SOLN
INTRAMUSCULAR | Status: DC | PRN
  Administered 2015-11-12: 50 ug via INTRAVENOUS
  Administered 2015-11-12: 25 ug via INTRAVENOUS
  Administered 2015-11-12: 50 ug via INTRAVENOUS
  Administered 2015-11-12 (×3): 25 ug via INTRAVENOUS

## 2015-11-12 MED ORDER — BUPIVACAINE LIPOSOME 1.3 % IJ SUSP
INTRAMUSCULAR | Status: AC
  Filled 2015-11-12: qty 20

## 2015-11-12 MED ORDER — FENTANYL CITRATE (PF) 250 MCG/5ML IJ SOLN
INTRAMUSCULAR | Status: AC
Start: 2015-11-12 — End: 2015-11-12
  Filled 2015-11-12: qty 5

## 2015-11-12 MED ORDER — ONDANSETRON HCL 4 MG/2ML IJ SOLN: 4.0000 mg | Freq: Once | INTRAMUSCULAR | Status: DC | PRN

## 2015-11-12 MED ORDER — ONDANSETRON HCL 4 MG/2ML IJ SOLN
4.0000 mg | Freq: Once | INTRAMUSCULAR | Status: AC
  Administered 2015-11-12: 4 mg via INTRAVENOUS

## 2015-11-12 MED ORDER — FENTANYL CITRATE (PF) 100 MCG/2ML IJ SOLN
25.0000 ug | INTRAMUSCULAR | Status: DC | PRN
  Administered 2015-11-12 (×2): 50 ug via INTRAVENOUS
  Filled 2015-11-12: qty 2

## 2015-11-12 MED ORDER — GLYCOPYRROLATE 0.2 MG/ML IJ SOLN
0.2000 mg | Freq: Once | INTRAMUSCULAR | Status: AC
  Administered 2015-11-12: 0.2 mg via INTRAVENOUS

## 2015-11-12 MED ORDER — TRAMADOL HCL 50 MG PO TABS: 50.0000 mg | ORAL_TABLET | Freq: Four times a day (QID) | ORAL | Status: DC | PRN

## 2015-11-12 MED ORDER — KETOROLAC TROMETHAMINE 30 MG/ML IJ SOLN
INTRAMUSCULAR | Status: AC
Start: 2015-11-12 — End: 2015-11-12
  Filled 2015-11-12: qty 1

## 2015-11-12 MED ORDER — LACTATED RINGERS IV SOLN
INTRAVENOUS | Status: DC
  Administered 2015-11-12 (×2): via INTRAVENOUS

## 2015-11-12 MED ORDER — BUPIVACAINE LIPOSOME 1.3 % IJ SUSP
INTRAMUSCULAR | Status: DC | PRN
  Administered 2015-11-12: 20 mL

## 2015-11-12 MED ORDER — LIDOCAINE HCL (CARDIAC) 20 MG/ML IV SOLN
INTRAVENOUS | Status: DC | PRN
  Administered 2015-11-12: 40 mg via INTRATRACHEAL

## 2015-11-12 SURGICAL SUPPLY — 39 items
BAG HAMPER (MISCELLANEOUS) ×3 IMPLANT
CLOTH BEACON ORANGE TIMEOUT ST (SAFETY) ×3 IMPLANT
COVER LIGHT HANDLE STERIS (MISCELLANEOUS) ×6 IMPLANT
DERMABOND ADVANCED (GAUZE/BANDAGES/DRESSINGS) ×2
DERMABOND ADVANCED .7 DNX12 (GAUZE/BANDAGES/DRESSINGS) ×1 IMPLANT
DRAIN PENROSE 18X1/2 LTX STRL (DRAIN) ×3 IMPLANT
ELECT REM PT RETURN 9FT ADLT (ELECTROSURGICAL) ×3
ELECTRODE REM PT RTRN 9FT ADLT (ELECTROSURGICAL) ×1 IMPLANT
GLOVE BIOGEL PI IND STRL 7.0 (GLOVE) ×3 IMPLANT
GLOVE BIOGEL PI INDICATOR 7.0 (GLOVE) ×6
GLOVE ECLIPSE 6.5 STRL STRAW (GLOVE) ×9 IMPLANT
GLOVE EXAM NITRILE XS STR PU (GLOVE) ×3 IMPLANT
GLOVE SURG SS PI 7.5 STRL IVOR (GLOVE) ×3 IMPLANT
GOWN STRL REUS W/ TWL XL LVL3 (GOWN DISPOSABLE) ×1 IMPLANT
GOWN STRL REUS W/TWL LRG LVL3 (GOWN DISPOSABLE) ×6 IMPLANT
GOWN STRL REUS W/TWL XL LVL3 (GOWN DISPOSABLE) ×2
INST SET MINOR GENERAL (KITS) ×3 IMPLANT
KIT ROOM TURNOVER APOR (KITS) ×3 IMPLANT
MANIFOLD NEPTUNE II (INSTRUMENTS) ×3 IMPLANT
MESH HERNIA 1.6X1.9 PLUG LRG (Mesh General) ×1 IMPLANT
MESH HERNIA PLUG LRG (Mesh General) ×2 IMPLANT
NEEDLE HYPO 18GX1.5 BLUNT FILL (NEEDLE) ×3 IMPLANT
NEEDLE HYPO 21X1.5 SAFETY (NEEDLE) ×3 IMPLANT
NS IRRIG 1000ML POUR BTL (IV SOLUTION) ×3 IMPLANT
PACK MINOR (CUSTOM PROCEDURE TRAY) ×3 IMPLANT
PAD ARMBOARD 7.5X6 YLW CONV (MISCELLANEOUS) ×3 IMPLANT
SCRUB PCMX 4 OZ (MISCELLANEOUS) ×3 IMPLANT
SET BASIN LINEN APH (SET/KITS/TRAYS/PACK) ×3 IMPLANT
SUT NOVA NAB GS-22 2 2-0 T-19 (SUTURE) ×12 IMPLANT
SUT PROLENE 2 0 SH 30 (SUTURE) IMPLANT
SUT SILK 3 0 (SUTURE)
SUT SILK 3-0 18XBRD TIE 12 (SUTURE) IMPLANT
SUT VIC AB 2-0 CT1 27 (SUTURE) ×2
SUT VIC AB 2-0 CT1 TAPERPNT 27 (SUTURE) ×1 IMPLANT
SUT VIC AB 3-0 SH 27 (SUTURE) ×2
SUT VIC AB 3-0 SH 27X BRD (SUTURE) ×1 IMPLANT
SUT VIC AB 4-0 PS2 27 (SUTURE) ×3 IMPLANT
SUT VICRYL AB 3 0 TIES (SUTURE) IMPLANT
SYR 20CC LL (SYRINGE) ×3 IMPLANT

## 2015-11-12 NOTE — Interval H&P Note (Signed)
History and Physical Interval Note:  11/12/2015 8:15 AM  Jeffrey Gutierrez  has presented today for surgery, with the diagnosis of left inguinal hernia  The various methods of treatment have been discussed with the patient and family. After consideration of risks, benefits and other options for treatment, the patient has consented to  Procedure(s): HERNIA REPAIR INGUINAL ADULT WITH MESH (Left) as a surgical intervention .  The patient's history has been reviewed, patient examined, no change in status, stable for surgery.  I have reviewed the patient's chart and labs.  Questions were answered to the patient's satisfaction.     Aviva Signs A

## 2015-11-12 NOTE — Anesthesia Postprocedure Evaluation (Signed)
Anesthesia Post Note  Patient: Jeffrey Gutierrez  Procedure(s) Performed: Procedure(s) (LRB): HERNIA REPAIR INGUINAL ADULT WITH MESH (Left) INSERTION OF MESH (Left)  Patient location during evaluation: PACU Anesthesia Type: General Level of consciousness: awake and alert and oriented Pain management: pain level controlled Vital Signs Assessment: post-procedure vital signs reviewed and stable Respiratory status: spontaneous breathing Cardiovascular status: stable Postop Assessment: no signs of nausea or vomiting Anesthetic complications: no    Last Vitals:  Filed Vitals:   11/12/15 1015 11/12/15 1030  BP: 156/87   Pulse: 80 82  Temp:    Resp: 19 16    Last Pain:  Filed Vitals:   11/12/15 1032  PainSc: 4                  Ledon Weihe A

## 2015-11-12 NOTE — Op Note (Signed)
Patient:  Jeffrey Gutierrez  DOB:  24-Nov-1955  MRN:  [id number]   Preop Diagnosis:  Left inguinal hernia  Postop Diagnosis:  Same  Procedure:  Left inguinal herniorrhaphy with mesh  Surgeon:  Aviva Signs, M.D.  Anes:  Gen.  Indications:  Patient is a 60 year old white male who presents with a symptomatic left inguinal hernia. The risks and benefits of the procedure including bleeding, infection, mesh use, and the possibility of recurrence of the hernia were fully explained to the patient, who gave informed consent.  Procedure note:  The patient was placed in the supine position. After general anesthesia was administered, the left groin region was prepped and draped using the usual sterile technique with PCMX. Surgical site confirmation was performed.  A transverse incision was made in the left groin region down to the external oblique aponeuroses. The aponeurosis was incised to the external ring. A Penrose drain was placed around the spermatic cord. The vase deferens was noted within the spermatic cord. The ilioinguinal nerve was identified and retracted inferiorly from the operative field. The patient had a large direct hernia sac. This was incised at its base and inverted. A large Bard PerFix plug was then inserted and secured circumferentially to the transversalis fascia using 2-0 Novafil interrupted sutures. An onlay patch was then placed along the floor of inguinal canal and secured superiorly to the conjoined tendon and inferiorly to the shelving edge of Poupart's ligament using 2-0 Novafil interrupted sutures. The internal ring was re-created using a 2-0 Novafil interrupted suture. The external oblique aponeuroses was reapproximated using a 2-0 Vicryl running suture. The subcutaneous layer was reapproximated using a 3-0 Vicryl interrupted suture. The skin was closed using a 4-0 Vicryl subcuticular suture. Exparel was instilled into the surrounding wound. Dermabond was then applied.  All  tape and needle counts were correct at the end of the procedure. Patient was awakened and transferred to PACU in stable condition.   Complications: None EBL: Minimal Specimen: None

## 2015-11-12 NOTE — Anesthesia Procedure Notes (Signed)
Procedure Name: LMA Insertion Date/Time: 11/12/2015 8:38 AM Performed by: Tressie Stalker E Pre-anesthesia Checklist: Patient identified, Patient being monitored, Emergency Drugs available, Timeout performed and Suction available Patient Re-evaluated:Patient Re-evaluated prior to inductionOxygen Delivery Method: Circle System Utilized Preoxygenation: Pre-oxygenation with 100% oxygen Intubation Type: IV induction Ventilation: Mask ventilation without difficulty LMA: LMA inserted LMA Size: 4.0 Number of attempts: 1 Placement Confirmation: positive ETCO2 and breath sounds checked- equal and bilateral

## 2015-11-12 NOTE — Discharge Instructions (Signed)
Open Hernia Repair, Care After Refer to this sheet in the next few weeks. These instructions provide you with information on caring for yourself after your procedure. Your health care provider may also give you more specific instructions. Your treatment has been planned according to current medical practices, but problems sometimes occur. Call your health care provider if you have any problems or questions after your procedure. WHAT TO EXPECT AFTER THE PROCEDURE After your procedure, it is typical to have the following:  Pain in your abdomen, especially along your incision. You will be given pain medicines to control the pain.  Constipation. You may be given a stool softener to help prevent this. HOME CARE INSTRUCTIONS  Only take over-the-counter or prescription medicines as directed by your health care provider.  Keep the incision area dry and clean. You may wash the incision area gently with soap and water 48 hours after surgery. Gently blot or dab the incision area dry. Do not take baths, use swimming pools, or use hot tubs for 10 days or until your health care provider approves.  Change bandages (dressings) as directed by your health care provider.  Continue your normal diet as directed by your health care provider. Eat plenty of fruits and vegetables to help prevent constipation.  Drink enough fluids to keep your urine clear or pale yellow. This also helps prevent constipation.  Do not drive until your health care provider says it is okay.  Do not lift anything heavier than 10 lb (4.5 kg) or play contact sports for 4 weeks or until your health care provider approves.  Follow up with your health care provider as directed. Ask your health care provider when to make an appointment to have your stitches (sutures) or staples removed. SEEK MEDICAL CARE IF:  You have increased bleeding coming from the incision site.  You have blood in your stool.  You have increasing pain in the incision  area.  You see redness or swelling in the incision area.  You have fluid (pus) coming from the incision.  You have a fever.  You notice a bad smell coming from the incision area or dressing. SEEK IMMEDIATE MEDICAL CARE IF:  You develop a rash.  You have chest pain or shortness of breath.  You feel lightheaded or feel faint.   This information is not intended to replace advice given to you by your health care provider. Make sure you discuss any questions you have with your health care provider.   Document Released: 03/24/2005 Document Revised: 09/25/2014 Document Reviewed: 04/16/2013 Elsevier Interactive Patient Education [employer].

## 2015-11-12 NOTE — Anesthesia Preprocedure Evaluation (Signed)
Anesthesia Evaluation  Patient identified by MRN, date of birth, ID band Patient awake    Reviewed: Allergy & Precautions, NPO status , Patient's Chart, lab work & pertinent test results  Airway Mallampati: III  TM Distance: >3 FB     Dental  (+) Teeth Intact, Dental Advisory Given   Pulmonary COPD,  COPD inhaler, Current Smoker,    breath sounds clear to auscultation       Cardiovascular hypertension, Pt. on medications  Rhythm:Regular Rate:Normal     Neuro/Psych    GI/Hepatic GERD  Medicated and Controlled,(+) Cirrhosis       ,   Endo/Other    Renal/GU      Musculoskeletal   Abdominal   Peds  Hematology   Anesthesia Other Findings   Reproductive/Obstetrics                             Anesthesia Physical Anesthesia Plan  ASA: III  Anesthesia Plan: General   Post-op Pain Management:    Induction: Intravenous  Airway Management Planned: LMA  Additional Equipment:   Intra-op Plan:   Post-operative Plan: Extubation in OR  Informed Consent: I have reviewed the patients History and Physical, chart, labs and discussed the procedure including the risks, benefits and alternatives for the proposed anesthesia with the patient or authorized representative who has indicated his/her understanding and acceptance.     Plan Discussed with:   Anesthesia Plan Comments:         Anesthesia Quick Evaluation

## 2015-11-12 NOTE — Progress Notes (Signed)
Time entered incorrectly. Should be @ 0938 not 0838.

## 2015-11-12 NOTE — Progress Notes (Signed)
Pt given Incentive Spirometer to take home.  Pt to use IS every 30 minutes over the next 24 hours.  Verbalizes understanding.

## 2015-11-12 NOTE — Transfer of Care (Signed)
Immediate Anesthesia Transfer of Care Note  Patient: Jeffrey Gutierrez  Procedure(s) Performed: Procedure(s): HERNIA REPAIR INGUINAL ADULT WITH MESH (Left) INSERTION OF MESH (Left)  Patient Location: PACU  Anesthesia Type:General  Level of Consciousness: awake, alert  and oriented  Airway & Oxygen Therapy: Patient Spontanous Breathing and Patient connected to face mask oxygen  Post-op Assessment: Report given to RN  Post vital signs: Reviewed and stable  Last Vitals:  Filed Vitals:   11/12/15 0815 11/12/15 0820  BP: 126/76 123/79  Pulse:    Temp:    Resp: 15 18    Complications: No apparent anesthesia complications

## 2015-11-15 ENCOUNTER — Encounter (HOSPITAL_COMMUNITY): Payer: Self-pay | Admitting: General Surgery

## 2015-11-23 ENCOUNTER — Ambulatory Visit (INDEPENDENT_AMBULATORY_CARE_PROVIDER_SITE_OTHER): Payer: 59 | Admitting: Internal Medicine

## 2015-11-23 ENCOUNTER — Encounter: Payer: Self-pay | Admitting: Internal Medicine

## 2015-11-23 VITALS — BP 160/83 | HR 64 | Temp 98.4°F | Ht 74.0 in | Wt 163.4 lb

## 2015-11-23 DIAGNOSIS — K7460 Unspecified cirrhosis of liver: Secondary | ICD-10-CM

## 2015-11-23 DIAGNOSIS — K219 Gastro-esophageal reflux disease without esophagitis: Secondary | ICD-10-CM

## 2015-11-23 NOTE — Patient Instructions (Signed)
Continue Nexium daily  Continue Miralax twice daily as needed for constipation  Begin Ocaliva when you get it (let us know when you receive it)  Office visit with LFTs in 6 months

## 2015-11-23 NOTE — Progress Notes (Signed)
Primary Care Physician:  Purvis Kilts, MD Primary Gastroenterologist:  Dr. Gala Romney  Pre-Procedure History & Physical: HPI:  Jeffrey Gutierrez is a 60 y.o. male here for follow constipation and GERD and primary biliary cholangitis. He has not yet received [place] drug company. Nexium 40 mg daily controls reflux very well. Just underwent repair of left inguinal hernia by Dr. Arnoldo Morale. He was seen this morning and a small amount of old blood was drained out according to the patient. His bowels moving once daily to twice daily on MiraLax to. At a twice daily. Linzess and Amitiza  did not agree with him.  Past Medical History  Diagnosis Date  . 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 S/P colonoscopy 2009    Pancolonic diverticula, hyperplastic rectal polyps  . Hyperplastic rectal polyp   . Diverticulitis   . COPD (chronic obstructive pulmonary disease) ([place])   . Essential hypertension   . GERD (gastroesophageal reflux disease)   . Hiatal hernia     Past Surgical History  Procedure Laterality Date  . Cholecystectomy    . Liver biopsy    . Nasal septum surgery    . Arm surgery Left     ulnar nerve release  . Colonoscopy  2009    Pancolonic diverticula, hyperplastic rectal polyps  . Colonoscopy [name] 03/24/2015    RMR: Single colonic polyp removed as described above. Pancolonic diverticulosis. Abnormal sigmoid colon consistent with stigmata of recent diverticulitis.   . Esophagogastroduodenoscopy [name] 06/28/2015    RMR: mild erosive reflux/small HH  . Inguinal hernia repair Right Feb 2012    Dr. Arnoldo Morale   . Inguinal hernia repair Left 11/12/2015    Procedure: HERNIA REPAIR INGUINAL ADULT WITH MESH;  Surgeon: Aviva Signs, MD;  Location: [facility];  Service: General;  Laterality: Left;  . Insertion of mesh Left 11/12/2015    Procedure: INSERTION OF MESH;  Surgeon: Aviva Signs, MD;  Location: [facility];  Service: General;  Laterality: Left;    Prior to Admission  medications   Medication Sig Start Date End Date Taking? Authorizing Provider  acetaminophen (TYLENOL) 500 MG tablet Take 500 mg by mouth every 6 (six) hours as needed for moderate pain or headache.   Yes Historical Provider, MD  albuterol (PROVENTIL HFA;VENTOLIN HFA) 108 (90 Base) MCG/ACT inhaler Inhale 2 puffs into the lungs every 4 (four) hours as needed for wheezing or shortness of breath. 09/17/15  Yes Merrily Pew, MD  budesonide-formoterol [facility]) 160-4.5 MCG/ACT inhaler Inhale 2 puffs into the lungs 2 (two) times daily.   Yes Historical Provider, MD  esomeprazole (NEXIUM) 40 MG capsule Take 1 capsule (40 mg total) by mouth daily at 12 noon. 09/27/15  Yes Mahala Menghini, PA-C  furosemide (LASIX) 20 MG tablet Take 20 mg by mouth daily as needed for fluid.  10/22/15  Yes Historical Provider, MD  ibuprofen (ADVIL,MOTRIN) 200 MG tablet Take 200 mg by mouth every 6 (six) hours as needed for mild pain or moderate pain.   Yes Historical Provider, MD  lisinopril-hydrochlorothiazide (PRINZIDE,ZESTORETIC) 20-12.5 MG tablet Take 1 tablet by mouth daily as needed (high blood pressure).  06/23/15  Yes Historical Provider, MD  Multiple Vitamin (MULTIVITAMIN WITH MINERALS) TABS tablet Take 1 tablet by mouth daily.   Yes Historical Provider, MD  polyethylene glycol (MIRALAX / GLYCOLAX) packet Take 34 g by mouth 2 (two) times daily.    Yes Historical Provider, MD  tiotropium (SPIRIVA) 18 MCG inhalation  capsule Place 18 mcg into inhaler and inhale daily.   Yes Historical Provider, MD  traMADol (ULTRAM) 50 MG tablet Take 1 tablet (50 mg total) by mouth every 6 (six) hours as needed. 11/12/15  Yes Aviva Signs, MD  amoxicillin-clavulanate (AUGMENTIN) 875-125 MG tablet Take 1 tablet by mouth 2 (two) times daily. Reported on 11/23/2015 11/04/15   Historical Provider, MD  CHERATUSSIN AC 100-10 MG/5ML syrup Take 5 mLs by mouth every 6 (six) hours as needed for cough. Reported on 11/23/2015 11/04/15   Historical Provider, MD     Allergies as of 11/23/2015 - Review Complete 11/23/2015  Allergen Reaction Noted  . Dihydrocodeine Anaphylaxis 09/17/2015  . Hydrocodone Anaphylaxis and Other (See Comments)   . Linzess [linaclotide] Diarrhea 07/26/2015    Family History  Problem Relation Age of Onset  . Pneumonia Father   . Lung cancer Mother   . Lung cancer Father   . Pancreatitis Father     Social History   Social History  . Marital Status: Married    Spouse Name: [name]  . Number of Children: [name]  . Years of Education: [name]   Occupational History  . Not on file.   Social History Main Topics  . Smoking status: Current Some Day Smoker -- 0.50 packs/day for 43 years    Types: Cigarettes  . Smokeless tobacco: Never Used  . Alcohol Use: 0.0 oz/week    0 Standard drinks or equivalent per week     Comment: occassional  . Drug Use: No  . Sexual Activity:    Partners: Male   Other Topics Concern  . Not on file   Social History Narrative    Review of Systems: See HPI, otherwise negative ROS  Physical Exam: BP 160/83 mmHg  Pulse 64  Temp(Src) 98.4 F (36.9 C) (Oral)  Ht '6\' 2"'$  (1.88 m)  Wt 163 lb 6.4 oz (74.118 kg)  BMI 20.97 kg/m2 General:   Alert,  Well-developed, well-nourished, pleasant and cooperative in NAD Neck:  Supple; no masses or thyromegaly. No significant cervical adenopathy. Lungs:  Clear throughout to auscultation.   No wheezes, crackles, or rhonchi. No acute distress. Heart:  Regular rate and rhythm; no murmurs, clicks, rubs,  or gallops. Abdomen: Non-distended, normal bowel sounds.  Soft and nontender without appreciable mass or hepatosplenomegaly.  Left inguinal area with bruising and a fresh surgical site appears to be healing well.   Impression:    Pleasant 60 year old gentleman with GERD and primary biliary cholangitis doing well at this point in time. Has not yet received his Ocaliva for [name]. Constipation managed fairly well with MiraLAX twice daily.  Recent left inguinal  hernia repair.  Recommendations:  Continue Nexium daily  Continue Miralax twice daily as needed for constipation  Patient to begin Alvino Blood when she gets it (He is to let us know when he gets it)  Office visit with LFTs in 6 months    Notice: This dictation was prepared with Dragon dictation along with smaller phrase technology. Any transcriptional errors that result from this process are unintentional and may not be corrected upon review.

## 2015-11-29 ENCOUNTER — Telehealth: Payer: Self-pay

## 2015-11-29 NOTE — Telephone Encounter (Signed)
Finally able to speak with someone at pts insurance co about ocaliva. It was approved in Feb but they had already cancelled his enrollment in the mail order specialty pharmacy. Pt would have to call [phone] (I gave him this number when he was in the office) to get re-enrolled in the program. His copay is $60 a month. I have emailed the [employer] and asked them if they have a copay card. Still waiting to hear from them. I have called the pt and he is aware and will call to get it delivered, he said this copay was cheaper than the [place] was. He said he didn't have a problem with it but I told him that I would call him when I heard from the company.

## 2015-12-02 NOTE — Telephone Encounter (Signed)
Found some paperwork thru the [place] website for [employer] which will help the pt get copay assistance with the medication. I have mailed him a copy of the paperwork with a letter.

## 2015-12-14 ENCOUNTER — Ambulatory Visit (INDEPENDENT_AMBULATORY_CARE_PROVIDER_SITE_OTHER): Payer: 59 | Admitting: Internal Medicine

## 2015-12-14 ENCOUNTER — Encounter: Payer: Self-pay | Admitting: Internal Medicine

## 2015-12-14 ENCOUNTER — Telehealth: Payer: Self-pay

## 2015-12-14 VITALS — BP 130/86 | HR 79 | Temp 98.2°F | Ht 73.0 in | Wt 161.6 lb

## 2015-12-14 DIAGNOSIS — R109 Unspecified abdominal pain: Secondary | ICD-10-CM

## 2015-12-14 NOTE — Progress Notes (Signed)
Primary Care Physician:  Purvis Kilts, MD Primary Gastroenterologist:  Dr. Gala Romney  Pre-Procedure History & Physical: HPI:  Jeffrey Gutierrez is a 60 y.o. male here for evaluation for right-sided abdominal pain. The patient has had an off-and-on history of right-sided abdominal pain for several months. States he hs early satiety -  doesn't want to eat much;  bowel function has diminished. No melena rectal bleeding. States he's probably lost a good 40 pounds since  2013; he did weight 201 pounds and now is in the 161 pounds range. No nausea or vomiting or fever. Recent EGD revealed only mild reflux esophagitis.  Patient reports being on an OTC agent for reflux and more recently.   CT in  February of this year demonstrated a left inguinal hernia which was felt to be symptomatic. It was repaired by Dr. Arnoldo Morale. Apparently, he is healing nicely from surgery without apparent complication.Marland Kitchen He was seen by PCP last week and was started on antibiotics for  presumed diverticulitis. He reports taking antibiotics for a couple days and then stop them do to not being able to tell much difference.   He does not have any urinary tract symptoms. History of primary biliary cholangitis ;  Metavir F3 F4 scale He is now taking [place] for PBC which he received in about 2 weeks ago in the mail. He is tolerating it well. 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 S/P colonoscopy 2009    Pancolonic diverticula, hyperplastic rectal polyps  . Hyperplastic rectal polyp   . Diverticulitis   . COPD (chronic obstructive pulmonary disease) ([place])   . Essential hypertension   . GERD (gastroesophageal reflux disease)   . Hiatal hernia     Past Surgical History  Procedure Laterality Date  . Cholecystectomy    . Liver biopsy     . Nasal septum surgery    . Arm surgery Left     ulnar nerve release  . Colonoscopy  2009    Pancolonic diverticula, hyperplastic rectal polyps  . Colonoscopy [name] 03/24/2015    RMR: Single colonic polyp removed as described above. Pancolonic diverticulosis. Abnormal sigmoid colon consistent with stigmata of recent diverticulitis.   . Esophagogastroduodenoscopy [name] 06/28/2015    RMR: mild erosive reflux/small HH  . Inguinal hernia repair Right Feb 2012    Dr. Arnoldo Morale   . Inguinal hernia repair Left 11/12/2015    Procedure: HERNIA REPAIR INGUINAL ADULT WITH MESH;  Surgeon: Aviva Signs, MD;  Location: [facility];  Service: General;  Laterality: Left;  . Insertion of mesh Left 11/12/2015    Procedure: INSERTION OF MESH;  Surgeon: Aviva Signs, MD;  Location: [facility];  Service: General;  Laterality: Left;    Prior to Admission medications   Medication Sig Start Date End Date Taking? Authorizing Provider  acetaminophen (TYLENOL) 500 MG tablet Take 500 mg by mouth every 6 (six) hours as needed for moderate pain or headache.   Yes Historical Provider, MD  albuterol (PROVENTIL HFA;VENTOLIN HFA) 108 (90 Base) MCG/ACT inhaler Inhale 2 puffs into the lungs every 4 (four) hours as needed for wheezing or shortness of breath. 09/17/15  Yes Merrily Pew, MD  AMITIZA 24 MCG capsule  11/03/15  Yes Historical Provider, MD  budesonide-formoterol (SYMBICORT) 160-4.5 MCG/ACT inhaler Inhale 2 puffs into the  lungs 2 (two) times daily.   Yes Historical Provider, MD  CHERATUSSIN AC 100-10 MG/5ML syrup Take 5 mLs by mouth every 6 (six) hours as needed for cough. Reported on 11/23/2015 11/04/15  Yes Historical Provider, MD  esomeprazole (NEXIUM) 40 MG capsule Take 1 capsule (40 mg total) by mouth daily at 12 noon. 09/27/15  Yes Mahala Menghini, PA-C  furosemide (LASIX) 20 MG tablet Take 20 mg by mouth daily as needed for fluid.  10/22/15  Yes Historical Provider, MD  lisinopril-hydrochlorothiazide (PRINZIDE,ZESTORETIC) 20-12.5 MG  tablet Take 1 tablet by mouth daily as needed (high blood pressure).  06/23/15  Yes Historical Provider, MD  Multiple Vitamin (MULTIVITAMIN WITH MINERALS) TABS tablet Take 1 tablet by mouth daily.   Yes Historical Provider, MD  OCALIVA 5 MG TABS  11/29/15  Yes Historical Provider, MD  polyethylene glycol (MIRALAX / GLYCOLAX) packet Take 34 g by mouth 2 (two) times daily.    Yes Historical Provider, MD  tiotropium (SPIRIVA) 18 MCG inhalation capsule Place 18 mcg into inhaler and inhale daily.   Yes Historical Provider, MD  traMADol (ULTRAM) 50 MG tablet Take 1 tablet (50 mg total) by mouth every 6 (six) hours as needed. 11/12/15  Yes Aviva Signs, MD  amoxicillin-clavulanate (AUGMENTIN) 875-125 MG tablet Take 1 tablet by mouth 2 (two) times daily. Reported on 12/14/2015 11/04/15   Historical Provider, MD  ciprofloxacin (CIPRO) 500 MG tablet Take 500 mg by mouth 2 (two) times daily. Reported on 12/14/2015 12/07/15   Historical Provider, MD  ibuprofen (ADVIL,MOTRIN) 200 MG tablet Take 200 mg by mouth every 6 (six) hours as needed for mild pain or moderate pain. Reported on 12/14/2015    Historical Provider, MD  metroNIDAZOLE (FLAGYL) 250 MG tablet Take 250 mg by mouth 3 (three) times daily. Reported on 12/14/2015 12/07/15   Historical Provider, MD    Allergies as of 12/14/2015 - Review Complete 12/14/2015  Allergen Reaction Noted  . Dihydrocodeine Anaphylaxis 09/17/2015  . Hydrocodone Anaphylaxis and Other (See Comments)   . Linzess [linaclotide] Diarrhea 07/26/2015    Family History  Problem Relation Age of Onset  . Pneumonia Father   . Lung cancer Mother   . Lung cancer Father   . Pancreatitis Father     Social History   Social History  . Marital Status: Married    Spouse Name: [name]  . Number of Children: [name]  . Years of Education: [name]   Occupational History  . Not on file.   Social History Main Topics  . Smoking status: Current Some Day Smoker -- 0.50 packs/day for 43 years    Types:  Cigarettes  . Smokeless tobacco: Never Used  . Alcohol Use: 0.0 oz/week    0 Standard drinks or equivalent per week     Comment: occassional  . Drug Use: No  . Sexual Activity:    Partners: Male   Other Topics Concern  . Not on file   Social History Narrative    Review of Systems: See HPI, otherwise negative ROS  Physical Exam: BP 130/86 mmHg  Pulse 79  Temp(Src) 98.2 F (36.8 C)  Ht '6\' 1"'$  (1.854 m)  Wt 161 lb 9.6 oz (73.301 kg)  BMI 21.33 kg/m2 General:   Alert,  Well-developed, well-nourished, pleasant and cooperative in NAD Skin:  Intact without significant lesions or rashes. Eyes:  Sclera clear, no icterus.   Conjunctiva pink. Ears:  Normal auditory acuity. Nose:  No deformity, discharge,  or lesions. Mouth:  No  deformity or lesions. Neck:  Supple; no masses or thyromegaly. No significant cervical adenopathy. Lungs:  Clear throughout to auscultation.   No wheezes, crackles, or rhonchi. No acute distress. Heart:  Regular rate and rhythm; no murmurs, clicks, rubs,  or gallops. Abdomen: Non-distended, normal bowel sounds.  Soft and nontender without appreciable mass or hepatosplenomegaly.  Pulses:  Normal pulses noted. Extremities:  Without clubbing or edema.  Impression:  60 year old male with long-standing abdominal pain along with a vague right sided abdominal component. Gallbladder is out. He likely has early cirrhosis related to [place]. He is now complains of early satiety; no nausea or vomiting. Reflux symptoms with medications taking at home - fairly well controlled.. Recent left inguinal hernia repair. I'm unsure whether or not interposition of the right colon between liver and abdominal wall would be producing any symptoms or not at this time. He needs to have updated labs. Findings of recent colonoscopy reviewed.   Recommendations;  Continue Octaliva  Patient is to call us let us know which acid suppressing agent he is taking.  CBC, Chem-12, serum lipase  today  Further recommendations to follow  Additional tests may be needed   Notice: This dictation was prepared with Dragon dictation along with smaller phrase technology. Any transcriptional errors that result from this process are unintentional and may not be corrected upon review.

## 2015-12-14 NOTE — Telephone Encounter (Signed)
I would like him to immediately increase the Nexium to 40 mg daily. Thanks.

## 2015-12-14 NOTE — Patient Instructions (Signed)
CBC, Chem-12, serum lipase today  Further recommendations to follow  Additional tests may be needed

## 2015-12-14 NOTE — Telephone Encounter (Signed)
Pt called back to say he takes the OTC Nexium 22.3 mg once daily.  I have corrected on the med list.

## 2015-12-15 LAB — CBC WITH DIFFERENTIAL/PLATELET
BASOS ABS: 0 10*3/uL (ref 0.0–0.1)
BASOS PCT: 0 % (ref 0–1)
EOS ABS: 0 10*3/uL (ref 0.0–0.7)
Eosinophils Relative: 1 % (ref 0–5)
HCT: 44.9 % (ref 39.0–52.0)
HEMOGLOBIN: 15.3 g/dL (ref 13.0–17.0)
Lymphocytes Relative: 36 % (ref 12–46)
Lymphs Abs: 1.7 10*3/uL (ref 0.7–4.0)
MCH: 28.9 pg (ref 26.0–34.0)
MCHC: 34.1 g/dL (ref 30.0–36.0)
MCV: 84.7 fL (ref 78.0–100.0)
MPV: 8.8 fL (ref 8.6–12.4)
Monocytes Absolute: 0.7 10*3/uL (ref 0.1–1.0)
Monocytes Relative: 14 % — ABNORMAL HIGH (ref 3–12)
NEUTROS ABS: 2.3 10*3/uL (ref 1.7–7.7)
NEUTROS PCT: 49 % (ref 43–77)
PLATELETS: 223 10*3/uL (ref 150–400)
RBC: 5.3 MIL/uL (ref 4.22–5.81)
RDW: 14.4 % (ref 11.5–15.5)
WBC: 4.7 10*3/uL (ref 4.0–10.5)

## 2015-12-15 LAB — COMPREHENSIVE METABOLIC PANEL
ALK PHOS: 112 U/L (ref 40–115)
ALT: 15 U/L (ref 9–46)
AST: 21 U/L (ref 10–35)
Albumin: 3.6 g/dL (ref 3.6–5.1)
BILIRUBIN TOTAL: 0.6 mg/dL (ref 0.2–1.2)
BUN: 8 mg/dL (ref 7–25)
CO2: 30 mmol/L (ref 20–31)
Calcium: 9.4 mg/dL (ref 8.6–10.3)
Chloride: 95 mmol/L — ABNORMAL LOW (ref 98–110)
Creat: 0.78 mg/dL (ref 0.70–1.33)
GLUCOSE: 108 mg/dL — AB (ref 65–99)
POTASSIUM: 4.7 mmol/L (ref 3.5–5.3)
SODIUM: 134 mmol/L — AB (ref 135–146)
TOTAL PROTEIN: 7.7 g/dL (ref 6.1–8.1)

## 2015-12-15 LAB — LIPASE: Lipase: 19 U/L (ref 7–60)

## 2015-12-16 NOTE — Telephone Encounter (Signed)
Tried to call pt- NA 

## 2015-12-19 ENCOUNTER — Emergency Department (HOSPITAL_COMMUNITY): Payer: 59

## 2015-12-19 ENCOUNTER — Emergency Department (HOSPITAL_COMMUNITY)
Admission: EM | Admit: 2015-12-19 | Discharge: 2015-12-19 | Disposition: A | Discharge status: Home / Self Care | Payer: 59 | Attending: Emergency Medicine | Admitting: Emergency Medicine

## 2015-12-19 ENCOUNTER — Encounter (HOSPITAL_COMMUNITY): Payer: Self-pay

## 2015-12-19 DIAGNOSIS — F17210 Nicotine dependence, cigarettes, uncomplicated: Secondary | ICD-10-CM | POA: Diagnosis not present

## 2015-12-19 DIAGNOSIS — J449 Chronic obstructive pulmonary disease, unspecified: Secondary | ICD-10-CM | POA: Diagnosis not present

## 2015-12-19 DIAGNOSIS — Z79899 Other long term (current) drug therapy: Secondary | ICD-10-CM | POA: Insufficient documentation

## 2015-12-19 DIAGNOSIS — I10 Essential (primary) hypertension: Secondary | ICD-10-CM | POA: Insufficient documentation

## 2015-12-19 DIAGNOSIS — Z791 Long term (current) use of non-steroidal anti-inflammatories (NSAID): Secondary | ICD-10-CM | POA: Diagnosis not present

## 2015-12-19 DIAGNOSIS — Z7901 Long term (current) use of anticoagulants: Secondary | ICD-10-CM | POA: Insufficient documentation

## 2015-12-19 DIAGNOSIS — R109 Unspecified abdominal pain: Secondary | ICD-10-CM | POA: Insufficient documentation

## 2015-12-19 DIAGNOSIS — G8929 Other chronic pain: Secondary | ICD-10-CM

## 2015-12-19 LAB — COMPREHENSIVE METABOLIC PANEL
ALBUMIN: 3.4 g/dL — AB (ref 3.5–5.0)
ALK PHOS: 97 U/L (ref 38–126)
ALT: 15 U/L — AB (ref 17–63)
AST: 23 U/L (ref 15–41)
Anion gap: 6 (ref 5–15)
BUN: 7 mg/dL (ref 6–20)
CALCIUM: 8.6 mg/dL — AB (ref 8.9–10.3)
CO2: 28 mmol/L (ref 22–32)
CREATININE: 0.71 mg/dL (ref 0.61–1.24)
Chloride: 100 mmol/L — ABNORMAL LOW (ref 101–111)
GFR calc Af Amer: >60 mL/min (ref >60)
GFR calc non Af Amer: >60 mL/min (ref >60)
GLUCOSE: 105 mg/dL — AB (ref 65–99)
Potassium: 4.4 mmol/L (ref 3.5–5.1)
SODIUM: 134 mmol/L — AB (ref 135–145)
Total Bilirubin: 0.4 mg/dL (ref 0.3–1.2)
Total Protein: 7.8 g/dL (ref 6.5–8.1)

## 2015-12-19 LAB — CBC WITH DIFFERENTIAL/PLATELET
Basophils Absolute: 0 10*3/uL (ref 0.0–0.1)
Basophils Relative: 0 %
EOS ABS: 0 10*3/uL (ref 0.0–0.7)
EOS PCT: 0 %
HCT: 42.8 % (ref 39.0–52.0)
HEMOGLOBIN: 14.5 g/dL (ref 13.0–17.0)
LYMPHS ABS: 1.7 10*3/uL (ref 0.7–4.0)
Lymphocytes Relative: 35 %
MCH: 29.6 pg (ref 26.0–34.0)
MCHC: 33.9 g/dL (ref 30.0–36.0)
MCV: 87.3 fL (ref 78.0–100.0)
MONOS PCT: 9 %
Monocytes Absolute: 0.4 10*3/uL (ref 0.1–1.0)
NEUTROS PCT: 56 %
Neutro Abs: 2.7 10*3/uL (ref 1.7–7.7)
Platelets: 182 10*3/uL (ref 150–400)
RBC: 4.9 MIL/uL (ref 4.22–5.81)
RDW: 13.8 % (ref 11.5–15.5)
WBC: 4.9 10*3/uL (ref 4.0–10.5)

## 2015-12-19 LAB — LIPASE, BLOOD: Lipase: 34 U/L (ref 11–51)

## 2015-12-19 LAB — TROPONIN I

## 2015-12-19 MED ORDER — IOHEXOL 300 MG/ML  SOLN
100.0000 mL | Freq: Once | INTRAMUSCULAR | Status: AC | PRN
  Administered 2015-12-19: 100 mL via INTRAVENOUS

## 2015-12-19 MED ORDER — IOHEXOL 300 MG/ML  SOLN
25.0000 mL | Freq: Once | INTRAMUSCULAR | Status: AC | PRN
  Administered 2015-12-19: 25 mL via ORAL

## 2015-12-19 MED ORDER — HYDROMORPHONE HCL 2 MG PO TABS: 2.0000 mg | ORAL_TABLET | Freq: Four times a day (QID) | ORAL | Status: DC | PRN

## 2015-12-19 NOTE — ED Notes (Signed)
Pt c/o abd cramping that started yesterday and then started having pain in r side.  Pt says felt like had indigestion after eating last night.  Pt says had a small bm this morning.  Pt presently c/o generalized abd pain.  Denies SOB at this time.  Denies n/v.

## 2015-12-19 NOTE — ED Provider Notes (Signed)
CSN: [id number]     Arrival date & time 12/19/15  5188 History  By signing my name below, I, Stephania Fragmin, attest that this documentation has been prepared under the direction and in the presence of Davonna Belling, MD. Electronically Signed: Stephania Fragmin, ED Scribe. 12/19/2015. 11:09 AM.    Chief Complaint  Patient presents with  . Abdominal Pain   The history is provided by the patient. No language interpreter was used.    HPI Comments: PRESTIN MUNCH is a 60 y.o. male with a history of PBC (controlled with medications), COPD, diverticulitis, hiatal hernia, and hernia repair 6 weeks ago, who presents to the Emergency Department complaining of a recurrent episode of cramping, 8/10, right-sided abdominal pain that began yesterday, although patient states he has been having intermittent episodes of the same since July 2016. He states eating does not exacerbate his pain but seems to cause a "tight" sensation through his entire abdomen; consequently, he states he has not been able to eat as much despite maintaining normal appetite, and has lost 43 pounds since July. Patient states he has had multiple CT scans over the past year. He states he saw Dr. Gala Romney 1 week ago for the same. He denies any pain associated with his PBC, which he states is controlled with medications. He denies a history of any cardiac conditions, including CAD or MI. He denies nausea, vomiting, fever, hematochezia, or melena.   Past Medical History  Diagnosis Date  . 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 S/P colonoscopy 2009    Pancolonic diverticula, hyperplastic rectal polyps  . Hyperplastic rectal polyp   . Diverticulitis   . COPD (chronic obstructive pulmonary disease) ([place])   . Essential hypertension   . GERD (gastroesophageal reflux disease)   . Hiatal hernia    Past Surgical History  Procedure Laterality Date  . Cholecystectomy    . Liver biopsy    . Nasal septum surgery    . Arm surgery Left      ulnar nerve release  . Colonoscopy  2009    Pancolonic diverticula, hyperplastic rectal polyps  . Colonoscopy [name] 03/24/2015    RMR: Single colonic polyp removed as described above. Pancolonic diverticulosis. Abnormal sigmoid colon consistent with stigmata of recent diverticulitis.   . Esophagogastroduodenoscopy [name] 06/28/2015    RMR: mild erosive reflux/small HH  . Inguinal hernia repair Right Feb 2012    Dr. Arnoldo Morale   . Inguinal hernia repair Left 11/12/2015    Procedure: HERNIA REPAIR INGUINAL ADULT WITH MESH;  Surgeon: Aviva Signs, MD;  Location: [facility];  Service: General;  Laterality: Left;  . Insertion of mesh Left 11/12/2015    Procedure: INSERTION OF MESH;  Surgeon: Aviva Signs, MD;  Location: [facility];  Service: General;  Laterality: Left;   Family History  Problem Relation Age of Onset  . Pneumonia Father   . Lung cancer Mother   . Lung cancer Father   . Pancreatitis Father    Social History  Substance Use Topics  . Smoking status: Current Some Day Smoker -- 0.50 packs/day for 43 years    Types: Cigarettes  . Smokeless tobacco: Never Used  . Alcohol Use: 0.0 oz/week    0 Standard drinks or equivalent per week     Comment: occassional    Review of Systems  Constitutional: Positive for unexpected weight change (43 pounds since July 2016). Negative for fever and appetite change.  Gastrointestinal: Positive for abdominal pain.  Negative for nausea, vomiting and blood in stool.  All other systems reviewed and are negative.     Allergies  Dihydrocodeine; Hydrocodone; and Linzess  Home Medications   Prior to Admission medications   Medication Sig Start Date End Date Taking? Authorizing Provider  acetaminophen (TYLENOL) 500 MG tablet Take 500 mg by mouth every 6 (six) hours as needed for moderate pain or headache.   Yes Historical Provider, MD  albuterol (PROVENTIL HFA;VENTOLIN HFA) 108 (90 Base) MCG/ACT inhaler Inhale 2 puffs into the lungs every 4 (four) hours as  needed for wheezing or shortness of breath. 09/17/15  Yes Merrily Pew, MD  budesonide-formoterol [facility]) 160-4.5 MCG/ACT inhaler Inhale 2 puffs into the lungs 2 (two) times daily.   Yes Historical Provider, MD  furosemide (LASIX) 20 MG tablet Take 20 mg by mouth daily as needed for fluid.  10/22/15  Yes Historical Provider, MD  ibuprofen (ADVIL,MOTRIN) 200 MG tablet Take 200 mg by mouth every 6 (six) hours as needed for mild pain or moderate pain. Reported on 12/14/2015   Yes Historical Provider, MD  lisinopril-hydrochlorothiazide (PRINZIDE,ZESTORETIC) 20-12.5 MG tablet Take 1 tablet by mouth daily as needed (high blood pressure).  06/23/15  Yes Historical Provider, MD  Multiple Vitamin (MULTIVITAMIN WITH MINERALS) TABS tablet Take 1 tablet by mouth daily.   Yes Historical Provider, MD  NON FORMULARY Nexium OTC  22.3 mg  Once daily   Yes Historical Provider, MD  OCALIVA 5 MG TABS Take 1 tablet by mouth daily.  11/29/15  Yes Historical Provider, MD  polyethylene glycol (MIRALAX / GLYCOLAX) packet Take 34 g by mouth 2 (two) times daily.    Yes Historical Provider, MD  tiotropium (SPIRIVA) 18 MCG inhalation capsule Place 18 mcg into inhaler and inhale daily.   Yes Historical Provider, MD  HYDROmorphone (DILAUDID) 2 MG tablet Take 1 tablet (2 mg total) by mouth every 6 (six) hours as needed for severe pain. 12/19/15   Davonna Belling, MD  traMADol (ULTRAM) 50 MG tablet Take 1 tablet (50 mg total) by mouth every 6 (six) hours as needed. Patient not taking: Reported on 12/19/2015 11/12/15   Aviva Signs, MD   BP 104/68 mmHg  Pulse 78  Temp(Src) 98.7 F (37.1 C) (Oral)  Resp 14  Ht '6\' 1"'$  (1.854 m)  Wt 161 lb 8 oz (73.256 kg)  BMI 21.31 kg/m2  SpO2 90% Physical Exam  Constitutional: He is oriented to person, place, and time. He appears well-developed and well-nourished. No distress.  HENT:  Head: Normocephalic and atraumatic.  Eyes: Conjunctivae and EOM are normal.  Neck: Neck supple. No tracheal  deviation present.  Cardiovascular: Normal rate.   Pulmonary/Chest: Effort normal. No respiratory distress.  Abdominal:  Mild right mid abdominal pain. No rebound guarding or mass. Slight left inguinal tenderness.  Musculoskeletal: Normal range of motion.  Neurological: He is alert and oriented to person, place, and time.  Skin: Skin is warm and dry.  Psychiatric: He has a normal mood and affect. His behavior is normal.  Nursing note and vitals reviewed.   ED Course  Procedures (including critical care time)  DIAGNOSTIC STUDIES: Oxygen Saturation is 100% on RA, normal by my interpretation.    COORDINATION OF CARE: 10:53 AM - Unremarkable CBC and CMP results discussed with pt. Discussed treatment plan with pt at bedside which includes blood tests and CT abdomen. Pt verbalized understanding and agreed to plan.  Labs Review Labs Reviewed  COMPREHENSIVE METABOLIC PANEL - Abnormal; Notable for the following:  Sodium 134 (*)    Chloride 100 (*)    Glucose, Bld 105 (*)    Calcium 8.6 (*)    Albumin 3.4 (*)    ALT 15 (*)    All other components within normal limits  LIPASE, BLOOD  CBC WITH DIFFERENTIAL/PLATELET  TROPONIN I    Imaging Review Ct Abdomen Pelvis W Contrast  12/19/2015  CLINICAL DATA:  60 year old male with right abdominal pelvic pain for 7 months. History of primary biliary cirrhosis. EXAM: CT ABDOMEN AND PELVIS WITH CONTRAST TECHNIQUE: Multidetector CT imaging of the abdomen and pelvis was performed using the standard protocol following bolus administration of intravenous contrast. CONTRAST:  148m OMNIPAQUE IOHEXOL 300 MG/ML  SOLN COMPARISON:  11/01/2015 and prior exams FINDINGS: Lower chest:  No acute abnormalities. Hepatobiliary: Liver is unchanged. No suspicious focal hepatic lesions identified. Patient is status post cholecystectomy. There is no evidence biliary dilatation. Pancreas: Unremarkable Spleen: Splenomegaly again noted and unchanged. No focal splenic  lesions present. Adrenals/Urinary Tract: The kidneys, adrenal glands and bladder are unremarkable except for unchanged probable tiny bilateral renal cysts. Stomach/Bowel: There is no evidence of bowel obstruction or definite bowel wall thickening. Vascular/Lymphatic: Aortic atherosclerotic calcifications noted without aneurysm. No enlarged lymph nodes identified. Reproductive: Prostate enlargement again noted. Other: No free fluid, abscess or pneumoperitoneum. Musculoskeletal: No acute or suspicious abnormalities. IMPRESSION: No acute abnormalities. Unchanged appearance of the liver without suspicious focal abnormalities. Unchanged splenomegaly. Aortic atherosclerosis. Electronically Signed   By: JMargarette CanadaM.D.   On: 12/19/2015 12:33   I have personally reviewed and evaluated these images and lab results as part of my medical decision-making.   EKG Interpretation   Date/Time:  Sunday December 19 2015 10:07:00 EDT Ventricular Rate:  81 PR Interval:  149 QRS Duration: 106 QT Interval:  383 QTC Calculation: 445 R Axis:   138 Text Interpretation:  Sinus rhythm Probable right ventricular hypertrophy  baseline wander in III Confirmed by Kandiss Ihrig  MD, Capers Hagmann ([phone] on  12/19/2015 10:10:40 AM      MDM   Final diagnoses:  Chronic abdominal pain   patient with acute on Chronic Abdominal Pain. Lab work reassuring. CT scan done since it was reportedly planned. It is reassuring. Will discharge home to follow-up with Dr. RBuford Dresseras planned  I personally performed the services described in this documentation, which was scribed in my presence. The recorded information has been reviewed and is accurate.       NDavonna Belling MD 12/19/15 [id number]

## 2015-12-19 NOTE — Discharge Instructions (Signed)

## 2015-12-20 NOTE — Telephone Encounter (Signed)
Pt has ov tomorrow with [facility]

## 2015-12-21 ENCOUNTER — Other Ambulatory Visit: Payer: Self-pay

## 2015-12-21 ENCOUNTER — Ambulatory Visit (INDEPENDENT_AMBULATORY_CARE_PROVIDER_SITE_OTHER): Payer: 59 | Admitting: Gastroenterology

## 2015-12-21 ENCOUNTER — Encounter: Payer: Self-pay | Admitting: Gastroenterology

## 2015-12-21 VITALS — BP 133/78 | HR 77 | Temp 98.3°F | Ht 73.0 in | Wt 158.6 lb

## 2015-12-21 DIAGNOSIS — R634 Abnormal weight loss: Secondary | ICD-10-CM | POA: Diagnosis not present

## 2015-12-21 DIAGNOSIS — R109 Unspecified abdominal pain: Secondary | ICD-10-CM

## 2015-12-21 DIAGNOSIS — K743 Primary biliary cirrhosis: Secondary | ICD-10-CM | POA: Diagnosis not present

## 2015-12-21 MED ORDER — ESOMEPRAZOLE MAGNESIUM 40 MG PO PACK: 40.0000 mg | PACK | Freq: Every day | ORAL | Status: DC

## 2015-12-21 NOTE — Progress Notes (Signed)
Referring Provider: Sharilyn Sites, MD Primary Care Physician:  Purvis Kilts, MD  Primary GI: Dr. Gala Romney   Chief Complaint  Patient presents with  . Abdominal Pain    HPI:   Jeffrey Gutierrez is a 60 y.o. male presenting today with a history of PBC, likely early cirrhosis, with chronic RUQ pain since July. Recently seen in our office late March and presenting again due to ED visit. Notes pain as constant. "rolls" like crazy. Has lost a lot of weight. Feels like there is gas. Early satiety. No nausea. Rare vomiting. Will eat and start coughing, then throws up. Taking Nexium OTC currently. Needs to increase to 40 mg daily. Father had history of pancreatitis. No pancreatic cancer history. Miralax BID. Some days productive, some days not. Can't take Linzess. Amitiza "doesn't do anything". Never got the Hep A or B vaccinations. Pain is unchanged after eating. Colonoscopy up-to-date as of July 2016, EGD most recently completed in Oct 2016 with mild erosive reflux esophagitis and small hiatal hernia. Lipase, CBC, CMP unrevealing. CT recently without acute abnormalities, unchanged appearance of liver.   Weights: Highest weight of 200 in July 2016 Oct 2016 in 180s Jan 2017 170s March 160s Currently 158.    Past Medical History  Diagnosis Date  . 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 S/P colonoscopy 2009    Pancolonic diverticula, hyperplastic rectal polyps  . Hyperplastic rectal polyp   . Diverticulitis   . COPD (chronic obstructive pulmonary disease) ([place])   . Essential hypertension   . GERD (gastroesophageal reflux disease)   . Hiatal hernia     Past Surgical History  Procedure Laterality Date  . Cholecystectomy    . Liver biopsy    . Nasal septum surgery    . Arm surgery Left     ulnar nerve release  . Colonoscopy  2009    Pancolonic diverticula, hyperplastic rectal polyps  . Colonoscopy [name] 03/24/2015    RMR: Single colonic polyp removed as described  above. Pancolonic diverticulosis. Abnormal sigmoid colon consistent with stigmata of recent diverticulitis.   . Esophagogastroduodenoscopy [name] 06/28/2015    RMR: mild erosive reflux/small HH  . Inguinal hernia repair Right Feb 2012    Dr. Arnoldo Morale   . Inguinal hernia repair Left 11/12/2015    Procedure: HERNIA REPAIR INGUINAL ADULT WITH MESH;  Surgeon: Aviva Signs, MD;  Location: [facility];  Service: General;  Laterality: Left;  . Insertion of mesh Left 11/12/2015    Procedure: INSERTION OF MESH;  Surgeon: Aviva Signs, MD;  Location: [facility];  Service: General;  Laterality: Left;    Current Outpatient Prescriptions  Medication Sig Dispense Refill  . acetaminophen (TYLENOL) 500 MG tablet Take 500 mg by mouth every 6 (six) hours as needed for moderate pain or headache.    . albuterol (PROVENTIL HFA;VENTOLIN HFA) 108 (90 Base) MCG/ACT inhaler Inhale 2 puffs into the lungs every 4 (four) hours as needed for wheezing or shortness of breath. 1 Inhaler 0  . budesonide-formoterol (SYMBICORT) 160-4.5 MCG/ACT inhaler Inhale 2 puffs into the lungs 2 (two) times daily.    . furosemide (LASIX) 20 MG tablet Take 20 mg by mouth daily as needed for fluid.   0  . HYDROmorphone (DILAUDID) 2 MG tablet Take 1 tablet (2 mg total) by mouth every 6 (six) hours as needed for severe pain. 8 tablet 0  . ibuprofen (ADVIL,MOTRIN) 200 MG tablet Take 200 mg by mouth every  6 (six) hours as needed for mild pain or moderate pain. Reported on 12/14/2015    . lisinopril-hydrochlorothiazide (PRINZIDE,ZESTORETIC) 20-12.5 MG tablet Take 1 tablet by mouth daily as needed (high blood pressure).     . Multiple Vitamin (MULTIVITAMIN WITH MINERALS) TABS tablet Take 1 tablet by mouth daily.    . NON FORMULARY Nexium OTC  22.3 mg  Once daily    . OCALIVA 5 MG TABS Take 1 tablet by mouth daily.     . polyethylene glycol (MIRALAX / GLYCOLAX) packet Take 34 g by mouth 2 (two) times daily.     Marland Kitchen tiotropium (SPIRIVA) 18 MCG inhalation capsule  Place 18 mcg into inhaler and inhale daily.    . traMADol (ULTRAM) 50 MG tablet Take 1 tablet (50 mg total) by mouth every 6 (six) hours as needed. 40 tablet 0  . esomeprazole (NEXIUM) 40 MG packet Take 40 mg by mouth daily before breakfast. 30 each 12  . [DISCONTINUED] ipratropium (ATROVENT HFA) 17 MCG/ACT inhaler Inhale 2 puffs into the lungs every 6 (six) hours as needed for wheezing. (Patient not taking: Reported on 08/03/2015) 1 Inhaler 12  . [DISCONTINUED] pantoprazole (PROTONIX) 40 MG tablet Take 1 tablet (40 mg total) by mouth daily. (Patient not taking: Reported on 08/03/2015) 90 tablet 3   No current facility-administered medications for this visit.    Allergies as of 12/21/2015 - Review Complete 12/21/2015  Allergen Reaction Noted  . Dihydrocodeine Anaphylaxis 09/17/2015  . Hydrocodone Anaphylaxis and Other (See Comments)   . Linzess [linaclotide] Diarrhea 07/26/2015    Family History  Problem Relation Age of Onset  . Pneumonia Father   . Lung cancer Mother   . Lung cancer Father   . Pancreatitis Father     Social History   Social History  . Marital Status: Married    Spouse Name: [name]  . Number of Children: [name]  . Years of Education: [name]   Social History Main Topics  . Smoking status: Current Some Day Smoker -- 0.50 packs/day for 43 years    Types: Cigarettes  . Smokeless tobacco: Never Used  . Alcohol Use: 0.0 oz/week    0 Standard drinks or equivalent per week     Comment: occassional  . Drug Use: No  . Sexual Activity: Yes   Other Topics Concern  . None   Social History Narrative    Review of Systems: As mentioned in HPI.   Physical Exam: BP 133/78 mmHg  Pulse 77  Temp(Src) 98.3 F (36.8 C) (Oral)  Ht '6\' 1"'$  (1.854 m)  Wt 158 lb 9.6 oz (71.94 kg)  BMI 20.93 kg/m2 General:   Alert and oriented. No distress noted. Pleasant and cooperative.  Head:  Normocephalic and atraumatic. Eyes:  Conjuctiva clear without scleral icterus. Mouth:  Oral mucosa  pink and moist. Good dentition. No lesions. Heart:  S1, S2 present without murmurs Abdomen:  +BS, soft, TTP upper abdomen/ RUQ Msk:  Symmetrical without gross deformities. Normal posture. Extremities:  Without edema. Neurologic:  Alert and  oriented x4;  grossly normal neurologically. Psych:  Alert and cooperative. Normal mood and affect.

## 2015-12-21 NOTE — Telephone Encounter (Signed)
Anna instructed pt to increase nexium to '40mg'$ .

## 2015-12-21 NOTE — Patient Instructions (Signed)
Please increase Nexium to 40 mg once each morning, 30 minutes before breakfast. I have provided samples.   For constipation: stop Miralax. Start taking Trulance once each day, with or without food. I have provided samples.  Please have blood work done, fasting, first thing in the morning.   We have referred you to [facility] for a second opinion.  I will review the scans with the radiologist; they were unavailable to talk to at the time of your visit.

## 2015-12-22 ENCOUNTER — Encounter: Payer: Self-pay | Admitting: Gastroenterology

## 2015-12-22 DIAGNOSIS — R634 Abnormal weight loss: Secondary | ICD-10-CM | POA: Insufficient documentation

## 2015-12-22 DIAGNOSIS — K743 Primary biliary cirrhosis: Secondary | ICD-10-CM | POA: Insufficient documentation

## 2015-12-22 NOTE — Assessment & Plan Note (Signed)
Failed [place] in the past, doing well on [place]. Likely early cirrhosis. Still needs Hep A and B vaccination. EGD up-to-date for variceal screening. Next [place] due around Oct 2017.

## 2015-12-22 NOTE — Assessment & Plan Note (Signed)
Over 40 lbs lost since July 2016, coupled with chronic RUQ pain. Thus far unrevealing work-up with negative CT, normal lipase, CMP. Not exacerbated by eating. Quite concerning at this point. EGD and colonoscopy up-to-date. Doubt ischemic process but will need to review with radiologist. For now, I have asked him to increase Nexium to 40 mg daily, and we will refer him to [facility] for a second opinion. He does note constipation, and I would like to trial Trulance in the interim. This would not explain his weight loss but could possibly aid in improvement of abdominal discomfort. Due to significant weight loss, check cortisol level. He is also requesting Hep B status as well.

## 2015-12-22 NOTE — Assessment & Plan Note (Deleted)
Failed [place] in the past, doing well on [place]. Likely early cirrhosis. Still needs Hep A and B vaccination. EGD up-to-date for variceal screening. Next [place] due around Oct 2017.

## 2015-12-23 LAB — CORTISOL-AM, BLOOD: CORTISOL - AM: 25.3 ug/dL — AB

## 2015-12-23 LAB — HEPATITIS B SURFACE ANTIGEN: Hepatitis B Surface Ag: NEGATIVE

## 2015-12-23 NOTE — Progress Notes (Signed)
cc'ed to pcp °

## 2015-12-28 ENCOUNTER — Telehealth: Payer: Self-pay | Admitting: Internal Medicine

## 2015-12-28 NOTE — Telephone Encounter (Signed)
[place] IF HIS LABS ARE READY  FROM LAST WEEK

## 2015-12-28 NOTE — Telephone Encounter (Signed)
Please see result notes.  

## 2015-12-28 NOTE — Progress Notes (Signed)
Quick Note:  Hep B negative. Cortisol was mildly elevated, and I am unsure why. He needs to be evaluated at [facility] as discussed. How is Trulance working for constipation? ______

## 2015-12-28 NOTE — Telephone Encounter (Signed)
Routing to AS 

## 2016-01-05 ENCOUNTER — Other Ambulatory Visit (HOSPITAL_COMMUNITY): Payer: Self-pay | Admitting: Family Medicine

## 2016-01-05 ENCOUNTER — Ambulatory Visit (HOSPITAL_COMMUNITY)
Admission: RE | Admit: 2016-01-05 | Discharge: 2016-01-05 | Disposition: A | Discharge status: Home / Self Care | Payer: 59 | Source: Ambulatory Visit | Attending: Family Medicine | Admitting: Family Medicine

## 2016-01-05 DIAGNOSIS — I10 Essential (primary) hypertension: Secondary | ICD-10-CM | POA: Insufficient documentation

## 2016-01-05 DIAGNOSIS — M546 Pain in thoracic spine: Secondary | ICD-10-CM

## 2016-01-05 DIAGNOSIS — J449 Chronic obstructive pulmonary disease, unspecified: Secondary | ICD-10-CM | POA: Insufficient documentation

## 2016-01-05 DIAGNOSIS — F17200 Nicotine dependence, unspecified, uncomplicated: Secondary | ICD-10-CM | POA: Insufficient documentation

## 2016-01-13 ENCOUNTER — Telehealth: Payer: Self-pay | Admitting: General Practice

## 2016-01-13 NOTE — Telephone Encounter (Signed)
Patient called and wanted to know if we had heard anything from [facility] in regards to his appt he had there on 4/6.    I instructed the patient he will need to call [facility] for his results from the test that was done there.  I also reminded him of his appt here with [place] on 5/8 at 10:00 am

## 2016-01-24 ENCOUNTER — Encounter: Payer: Self-pay | Admitting: Gastroenterology

## 2016-01-24 ENCOUNTER — Ambulatory Visit (INDEPENDENT_AMBULATORY_CARE_PROVIDER_SITE_OTHER): Payer: 59 | Admitting: Gastroenterology

## 2016-01-24 VITALS — BP 128/81 | HR 86 | Temp 97.2°F | Ht 73.0 in | Wt 156.4 lb

## 2016-01-24 DIAGNOSIS — K2100 Gastro-esophageal reflux disease with esophagitis, without bleeding: Secondary | ICD-10-CM

## 2016-01-24 DIAGNOSIS — R1011 Right upper quadrant pain: Secondary | ICD-10-CM

## 2016-01-24 DIAGNOSIS — R634 Abnormal weight loss: Secondary | ICD-10-CM

## 2016-01-24 DIAGNOSIS — K743 Primary biliary cirrhosis: Secondary | ICD-10-CM

## 2016-01-24 MED ORDER — LINACLOTIDE 72 MCG PO CAPS: 72.0000 ug | ORAL_CAPSULE | Freq: Every day | ORAL | Status: DC

## 2016-01-24 MED ORDER — OMEPRAZOLE 20 MG PO CPDR: 20.0000 mg | DELAYED_RELEASE_CAPSULE | Freq: Every day | ORAL | Status: DC

## 2016-01-24 NOTE — Progress Notes (Signed)
Please let patient know that we sent in omeprazole for reflux to take place of nexium that is not covered by his insurance.

## 2016-01-24 NOTE — Assessment & Plan Note (Signed)
60 year old gentleman with history of PBC, likely early cirrhosis who has had nearly one year history of right upper quadrant pain associated with 45 pound weight loss. Symptoms constipation and early satiety. Extensive workup as outlined above. Etiology still not clear. Mesenteric Doppler study pending at [facility]. Await results. He reports pain management referral planned for possible musculoskeletal component to his pain. He had a chest CT last week with worrisome lesion, patient awaiting recommendations from PCP.  While we await pending results and recommendations, we will continue to manage constipation. Trial of low-dose Linzess 72 g daily. If he has diarrhea related to this, we could consider Movantik given his significant opioid use at this time. Current PPI, Nexium not on his formulary, will switch to omeprazole 20 mg daily. Current labs and imaging up-to-date with regards to [place], likely early cirrhosis. We will continue to follow closely pending workup.

## 2016-01-24 NOTE — Progress Notes (Signed)
cc'ed to pcp °

## 2016-01-24 NOTE — Progress Notes (Signed)
Primary Care Physician: Purvis Kilts, MD  Primary Gastroenterologist:  Garfield Cornea, MD   Chief Complaint  Patient presents with  . Follow-up    HPI: Jeffrey Gutierrez is a 60 y.o. male here for follow-up. 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  He was last seen by [place] in April 2017. Refer to [facility] for second opinion regarding weight loss and abdominal pain. He underwent 4 hour gastric emptying study which was normal. Seen in the GI clinic last week as well for follow-up. Musculoskeletal pain or costochondritis in the differential for right upper quadrant pain and pain management referral him process. He is also being scheduled for mesenteric duplex Doppler study to rule out mesenteric ischemia. PCP arrange for CT chest for unintentional weight loss, performed last week. Noted to have moderate to advanced emphysema, spiculated right upper lobe lesion measuring 10 mm, multiple additional right lung nodule several which are likely fissural lymph nodes, subcarinal adenopathy measuring 17 x 20 mm. Recent labs as outlined below.  Patient states that Trulance helped somewhat with constipation. Only took samples that were provided. Ran out of MiraLAX. Last BM on Thursday. Notes that right upper quadrant pain seems to be separate from constipation issues. BMs do not help the pain. 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 Usually this does not result in discomfort. Patient denies any significant heartburn. Nexium currently not being covered by his insurance company. He has never tried Prilosec or AcipHex. Dexilant was too expensive. Denies melena or rectal bleeding. 45 pound weight loss since 03/2015 (weight loss ongoing).   With regards  to right upper quadrant pain, radiates into the back. Constant. Unrelated to meals or bowel function. Unrelated to movement. Taking Vicodin 4-6 times daily prescribed by the PCP. Patient notes that previously he had allergy to hydrocodone with throat swelling but has been on this for now 2 months without any problems.     Current Outpatient Prescriptions  Medication Sig Dispense Refill  . acetaminophen (TYLENOL) 500 MG tablet Take 500 mg by mouth every 6 (six) hours as needed for moderate pain or headache.    . albuterol (PROVENTIL HFA;VENTOLIN HFA) 108 (90 Base) MCG/ACT inhaler Inhale 2 puffs into the lungs every 4 (four) hours as needed for wheezing or shortness of breath. 1 Inhaler 0  . budesonide-formoterol (SYMBICORT) 160-4.5 MCG/ACT inhaler Inhale 2 puffs into the lungs 2 (two) times daily.    Marland Kitchen esomeprazole (NEXIUM) 40 MG packet Take 40 mg by mouth daily before breakfast. 30 each 12  . ibuprofen (ADVIL,MOTRIN) 200 MG tablet Take 200 mg by mouth every 6 (six) hours as needed for mild pain or moderate pain. Reported on 12/14/2015    . lisinopril-hydrochlorothiazide (PRINZIDE,ZESTORETIC) 20-12.5 MG tablet Take 1 tablet by mouth daily as needed (high blood pressure).     . Multiple Vitamin (MULTIVITAMIN WITH MINERALS) TABS tablet Take 1 tablet by mouth daily.    . OCALIVA 5 MG TABS Take 1 tablet by mouth daily.     Marland Kitchen Plecanatide (TRULANCE PO) Take by mouth. Samples only    . polyethylene glycol (MIRALAX / GLYCOLAX) packet Take 34 g by mouth 2 (two) times daily.     Marland Kitchen tiotropium (SPIRIVA) 18 MCG inhalation capsule Place 18  mcg into inhaler and inhale daily.    . [DISCONTINUED] ipratropium (ATROVENT HFA) 17 MCG/ACT inhaler Inhale 2 puffs into the lungs every 6 (six) hours as needed for wheezing. (Patient not taking: Reported on 08/03/2015) 1 Inhaler 12  . [DISCONTINUED] pantoprazole (PROTONIX) 40 MG tablet Take 1 tablet (40 mg total) by mouth daily. (Patient not taking: Reported on 08/03/2015) 90  tablet 3   No current facility-administered medications for this visit.    Allergies as of 01/24/2016 - Review Complete 01/24/2016  Allergen Reaction Noted  . Dihydrocodeine Anaphylaxis 09/17/2015  . Hydrocodone Anaphylaxis and Other (See Comments)   . Linzess [linaclotide] Diarrhea 07/26/2015   Past Medical History  Diagnosis Date  . PBC (primary biliary cirrhosis) ([place])     Diagnosed 2006  . S/P colonoscopy 2009    Pancolonic diverticula, hyperplastic rectal polyps  . Hyperplastic rectal polyp   . Diverticulitis   . COPD (chronic obstructive pulmonary disease) ([place])   . Essential hypertension   . GERD (gastroesophageal reflux disease)   . Hiatal hernia    Past Surgical History  Procedure Laterality Date  . Cholecystectomy    . Liver biopsy    . Nasal septum surgery    . Arm surgery Left     ulnar nerve release  . Colonoscopy  2009    Pancolonic diverticula, hyperplastic rectal polyps  . Colonoscopy [name] 03/24/2015    RMR: Single colonic polyp removed as described above. Pancolonic diverticulosis. Abnormal sigmoid colon consistent with stigmata of recent diverticulitis.   . Esophagogastroduodenoscopy [name] 06/28/2015    RMR: mild erosive reflux/small HH  . Inguinal hernia repair Right Feb 2012    Dr. Arnoldo Morale   . Inguinal hernia repair Left 11/12/2015    Procedure: HERNIA REPAIR INGUINAL ADULT WITH MESH;  Surgeon: Aviva Signs, MD;  Location: [facility];  Service: General;  Laterality: Left;  . Insertion of mesh Left 11/12/2015    Procedure: INSERTION OF MESH;  Surgeon: Aviva Signs, MD;  Location: [facility];  Service: General;  Laterality: Left;   Family History  Problem Relation Age of Onset  . Pneumonia Father   . Lung cancer Mother   . Lung cancer Father   . Pancreatitis Father    Social History   Social History  . Marital Status: Married    Spouse Name: [name]  . Number of Children: [name]  . Years of Education: [name]   Social History Main Topics  . Smoking status: Current  Some Day Smoker -- 0.50 packs/day for 43 years    Types: Cigarettes  . Smokeless tobacco: Never Used  . Alcohol Use: 0.0 oz/week    0 Standard drinks or equivalent per week     Comment: occassional  . Drug Use: No  . Sexual Activity: Yes   Other Topics Concern  . None   Social History Narrative     ROS:  General:see hpi ENT: Negative for hoarseness, difficulty swallowing , nasal congestion. CV: Negative for chest pain, angina, palpitations, dyspnea on exertion, peripheral edema.  Respiratory: Negative for dyspnea at rest, dyspnea on exertion, cough, sputum, wheezing.  GI: See history of present illness. GU:  Negative for dysuria, hematuria, urinary incontinence, urinary frequency, nocturnal urination.  Endo: Negative for unusual weight change.    Physical Examination:   BP 128/81 mmHg  Pulse 86  Temp(Src) 97.2 F (36.2 C)  Ht '6\' 1"'$  (1.854 m)  Wt 156 lb 6.4 oz (70.943 kg)  BMI 20.64 kg/m2  General: Well-nourished, well-developed in no  acute distress.  Eyes: No icterus. Mouth: Oropharyngeal mucosa moist and pink , no lesions erythema or exudate. Lungs: Clear to auscultation bilaterally.  Heart: Regular rate and rhythm, no murmurs rubs or gallops.  Abdomen: Bowel sounds are normal, tenderness noted with palpation of lower right rib cage and just below ribs on right side. nondistended, no hepatosplenomegaly or masses, no abdominal bruits or hernia , no rebound or guarding.   Extremities: No lower extremity edema. No clubbing or deformities. Neuro: Alert and oriented x 4   Skin: Warm and dry, no jaundice.   Psych: Alert and cooperative, normal mood and affect.  Labs:  01/03/2016 at [facility] [address] Sodium 133, BUN 7, creatinine 0.61, glucose 91, total bilirubin 0.4, alkaline phosphatase 105, AST 17, ALT 12, albumin 3.5. Hemoglobin 15.8, hematocrit 47, white blood cell count 5200, platelets 229,000 Imaging Studies: Dg Chest 2 View  01/05/2016  CLINICAL  DATA:  Right side back pain for over a year. [place] on back last year. COPD, smoker, hypertension. EXAM: CHEST  2 VIEW COMPARISON:  11/11/2015 FINDINGS: There is hyperinflation of the lungs compatible with COPD. Heart and mediastinal contours are within normal limits. No focal opacities or effusions. No acute bony abnormality. IMPRESSION: COPD.  No active disease. Electronically Signed   By: Rolm Baptise M.D.   On: 01/05/2016 11:11   Gastric emptying study at [facility] on 01/06/2016  Normal 2-hour gastric retention.  Normal 4-hour gastric retention.   Result Narrative  NM SOLID GASTRIC EMPTYING, 01/06/2016 1:28 PM  INDICATION:  early satietyR68.81 Early satiety  COMPARISON: None.  TECHNIQUE: Diabetic patients were asked to report fingerstick blood glucose reading prior to the meal to ensure a level <270 mg/dL.  The patient consumed a standardized meal (eggbeaters, two pieces of white toast, jam) labeled with 1.1 mCi Tc-70msulfur colloid. Serial imaging up to four hours was performed in the anterior and posterior projections and the geometric mean of counts was obtained.  The patient consumed 100% of the solid meal and 100% of the provided water.  FINDINGS:  Solid gastric retention percentages at approximate times with normal ranges:  0 minutes: 100%  60 minutes (normal 37-90%): 65.5%  120 minutes (normal 30%-60%): 41.2%  180 minutes (normal 10%-29%): 18.8%  240 minutes (normal 0%-9%): 4.0%    [facility] IMPRESSION: No acute findings in the abdomen and pelvis  Small left inguinal hernia containing a short segment of ascending colon         Result Narrative  TECHNIQUE: Following the intravenous administration of iodinated contrast, helical imaging through the abdomen and pelvis was performed.    Radiation dose reduction was utilized (automated exposure control, mA or kV adjustment based on patient size,  or iterative image reconstruction).   IV contrast: Contrast: 80 mL cc IOPAMIDOL 76 % IV SOLN   COMPARISON:  None  INDICATION: Right Lower Quadrant Pain  FINDINGS: CHEST: Lung bases clear. No nodule or consolidation seen. No pleural effusion or pneumothorax  Abdomen and pelvis: . Liver: Normal . Gallbladder: Absent . Biliary: Normal . Pancreas: Normal . Adrenals: Normal . Spleen: Small cyst in the spleen . Kidneys and ureters: Normal. No hydronephrosis or stones . Stomach, small bowel, and large bowel: No wall thickening, mass, dilatation, or obstruction . Appendix: Normal . Peritoneum: No free fluid or free air. Small left inguinal hernia containing a short segment ascending colon . Lymph nodes: No lymphadenopathy . Urinary bladder:  Normal . Reproductive tract: Normal for age . Additional comments:None . Musculoskeletal:No aggressive lesions or fractures   CT chest with contrast at no vomiting on 01/19/2016 TECHNIQUE: CT chest. 80 Isovue-370 were administered. Dose reduction was utilized (automated exposure control, mA or kV adjustment based on patient size, or iterative image reconstruction).  Indication:COPD, cough, right-sided chest pain, abnormal weight loss  Comparison:  None.  FINDINGS:  Lungs:  Advanced emphysema present throughout both lungs. No infiltrates or effusions are present.   Mildly spiculated lesion is present in the medial subpleural right upper lobe adjacent paraspinal soft tissues measuring 9 x 10 mm on image 32 lung windows. Similar subpleural nodule medial right upper lobe measures 3 mm on image 43. This could represent a fissural node although shape is atypical. 5 mm nodule present also in or adjacent the right major fissure on image 46. 2 mm and 3 mm superior segment  right lower lobe nodules present on image 52. Faint 4 mm nodule on the right major fissure on image 57. Typical appearing right fissural node on image 61. No visible nodules in the  left lung.  Mediastinum:  Central airways are patent. Potential subcarinal adenopathy measuring 17 x 20 mm on image 49 mediastinal windows. No enlarged hilar lymph nodes. 12 mm AP window node on image 41.  Abdomen: no significant abnormalities in the visulized abdominal organs.  MSK: no acute abnormalities or aggressive lesions.   IMPRESSION:  1. Moderate to advanced emphysema. 2. Spiculated right upper lobe lesion measuring 10 mm maximum. 3. Multiple additional right lung nodules several of which are likely fissural lymph nodes. 4. Subcarinal adenopathy..  Electronically Signed by Delma Post     Radiation dose reduction was utilized (automated exposure control, mA or kV adjustment based on patient size, or iterative image reconstruction).   CT abdomen pelvis with contrast on 12/19/2015 CLINICAL DATA: 60 year old male with right abdominal pelvic pain for 7 months. History of primary biliary cirrhosis.  EXAM: CT ABDOMEN AND PELVIS WITH CONTRAST  TECHNIQUE: Multidetector CT imaging of the abdomen and pelvis was performed using the standard protocol following bolus administration of intravenous contrast.  CONTRAST: 168m OMNIPAQUE IOHEXOL 300 MG/ML SOLN  COMPARISON: 11/01/2015 and prior exams  FINDINGS: Lower chest: No acute abnormalities.  Hepatobiliary: Liver is unchanged. No suspicious focal hepatic lesions identified. Patient is status post cholecystectomy. There is no evidence biliary dilatation.  Pancreas: Unremarkable  Spleen: Splenomegaly again noted and unchanged. No focal splenic lesions present.  Adrenals/Urinary Tract: The kidneys, adrenal glands and bladder are unremarkable except for unchanged probable tiny bilateral renal cysts.  Stomach/Bowel: There is no evidence of bowel obstruction or definite bowel wall thickening.  Vascular/Lymphatic: Aortic atherosclerotic calcifications noted without aneurysm. No enlarged lymph nodes  identified.  Reproductive: Prostate enlargement again noted.  Other: No free fluid, abscess or pneumoperitoneum.  Musculoskeletal: No acute or suspicious abnormalities.  IMPRESSION: No acute abnormalities.  Unchanged appearance of the liver without suspicious focal abnormalities. Unchanged splenomegaly.  Aortic atherosclerosis.   Electronically Signed  By: JMargarette CanadaM.D.  On: 12/19/2015 12:33

## 2016-01-24 NOTE — Progress Notes (Signed)
Pt is aware.  

## 2016-01-24 NOTE — Patient Instructions (Signed)
1. Call me when you have your ultrasound test done. 2. Start Linzess 26mg daily on empty stomach. Samples provided. RX sent to pharmacy. Call is not effective or you have significant diarrhea. At that point, we would try Movantik. 3. I will check into your reflux medication coverage for you. 4. Follow up with Dr. GHilma Favorsregarding Chest CT results.  5. Further recommendations to follow.

## 2016-03-01 ENCOUNTER — Telehealth: Payer: Self-pay | Admitting: Internal Medicine

## 2016-03-01 NOTE — Telephone Encounter (Signed)
I haven't yet. Will be on the look out.

## 2016-03-01 NOTE — Telephone Encounter (Signed)
Jeffrey Gutierrez, have you gotten a refill request for him? There is nothing in EPIC

## 2016-03-01 NOTE — Telephone Encounter (Signed)
Pt called to let us know that his pharmacy will be sending something to [place] about his refills. I told him that I would let JL be aware. [phone]

## 2016-03-02 ENCOUNTER — Other Ambulatory Visit: Payer: Self-pay

## 2016-03-02 MED ORDER — OCALIVA 5 MG PO TABS: 1.0000 | ORAL_TABLET | Freq: Every day | ORAL | Status: DC

## 2016-03-02 NOTE — Telephone Encounter (Signed)
I received a call from [facility] and I have put in the refill and put their pharmacy for the [facility].

## 2016-03-07 ENCOUNTER — Other Ambulatory Visit (HOSPITAL_COMMUNITY): Payer: Self-pay | Admitting: Pulmonary Disease

## 2016-03-07 DIAGNOSIS — R918 Other nonspecific abnormal finding of lung field: Secondary | ICD-10-CM

## 2016-03-13 ENCOUNTER — Other Ambulatory Visit (HOSPITAL_COMMUNITY): Payer: Self-pay | Admitting: Oncology

## 2016-03-13 ENCOUNTER — Encounter (HOSPITAL_COMMUNITY)
Admission: RE | Admit: 2016-03-13 | Discharge: 2016-03-13 | Disposition: A | Discharge status: Home / Self Care | Payer: 59 | Source: Ambulatory Visit | Attending: Pulmonary Disease | Admitting: Pulmonary Disease

## 2016-03-13 DIAGNOSIS — R948 Abnormal results of function studies of other organs and systems: Secondary | ICD-10-CM

## 2016-03-13 DIAGNOSIS — R918 Other nonspecific abnormal finding of lung field: Secondary | ICD-10-CM | POA: Diagnosis present

## 2016-03-13 LAB — GLUCOSE, CAPILLARY: Glucose-Capillary: 104 mg/dL — ABNORMAL HIGH (ref 65–99)

## 2016-03-13 MED ORDER — FLUDEOXYGLUCOSE F - 18 (FDG) INJECTION
8.0000 | Freq: Once | INTRAVENOUS | Status: AC | PRN
  Administered 2016-03-13: 8 via INTRAVENOUS

## 2016-03-23 ENCOUNTER — Other Ambulatory Visit: Payer: Self-pay | Admitting: Radiology

## 2016-03-24 ENCOUNTER — Ambulatory Visit (HOSPITAL_COMMUNITY)
Admission: RE | Admit: 2016-03-24 | Discharge: 2016-03-24 | Disposition: A | Discharge status: Home / Self Care | Payer: 59 | Source: Ambulatory Visit | Attending: Oncology | Admitting: Oncology

## 2016-03-24 ENCOUNTER — Encounter (HOSPITAL_COMMUNITY): Payer: Self-pay

## 2016-03-24 DIAGNOSIS — Z9889 Other specified postprocedural states: Secondary | ICD-10-CM

## 2016-03-24 DIAGNOSIS — C778 Secondary and unspecified malignant neoplasm of lymph nodes of multiple regions: Secondary | ICD-10-CM | POA: Diagnosis not present

## 2016-03-24 DIAGNOSIS — M549 Dorsalgia, unspecified: Secondary | ICD-10-CM | POA: Diagnosis not present

## 2016-03-24 DIAGNOSIS — I10 Essential (primary) hypertension: Secondary | ICD-10-CM | POA: Diagnosis not present

## 2016-03-24 DIAGNOSIS — Z801 Family history of malignant neoplasm of trachea, bronchus and lung: Secondary | ICD-10-CM | POA: Insufficient documentation

## 2016-03-24 DIAGNOSIS — C801 Malignant (primary) neoplasm, unspecified: Secondary | ICD-10-CM | POA: Insufficient documentation

## 2016-03-24 DIAGNOSIS — Z7951 Long term (current) use of inhaled steroids: Secondary | ICD-10-CM | POA: Diagnosis not present

## 2016-03-24 DIAGNOSIS — R634 Abnormal weight loss: Secondary | ICD-10-CM | POA: Insufficient documentation

## 2016-03-24 DIAGNOSIS — Z79899 Other long term (current) drug therapy: Secondary | ICD-10-CM | POA: Insufficient documentation

## 2016-03-24 DIAGNOSIS — K219 Gastro-esophageal reflux disease without esophagitis: Secondary | ICD-10-CM | POA: Diagnosis not present

## 2016-03-24 DIAGNOSIS — J449 Chronic obstructive pulmonary disease, unspecified: Secondary | ICD-10-CM | POA: Insufficient documentation

## 2016-03-24 DIAGNOSIS — F17210 Nicotine dependence, cigarettes, uncomplicated: Secondary | ICD-10-CM | POA: Insufficient documentation

## 2016-03-24 DIAGNOSIS — Z885 Allergy status to narcotic agent status: Secondary | ICD-10-CM | POA: Diagnosis not present

## 2016-03-24 DIAGNOSIS — R948 Abnormal results of function studies of other organs and systems: Secondary | ICD-10-CM

## 2016-03-24 DIAGNOSIS — K743 Primary biliary cirrhosis: Secondary | ICD-10-CM | POA: Diagnosis not present

## 2016-03-24 LAB — BASIC METABOLIC PANEL
ANION GAP: 7 (ref 5–15)
BUN: 6 mg/dL (ref 6–20)
CHLORIDE: 96 mmol/L — AB (ref 101–111)
CO2: 29 mmol/L (ref 22–32)
Calcium: 9.2 mg/dL (ref 8.9–10.3)
Creatinine, Ser: 0.72 mg/dL (ref 0.61–1.24)
GFR calc non Af Amer: >60 mL/min (ref >60)
Glucose, Bld: 103 mg/dL — ABNORMAL HIGH (ref 65–99)
Potassium: 4 mmol/L (ref 3.5–5.1)
Sodium: 132 mmol/L — ABNORMAL LOW (ref 135–145)

## 2016-03-24 LAB — CBC WITH DIFFERENTIAL/PLATELET
Basophils Absolute: 0 10*3/uL (ref 0.0–0.1)
Basophils Relative: 0 %
EOS ABS: 0 10*3/uL (ref 0.0–0.7)
EOS PCT: 1 %
HCT: 44.1 % (ref 39.0–52.0)
Hemoglobin: 14.8 g/dL (ref 13.0–17.0)
LYMPHS ABS: 1.3 10*3/uL (ref 0.7–4.0)
LYMPHS PCT: 26 %
MCH: 29.4 pg (ref 26.0–34.0)
MCHC: 33.6 g/dL (ref 30.0–36.0)
MCV: 87.5 fL (ref 78.0–100.0)
MONO ABS: 0.5 10*3/uL (ref 0.1–1.0)
Monocytes Relative: 10 %
Neutro Abs: 3.2 10*3/uL (ref 1.7–7.7)
Neutrophils Relative %: 63 %
PLATELETS: 204 10*3/uL (ref 150–400)
RBC: 5.04 MIL/uL (ref 4.22–5.81)
RDW: 14.3 % (ref 11.5–15.5)
WBC: 5.1 10*3/uL (ref 4.0–10.5)

## 2016-03-24 LAB — PROTIME-INR
INR: 1.15 (ref 0.00–1.49)
PROTHROMBIN TIME: 14.9 s (ref 11.6–15.2)

## 2016-03-24 MED ORDER — FENTANYL CITRATE (PF) 100 MCG/2ML IJ SOLN
INTRAMUSCULAR | Status: AC
  Filled 2016-03-24: qty 2

## 2016-03-24 MED ORDER — FENTANYL CITRATE (PF) 100 MCG/2ML IJ SOLN
INTRAMUSCULAR | Status: AC | PRN
  Administered 2016-03-24: 50 ug via INTRAVENOUS

## 2016-03-24 MED ORDER — MIDAZOLAM HCL 2 MG/2ML IJ SOLN
INTRAMUSCULAR | Status: AC
  Filled 2016-03-24: qty 2

## 2016-03-24 MED ORDER — MIDAZOLAM HCL 2 MG/2ML IJ SOLN
INTRAMUSCULAR | Status: AC | PRN
  Administered 2016-03-24: 1 mg via INTRAVENOUS

## 2016-03-24 MED ORDER — SODIUM CHLORIDE 0.9 % IV SOLN
INTRAVENOUS | Status: DC
  Administered 2016-03-24: 10:00:00 via INTRAVENOUS

## 2016-03-24 MED ORDER — LIDOCAINE HCL 1 % IJ SOLN
INTRAMUSCULAR | Status: AC
  Filled 2016-03-24: qty 20

## 2016-03-24 NOTE — Sedation Documentation (Signed)
Patient is resting comfortably. 

## 2016-03-24 NOTE — Discharge Instructions (Signed)

## 2016-03-24 NOTE — Procedures (Signed)
R internal mammary LN Bx 18 g core times 2 No comp/EBL

## 2016-03-24 NOTE — Sedation Documentation (Signed)
Transported pt to CT for biopsy. Pt vitals stable. Pt complains of back pain because of laying flat. Pt states that it is difficult to tolerate this position. Repositioned pt with knees elevated, he states this feels better in this position. MD aware

## 2016-03-24 NOTE — H&P (Signed)
Chief Complaint: Patient was seen in consultation today for biopsy of right anterior pleural/internal mammary lymph node at the request of [name]  Referring Physician(s): Baird Cancer  Supervising Physician: Marybelle Killings  Patient Status: Outpatient  History of Present Illness: Jeffrey Gutierrez is a 60 y.o. male   Hx primary biliary cirrhosis; follows with Dr Sydell Axon in [place], [place] Complaints started 1 yr ago with constipation Weight loss GI MD evaluated and treated . Developed Rt sided pain and increasing Blood Pressure PMD Aquilla Solian evaluated and work up ensued Was referred to Dr Velvet Bathe for findings of "Lung lesions" 6/26 PET: IMPRESSION: 1. 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 Multiple hypermetabolic pleural base lesions are identified. Many of these have associated destructive changes involving the adjacent bony structures. 2. 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  Now request for biopsy of right anterior pleural/internal mammary lymph node biopsy    Past Medical History  Diagnosis Date  . PBC (primary biliary cirrhosis) ([place])     Diagnosed 2006  . S/P colonoscopy 2009    Pancolonic diverticula, hyperplastic rectal polyps  . Hyperplastic rectal polyp   . Diverticulitis   . COPD (chronic obstructive pulmonary disease) ([place])   . Essential hypertension   . GERD (gastroesophageal reflux disease)   . Hiatal hernia     Past Surgical History  Procedure Laterality Date  . Cholecystectomy    . Liver biopsy    . Nasal septum surgery    . Arm surgery Left     ulnar nerve release  . Colonoscopy  2009    Pancolonic diverticula, hyperplastic rectal polyps  . Colonoscopy [name] 03/24/2015    RMR: Single colonic polyp removed as described above. Pancolonic diverticulosis. Abnormal sigmoid colon consistent with stigmata of recent diverticulitis.   .  Esophagogastroduodenoscopy [name] 06/28/2015    RMR: mild erosive reflux/small HH  . Inguinal hernia repair Right Feb 2012    Dr. Arnoldo Morale   . Inguinal hernia repair Left 11/12/2015    Procedure: HERNIA REPAIR INGUINAL ADULT WITH MESH;  Surgeon: Aviva Signs, MD;  Location: [facility];  Service: General;  Laterality: Left;  . Insertion of mesh Left 11/12/2015    Procedure: INSERTION OF MESH;  Surgeon: Aviva Signs, MD;  Location: [facility];  Service: General;  Laterality: Left;    Allergies: Dihydrocodeine; Hydrocodone; Doxycycline; and Linzess  Medications: Prior to Admission medications   Medication Sig Start Date End Date Taking? Authorizing Provider  acetaminophen (TYLENOL) 500 MG tablet Take 1,000 mg by mouth every 8 (eight) hours as needed for moderate pain or headache.    Yes Historical Provider, MD  albuterol (PROVENTIL HFA;VENTOLIN HFA) 108 (90 Base) MCG/ACT inhaler Inhale 2 puffs into the lungs every 4 (four) hours as needed for wheezing or shortness of breath. 09/17/15  Yes Merrily Pew, MD  budesonide-formoterol [facility]) 160-4.5 MCG/ACT inhaler Inhale 2 puffs into the lungs 2 (two) times daily.   Yes Historical Provider, MD  DULoxetine (CYMBALTA) 30 MG capsule Take 30 mg by mouth daily.  02/23/16  Yes Historical Provider, MD  Multiple Vitamin (MULTIVITAMIN WITH MINERALS) TABS tablet Take 1 tablet by mouth daily.   Yes Historical Provider, MD  OCALIVA 5 MG TABS Take 1 tablet by mouth daily. Patient taking differently: Take 5 mg by mouth daily.  03/02/16  Yes Orvil Feil, NP  omeprazole (PRILOSEC) 20 MG capsule Take 1 capsule (20 mg total) by mouth  daily. 01/24/16  Yes Mahala Menghini, PA-C  polyethylene glycol (MIRALAX / GLYCOLAX) packet Take 34 g by mouth 2 (two) times daily.    Yes Historical Provider, MD  tiotropium (SPIRIVA) 18 MCG inhalation capsule Place 18 mcg into inhaler and inhale daily.   Yes Historical Provider, MD  traMADol (ULTRAM) 50 MG tablet Take 50-100 mg by mouth 4 (four)  times daily.  02/03/16  Yes Historical Provider, MD  ibuprofen (ADVIL,MOTRIN) 200 MG tablet Take 200 mg by mouth every 6 (six) hours as needed for mild pain or moderate pain. Reported on 12/14/2015    Historical Provider, MD  lisinopril-hydrochlorothiazide (PRINZIDE,ZESTORETIC) 20-12.5 MG tablet Take 1 tablet by mouth daily as needed (high blood pressure).  06/23/15   Historical Provider, MD     Family History  Problem Relation Age of Onset  . Pneumonia Father   . Lung cancer Mother   . Lung cancer Father   . Pancreatitis Father     Social History   Social History  . Marital Status: Married    Spouse Name: [name]  . Number of Children: [name]  . Years of Education: [name]   Social History Main Topics  . Smoking status: Current Some Day Smoker -- 0.50 packs/day for 43 years    Types: Cigarettes  . Smokeless tobacco: Never Used  . Alcohol Use: 0.0 oz/week    0 Standard drinks or equivalent per week     Comment: occassional  . Drug Use: No  . Sexual Activity: Yes   Other Topics Concern  . None   Social History Narrative     Review of Systems: A 12 point ROS discussed and pertinent positives are indicated in the HPI above.  All other systems are negative.  Review of Systems  Constitutional: Positive for activity change, fatigue and unexpected weight change. Negative for fever.  Respiratory: Negative for cough and shortness of breath.   Gastrointestinal: Positive for abdominal pain and constipation.  Musculoskeletal: Positive for back pain.  Neurological: Positive for weakness.  Psychiatric/Behavioral: Negative for behavioral problems and confusion.    Vital Signs: BP 143/84 mmHg  Pulse 80  Temp(Src) 98 F (36.7 C) (Oral)  Resp 16  Ht '6\' 1"'$  (1.854 m)  Wt 148 lb (67.132 kg)  BMI 19.53 kg/m2  SpO2 93%  Physical Exam  Constitutional: He is oriented to person, place, and time.  Cardiovascular: Normal rate, regular rhythm and normal heart sounds.   Pulmonary/Chest: He has  wheezes.  Abdominal: Soft. Bowel sounds are normal. There is no tenderness.  Musculoskeletal: Normal range of motion.  Neurological: He is alert and oriented to person, place, and time.  Skin: Skin is warm and dry.  Psychiatric: He has a normal mood and affect. His behavior is normal. Judgment and thought content normal.  Nursing note and vitals reviewed.   Mallampati Score:  MD Evaluation Airway: WNL Heart: WNL Abdomen: WNL Chest/ Lungs: WNL ASA  Classification: 3 Mallampati/Airway Score: One  Imaging: Nm Pet Image Initial (pi) Skull Base To Thigh  03/13/2016  CLINICAL DATA:  Initial treatment strategy for lung cancer. EXAM: NUCLEAR MEDICINE PET SKULL BASE TO THIGH TECHNIQUE: 8.0 mCi F-18 FDG was injected intravenously. Full-ring PET imaging was performed from the skull base to thigh after the radiotracer. CT data was obtained and used for attenuation correction and anatomic localization. FASTING BLOOD GLUCOSE:  Value: 104 mg/dl COMPARISON:  None. FINDINGS: NECK No hypermetabolic lymph nodes in the neck. CHEST Normal heart size. There is aortic atherosclerosis  noted. Calcification in the LAD coronary artery noted. Multiple enlarged and hypermetabolic mediastinal lymph nodes are identified. Index right paratracheal lymph node measures 1.4 cm and has an SUV max equal to 6.6 image 76 of series 4. Index sub- carinal lymph node measures 1.8 cm and has an SUV max equal to 12.8, image 87 of series 4. Index posterior mediastinal lymph node measures 1.3 cm and has an SUV max equal to 9.3, image 97 of series 4. Hypermetabolic right internal mammary lymph node measures 1.6 cm and has an SUV max equal to 6.99, image 101 of series 4. There are numerous pleural based lesions overlying the right lung which exhibit intense FDG uptake. Many of these involve the a J osseous structures. Index lesion overlying the medial left upper lobe measures approximately 2.1 x 2.5 cm and has an SUV max equal to 12.58. There  is associated lytic and sclerotic changes involving the right side of the T5 vertebra. Lesion overlying the anterior lateral aspect of the right middle lobe measures 2 x 0.8 cm and has an SUV max equal to 10.46. Associated lytic changes are noted involving the overlying right seventh rib, image 113 of series 4. ABDOMEN/PELVIS No abnormal hypermetabolic activity within the liver, pancreas, adrenal glands, or spleen. No hypermetabolic lymph nodes in the abdomen or pelvis. SKELETON No focal hypermetabolic activity to suggest skeletal metastasis. IMPRESSION: 1. 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 Multiple hypermetabolic pleural base lesions are identified. Many of these have associated destructive changes involving the adjacent bony structures. 2. 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 Electronically Signed   By: Kerby Moors M.D.   On: 03/13/2016 14:07    Labs:  CBC:  Recent Labs  11/11/15 0820 12/14/15 0746 12/19/15 1125 03/24/16 0945  WBC 3.7* 4.7 4.9 5.1  HGB 14.3 15.3 14.5 14.8  HCT 42.3 44.9 42.8 44.1  PLT 172 223 182 204    COAGS:  Recent Labs  07/31/15 1014 08/04/15 0948 03/24/16 0945  INR CANCELED 1.00 1.15    BMP:  Recent Labs  08/03/15 1607 11/11/15 0820 12/14/15 0746 12/19/15 1125 03/24/16 0945  NA 137 134* 134* 134* 132*  K 4.0 4.3 4.7 4.4 4.0  CL 101 99* 95* 100* 96*  CO2 '31 29 30 28 29  '$ GLUCOSE 116* 143* 108* 105* 103*  BUN '10 8 8 7 6  '$ CALCIUM 9.0 8.6* 9.4 8.6* 9.2  CREATININE 0.77 0.82 0.78 0.71 0.72  GFRNONAA >60 >60  --  >60 >60  GFRAA >60 >60  --  >60 >60    LIVER FUNCTION TESTS:  Recent Labs  07/31/15 1014 08/03/15 1607 12/14/15 0746 12/19/15 1125  BILITOT 0.6 0.5 0.6 0.4  AST '25 27 21 23  '$ ALT '23 19 15 '$ 15*  ALKPHOS 83 73 112 97  PROT 7.4 7.6 7.7 7.8  ALBUMIN 3.6 3.4* 3.6 3.4*    TUMOR MARKERS:  Recent Labs  07/31/15 1014  AFPTM 4.2     Assessment and Plan:  Rt side and back pain Wt loss Hx PBC PET + for right hemithorax tumor spread; pleural based lesions; destructive bony changes; mediastinal and right internal mammary lymph node metastasis Now scheduled for biopsy of LAN Risks and Benefits discussed with the patient including, but not limited to bleeding, infection, damage to adjacent structures or low yield requiring additional tests. All of the patient's questions were answered, patient is agreeable to proceed. Consent signed and in chart.  Thank you for this interesting consult.  I greatly enjoyed meeting MARCOANTONIO LEGAULT and look forward to participating in their care.  A copy of this report was sent to the requesting provider on this date.  Electronically Signed: Lynnsey Barbara A 03/24/2016, 10:16 AM   I spent a total of  30 Minutes   in face to face in clinical consultation, greater than 50% of which was counseling/coordinating care for mediastinal; R internal Mammary LAN bx

## 2016-03-24 NOTE — Sedation Documentation (Signed)
Procedure finished, pt tolerated well.

## 2016-03-30 ENCOUNTER — Encounter (HOSPITAL_COMMUNITY): Payer: 59 | Attending: Hematology & Oncology | Admitting: Hematology & Oncology

## 2016-03-30 ENCOUNTER — Encounter (HOSPITAL_COMMUNITY): Payer: Self-pay | Admitting: Hematology & Oncology

## 2016-03-30 VITALS — BP 131/77 | HR 89 | Temp 98.5°F | Resp 16 | Ht 70.0 in | Wt 144.1 lb

## 2016-03-30 DIAGNOSIS — C7951 Secondary malignant neoplasm of bone: Secondary | ICD-10-CM | POA: Diagnosis not present

## 2016-03-30 DIAGNOSIS — R634 Abnormal weight loss: Secondary | ICD-10-CM

## 2016-03-30 DIAGNOSIS — G893 Neoplasm related pain (acute) (chronic): Secondary | ICD-10-CM

## 2016-03-30 DIAGNOSIS — C3491 Malignant neoplasm of unspecified part of right bronchus or lung: Secondary | ICD-10-CM

## 2016-03-30 DIAGNOSIS — R109 Unspecified abdominal pain: Secondary | ICD-10-CM

## 2016-03-30 DIAGNOSIS — B370 Candidal stomatitis: Secondary | ICD-10-CM

## 2016-03-30 DIAGNOSIS — Z720 Tobacco use: Secondary | ICD-10-CM

## 2016-03-30 MED ORDER — HYDROMORPHONE HCL 4 MG PO TABS: ORAL_TABLET | ORAL | Status: DC

## 2016-03-30 NOTE — Progress Notes (Signed)
[place]  CONSULT NOTE  Patient Care Team: Sharilyn Sites, MD as PCP - General (Family Medicine) Daneil Dolin, MD (Gastroenterology)  CHIEF COMPLAINTS/PURPOSE OF CONSULTATION:    Adenocarcinoma of right lung, stage 4 ([place])   10/22/2015 Imaging    CT abdomen/pelvis with L inguinal hernia which contains large bowel, borderline splenomegaly     01/19/2016 Imaging    CT chest with moderate to advanced emphysema, spiculated RUL lesion measuring 10 mm maximum, multiple additional RUL nodules which are likely fissural LN subcarinal adenopathy     03/07/2016 Imaging    MRI thoracic spine with findings at T5 and T8 suggesting metastatic disease, possibly from a lung carcinoma.      03/13/2016 PET scan    Evidence of trans pleural spread of tumor overlying the R hemi thorax, multiple hypermet pleural base lesions are identified. many of these have associated destructive changes involving the adjacent bony structures. hypermet  med and R int mamm LN mets     03/24/2016 Initial Biopsy    CT guided biopsy R internal mammary LN     03/24/2016 Pathology Results    Adenocarcinoma, differential includes primary lung     04/05/2016 Pathology Results    PDL1 negative- TPS 0%.     HISTORY OF PRESENTING ILLNESS:  Jeffrey Gutierrez 60 y.o. male is here because of newly diagnosed adenocarcinoma of the lung.  He goes by [employer]. He is here today with his wife, Jeffrey Gutierrez.  Since last July, he's lost weight from 205 to 144 pounds. He says he wants to eat, he starts eating, and then he feels full/doesn't want anymore. He denies any new pain, saying "no, not recently."  He has been followed by GI, also been referred to GI at [facility]. Questionable diagnosis of PBC and early cirrhosis.   When asked about the MRI of his back, he says that his back was bothering him; so Dr. Hilma Favors ordered it. His back has been bothering him on and off ever since last year, when he fell on the floor at work. He was trying to  wrench a bolt loose; he got caught on a lift and landed flat on his back. He notes being more worried about his head because he has bone spurs in his neck, which he already sees a chiropractor for. Mr. Kushnir says Dr. Hilma Favors called him back to the office after the MRI of his back, saying it was "suspicious." It took him a month to get a PET scan done at [facility], because they "lost his paperwork."  Regarding his back pain, he notes that some days his pain does limit him. This frustrates him in regards to work.  His wife notes that he was diagnosed with PBC a few years ago. He couldn't go to the bathroom, so he went to Dr. Hilma Favors, and was sent to Dr. Gala Romney; he notes "we did upper and lower GI and did bloodwork." His wife says "they've said he doesn't have ulcerative colitis or crohn's." Dr. Gala Romney referred him up to [facility] after he noticed his weight loss. Both Mr. Granier and his wife note that [facility] couldn't find anything in terms of ulcerative colitis or crohn's, either. His wife notes that Mina Marble looked at his stomach, and that Mr. Nimmons had a digestive test done. The last time he went there was about a month ago. Because he was still in pain, they referred him to one of their pain specialists.  He has had a  cough and SOB. He notes that the cough wasn't that bad until he had the upper GI tests done. His wife traces his SOB back to the time he went to the ER at [facility] thinking he might be having heart attack.  He was put on Gabapentin at the [facility] pain clinic, and put on an antidepressant (duloxetine). He reverted back to tramadol for his pain, noting "it eases it off." He also takes an Advil or a Tylenol with it. He notes that most of the time it's sufficient for controlling his pain, except for at night; sometimes he can't sleep due to his pain. His wife says that he can't sleep in the bed anymore and has to sleep in the recliner.  He cannot take oxycodone because his throat swells up. He  notes that he doesn't want to take morphine.He would rather take dilaudid.  His wife notes that they know how to live with cancer after all of their parents have gone through it. She becomes very emotional and remarks "I knew this bridge would have to be crossed, but I did not think it would have to be this soon." She says that it feels like "your hopes and dreams are going out the window."  During the physical exam, he notes that he tries to take two Boost a day. Vitals - 1 value per visit 03/30/2016 03/24/2016 01/24/2016 12/21/2015 12/19/2015  Weight (lb) 144.1 148 156.4 158.6 161.5   Vitals - 1 value per visit 12/14/2015 11/23/2015 11/12/2015 11/11/2015 10/12/2015  Weight (lb) 161.6 163.4  173 176.4   Vitals - 1 value per visit 09/27/2015 09/17/2015 08/03/2015 07/26/2015  Weight (lb) 179.2 180  182   Vitals - 1 value per visit 06/28/2015 06/24/2015 06/24/2015 05/03/2015  Weight (lb) 184 184.2 181 198   Vitals - 1 value per visit 04/05/2015 03/24/2015 03/10/2015 02/04/2015  Weight (lb) 200  196.6 198.4    MEDICAL HISTORY:  Past Medical History  Diagnosis Date  . PBC (primary biliary cirrhosis) ([place])     Diagnosed 2006  . S/P colonoscopy 2009    Pancolonic diverticula, hyperplastic rectal polyps  . Hyperplastic rectal polyp   . Diverticulitis   . COPD (chronic obstructive pulmonary disease) ([place])   . Essential hypertension   . GERD (gastroesophageal reflux disease)   . Hiatal hernia     SURGICAL HISTORY: Past Surgical History  Procedure Laterality Date  . Cholecystectomy    . Liver biopsy    . Nasal septum surgery    . Arm surgery Left     ulnar nerve release  . Colonoscopy  2009    Pancolonic diverticula, hyperplastic rectal polyps  . Colonoscopy [name] 03/24/2015    RMR: Single colonic polyp removed as described above. Pancolonic diverticulosis. Abnormal sigmoid colon consistent with stigmata of recent diverticulitis.   . Esophagogastroduodenoscopy [name] 06/28/2015    RMR: mild erosive  reflux/small HH  . Inguinal hernia repair Right Feb 2012    Dr. Arnoldo Morale   . Inguinal hernia repair Left 11/12/2015    Procedure: HERNIA REPAIR INGUINAL ADULT WITH MESH;  Surgeon: Aviva Signs, MD;  Location: [facility];  Service: General;  Laterality: Left;  . Insertion of mesh Left 11/12/2015    Procedure: INSERTION OF MESH;  Surgeon: Aviva Signs, MD;  Location: [facility];  Service: General;  Laterality: Left;    SOCIAL HISTORY: Social History   Social History  . Marital Status: Married    Spouse Name: [name]  . Number of Children: [name]  .  Years of Education: [name]   Occupational History  . Not on file.   Social History Main Topics  . Smoking status: Current Some Day Smoker -- 0.50 packs/day for 43 years    Types: Cigarettes  . Smokeless tobacco: Never Used  . Alcohol Use: 0.0 oz/week    0 Standard drinks or equivalent per week     Comment: occassional  . Drug Use: No  . Sexual Activity: Yes   Other Topics Concern  . Not on file   Social History Narrative   Married 37 years in October of 2017. No children, 1 dog (beagle), 3 cats.   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 Hobbies include piano, playing music; organ; plays a little guitar. [place], camping. Likes going to [employer], [place]. Used to smoke; still smokes some. Not heavy anymore. Denies any problems with alcohol.  FAMILY HISTORY: Family History  Problem Relation Age of Onset  . Pneumonia Father   . Lung cancer Mother   . Lung cancer Father   . Pancreatitis Father    Both parents deceased. Mother was 75; Father was 73. She died of inoperable cancer; he's not sure what kind. Doesn't remember. Father died in 12-01-99; had lung cancer; took out the top of his R lung and radiated him. Dr. Beryle Beams found a "fungus ball" in his lung; broke loose. 2 sisters; both pretty healthy. One has Parkinson's and had to have a pacemaker put in. Other sister used to work here at  the hospital; she had to go out for heart and hip reasons.   ALLERGIES:  is allergic to dihydrocodeine; hydrocodone; doxycycline; and linzess.  MEDICATIONS:  Current Outpatient Prescriptions  Medication Sig Dispense Refill  . acetaminophen (TYLENOL) 500 MG tablet Take 1,000 mg by mouth every 8 (eight) hours as needed for moderate pain or headache.     . albuterol (PROVENTIL HFA;VENTOLIN HFA) 108 (90 Base) MCG/ACT inhaler Inhale 2 puffs into the lungs every 4 (four) hours as needed for wheezing or shortness of breath. 1 Inhaler 0  . budesonide-formoterol (SYMBICORT) 160-4.5 MCG/ACT inhaler Inhale 2 puffs into the lungs 2 (two) times daily.    Marland Kitchen ibuprofen (ADVIL,MOTRIN) 200 MG tablet Take 200 mg by mouth every 6 (six) hours as needed for mild pain or moderate pain. Reported on 12/14/2015    . OCALIVA 5 MG TABS Take 1 tablet by mouth daily. (Patient taking differently: Take 5 mg by mouth daily. ) 90 tablet 3  . omeprazole (PRILOSEC) 20 MG capsule Take 1 capsule (20 mg total) by mouth daily. 30 capsule 5  . polyethylene glycol (MIRALAX / GLYCOLAX) packet Take 34 g by mouth 2 (two) times daily.     Marland Kitchen tiotropium (SPIRIVA) 18 MCG inhalation capsule Place 18 mcg into inhaler and inhale daily.    . traMADol (ULTRAM) 50 MG tablet Take 50-100 mg by mouth 4 (four) times daily.     Marland Kitchen HYDROmorphone (DILAUDID) 4 MG tablet Take 1 to 2 tablets by mouth every 4 hours as needed for pain 90 tablet 0  . [DISCONTINUED] ipratropium (ATROVENT HFA) 17 MCG/ACT inhaler Inhale 2 puffs into the lungs every 6 (six) hours as needed for wheezing. (Patient not taking: Reported on 08/03/2015) 1 Inhaler 12  . [DISCONTINUED] pantoprazole (PROTONIX) 40 MG tablet Take 1 tablet (40 mg total) by mouth daily. (Patient not taking: Reported on 08/03/2015) 90 tablet 3   No current facility-administered medications for this visit.    Review  of Systems  Constitutional: Positive for weight loss.       Down from 205 to 144, since last  July.  HENT: Negative.   Eyes: Negative.   Respiratory: Positive for cough.   Cardiovascular: Positive for chest pain.  Gastrointestinal: Negative.   Genitourinary: Negative.   Musculoskeletal: Positive for back pain and falls.       Fell at work, which he blames for the onset of his back pain.  Skin: Negative.   Neurological: Negative.   Endo/Heme/Allergies: Negative.   Psychiatric/Behavioral: Negative.  Negative for substance abuse.       Smokes occasionally.  All other systems reviewed and are negative.  14 point ROS was done and is otherwise as detailed above or in HPI   PHYSICAL EXAMINATION: ECOG PERFORMANCE STATUS: 1 - Symptomatic but completely ambulatory  Filed Vitals:   03/30/16 1325  BP: 131/77  Pulse: 89  Temp: 98.5 F (36.9 C)  Resp: 16   Filed Weights   03/30/16 1325  Weight: 144 lb 1.6 oz (65.363 kg)     Physical Exam  Constitutional: He is oriented to person, place, and time and well-developed, well-nourished, and in no distress.  Fatty tumor on the right parotid.  HENT:  Head: Normocephalic and atraumatic.  Nose: Nose normal.  Mouth/Throat: Oropharynx is clear and moist. No oropharyngeal exudate.  Thrush  Eyes: Conjunctivae and EOM are normal. Pupils are equal, round, and reactive to light. Right eye exhibits no discharge. Left eye exhibits no discharge. No scleral icterus.  Neck: Normal range of motion. Neck supple. No tracheal deviation present. No thyromegaly present.  Cardiovascular: Normal rate, regular rhythm and normal heart sounds.  Exam reveals no gallop and no friction rub.   No murmur heard. Pulmonary/Chest: Effort normal. He has no wheezes. He has no rales.  Decreased BS R lung  Abdominal: Soft. Bowel sounds are normal. He exhibits no distension and no mass. There is no tenderness. There is no rebound and no guarding.  Musculoskeletal: Normal range of motion. He exhibits no edema.  Lymphadenopathy:    He has no cervical adenopathy.    Neurological: He is alert and oriented to person, place, and time. He has normal reflexes. No cranial nerve deficit. Gait normal. Coordination normal.  Skin: Skin is warm and dry. No rash noted.  Psychiatric: Mood, memory, affect and judgment normal.  Nursing note and vitals reviewed.   LABORATORY DATA:  I have reviewed the data as listed Lab Results  Component Value Date   WBC 5.1 03/24/2016   HGB 14.8 03/24/2016   HCT 44.1 03/24/2016   MCV 87.5 03/24/2016   PLT 204 03/24/2016   CMP     Component Value Date/Time   NA 132* 03/24/2016 0945   K 4.0 03/24/2016 0945   CL 96* 03/24/2016 0945   CO2 29 03/24/2016 0945   GLUCOSE 103* 03/24/2016 0945   BUN 6 03/24/2016 0945   CREATININE 0.72 03/24/2016 0945   CREATININE 0.78 12/14/2015 0746   CALCIUM 9.2 03/24/2016 0945   PROT 7.8 12/19/2015 1125   ALBUMIN 3.4* 12/19/2015 1125   AST 23 12/19/2015 1125   ALT 15* 12/19/2015 1125   ALKPHOS 97 12/19/2015 1125   BILITOT 0.4 12/19/2015 1125   GFRNONAA >60 03/24/2016 0945   GFRAA >60 03/24/2016 0945     RADIOGRAPHIC STUDIES: I have personally reviewed the radiological images as listed and agreed with the findings in the report. No results found.  Nm Pet Image Initial (pi) Skull Base  To Thigh  03/13/2016  CLINICAL DATA:  Initial treatment strategy for lung cancer. EXAM: NUCLEAR MEDICINE PET SKULL BASE TO THIGH TECHNIQUE: 8.0 mCi F-18 FDG was injected intravenously. Full-ring PET imaging was performed from the skull base to thigh after the radiotracer. CT data was obtained and used for attenuation correction and anatomic localization. FASTING BLOOD GLUCOSE:  Value: 104 mg/dl COMPARISON:  None. FINDINGS: NECK No hypermetabolic lymph nodes in the neck. CHEST Normal heart size. There is aortic atherosclerosis noted. Calcification in the LAD coronary artery noted. Multiple enlarged and hypermetabolic mediastinal lymph nodes are identified. Index right paratracheal lymph node measures 1.4 cm  and has an SUV max equal to 6.6 image 76 of series 4. Index sub- carinal lymph node measures 1.8 cm and has an SUV max equal to 12.8, image 87 of series 4. Index posterior mediastinal lymph node measures 1.3 cm and has an SUV max equal to 9.3, image 97 of series 4. Hypermetabolic right internal mammary lymph node measures 1.6 cm and has an SUV max equal to 6.99, image 101 of series 4. There are numerous pleural based lesions overlying the right lung which exhibit intense FDG uptake. Many of these involve the a J osseous structures. Index lesion overlying the medial left upper lobe measures approximately 2.1 x 2.5 cm and has an SUV max equal to 12.58. There is associated lytic and sclerotic changes involving the right side of the T5 vertebra. Lesion overlying the anterior lateral aspect of the right middle lobe measures 2 x 0.8 cm and has an SUV max equal to 10.46. Associated lytic changes are noted involving the overlying right seventh rib, image 113 of series 4. ABDOMEN/PELVIS No abnormal hypermetabolic activity within the liver, pancreas, adrenal glands, or spleen. No hypermetabolic lymph nodes in the abdomen or pelvis. SKELETON No focal hypermetabolic activity to suggest skeletal metastasis. IMPRESSION: 1. 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 Multiple hypermetabolic pleural base lesions are identified. Many of these have associated destructive changes involving the adjacent bony structures. 2. 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 Electronically Signed   By: Kerby Moors M.D.   On: 03/13/2016 14:07   Ct Biopsy  03/24/2016  INDICATION: Thoracic adenopathy EXAM: CT BIOPSY MEDICATIONS: None. ANESTHESIA/SEDATION: Fentanyl 50 mcg IV; Versed 1 mg IV Moderate Sedation Time:  15 The patient was continuously monitored during the procedure by the interventional radiology nurse under my direct supervision. FLUOROSCOPY TIME:   Fluoroscopy Time:  minutes  seconds ( mGy). COMPLICATIONS: None immediate. PROCEDURE: Informed written consent was obtained from the patient after a thorough discussion of the procedural risks, benefits and alternatives. All questions were addressed. Maximal Sterile Barrier Technique was utilized including caps, mask, sterile gowns, sterile gloves, sterile drape, hand hygiene and skin antiseptic. A timeout was performed prior to the initiation of the procedure. Under CT guidance, a(n) 17 gauge guide needle was advanced into the right internal mammary lymph node. Subsequently 3 18 gauge core biopsies were obtained. The guide needle was removed. Post biopsy images demonstrate no pneumothorax. Patient tolerated the procedure well without complication. Vital sign monitoring by nursing staff during the procedure will continue as patient is in the special procedures unit for post procedure observation. FINDINGS: The images document guide needle placement within the right internal mammary lymph node. Post biopsy images demonstrate no pneumothorax. IMPRESSION: Successful CT-guided right internal mammary lymph node biopsy. Electronically Signed   By: Marybelle Killings M.D.   On:  03/24/2016 15:51   Dg Chest Port 1 View  03/24/2016  CLINICAL DATA:  Status post lymph node biopsy. EXAM: PORTABLE CHEST 1 VIEW COMPARISON:  01/05/2016 FINDINGS: Emphysema without visible pneumothorax. No signs of parenchymal or mediastinal hemorrhage. Subtle right-sided parenchymal nodularity as seen on recent chest CT. Normal heart size and aortic contours. No acute osseous finding. IMPRESSION: No acute finding after biopsy. Emphysema. ([id number]) Electronically Signed   By: Monte Fantasia M.D.   On: 03/24/2016 12:56     PATHOLOGY I have reviewed the reports as listed.     ASSESSMENT & PLAN:  Adenocarcinoma of Lung Spine/rib metastases Tobacco Abuse Chronic Abdominal Pain Weight Loss Thrush Cancer related pain  I reviewed all  imaging and pathology with the patient and his wife today.  We discussed adenocarcinoma of the lung in detail. We reviewed the NCCN guidelines in detail and discussed the role of EGFR, ALK, ROS 1 and PD L1 testing.  We addressed staging and prognosis.  The patient and his wife are very overwhelmed by his diagnosis. I spent a significant amount of time in discussion regarding dealing/living with an advanced cancer diagnosis.  Will ensure that [facility] testing has been sent, make sure PD L1 is included.   I addressed the importance of smoking cessation with the patient in detail.  We discussed the health benefits of cessation.  We discussed the health detriments of ongoing tobacco use  We discussed other alternatives to quit such as chantix, wellbutrin. We will continue to address this moving forward.  I am going to refer him for palliate XRT to the back, I have discussed his case with Dr. Tammi Klippel. I advised the patient that he can move forward with this, get his port placed in anticipation for therapy while we are waiting for final genetic testing to return.   If he is PD L1 positive I have to confirm if he has PBC, which may exclude him from immunotherapy.  For pain control, we will try him on some dilaudid, as he is averse to morphine for personal reasons. I discussed with the patient that moving forward he may do best with morphine and with long acting pain medications. We will continue to educate him about this at each follow-up.  He and his wife met with Anderson Malta our patient navigator today.  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  I have prescribed diflucan for his thrush.   MRI brain ordered to complete staging.   ORDERS PLACED FOR THIS ENCOUNTER: Orders Placed This Encounter  Procedures  . MR Brain W Wo Contrast  . IR Fluoro Guide CV Line Left    MEDICATIONS PRESCRIBED THIS  ENCOUNTER: Meds ordered this encounter  Medications  . HYDROmorphone (DILAUDID) 4 MG tablet    Sig: Take 1 to 2 tablets by mouth every 4 hours as needed for pain    Dispense:  90 tablet    Refill:  0   All questions were answered. The patient knows to call the clinic with any problems, questions or concerns.  This document serves as a record of services personally performed by Ancil Linsey, MD. It was created on her behalf by Toni Amend, a trained medical scribe. The creation of this record is based on the scribe's personal observations and the provider's statements to them. This document has been checked and approved by the attending provider.  I have reviewed the above documentation for accuracy and completeness and I agree with the above.  This note was electronically signed.    Molli Hazard, MD  03/30/2016 2:57 PM

## 2016-03-30 NOTE — Patient Instructions (Signed)
[place] at [facility] Discharge Instructions  RECOMMENDATIONS MADE BY THE CONSULTANT AND ANY TEST RESULTS WILL BE SENT TO YOUR REFERRING PHYSICIAN.  You were seen by Dr. Whitney Muse today. You will be referred to Dr. Tammi Klippel for Radiation Treatments at [facility]. Your appointment is scheduled for tomorrow at 9:30 am.    MRI of your brain and Port-a-cath placement for treatments will be scheduled.   Return to clinic in 2 weeks for follow-up appointment. Call clinic with any questions or concerns.   Thank you for choosing [place] at [facility] to provide your oncology and hematology care.  To afford each patient quality time with our provider, please arrive at least 15 minutes before your scheduled appointment time.   Beginning January 23rd 2017 lab work for the [facility] will be done in the  Main lab at [facility] on 1st floor. If you have a lab appointment with the [facility] please come in thru the  Main Entrance and check in at the main information desk  You need to re-schedule your appointment should you arrive 10 or more minutes late.  We strive to give you quality time with our providers, and arriving late affects you and other patients whose appointments are after yours.  Also, if you no show three or more times for appointments you may be dismissed from the clinic at the providers discretion.     Again, thank you for choosing [facility].  Our hope is that these requests will decrease the amount of time that you wait before being seen by our physicians.       _____________________________________________________________  Should you have questions after your visit to [facility], please contact our office at [phone] between the hours of 8:30 a.m. and 4:30 p.m.  Voicemails left after 4:30 p.m. will not be returned until the following business day.  For prescription refill requests,  have your pharmacy contact our office.         Resources For Cancer Patients and their Caregivers ? American Cancer Society: Can assist with transportation, wigs, general needs, runs Look Good Feel Better.        812 784 7561 ? Cancer Care: Provides financial assistance, online support groups, medication/co-pay assistance.  1-800-813-HOPE (612) 447-9265) ? [facility] Assists [place] cancer patients and their families through emotional , educational and financial support.  641-441-9313 ? [place] DSS Where to apply for food stamps, Medicaid and utility assistance. 934-727-7591 ? [facility]: Transportation to medical appointments. (769)721-8621 ? Social Security Administration: May apply for disability if have a Stage IV cancer. 909-354-3779 (714)564-4245 ? [employer], Disability and Transit Services: Assists with nutrition, care and transit needs. [place] Support Programs: '@10RELATIVEDAYS'$ @ > Cancer Support Group  2nd Tuesday of the month 1pm-2pm, Journey Room  > Creative Journey  3rd Tuesday of the month 1130am-1pm, Journey Room  > Look Good Feel Better  1st Wednesday of the month 10am-12 noon, Journey Room (Call [facility] to register [phone])

## 2016-03-31 ENCOUNTER — Ambulatory Visit
Admission: RE | Admit: 2016-03-31 | Discharge: 2016-03-31 | Disposition: A | Discharge status: Home / Self Care | Payer: 59 | Source: Ambulatory Visit | Attending: Radiation Oncology | Admitting: Radiation Oncology

## 2016-03-31 ENCOUNTER — Encounter: Payer: Self-pay | Admitting: Radiation Oncology

## 2016-03-31 VITALS — BP 160/83 | HR 90 | Temp 98.6°F | Resp 16 | Wt 142.4 lb

## 2016-03-31 DIAGNOSIS — C3491 Malignant neoplasm of unspecified part of right bronchus or lung: Secondary | ICD-10-CM | POA: Insufficient documentation

## 2016-03-31 DIAGNOSIS — C7951 Secondary malignant neoplasm of bone: Secondary | ICD-10-CM | POA: Diagnosis present

## 2016-03-31 DIAGNOSIS — Z510 Encounter for antineoplastic radiation therapy: Secondary | ICD-10-CM | POA: Insufficient documentation

## 2016-03-31 HISTORY — DX: Malignant neoplasm of bone and articular cartilage, unspecified: C41.9

## 2016-03-31 HISTORY — DX: Malignant neoplasm of unspecified part of unspecified bronchus or lung: C34.90

## 2016-03-31 NOTE — Progress Notes (Signed)
  Radiation Oncology         (336) [phone] ________________________________  Name: Jeffrey Gutierrez MRN: [id number]  Date: 03/31/2016  DOB: 1956-07-07  SIMULATION AND TREATMENT PLANNING NOTE    ICD-9-CM ICD-10-CM   1. Spine metastasis (HCC) 198.5 C79.51   2. Adenocarcinoma of right lung, stage 4 (HCC) 162.9 C34.91     DIAGNOSIS:  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  NARRATIVE:  The patient was brought to the [facility].  Identity was confirmed.  All relevant records and images related to the planned course of therapy were reviewed.  The patient freely provided informed written consent to proceed with treatment after reviewing the details related to the planned course of therapy. The consent form was witnessed and verified by the simulation staff.  Then, the patient was set-up in a stable reproducible  supine position for radiation therapy.  CT images were obtained.  Surface markings were placed.  The CT images were loaded into the planning software.  Then the target and avoidance structures were contoured.  Treatment planning then occurred.  The radiation prescription was entered and confirmed.  Then, I designed and supervised the construction of a total of 3 medically necessary complex treatment devices.  I have requested : immobilization cradle and 2 MLC's.   PLAN:  The patient will receive 30 Gy in 10 fraction.  ________________________________  Sheral Apley Tammi Klippel, M.D.  This document serves as a record of services personally performed by Tyler Pita, MD. It was created on his behalf by Arlyce Harman, a trained medical scribe. The creation of this record is based on the scribe's personal observations and the provider's statements to them. This document has been checked and approved by the attending provider.

## 2016-03-31 NOTE — Progress Notes (Signed)
Histology and Location of Primary Cancer: adenocarcinoma lung  Sites of Visceral and Bony Metastatic Disease: spine  Location(s) of Symptomatic Metastases: mid spine just below shoulder blades  Past/Anticipated chemotherapy by medical oncology, if any: looking into Keytruda  Pain on a scale of 0-10 is: 7    If Spine Met(s), symptoms, if any, include:  Bowel/Bladder retention or incontinence (please describe): no  Numbness or weakness in extremities (please describe): no  Current Decadron regimen, if applicable: no  Ambulatory status? Walker? Wheelchair?: Ambulatory  SAFETY ISSUES:  Prior radiation? no  Pacemaker/ICD? no  Possible current pregnancy? no  Is the patient on methotrexate? no  Current Complaints / other details:  60 year old male. Married.   Reports constant mid back pain just below his shoulder blades 7 on a scale of 0-10. Was managing pain with Tramadol and Tylenol until yesterday when he was prescribed Dilaudid 4 mg, 1-2 tabs every 4 hours. Reports taking Miralax bid to prevent constipation. Reports daily bowel movements. Denies any recent falls. Steady gait noted. Reports a productive cough with clear to green sputum. Reports SOB with mild exertion. Denies headache, dizziness, nausea or vomiting. Scheduled to follow up with Dr. Whitney Muse on 04/13/16.  BP 160/83 mmHg  Pulse 90  Temp(Src) 98.6 F (37 C) (Oral)  Resp 16  Wt 142 lb 6.4 oz (64.592 kg)  SpO2 100% Wt Readings from Last 3 Encounters:  03/31/16 142 lb 6.4 oz (64.592 kg)  03/30/16 144 lb 1.6 oz (65.363 kg)  03/24/16 148 lb (67.132 kg)

## 2016-03-31 NOTE — Progress Notes (Signed)
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 Spine metastasis (HCC) 198.5 C79.51   2. Metastasis to bone (HCC) 198.5 C79.51   3. Adenocarcinoma of right lung, stage 4 (HCC) 162.9 C34.91     HISTORY OF PRESENT ILLNESS: Jeffrey Gutierrez is a 60 y.o. male seen at the request of Dr. Whitney Muse. The patient presented with weight loss and back pain. His PCP, Dr Hilma Favors, obtained an MRI Spine which prompted a referral to Dr. Luan Pulling. On 03/13/2016 PET scan revealed evidence of trans pleural spread of tumor overlying the right hemi thorax and multiple hypermetabolic pleural base lesions. Many of these had associated destructive changes involving the adjacent bony structures. Also noted were hypermetabolic mediastinal and right internal mammary lymph node metastases. 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 He has plans to undergo MRI of the brain. His pathology is being sent off for molecular testing, and genetic testing is also pending. He is hopeful to begin immunotherapy if appropriate under the care of Dr. Whitney Muse, and comes today to discuss his options for palliative radiotherapy.  PREVIOUS RADIATION THERAPY: No  PAST MEDICAL HISTORY:  Past Medical History  Diagnosis Date  . PBC (primary biliary cirrhosis) ([place])     Diagnosed 2006  . S/P colonoscopy 2009    Pancolonic diverticula, hyperplastic rectal polyps  . Hyperplastic rectal polyp   . Diverticulitis   . COPD (chronic obstructive pulmonary disease) ([place])   . Essential hypertension   . GERD (gastroesophageal reflux  disease)   . Hiatal hernia   . Lung cancer ([place])   . Bone cancer ([place])       PAST SURGICAL HISTORY: Past Surgical History  Procedure Laterality Date  . Cholecystectomy    . Liver biopsy    . Nasal septum surgery    . Arm surgery Left     ulnar nerve release  . Colonoscopy  2009    Pancolonic diverticula, hyperplastic rectal polyps  . Colonoscopy [name] 03/24/2015    RMR: Single colonic polyp removed as described above. Pancolonic diverticulosis. Abnormal sigmoid colon consistent with stigmata of recent diverticulitis.   . Esophagogastroduodenoscopy [name] 06/28/2015    RMR: mild erosive reflux/small HH  . Inguinal hernia repair Right Feb 2012    Dr. Arnoldo Morale   . Inguinal hernia repair Left 11/12/2015    Procedure: HERNIA REPAIR INGUINAL ADULT WITH MESH;  Surgeon: Aviva Signs, MD;  Location: [facility];  Service: General;  Laterality: Left;  . Insertion of mesh Left 11/12/2015    Procedure: INSERTION OF MESH;  Surgeon: Aviva Signs, MD;  Location: [facility];  Service: General;  Laterality: Left;    FAMILY HISTORY:  Family History  Problem Relation Age of Onset  . Pneumonia Father   . Lung cancer Mother   . Lung cancer Father   . Pancreatitis Father     SOCIAL HISTORY:  Social History   Social History  . Marital Status: Married    Spouse Name: [name]  . Number of Children: [name]  . Years of Education: [name]   Occupational History  .  Not on file.   Social History Main Topics  . Smoking status: Current Some Day Smoker -- 0.50 packs/day for 43 years    Types: Cigarettes  . Smokeless tobacco: Never Used  . Alcohol Use: 0.0 oz/week    0 Standard drinks or equivalent per week     Comment: occassional  . Drug Use: No  . Sexual Activity: Yes   Other Topics Concern  . Not on file   Social History Narrative    ALLERGIES: Dihydrocodeine; Hydrocodone; Doxycycline; and Linzess  MEDICATIONS:  Current Outpatient Prescriptions  Medication Sig Dispense Refill  . acetaminophen (TYLENOL) 500  MG tablet Take 1,000 mg by mouth every 8 (eight) hours as needed for moderate pain or headache.     . albuterol (PROVENTIL HFA;VENTOLIN HFA) 108 (90 Base) MCG/ACT inhaler Inhale 2 puffs into the lungs every 4 (four) hours as needed for wheezing or shortness of breath. 1 Inhaler 0  . budesonide-formoterol (SYMBICORT) 160-4.5 MCG/ACT inhaler Inhale 2 puffs into the lungs 2 (two) times daily.    . DULoxetine (CYMBALTA) 30 MG capsule     . gabapentin (NEURONTIN) 300 MG capsule     . HYDROmorphone (DILAUDID) 4 MG tablet Take 1 to 2 tablets by mouth every 4 hours as needed for pain 90 tablet 0  . ibuprofen (ADVIL,MOTRIN) 200 MG tablet Take 200 mg by mouth every 6 (six) hours as needed for mild pain or moderate pain. Reported on 12/14/2015    . lactulose (CHRONULAC) 10 GM/15ML solution     . LINZESS 72 MCG capsule TAKE 1 CAPSULE (72 MCG TOTAL) BY MOUTH DAILY BEFORE BREAKFAST.  5  . OCALIVA 5 MG TABS Take 1 tablet by mouth daily. (Patient taking differently: Take 5 mg by mouth daily. ) 90 tablet 3  . omeprazole (PRILOSEC) 20 MG capsule Take 1 capsule (20 mg total) by mouth daily. 30 capsule 5  . polyethylene glycol (MIRALAX / GLYCOLAX) packet Take 34 g by mouth 2 (two) times daily.     Marland Kitchen tiotropium (SPIRIVA) 18 MCG inhalation capsule Place 18 mcg into inhaler and inhale daily.    . traMADol (ULTRAM) 50 MG tablet Take 50-100 mg by mouth 4 (four) times daily.     . [DISCONTINUED] ipratropium (ATROVENT HFA) 17 MCG/ACT inhaler Inhale 2 puffs into the lungs every 6 (six) hours as needed for wheezing. (Patient not taking: Reported on 08/03/2015) 1 Inhaler 12  . [DISCONTINUED] pantoprazole (PROTONIX) 40 MG tablet Take 1 tablet (40 mg total) by mouth daily. (Patient not taking: Reported on 08/03/2015) 90 tablet 3   No current facility-administered medications for this encounter.    REVIEW OF SYSTEMS:  On review of systems, the patient reports that he is doing moderately well overall. He denies any chest pain,  fevers, chills, night sweats, unintended weight changes. He denies any bowel or bladder disturbances, and denies abdominal pain, nausea or vomiting. He denies any new musculoskeletal or joint aches. He denies headaches, falls, or dizziness. Reports constant mid back pain just below his shoulder blades, 7 on a scale of 0-10. He reports taking Miralax to avoid constipation. Reports a productive cough with clear to green sputum. Reports shortness of breath with mild exertion. A complete review of systems is obtained and is otherwise negative.    PHYSICAL EXAM:  weight is 142 lb 6.4 oz (64.592 kg). His oral temperature is 98.6 F (37 C). His blood pressure is 160/83 and his pulse is 90. His respiration is 16 and oxygen saturation  is 100%.   Pain Scale 7/10 In general this is a well appearing caucasian male in no acute distress. He is alert and oriented x4 and appropriate throughout the examination. HEENT reveals that the patient is normocephalic, atraumatic. EOMs are intact. PERRLA. Skin is intact without any evidence of gross lesions. Cardiovascular exam reveals a regular rate and rhythm, no clicks rubs or murmurs are auscultated. Chest is clear to auscultation bilaterally. Lymphatic assessment is performed and does not reveal any adenopathy in the cervical, supraclavicular, axillary, or inguinal chains. Abdomen has active bowel sounds in all quadrants and is intact. The abdomen is soft, non tender, non distended. Lower extremities are negative for pretibial pitting edema, deep calf tenderness, cyanosis or clubbing.   KPS = 80  100 - Normal; no complaints; no evidence of disease. 90   - Able to carry on normal activity; minor signs or symptoms of disease. 80   - Normal activity with effort; some signs or symptoms of disease. 32   - Cares for self; unable to carry on normal activity or to do active work. 60   - Requires occasional assistance, but is able to care for most of his personal needs. 50   -  Requires considerable assistance and frequent medical care. 46   - Disabled; requires special care and assistance. 7   - Severely disabled; hospital admission is indicated although death not imminent. 24   - Very sick; hospital admission necessary; active supportive treatment necessary. 10   - Moribund; fatal processes progressing rapidly. 0     - Dead  Karnofsky DA, [facility], Craver LS and [facility] [phone]) The use of the nitrogen mustards in the palliative treatment of carcinoma: with particular reference to bronchogenic carcinoma Cancer 1 634-56  LABORATORY DATA:  Lab Results  Component Value Date   WBC 5.1 03/24/2016   HGB 14.8 03/24/2016   HCT 44.1 03/24/2016   MCV 87.5 03/24/2016   PLT 204 03/24/2016   Lab Results  Component Value Date   NA 132* 03/24/2016   K 4.0 03/24/2016   CL 96* 03/24/2016   CO2 29 03/24/2016   Lab Results  Component Value Date   ALT 15* 12/19/2015   AST 23 12/19/2015   ALKPHOS 97 12/19/2015   BILITOT 0.4 12/19/2015     RADIOGRAPHY: Nm Pet Image Initial (pi) Skull Base To Thigh  03/13/2016  CLINICAL DATA:  Initial treatment strategy for lung cancer. EXAM: NUCLEAR MEDICINE PET SKULL BASE TO THIGH TECHNIQUE: 8.0 mCi F-18 FDG was injected intravenously. Full-ring PET imaging was performed from the skull base to thigh after the radiotracer. CT data was obtained and used for attenuation correction and anatomic localization. FASTING BLOOD GLUCOSE:  Value: 104 mg/dl COMPARISON:  None. FINDINGS: NECK No hypermetabolic lymph nodes in the neck. CHEST Normal heart size. There is aortic atherosclerosis noted. Calcification in the LAD coronary artery noted. Multiple enlarged and hypermetabolic mediastinal lymph nodes are identified. Index right paratracheal lymph node measures 1.4 cm and has an SUV max equal to 6.6 image 76 of series 4. Index sub- carinal lymph node measures 1.8 cm and has an SUV max equal to 12.8, image 87 of series 4. Index posterior  mediastinal lymph node measures 1.3 cm and has an SUV max equal to 9.3, image 97 of series 4. Hypermetabolic right internal mammary lymph node measures 1.6 cm and has an SUV max equal to 6.99, image 101 of series 4. There are numerous pleural based lesions overlying the right  lung which exhibit intense FDG uptake. Many of these involve the a J osseous structures. Index lesion overlying the medial left upper lobe measures approximately 2.1 x 2.5 cm and has an SUV max equal to 12.58. There is associated lytic and sclerotic changes involving the right side of the T5 vertebra. Lesion overlying the anterior lateral aspect of the right middle lobe measures 2 x 0.8 cm and has an SUV max equal to 10.46. Associated lytic changes are noted involving the overlying right seventh rib, image 113 of series 4. ABDOMEN/PELVIS No abnormal hypermetabolic activity within the liver, pancreas, adrenal glands, or spleen. No hypermetabolic lymph nodes in the abdomen or pelvis. SKELETON No focal hypermetabolic activity to suggest skeletal metastasis. IMPRESSION: 1. 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 Multiple hypermetabolic pleural base lesions are identified. Many of these have associated destructive changes involving the adjacent bony structures. 2. 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 Electronically Signed   By: Kerby Moors M.D.   On: 03/13/2016 14:07   Ct Biopsy  03/24/2016  INDICATION: Thoracic adenopathy EXAM: CT BIOPSY MEDICATIONS: None. ANESTHESIA/SEDATION: Fentanyl 50 mcg IV; Versed 1 mg IV Moderate Sedation Time:  15 The patient was continuously monitored during the procedure by the interventional radiology nurse under my direct supervision. FLUOROSCOPY TIME:  Fluoroscopy Time:  minutes  seconds ( mGy). COMPLICATIONS: None immediate. PROCEDURE: Informed written consent was obtained from the patient after a thorough discussion of the  procedural risks, benefits and alternatives. All questions were addressed. Maximal Sterile Barrier Technique was utilized including caps, mask, sterile gowns, sterile gloves, sterile drape, hand hygiene and skin antiseptic. A timeout was performed prior to the initiation of the procedure. Under CT guidance, a(n) 17 gauge guide needle was advanced into the right internal mammary lymph node. Subsequently 3 18 gauge core biopsies were obtained. The guide needle was removed. Post biopsy images demonstrate no pneumothorax. Patient tolerated the procedure well without complication. Vital sign monitoring by nursing staff during the procedure will continue as patient is in the special procedures unit for post procedure observation. FINDINGS: The images document guide needle placement within the right internal mammary lymph node. Post biopsy images demonstrate no pneumothorax. IMPRESSION: Successful CT-guided right internal mammary lymph node biopsy. Electronically Signed   By: Marybelle Killings M.D.   On: 03/24/2016 15:51   Dg Chest Port 1 View  03/24/2016  CLINICAL DATA:  Status post lymph node biopsy. EXAM: PORTABLE CHEST 1 VIEW COMPARISON:  01/05/2016 FINDINGS: Emphysema without visible pneumothorax. No signs of parenchymal or mediastinal hemorrhage. Subtle right-sided parenchymal nodularity as seen on recent chest CT. Normal heart size and aortic contours. No acute osseous finding. IMPRESSION: No acute finding after biopsy. Emphysema. ([id number]) Electronically Signed   By: Monte Fantasia M.D.   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 He would be a good candidate for palliative radiation.  PLAN: Dr. Tammi Klippel spoke with the patient and family about the findings and work-up thus far.  We discussed the natural history of painful bone metastases and general treatment,  highlighting the role of radiotherapy in the management.  We discussed the available radiation techniques, and focused on the details of logistics and delivery.  We reviewed the anticipated acute and late sequelae associated with radiation in this setting.  The patient  was encouraged to ask questions which were answered to his satisfaction at the conclusion of the visit.    The above documentation reflects my direct findings during this shared patient visit. Please see the separate note by Dr. Tammi Klippel on this date for the remainder of the patient's plan of care.   Carola Rhine, PAC     This document serves as a record of services personally performed by Tyler Pita, MD and Shona Simpson, PAC. It was created on his behalf by Arlyce Harman, a trained medical scribe. The creation of this record is based on the scribe's personal observations and the provider's statements to them. This document has been checked and approved by the attending provider.

## 2016-03-31 NOTE — Progress Notes (Signed)
See progress note under physician encounter. 

## 2016-04-01 DIAGNOSIS — C7951 Secondary malignant neoplasm of bone: Secondary | ICD-10-CM | POA: Insufficient documentation

## 2016-04-01 DIAGNOSIS — C3491 Malignant neoplasm of unspecified part of right bronchus or lung: Secondary | ICD-10-CM | POA: Insufficient documentation

## 2016-04-03 ENCOUNTER — Other Ambulatory Visit (HOSPITAL_COMMUNITY): Payer: Self-pay | Admitting: Emergency Medicine

## 2016-04-03 MED ORDER — ONDANSETRON HCL 8 MG PO TABS: 8.0000 mg | ORAL_TABLET | Freq: Three times a day (TID) | ORAL | Status: DC | PRN

## 2016-04-03 MED ORDER — MORPHINE SULFATE 15 MG PO TABS: 15.0000 mg | ORAL_TABLET | ORAL | Status: DC | PRN

## 2016-04-03 MED ORDER — NYSTATIN 100000 UNIT/ML MT SUSP
5.0000 mL | Freq: Four times a day (QID) | OROMUCOSAL | Status: DC
Start: 2016-04-03 — End: 2016-04-29

## 2016-04-03 NOTE — Progress Notes (Unsigned)
  Pt wants to know if he can get second opinion.  Also the dilaudid that we started him on last week has made him very sick.  Throwing up and made his throat feel like it was closing up.  Spoke with Dr Whitney Muse, and he can have a second opinion wherever he would like. Pt is going to see about getting a second opinon at [facility].  Morphine (he only wanted 15 tablets to try) prescription written for him, also zofran called into his pharmacy.  He states that he has thrush and was suppose to receive a script for that last week as well, medication called in for that as well.

## 2016-04-04 NOTE — Addendum Note (Signed)
Encounter addended by: Heywood Footman, RN on: 04/04/2016 11:08 AM<BR>     Documentation filed: Charges VN

## 2016-04-05 ENCOUNTER — Other Ambulatory Visit (HOSPITAL_COMMUNITY): Payer: Self-pay | Admitting: Emergency Medicine

## 2016-04-05 ENCOUNTER — Other Ambulatory Visit: Payer: Self-pay | Admitting: Radiology

## 2016-04-06 ENCOUNTER — Encounter (HOSPITAL_COMMUNITY): Payer: Self-pay

## 2016-04-06 ENCOUNTER — Ambulatory Visit (HOSPITAL_COMMUNITY)
Admission: RE | Admit: 2016-04-06 | Discharge: 2016-04-06 | Disposition: A | Discharge status: Home / Self Care | Payer: 59 | Source: Ambulatory Visit | Attending: Hematology & Oncology | Admitting: Hematology & Oncology

## 2016-04-06 ENCOUNTER — Other Ambulatory Visit (HOSPITAL_COMMUNITY): Payer: Self-pay | Admitting: Hematology & Oncology

## 2016-04-06 DIAGNOSIS — C3490 Malignant neoplasm of unspecified part of unspecified bronchus or lung: Secondary | ICD-10-CM | POA: Insufficient documentation

## 2016-04-06 DIAGNOSIS — J449 Chronic obstructive pulmonary disease, unspecified: Secondary | ICD-10-CM | POA: Diagnosis not present

## 2016-04-06 DIAGNOSIS — Z7951 Long term (current) use of inhaled steroids: Secondary | ICD-10-CM | POA: Diagnosis not present

## 2016-04-06 DIAGNOSIS — I10 Essential (primary) hypertension: Secondary | ICD-10-CM | POA: Insufficient documentation

## 2016-04-06 DIAGNOSIS — Z79899 Other long term (current) drug therapy: Secondary | ICD-10-CM | POA: Diagnosis not present

## 2016-04-06 DIAGNOSIS — C3491 Malignant neoplasm of unspecified part of right bronchus or lung: Secondary | ICD-10-CM

## 2016-04-06 DIAGNOSIS — F17210 Nicotine dependence, cigarettes, uncomplicated: Secondary | ICD-10-CM | POA: Diagnosis not present

## 2016-04-06 DIAGNOSIS — K219 Gastro-esophageal reflux disease without esophagitis: Secondary | ICD-10-CM | POA: Diagnosis not present

## 2016-04-06 LAB — CBC WITH DIFFERENTIAL/PLATELET
Basophils Absolute: 0 10*3/uL (ref 0.0–0.1)
Basophils Relative: 0 %
EOS ABS: 0 10*3/uL (ref 0.0–0.7)
Eosinophils Relative: 1 %
HEMATOCRIT: 40 % (ref 39.0–52.0)
HEMOGLOBIN: 13.5 g/dL (ref 13.0–17.0)
LYMPHS ABS: 1.1 10*3/uL (ref 0.7–4.0)
Lymphocytes Relative: 21 %
MCH: 29 pg (ref 26.0–34.0)
MCHC: 33.8 g/dL (ref 30.0–36.0)
MCV: 86 fL (ref 78.0–100.0)
MONOS PCT: 11 %
Monocytes Absolute: 0.6 10*3/uL (ref 0.1–1.0)
NEUTROS ABS: 3.6 10*3/uL (ref 1.7–7.7)
NEUTROS PCT: 67 %
Platelets: 195 10*3/uL (ref 150–400)
RBC: 4.65 MIL/uL (ref 4.22–5.81)
RDW: 14.3 % (ref 11.5–15.5)
WBC: 5.4 10*3/uL (ref 4.0–10.5)

## 2016-04-06 LAB — PROTIME-INR
INR: 1.11 (ref 0.00–1.49)
Prothrombin Time: 14.5 seconds (ref 11.6–15.2)

## 2016-04-06 LAB — APTT: aPTT: 32 seconds (ref 24–37)

## 2016-04-06 MED ORDER — SODIUM CHLORIDE 0.9 % IV SOLN
INTRAVENOUS | Status: DC
  Administered 2016-04-06: 12:00:00 via INTRAVENOUS

## 2016-04-06 MED ORDER — HEPARIN SOD (PORK) LOCK FLUSH 100 UNIT/ML IV SOLN
INTRAVENOUS | Status: AC
  Filled 2016-04-06: qty 5

## 2016-04-06 MED ORDER — LIDOCAINE HCL 1 % IJ SOLN
INTRAMUSCULAR | Status: AC
  Filled 2016-04-06: qty 20

## 2016-04-06 MED ORDER — CEFAZOLIN SODIUM-DEXTROSE 2-4 GM/100ML-% IV SOLN
2.0000 g | Freq: Once | INTRAVENOUS | Status: AC
  Administered 2016-04-06: 2 g via INTRAVENOUS
  Filled 2016-04-06: qty 100

## 2016-04-06 MED ORDER — LIDOCAINE HCL 1 % IJ SOLN
INTRAMUSCULAR | Status: DC | PRN
  Administered 2016-04-06: 15 mL

## 2016-04-06 MED ORDER — MIDAZOLAM HCL 2 MG/2ML IJ SOLN
INTRAMUSCULAR | Status: AC
  Filled 2016-04-06: qty 6

## 2016-04-06 MED ORDER — HEPARIN SOD (PORK) LOCK FLUSH 100 UNIT/ML IV SOLN
INTRAVENOUS | Status: AC | PRN
  Administered 2016-04-06: 500 [IU]

## 2016-04-06 MED ORDER — MIDAZOLAM HCL 2 MG/2ML IJ SOLN
INTRAMUSCULAR | Status: AC | PRN
  Administered 2016-04-06 (×2): 1 mg via INTRAVENOUS
  Administered 2016-04-06: 0.5 mg via INTRAVENOUS

## 2016-04-06 MED ORDER — FENTANYL CITRATE (PF) 100 MCG/2ML IJ SOLN
INTRAMUSCULAR | Status: AC | PRN
  Administered 2016-04-06: 50 ug via INTRAVENOUS

## 2016-04-06 MED ORDER — FENTANYL CITRATE (PF) 100 MCG/2ML IJ SOLN
INTRAMUSCULAR | Status: AC
  Filled 2016-04-06: qty 4

## 2016-04-06 NOTE — H&P (Signed)
Chief Complaint: Patient was seen in consultation today for port placement at the request of Dr. Ancil Linsey  Referring Physician(s): Dr. Ancil Linsey  Supervising Physician: Marybelle Killings  Patient Status: Outpatient  History of Present Illness: Jeffrey Gutierrez is a 60 y.o. male with new diagnosis of lung cancer. He recently underwent successful biopsy which proved diagnostic. He is referred for placement of port. PMHx, meds, labs, imaging,allergies reviewed. Has been NPO today Wife at bedside. Feels well.   Past Medical History  Diagnosis Date  . 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 S/P colonoscopy 2009    Pancolonic diverticula, hyperplastic rectal polyps  . Hyperplastic rectal polyp   . Diverticulitis   . COPD (chronic obstructive pulmonary disease) ([place])   . Essential hypertension   . GERD (gastroesophageal reflux disease)   . Hiatal hernia   . Lung cancer ([place])   . Bone cancer [facility])     Past Surgical History  Procedure Laterality Date  . Cholecystectomy    . Liver biopsy    . Nasal septum surgery    . Arm surgery Left     ulnar nerve release  . Colonoscopy  2009    Pancolonic diverticula, hyperplastic rectal polyps  . Colonoscopy [name] 03/24/2015    RMR: Single colonic polyp removed as described above. Pancolonic diverticulosis. Abnormal sigmoid colon consistent with stigmata of recent diverticulitis.   . Esophagogastroduodenoscopy [name] 06/28/2015    RMR: mild erosive reflux/small HH  . Inguinal hernia repair Right Feb 2012    Dr. Arnoldo Morale   . Inguinal hernia repair Left 11/12/2015    Procedure: HERNIA REPAIR INGUINAL ADULT WITH MESH;  Surgeon: Aviva Signs, MD;  Location: [facility];  Service: General;  Laterality: Left;  . Insertion of mesh Left 11/12/2015    Procedure: INSERTION OF MESH;  Surgeon: Aviva Signs, MD;  Location: [facility];  Service: General;  Laterality: Left;    Allergies: Dihydrocodeine; Hydrocodone; Doxycycline; and  Linzess  Medications: Prior to Admission medications   Medication Sig Start Date End Date Taking? Authorizing Provider  acetaminophen (TYLENOL) 500 MG tablet Take 1,000 mg by mouth every 8 (eight) hours as needed for moderate pain or headache.    Yes Historical Provider, MD  albuterol (PROVENTIL HFA;VENTOLIN HFA) 108 (90 Base) MCG/ACT inhaler Inhale 2 puffs into the lungs every 4 (four) hours as needed for wheezing or shortness of breath. 09/17/15  Yes Merrily Pew, MD  budesonide-formoterol [facility]) 160-4.5 MCG/ACT inhaler Inhale 2 puffs into the lungs 2 (two) times daily.   Yes Historical Provider, MD  DULoxetine (CYMBALTA) 30 MG capsule Take 30 mg by mouth daily.  02/23/16  Yes Historical Provider, MD  Multiple Vitamin (MULTIVITAMIN WITH MINERALS) TABS tablet Take 1 tablet by mouth daily.   Yes Historical Provider, MD  OCALIVA 5 MG TABS Take 1 tablet by mouth daily. Patient taking differently: Take 5 mg by mouth daily.  03/02/16  Yes Orvil Feil, NP  omeprazole (PRILOSEC) 20 MG capsule Take 1 capsule (20 mg total) by mouth daily. 01/24/16  Yes Mahala Menghini, PA-C  polyethylene glycol (MIRALAX / GLYCOLAX) packet Take 34 g by mouth 2 (two) times daily.    Yes Historical Provider, MD  tiotropium (SPIRIVA) 18 MCG inhalation capsule Place 18 mcg into inhaler and inhale daily.   Yes Historical Provider, MD  traMADol (ULTRAM) 50 MG tablet Take 50-100 mg by mouth 4 (four) times daily.  02/03/16  Yes Historical Provider,  MD  ibuprofen (ADVIL,MOTRIN) 200 MG tablet Take 200 mg by mouth every 6 (six) hours as needed for mild pain or moderate pain. Reported on 12/14/2015    Historical Provider, MD  lisinopril-hydrochlorothiazide (PRINZIDE,ZESTORETIC) 20-12.5 MG tablet Take 1 tablet by mouth daily as needed (high blood pressure).  06/23/15   Historical Provider, MD     Family History  Problem Relation Age of Onset  . Pneumonia Father   . Lung cancer Mother   . Lung cancer Father   . Pancreatitis Father      Social History   Social History  . Marital Status: Married    Spouse Name: [name]  . Number of Children: [name]  . Years of Education: [name]   Social History Main Topics  . Smoking status: Current Some Day Smoker -- 0.50 packs/day for 43 years    Types: Cigarettes  . Smokeless tobacco: Never Used  . Alcohol Use: 0.0 oz/week    0 Standard drinks or equivalent per week     Comment: occassional  . Drug Use: No  . Sexual Activity: Yes   Other Topics Concern  . None   Social History Narrative     Review of Systems: A 12 point ROS discussed and pertinent positives are indicated in the HPI above.  All other systems are negative.  Review of Systems  Constitutional: Positive for activity change, fatigue and unexpected weight change. Negative for fever.  Respiratory: Negative for cough and shortness of breath.   Gastrointestinal: Positive for abdominal pain and constipation.  Musculoskeletal: Positive for back pain.  Neurological: Positive for weakness.  Psychiatric/Behavioral: Negative for behavioral problems and confusion.    Vital Signs: BP 151/76 mmHg  Pulse 95  Temp(Src) 99.2 F (37.3 C) (Oral)  Resp 18  Ht '5\' 10"'$  (1.778 m)  Wt 142 lb 6.4 oz (64.592 kg)  BMI 20.43 kg/m2  SpO2 95%  Physical Exam  Constitutional: He is oriented to person, place, and time.  Cardiovascular: Normal rate, regular rhythm and normal heart sounds.   Pulmonary/Chest: He has wheezes.  Abdominal: Soft. Bowel sounds are normal. There is no tenderness.  Musculoskeletal: Normal range of motion.  Neurological: He is alert and oriented to person, place, and time.  Skin: Skin is warm and dry.  Psychiatric: He has a normal mood and affect. His behavior is normal. Judgment and thought content normal.  Nursing note and vitals reviewed.   Mallampati Score:  MD Evaluation Airway: WNL Heart: WNL Abdomen: WNL Chest/ Lungs: WNL ASA  Classification: 3 Mallampati/Airway Score:  One    Labs:  CBC:  Recent Labs  12/14/15 0746 12/19/15 1125 03/24/16 0945 04/06/16 1212  WBC 4.7 4.9 5.1 5.4  HGB 15.3 14.5 14.8 13.5  HCT 44.9 42.8 44.1 40.0  PLT 223 182 204 195    COAGS:  Recent Labs  07/31/15 1014 08/04/15 0948 03/24/16 0945 04/06/16 1212  INR CANCELED 1.00 1.15 1.11  APTT  --   --   --  32    BMP:  Recent Labs  08/03/15 1607 11/11/15 0820 12/14/15 0746 12/19/15 1125 03/24/16 0945  NA 137 134* 134* 134* 132*  K 4.0 4.3 4.7 4.4 4.0  CL 101 99* 95* 100* 96*  CO2 '31 29 30 28 29  '$ GLUCOSE 116* 143* 108* 105* 103*  BUN '10 8 8 7 6  '$ CALCIUM 9.0 8.6* 9.4 8.6* 9.2  CREATININE 0.77 0.82 0.78 0.71 0.72  GFRNONAA >60 >60  --  >60 >60  GFRAA >60 >60  --  >  60 >60    LIVER FUNCTION TESTS:  Recent Labs  07/31/15 1014 08/03/15 1607 12/14/15 0746 12/19/15 1125  BILITOT 0.6 0.5 0.6 0.4  AST '25 27 21 23  '$ ALT '23 19 15 '$ 15*  ALKPHOS 83 73 112 97  PROT 7.4 7.6 7.7 7.8  ALBUMIN 3.6 3.4* 3.6 3.4*    TUMOR MARKERS:  Recent Labs  07/31/15 1014  AFPTM 4.2    Assessment and Plan: Adenocarcinoma of the lung To start chemotherapy soon. Plan for port placement today Labs ok Risks and Benefits discussed with the patient including, but not limited to bleeding, infection, pneumothorax, or fibrin sheath development and need for additional procedures. All of the patient's questions were answered, patient is agreeable to proceed. Consent signed and in chart.     Thank you for this interesting consult.  I greatly enjoyed meeting Jeffrey Gutierrez and look forward to participating in their care.  A copy of this report was sent to the requesting provider on this date.  Electronically Signed: Ascencion Dike 04/06/2016, 12:57 PM   I spent a total of 20 minutes in face to face in clinical consultation, greater than 50% of which was counseling/coordinating care for port placement

## 2016-04-06 NOTE — Discharge Instructions (Signed)
Implanted Port Home Guide °An implanted port is a type of central line that is placed under the skin. Central lines are used to provide IV access when treatment or nutrition needs to be given through a person's veins. Implanted ports are used for long-term IV access. An implanted port may be placed because:  °· You need IV medicine that would be irritating to the small veins in your hands or arms.   °· You need long-term IV medicines, such as antibiotics.   °· You need IV nutrition for a long period.   °· You need frequent blood draws for lab tests.   °· You need dialysis.   °Implanted ports are usually placed in the chest area, but they can also be placed in the upper arm, the abdomen, or the leg. An implanted port has two main parts:  °· Reservoir. The reservoir is round and will appear as a small, raised area under your skin. The reservoir is the part where a needle is inserted to give medicines or draw blood.   °· Catheter. The catheter is a thin, flexible tube that extends from the reservoir. The catheter is placed into a large vein. Medicine that is inserted into the reservoir goes into the catheter and then into the vein.   °HOW WILL I CARE FOR MY INCISION SITE? °Do not get the incision site wet. Bathe or shower as directed by your health care provider.  °HOW IS MY PORT ACCESSED? °Special steps must be taken to access the port:  °· Before the port is accessed, a numbing cream can be placed on the skin. This helps numb the skin over the port site.   °· Your health care provider uses a sterile technique to access the port. °· Your health care provider must put on a mask and sterile gloves. °· The skin over your port is cleaned carefully with an antiseptic and allowed to dry. °· The port is gently pinched between sterile gloves, and a needle is inserted into the port. °· Only "non-coring" port needles should be used to access the port. Once the port is accessed, a blood return should be checked. This helps  ensure that the port is in the vein and is not clogged.   °· If your port needs to remain accessed for a constant infusion, a clear (transparent) bandage will be placed over the needle site. The bandage and needle will need to be changed every week, or as directed by your health care provider.   °· Keep the bandage covering the needle clean and dry. Do not get it wet. Follow your health care provider's instructions on how to take a shower or bath while the port is accessed.   °· If your port does not need to stay accessed, no bandage is needed over the port.   °WHAT IS FLUSHING? °Flushing helps keep the port from getting clogged. Follow your health care provider's instructions on how and when to flush the port. Ports are usually flushed with saline solution or a medicine called heparin. The need for flushing will depend on how the port is used.  °· If the port is used for intermittent medicines or blood draws, the port will need to be flushed:   °· After medicines have been given.   °· After blood has been drawn.   °· As part of routine maintenance.   °· If a constant infusion is running, the port may not need to be flushed.   °HOW LONG WILL MY PORT STAY IMPLANTED? °The port can stay in for as long as your health care   provider thinks it is needed. When it is time for the port to come out, surgery will be done to remove it. The procedure is similar to the one performed when the port was put in.  WHEN SHOULD I SEEK IMMEDIATE MEDICAL CARE? When you have an implanted port, you should seek immediate medical care if:   You notice a bad smell coming from the incision site.   You have swelling, redness, or drainage at the incision site.   You have more swelling or pain at the port site or the surrounding area.   You have a fever that is not controlled with medicine.   This information is not intended to replace advice given to you by your health care provider. Make sure you discuss any questions you have with  your health care provider.   Document Released: 09/04/2005 Document Revised: 06/25/2013 Document Reviewed: 05/12/2013 Elsevier Interactive Patient Education 2016 [place] Insertion, Care After Refer to this sheet in the next few weeks. These instructions provide you with information on caring for yourself after your procedure. Your health care provider may also give you more specific instructions. Your treatment has been planned according to current medical practices, but problems sometimes occur. Call your health care provider if you have any problems or questions after your procedure. WHAT TO EXPECT AFTER THE PROCEDURE After your procedure, it is typical to have the following:   Discomfort at the port insertion site. Ice packs to the area will help.  Bruising on the skin over the port. This will subside in 3-4 days. HOME CARE INSTRUCTIONS  After your port is placed, you will get a manufacturer's information card. The card has information about your port. Keep this card with you at all times.   Know what kind of port you have. There are many types of ports available.   Wear a medical alert bracelet in case of an emergency. This can help alert health care workers that you have a port.   The port can stay in for as long as your health care provider believes it is necessary.   A home health care nurse may give medicines and take care of the port.   You or a family member can get special training and directions for giving medicine and taking care of the port at home.  SEEK MEDICAL CARE IF:   Your port does not flush or you are unable to get a blood return.   You have a fever or chills. SEEK IMMEDIATE MEDICAL CARE IF:  You have new fluid or pus coming from your incision.   You notice a bad smell coming from your incision site.   You have swelling, pain, or more redness at the incision or port site.   You have chest pain or shortness of breath.   This  information is not intended to replace advice given to you by your health care provider. Make sure you discuss any questions you have with your health care provider.   Document Released: 06/25/2013 Document Revised: 09/09/2013 Document Reviewed: 06/25/2013 Elsevier Interactive Patient Education 2016 [employer]   Moderate Conscious Sedation, Adult, Care After Refer to this sheet in the next few weeks. These instructions provide you with information on caring for yourself after your procedure. Your health care provider may also give you more specific instructions. Your treatment has been planned according to current medical practices, but problems sometimes occur. Call your health care provider if you have any problems or questions  after your procedure. WHAT TO EXPECT AFTER THE PROCEDURE  After your procedure:  You may feel sleepy, clumsy, and have poor balance for several hours.  Vomiting may occur if you eat too soon after the procedure. HOME CARE INSTRUCTIONS  Do not participate in any activities where you could become injured for at least 24 hours. Do not:  Drive.  Swim.  Ride a bicycle.  Operate heavy machinery.  Cook.  Use power tools.  Climb ladders.  Work from a high place.  Do not make important decisions or sign legal documents until you are improved.  If you vomit, drink water, juice, or soup when you can drink without vomiting. Make sure you have little or no nausea before eating solid foods.  Only take over-the-counter or prescription medicines for pain, discomfort, or fever as directed by your health care provider.  Make sure you and your family fully understand everything about the medicines given to you, including what side effects may occur.  You should not drink alcohol, take sleeping pills, or take medicines that cause drowsiness for at least 24 hours.  If you smoke, do not smoke without supervision.  If you are feeling better, you may resume normal  activities 24 hours after you were sedated.  Keep all appointments with your health care provider. SEEK MEDICAL CARE IF:  Your skin is pale or bluish in color.  You continue to feel nauseous or vomit.  Your pain is getting worse and is not helped by medicine.  You have bleeding or swelling.  You are still sleepy or feeling clumsy after 24 hours. SEEK IMMEDIATE MEDICAL CARE IF:  You develop a rash.  You have difficulty breathing.  You develop any type of allergic problem.  You have a fever. MAKE SURE YOU:  Understand these instructions.  Will watch your condition.  Will get help right away if you are not doing well or get worse.   This information is not intended to replace advice given to you by your health care provider. Make sure you discuss any questions you have with your health care provider.   Document Released: 06/25/2013 Document Revised: 09/25/2014 Document Reviewed: 06/25/2013 Elsevier Interactive Patient Education [employer].

## 2016-04-06 NOTE — Procedures (Signed)
RIJV PAC SVC RA No comp/EBL

## 2016-04-07 ENCOUNTER — Other Ambulatory Visit (HOSPITAL_COMMUNITY): Payer: Self-pay | Admitting: Emergency Medicine

## 2016-04-07 DIAGNOSIS — Z510 Encounter for antineoplastic radiation therapy: Secondary | ICD-10-CM | POA: Diagnosis not present

## 2016-04-07 MED ORDER — MORPHINE SULFATE 15 MG PO TABS: 15.0000 mg | ORAL_TABLET | ORAL | Status: DC | PRN

## 2016-04-07 NOTE — Progress Notes (Signed)
Pt called and needed a refill on his morphine, refilled, will be ready this afternoon.

## 2016-04-10 ENCOUNTER — Ambulatory Visit
Admission: RE | Admit: 2016-04-10 | Discharge: 2016-04-10 | Disposition: A | Discharge status: Home / Self Care | Payer: 59 | Source: Ambulatory Visit | Attending: Radiation Oncology | Admitting: Radiation Oncology

## 2016-04-10 ENCOUNTER — Other Ambulatory Visit (HOSPITAL_COMMUNITY): Payer: Self-pay | Admitting: Emergency Medicine

## 2016-04-10 DIAGNOSIS — Z510 Encounter for antineoplastic radiation therapy: Secondary | ICD-10-CM | POA: Diagnosis not present

## 2016-04-10 NOTE — Progress Notes (Signed)
Pt called and wanted to change his 7/27 appt because it interfered with his radiation appt.  Sent him to amy to change this appt.  Also wanted to know if he could take  guaifenesin OTC for his mucous.

## 2016-04-11 ENCOUNTER — Ambulatory Visit
Admission: RE | Admit: 2016-04-11 | Discharge: 2016-04-11 | Disposition: A | Discharge status: Home / Self Care | Payer: 59 | Source: Ambulatory Visit | Attending: Radiation Oncology | Admitting: Radiation Oncology

## 2016-04-11 ENCOUNTER — Other Ambulatory Visit (HOSPITAL_COMMUNITY): Payer: Self-pay | Admitting: Emergency Medicine

## 2016-04-11 DIAGNOSIS — Z510 Encounter for antineoplastic radiation therapy: Secondary | ICD-10-CM | POA: Diagnosis not present

## 2016-04-11 NOTE — Progress Notes (Signed)
Called Keisha at pathology at [facility] to add KRAS and ALK.

## 2016-04-12 ENCOUNTER — Ambulatory Visit
Admission: RE | Admit: 2016-04-12 | Discharge: 2016-04-12 | Disposition: A | Discharge status: Home / Self Care | Payer: 59 | Source: Ambulatory Visit | Attending: Radiation Oncology | Admitting: Radiation Oncology

## 2016-04-12 ENCOUNTER — Ambulatory Visit (HOSPITAL_COMMUNITY)
Admission: RE | Admit: 2016-04-12 | Discharge: 2016-04-12 | Disposition: A | Discharge status: Home / Self Care | Payer: 59 | Source: Ambulatory Visit | Attending: Hematology & Oncology | Admitting: Hematology & Oncology

## 2016-04-12 DIAGNOSIS — Z510 Encounter for antineoplastic radiation therapy: Secondary | ICD-10-CM | POA: Diagnosis not present

## 2016-04-12 DIAGNOSIS — C3491 Malignant neoplasm of unspecified part of right bronchus or lung: Secondary | ICD-10-CM

## 2016-04-12 DIAGNOSIS — I739 Peripheral vascular disease, unspecified: Secondary | ICD-10-CM | POA: Diagnosis not present

## 2016-04-12 MED ORDER — GADOBENATE DIMEGLUMINE 529 MG/ML IV SOLN
13.0000 mL | Freq: Once | INTRAVENOUS | Status: AC | PRN
  Administered 2016-04-12: 13 mL via INTRAVENOUS

## 2016-04-12 NOTE — Progress Notes (Signed)
[place]  PROGRESS NOTE  Patient Care Team: Sharilyn Sites, MD as PCP - General (Family Medicine) Daneil Dolin, MD (Gastroenterology)  DIAGNOSIS:  SUMMARY OF ONCOLOGIC HISTORY:   Adenocarcinoma of right lung, stage 4 ([place])   10/22/2015 Imaging    CT abdomen/pelvis with L inguinal hernia which contains large bowel, borderline splenomegaly      01/19/2016 Imaging    CT chest with moderate to advanced emphysema, spiculated RUL lesion measuring 10 mm maximum, multiple additional RUL nodules which are likely fissural LN subcarinal adenopathy      03/07/2016 Imaging    MRI thoracic spine with findings at T5 and T8 suggesting metastatic disease, possibly from a lung carcinoma.       03/13/2016 PET scan    Evidence of trans pleural spread of tumor overlying the R hemi thorax, multiple hypermet pleural base lesions are identified. many of these have associated destructive changes involving the adjacent bony structures. hypermet  med and R int mamm LN mets      03/24/2016 Initial Biopsy    CT guided biopsy R internal mammary LN      03/24/2016 Pathology Results    Adenocarcinoma, differential includes primary lung      04/05/2016 Pathology Results    PDL1 negative- TPS 0%.       CURRENT THERAPY: Palliative XRT  INTERVAL HISTORY: Jeffrey Gutierrez 60 y.o. male returns for Stage IV adenocarcinoma of Lung, unfortunately EGFR, ALK and PDL1 negative. He is undergoing palliative XRT at [facility] for painful spinal metastases.  I had offered to refer him to Dr. Aniceto Boss on several occasions for second opinion but they are insistent on [facility]. I advised them that I no longer know anyone at [facility] in regards to lung cancer. I have recommended Carboplatin/Alimta frontline.  Port has been placed.  Although initially reluctant to take morphine for pain, he is on MS contin and immediate release morphine. Pain he notes is better controlled.  His appetite is marginal. We have discussed  nutritional supplements such as boost/ensure. He will need nutrition consultation with nathan.    is allergic to dihydrocodeine; hydrocodone; doxycycline; and linzess [linaclotide].  MEDICAL HISTORY: Past Medical History:  Diagnosis Date  . Bone cancer ([place])   . COPD (chronic obstructive pulmonary disease) ([place])   . Diverticulitis   . Essential hypertension   . GERD (gastroesophageal reflux disease)   . Hiatal hernia   . Hyperplastic rectal polyp   . Lung cancer ([place])   . 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 S/P colonoscopy 2009   Pancolonic diverticula, hyperplastic rectal polyps    SURGICAL HISTORY: Past Surgical History:  Procedure Laterality Date  . Arm Surgery Left    ulnar nerve release  . CHOLECYSTECTOMY    . COLONOSCOPY  2009   Pancolonic diverticula, hyperplastic rectal polyps  . COLONOSCOPY [name] 03/24/2015   RMR: Single colonic polyp removed as described above. Pancolonic diverticulosis. Abnormal sigmoid colon consistent with stigmata of recent diverticulitis.   Marland Kitchen ESOPHAGOGASTRODUODENOSCOPY [name] 06/28/2015   RMR: mild erosive reflux/small HH  . INGUINAL HERNIA REPAIR Right Feb 2012   Dr. Arnoldo Morale   . INGUINAL HERNIA REPAIR Left 11/12/2015   Procedure: HERNIA REPAIR INGUINAL ADULT WITH MESH;  Surgeon: Aviva Signs, MD;  Location: [facility];  Service: General;  Laterality: Left;  . INSERTION OF MESH Left 11/12/2015   Procedure: INSERTION OF MESH;  Surgeon: Aviva Signs, MD;  Location: [facility];  Service:  General;  Laterality: Left;  . LIVER BIOPSY    . NASAL SEPTUM SURGERY      SOCIAL HISTORY: Social History   Social History  . Marital status: Married    Spouse name: [name]  . Number of children: [name]  . Years of education: [name]   Occupational History  . Not on file.   Social History Main Topics  . Smoking status: Current Some Day Smoker    Packs/day: 0.50    Years: 43.00    Types: Cigarettes  . Smokeless tobacco: Never Used  . Alcohol use 0.0  oz/week     Comment: occassional  . Drug use: No  . Sexual activity: Yes   Other Topics Concern  . Not on file   Social History Narrative  . No narrative on file    FAMILY HISTORY: Family History  Problem Relation Age of Onset  . Lung cancer Mother   . Pneumonia Father   . Lung cancer Father   . Pancreatitis Father     Review of Systems  Constitutional: Positive for weight loss.  HENT: Negative.   Eyes: Negative.   Respiratory: Positive for cough and shortness of breath.   Cardiovascular: Positive for chest pain.  Gastrointestinal: Negative.   Genitourinary: Negative.   Musculoskeletal: Positive for back pain and falls.  Skin: Negative.   Neurological: Positive for weakness. Negative for dizziness, tingling, tremors, sensory change, speech change, focal weakness, seizures and loss of consciousness.  Endo/Heme/Allergies: Negative.   Psychiatric/Behavioral: The patient is nervous/anxious and has insomnia.     PHYSICAL EXAMINATION  ECOG PERFORMANCE STATUS: 1 - Symptomatic but completely ambulatory  Vitals:   04/13/16 1406  BP: 122/67  Pulse: 93  Resp: 16  Temp: 99.5 F (37.5 C)    Physical Exam  Constitutional: He is oriented to person, place, and time and well-developed, well-nourished, and in no distress.  Fatty tumor on the right parotid.  HENT:  Head: Normocephalic and atraumatic.  Nose: Nose normal.  Mouth/Throat: Oropharynx is clear and moist. No oropharyngeal exudate.  Thrush  Eyes: Conjunctivae and EOM are normal. Pupils are equal, round, and reactive to light. Right eye exhibits no discharge. Left eye exhibits no discharge. No scleral icterus.  Neck: Normal range of motion. Neck supple. No tracheal deviation present. No thyromegaly present.  Cardiovascular: Normal rate, regular rhythm and normal heart sounds.  Exam reveals no gallop and no friction rub.   No murmur heard. Pulmonary/Chest: Effort normal and breath sounds normal. He has no wheezes. He  has no rales.  Decreased BS R lung  Abdominal: Soft. Bowel sounds are normal. He exhibits no distension and no mass. There is no tenderness. There is no rebound and no guarding.  Musculoskeletal: Normal range of motion. He exhibits no edema.  Lymphadenopathy:    He has no cervical adenopathy.  Neurological: He is alert and oriented to person, place, and time. He has normal reflexes. No cranial nerve deficit. Gait normal. Coordination normal.  Skin: Skin is warm and dry. No rash noted.  Psychiatric: Mood, memory, affect and judgment normal.  Nursing note and vitals reviewed.   LABORATORY DATA: I have reviewed the data as listed. Results for YEE, JOSS (MRN [id number])   Ref. Range 04/06/2016 12:12  WBC Latest Ref Range: 4.0 - 10.5 K/uL 5.4  RBC Latest Ref Range: 4.22 - 5.81 MIL/uL 4.65  Hemoglobin Latest Ref Range: 13.0 - 17.0 g/dL 13.5  HCT Latest Ref Range: 39.0 - 52.0 % 40.0  MCV  Latest Ref Range: 78.0 - 100.0 fL 86.0  MCH Latest Ref Range: 26.0 - 34.0 pg 29.0  MCHC Latest Ref Range: 30.0 - 36.0 g/dL 33.8  RDW Latest Ref Range: 11.5 - 15.5 % 14.3  Platelets Latest Ref Range: 150 - 400 K/uL 195  Neutrophils Latest Units: % 67  Lymphocytes Latest Units: % 21  Monocytes Relative Latest Units: % 11  Eosinophil Latest Units: % 1  Basophil Latest Units: % 0  NEUT# Latest Ref Range: 1.7 - 7.7 K/uL 3.6  Lymphocyte # Latest Ref Range: 0.7 - 4.0 K/uL 1.1  Monocyte # Latest Ref Range: 0.1 - 1.0 K/uL 0.6  Eosinophils Absolute Latest Ref Range: 0.0 - 0.7 K/uL 0.0  Basophils Absolute Latest Ref Range: 0.0 - 0.1 K/uL 0.0  Prothrombin Time Latest Ref Range: 11.6 - 15.2 seconds 14.5  INR Latest Ref Range: 0.00 - 1.49  1.11  APTT Latest Ref Range: 24 - 37 seconds 32   Results for KADARRIUS, YANKE (MRN [id number])   Ref. Range 03/24/2016 09:45  Sodium Latest Ref Range: 135 - 145 mmol/L 132 (L)  Potassium Latest Ref Range: 3.5 - 5.1 mmol/L 4.0  Chloride Latest Ref Range: 101 - 111 mmol/L 96  (L)  CO2 Latest Ref Range: 22 - 32 mmol/L 29  BUN Latest Ref Range: 6 - 20 mg/dL 6  Creatinine Latest Ref Range: 0.61 - 1.24 mg/dL 0.72  Calcium Latest Ref Range: 8.9 - 10.3 mg/dL 9.2  EGFR (Non-African Amer.) Latest Ref Range: >60 mL/min >60  EGFR (African American) Latest Ref Range: >60 mL/min >60  Glucose Latest Ref Range: 65 - 99 mg/dL 103 (H)  Anion gap Latest Ref Range: 5 - 15  7    RADIOGRAPHIC STUDIES: I have personally reviewed the radiological images as listed and agreed with the findings in the report.  Study Result   CLINICAL DATA:  Initial treatment strategy for lung cancer.  EXAM: NUCLEAR MEDICINE PET SKULL BASE TO THIGH  TECHNIQUE: 8.0 mCi F-18 FDG was injected intravenously. Full-ring PET imaging was performed from the skull base to thigh after the radiotracer. CT data was obtained and used for attenuation correction and anatomic localization.  FASTING BLOOD GLUCOSE:  Value: 104 mg/dl  COMPARISON:  None.  FINDINGS: NECK  No hypermetabolic lymph nodes in the neck.  CHEST  Normal heart size. There is aortic atherosclerosis noted. Calcification in the LAD coronary artery noted. Multiple enlarged and hypermetabolic mediastinal lymph nodes are identified. Index right paratracheal lymph node measures 1.4 cm and has an SUV max equal to 6.6 image 76 of series 4. Index sub- carinal lymph node measures 1.8 cm and has an SUV max equal to 12.8, image 87 of series 4. Index posterior mediastinal lymph node measures 1.3 cm and has an SUV max equal to 9.3, image 97 of series 4. Hypermetabolic right internal mammary lymph node measures 1.6 cm and has an SUV max equal to 6.99, image 101 of series 4.  There are numerous pleural based lesions overlying the right lung which exhibit intense FDG uptake. Many of these involve the a J osseous structures. Index lesion overlying the medial left upper lobe measures approximately 2.1 x 2.5 cm and has an SUV max equal  to 12.58. There is associated lytic and sclerotic changes involving the right side of the T5 vertebra. Lesion overlying the anterior lateral aspect of the right middle lobe measures 2 x 0.8 cm and has an SUV max equal to 10.46. Associated lytic changes are  noted involving the overlying right seventh rib, image 113 of series 4.  ABDOMEN/PELVIS  No abnormal hypermetabolic activity within the liver, pancreas, adrenal glands, or spleen. No hypermetabolic lymph nodes in the abdomen or pelvis.  SKELETON  No focal hypermetabolic activity to suggest skeletal metastasis.  IMPRESSION: 1. 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 Multiple hypermetabolic pleural base lesions are identified. Many of these have associated destructive changes involving the adjacent bony structures. 2. 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   Electronically Signed   By: Kerby Moors M.D.   On: 03/13/2016 14:07     PATHOLOGY:     ASSESSMENT and THERAPY PLAN:  Adenocarcinoma of Lung, Stage IV Palliative XRT Spine/rib metastases Tobacco Abuse Chronic Abdominal Pain Weight Loss Thrush Cancer related pain EGFR,ALK, PDL1 negative  Have recommended therapy with carboplatin/alimta. Could also add avastin. Again reviewed staging/prognosis with patient and wife. This has been very difficult for them.  Pain is better controlled today.  I have advised him to call in the interim if there are changes in his pain control.  He has a constipation sheet.  We will plan on chemotherapy teaching. He can start therapy on Friday.  I have offered them a referral to Dr. Aniceto Boss several times, but they are adamant about going to [facility]. I currently do not know anyone at [facility] regarding lung cancer, however this is where they wish to go.   He was prescribed diflucan for his thrush. I will also get him to see Ovid Curd our  nutritionist. He may need an appetite stimulant as well.   Orders Placed This Encounter  Procedures  . CBC with Differential    Standing Status:   Future    Standing Expiration Date:   04/13/2017  . Comprehensive metabolic panel    Standing Status:   Future    Standing Expiration Date:   04/13/2017    All questions were answered. The patient knows to call the clinic with any problems, questions or concerns. We can certainly see the patient much sooner if necessary.  This document serves as a record of services personally performed by Ancil Linsey, MD. It was created on her behalf by Arlyce Harman, a trained medical scribe. The creation of this record is based on the scribe's personal observations and the provider's statements to them. This document has been checked and approved by the attending provider.  I have reviewed the above documentation for accuracy and completeness, and I agree with the above.  This note was electronically signed. Molli Hazard, MD 05/06/2016

## 2016-04-13 ENCOUNTER — Ambulatory Visit
Admission: RE | Admit: 2016-04-13 | Discharge: 2016-04-13 | Disposition: A | Discharge status: Home / Self Care | Payer: 59 | Source: Ambulatory Visit | Attending: Radiation Oncology | Admitting: Radiation Oncology

## 2016-04-13 ENCOUNTER — Ambulatory Visit (HOSPITAL_COMMUNITY): Payer: 59 | Admitting: Hematology & Oncology

## 2016-04-13 ENCOUNTER — Encounter (HOSPITAL_BASED_OUTPATIENT_CLINIC_OR_DEPARTMENT_OTHER): Payer: 59 | Admitting: Hematology & Oncology

## 2016-04-13 ENCOUNTER — Encounter (HOSPITAL_COMMUNITY): Payer: Self-pay | Admitting: Hematology & Oncology

## 2016-04-13 ENCOUNTER — Other Ambulatory Visit (HOSPITAL_COMMUNITY): Payer: Self-pay | Admitting: Emergency Medicine

## 2016-04-13 ENCOUNTER — Encounter (HOSPITAL_COMMUNITY): Payer: Self-pay

## 2016-04-13 VITALS — BP 122/67 | HR 93 | Temp 99.5°F | Resp 16 | Wt 149.5 lb

## 2016-04-13 DIAGNOSIS — R109 Unspecified abdominal pain: Secondary | ICD-10-CM

## 2016-04-13 DIAGNOSIS — C7951 Secondary malignant neoplasm of bone: Secondary | ICD-10-CM

## 2016-04-13 DIAGNOSIS — G893 Neoplasm related pain (acute) (chronic): Secondary | ICD-10-CM

## 2016-04-13 DIAGNOSIS — Z720 Tobacco use: Secondary | ICD-10-CM

## 2016-04-13 DIAGNOSIS — Z510 Encounter for antineoplastic radiation therapy: Secondary | ICD-10-CM | POA: Diagnosis not present

## 2016-04-13 DIAGNOSIS — R634 Abnormal weight loss: Secondary | ICD-10-CM

## 2016-04-13 DIAGNOSIS — C3491 Malignant neoplasm of unspecified part of right bronchus or lung: Secondary | ICD-10-CM | POA: Diagnosis not present

## 2016-04-13 DIAGNOSIS — B370 Candidal stomatitis: Secondary | ICD-10-CM

## 2016-04-13 MED ORDER — FLUCONAZOLE 150 MG PO TABS: 150.0000 mg | ORAL_TABLET | Freq: Every day | ORAL | 0 refills | Status: AC

## 2016-04-13 MED ORDER — MORPHINE SULFATE 15 MG PO TABS: 15.0000 mg | ORAL_TABLET | ORAL | 0 refills | Status: DC | PRN

## 2016-04-13 MED ORDER — MORPHINE SULFATE ER 15 MG PO TBCR: 15.0000 mg | EXTENDED_RELEASE_TABLET | Freq: Two times a day (BID) | ORAL | 0 refills | Status: DC

## 2016-04-13 NOTE — Patient Instructions (Signed)
[place] at [facility] Discharge Instructions  RECOMMENDATIONS MADE BY THE CONSULTANT AND ANY TEST RESULTS WILL BE SENT TO YOUR REFERRING PHYSICIAN.  You saw Dr. Whitney Muse today. Treatment will be scheduled for next Friday. Still waiting on results of PDL-1. Return for follow up and labs in 2 weeks. Diflucan sent to your pharmacy.  Thank you for choosing [place] at [facility] to provide your oncology and hematology care.  To afford each patient quality time with our provider, please arrive at least 15 minutes before your scheduled appointment time.   Beginning January 23rd 2017 lab work for the [facility] will be done in the  Main lab at [facility] on 1st floor. If you have a lab appointment with the [facility] please come in thru the  Main Entrance and check in at the main information desk  You need to re-schedule your appointment should you arrive 10 or more minutes late.  We strive to give you quality time with our providers, and arriving late affects you and other patients whose appointments are after yours.  Also, if you no show three or more times for appointments you may be dismissed from the clinic at the providers discretion.     Again, thank you for choosing [facility].  Our hope is that these requests will decrease the amount of time that you wait before being seen by our physicians.       _____________________________________________________________  Should you have questions after your visit to [facility], please contact our office at [phone] between the hours of 8:30 a.m. and 4:30 p.m.  Voicemails left after 4:30 p.m. will not be returned until the following business day.  For prescription refill requests, have your pharmacy contact our office.         Resources For Cancer Patients and their Caregivers ? American Cancer Society: Can assist with transportation, wigs, general needs,  runs Look Good Feel Better.        224-492-3017 ? Cancer Care: Provides financial assistance, online support groups, medication/co-pay assistance.  1-800-813-HOPE (306)241-7173) ? [place] Assists [place] cancer patients and their families through emotional , educational and financial support.  716-844-3940 ? [place] DSS Where to apply for food stamps, Medicaid and utility assistance. (713)803-3098 ? [facility]: Transportation to medical appointments. 718-658-5696 ? Social Security Administration: May apply for disability if have a Stage IV cancer. (973)424-3656 351-035-2259 ? [employer], Disability and Transit Services: Assists with nutrition, care and transit needs. [place] Support Programs: _0 @ > Cancer Support Group  2nd Tuesday of the month 1pm-2pm, Journey Room  > Creative Journey  3rd Tuesday of the month 1130am-1pm, Journey Room  > Look Good Feel Better  1st Wednesday of the month 10am-12 noon, Journey Room (Call [place] to register [phone])    Ritta Slot, Adult  Ritta Slot is an infection that can happen on the mouth, throat, tongue, or other areas. It causes white patches to form on the mouth and tongue. HOME CARE  Only take medicine as told by your doctor. You may be given medicine to swallow or to apply right on the area.  Eat plain yogurt that contains live cultures (check the label).  Rinse your mouth many times a day with a warm saltwater rinse. To make the rinse, mix 1 teaspoon (6 g) of salt in 8 ounces (0.2 L) of warm water. To reduce  pain:  Drink cold liquids such as water or iced tea.  Eat frozen ice pops or frozen juices.  Eat foods that are easy to swallow, such as gelatin or ice cream.  Drink from a straw if the patches are painful. If you wear dentures:  Take out your dentures before going to bed.  Brush them thoroughly.  Soak them in a denture cleaner. GET  HELP IF:   Your problems are getting worse.  Your problems are not improving within 7 days of starting treatment.  Your infection is spreading. This may show as white patches on the skin outside of your mouth. MAKE SURE YOU:  Understand these instructions.  Will watch your condition.  Will get help right away if you are not doing well or get worse.   This information is not intended to replace advice given to you by your health care provider. Make sure you discuss any questions you have with your health care provider.   Document Released: 11/29/2009 Document Revised: 06/25/2013 Document Reviewed: 04/07/2013 Elsevier Interactive Patient Education [employer].

## 2016-04-14 ENCOUNTER — Ambulatory Visit
Admission: RE | Admit: 2016-04-14 | Discharge: 2016-04-14 | Disposition: A | Discharge status: Home / Self Care | Payer: 59 | Source: Ambulatory Visit | Attending: Radiation Oncology | Admitting: Radiation Oncology

## 2016-04-14 ENCOUNTER — Ambulatory Visit (HOSPITAL_COMMUNITY): Payer: 59 | Admitting: Oncology

## 2016-04-14 VITALS — BP 120/68 | HR 95 | Wt 147.7 lb

## 2016-04-14 DIAGNOSIS — C7951 Secondary malignant neoplasm of bone: Secondary | ICD-10-CM

## 2016-04-14 DIAGNOSIS — Z510 Encounter for antineoplastic radiation therapy: Secondary | ICD-10-CM | POA: Diagnosis not present

## 2016-04-14 MED ORDER — SUCRALFATE 1 G PO TABS: 1.0000 g | ORAL_TABLET | Freq: Three times a day (TID) | ORAL | 0 refills | Status: DC

## 2016-04-14 NOTE — Patient Instructions (Signed)
[place]    CHEMOTHERAPY INSTRUCTIONS   Carboplatin - this medication can be hard on your kidneys - this is why we need you to drinking 64 oz of fluid (preferably water/decaff fluids) 2 days prior to chemo and for up to 4-5 days after chemo. Drink more if you can. This will help to keep your kidneys flushed. This can cause mild hair loss, lower your platelets (which keep you from bleeding out when you cut yourself), lower your white blood cells (fight infection), and cause nausea/vomiting. (only takes 30 minutes to infuse)   Pemetrexed - bone marrow suppression (lowers white blood cells (fight infection), lowers red blood cells (make up your blood), lowers platelets (help blood to clot). Fatigue, nausea/vomiting, chest pain, shortness of breath. While taking Pemetrexed, we will have you take Folic acid at home. You will need to take Folic acid every day and for 21 days after the completion of Pemetrexed. We will also give you Vitamin B12 injections periodically during your treatments. You will also be taking a steroid (Dexamethasone) while taking your Pemetrexed treatments. You will take this the day before, day of, and day after Pemetrexed. This will decrease the incidence of skin rash. Take it whether you think you need it or not.   (takes 10 minutes to infuse)   Avastin - this medication is considered an anti-angiogenic therapy. Avastin is thought to work by causing the blood vessels to shrink away from the tumor, blocking the supply of oxygen and nutrients that the tumor needs to grow. Avastin may also cause the existing blood vessels to change in ways that help the chemotherapy reach the tumor more effectively. Avastin may also work to interfere with the growth of new blood vessels, causing the tumor to starve. Side Effects: Nosebleeds, High Blood Pressure, Protein in Urine, diarrhea, fatigue. First infusion takes 90 minutes. 2nd infusion takes 60 minutes. 3rd infusion and  thereafter takes 30 minutes.      EDUCATIONAL MATERIALS GIVEN AND REVIEWED: Chemotherapy and you books, information on carboplatin, alimta, and avastin given      SELF CARE ACTIVITIES WHILE ON CHEMOTHERAPY:    Increase your fluid intake 48 hours prior to treatment and drink at least 2 quarts (64 oz of water/decaff beverages) per day after treatment. No alcohol intake. No aspirin or other medications unless approved by your oncologist. Eat foods that are light and easy to digest. Eat foods at cold or room temperature (as long as you aren't on the drug Oxaliplatin). No fried, fatty, or spicy foods immediately before or after treatment. Have teeth cleaned professionally before starting treatment. Keep dentures and partial plates clean. Use soft toothbrush and do not use mouthwashes that contain alcohol. Biotene is a good mouthwash that is available at most pharmacies or may be ordered by calling [phone]. Use warm salt water gargles (1 teaspoon salt per 1 quart warm water) before and after meals and at bedtime. Or you may rinse with 2 tablespoons of three-percent hydrogen peroxide mixed in eight ounces of water. Always use sunscreen that has not expired and with SPF (Sun Protection Factor) of 50 or higher. Wear hats to protect your head from the sun. Remember to use sunscreen on your hands, ears, face, & feet. Use your nausea medication as directed to prevent nausea. Use your stool softener or laxative as directed to prevent constipation. Use your anti-diarrheal medication as directed to stop diarrhea.   Please wash your hands for at least 30 seconds using warm  soapy water. Handwashing is the #1 way to prevent the spread of germs. Stay away from sick people or people who are getting over a cold. If you develop respiratory systems such as green/yellow mucus production or productive cough or persistent cough let us know and we will see if you need an antibiotic. It is a good idea to keep a pair  of gloves on when going into grocery stores/[employer] to decrease your risk of coming into contact with germs on the carts, etc. Carry alcohol hand gel with you at all times and use it frequently if out in public. All foods need to be cooked thoroughly. No raw foods. No medium or undercooked meats, eggs. If your food is cooked medium well, it does not need to be hot pink or saturated with bloody liquid at all. Vegetables and fruits need to be washed/rinsed under the faucet with a dish detergent before being consumed. You can eat raw fruits and vegetables unless we tell you otherwise but it would be best if you cooked them or bought frozen. Do not eat off of salad bars or hot bars unless you really trust the cleanliness of the restaurant. If you need dental work, please let Dr. Whitney Muse know before you go for your appointment so that we can coordinate the best possible time for you in regards to your chemo regimen. You need to also let your dentist know that you are actively taking chemo. We may need to do labs prior to your dental appointment. We also want your bowels moving at least every other day. If this is not happening, we need to know so that we can get you on a bowel regimen to help you go. If you are going to have sex, a condom must be used to protect the person that isn't taking chemotherapy. Chemo can decrease your libido (sex drive).       MEDICATIONS: You have been given prescriptions for the following medications:                                                                                                                                                                Zofran/Ondansetron '8mg'$  tablet. Take 1 tablet every 8 hours as needed for nausea/vomiting. (#1 nausea med to take, this can constipate)  Compazine/Prochlorperazine '10mg'$  tablet. Take 1 tablet every 6 hours as needed for nausea/vomiting. (#2 nausea med to take, this can make you sleepy)   EMLA cream. Apply a quarter size  amount to port site 1 hour prior to chemo. Do not rub in. Cover with plastic wrap.     Over-the-Counter Meds:  Miralax 1 capful in 8 oz of fluid daily. May increase to two times a day if needed. This is a stool softener. If this doesn't work  proceed you can add:  Senokot S  - start with 1 tablet two times a day and increase to 4 tablets two times a day if needed. (total of 8 tablets in a 24 hour period). This is a stimulant laxative.   Call us if this does not help your bowels move.   Imodium '2mg'$  capsule. Take 2 capsules after the 1st loose stool and then 1 capsule every 2 hours until you go a total of 12 hours without having a loose stool. Call the [facility] if loose stools continue. If diarrhea occurs @ bedtime, take 2 capsules @ bedtime. Then take 2 capsules every 4 hours until morning. Call [facility].       (Please refer to/review other teaching materials that have been provided to you in this blue folder - What to Know During Chemo, What to know After Chemo, Dr. Donald Pore Advice, Constipation Sheet, Diarrhea Sheet, Nausea Sheet, Self Care Activities While on Chemo)   SYMPTOMS TO REPORT AS SOON AS POSSIBLE AFTER TREATMENT:  FEVER GREATER THAN 100.5 F  CHILLS WITH OR WITHOUT FEVER  NAUSEA AND VOMITING THAT IS NOT CONTROLLED WITH YOUR NAUSEA MEDICATION  UNUSUAL SHORTNESS OF BREATH  UNUSUAL BRUISING OR BLEEDING  TENDERNESS IN MOUTH AND THROAT WITH OR WITHOUT PRESENCE OF ULCERS  URINARY PROBLEMS  BOWEL PROBLEMS  UNUSUAL RASH    Wear comfortable clothing and clothing appropriate for easy access to any Portacath or PICC line. Let us know if there is anything that we can do to make your therapy better!      I have been informed and understand all of the instructions given to me and have received a copy. I have been instructed to call the clinic ([phone])  or my family physician as soon as possible for continued medical care, if indicated. I do not have any more  questions at this time but understand that I may call the [facility] at ([phone] during office hours should I have questions or need assistance in obtaining follow-up care.       Carboplatin injection What is this medicine? CARBOPLATIN (KAR boe pla tin) is a chemotherapy drug. It targets fast dividing cells, like cancer cells, and causes these cells to die. This medicine is used to treat ovarian cancer and many other cancers. This medicine may be used for other purposes; ask your health care provider or pharmacist if you have questions. What should I tell my health care provider before I take this medicine? They need to know if you have any of these conditions: -blood disorders -hearing problems -kidney disease -recent or ongoing radiation therapy -an unusual or allergic reaction to carboplatin, cisplatin, other chemotherapy, other medicines, foods, dyes, or preservatives -pregnant or trying to get pregnant -breast-feeding How should I use this medicine? This drug is usually given as an infusion into a vein. It is administered in a hospital or clinic by a specially trained health care professional. Talk to your pediatrician regarding the use of this medicine in children. Special care may be needed. Overdosage: If you think you have taken too much of this medicine contact a poison control center or emergency room at once. NOTE: This medicine is only for you. Do not share this medicine with others. What if I miss a dose? It is important not to miss a dose. Call your doctor or health care professional if you are unable to keep an appointment. What may interact with this medicine? -medicines for seizures -medicines to increase blood counts  like filgrastim, pegfilgrastim, sargramostim -some antibiotics like amikacin, gentamicin, neomycin, streptomycin, tobramycin -vaccines Talk to your doctor or health care professional before taking any of these  medicines: -acetaminophen -aspirin -ibuprofen -ketoprofen -naproxen This list may not describe all possible interactions. Give your health care provider a list of all the medicines, herbs, non-prescription drugs, or dietary supplements you use. Also tell them if you smoke, drink alcohol, or use illegal drugs. Some items may interact with your medicine. What should I watch for while using this medicine? Your condition will be monitored carefully while you are receiving this medicine. You will need important blood work done while you are taking this medicine. This drug may make you feel generally unwell. This is not uncommon, as chemotherapy can affect healthy cells as well as cancer cells. Report any side effects. Continue your course of treatment even though you feel ill unless your doctor tells you to stop. In some cases, you may be given additional medicines to help with side effects. Follow all directions for their use. Call your doctor or health care professional for advice if you get a fever, chills or sore throat, or other symptoms of a cold or flu. Do not treat yourself. This drug decreases your body's ability to fight infections. Try to avoid being around people who are sick. This medicine may increase your risk to bruise or bleed. Call your doctor or health care professional if you notice any unusual bleeding. Be careful brushing and flossing your teeth or using a toothpick because you may get an infection or bleed more easily. If you have any dental work done, tell your dentist you are receiving this medicine. Avoid taking products that contain aspirin, acetaminophen, ibuprofen, naproxen, or ketoprofen unless instructed by your doctor. These medicines may hide a fever. Do not become pregnant while taking this medicine. Women should inform their doctor if they wish to become pregnant or think they might be pregnant. There is a potential for serious side effects to an unborn child. Talk to  your health care professional or pharmacist for more information. Do not breast-feed an infant while taking this medicine. What side effects may I notice from receiving this medicine? Side effects that you should report to your doctor or health care professional as soon as possible: -allergic reactions like skin rash, itching or hives, swelling of the face, lips, or tongue -signs of infection - fever or chills, cough, sore throat, pain or difficulty passing urine -signs of decreased platelets or bleeding - bruising, pinpoint red spots on the skin, black, tarry stools, nosebleeds -signs of decreased red blood cells - unusually weak or tired, fainting spells, lightheadedness -breathing problems -changes in hearing -changes in vision -chest pain -high blood pressure -low blood counts - This drug may decrease the number of white blood cells, red blood cells and platelets. You may be at increased risk for infections and bleeding. -nausea and vomiting -pain, swelling, redness or irritation at the injection site -pain, tingling, numbness in the hands or feet -problems with balance, talking, walking -trouble passing urine or change in the amount of urine Side effects that usually do not require medical attention (report to your doctor or health care professional if they continue or are bothersome): -hair loss -loss of appetite -metallic taste in the mouth or changes in taste This list may not describe all possible side effects. Call your doctor for medical advice about side effects. You may report side effects to FDA at 1-800-FDA-1088. Where should I keep my medicine?  This drug is given in a hospital or clinic and will not be stored at home. NOTE: This sheet is a summary. It may not cover all possible information. If you have questions about this medicine, talk to your doctor, pharmacist, or health care provider.    2016, Elsevier/Gold Standard. (2007-12-10 14:38:05)       Pemetrexed  injection What is this medicine? PEMETREXED (PEM e TREX ed) is a chemotherapy drug. This medicine affects cells that are rapidly growing, such as cancer cells and cells in your mouth and stomach. It is usually used to treat lung cancers like non-small cell lung cancer and mesothelioma. It may also be used to treat other cancers. This medicine may be used for other purposes; ask your health care provider or pharmacist if you have questions. What should I tell my health care provider before I take this medicine? They need to know if you have any of these conditions: -if you frequently drink alcohol containing beverages -infection (especially a virus infection such as chickenpox, cold sores, or herpes) -kidney disease -liver disease -low blood counts, like low platelets, red bloods, or white blood cells -an unusual or allergic reaction to pemetrexed, mannitol, other medicines, foods, dyes, or preservatives -pregnant or trying to get pregnant -breast-feeding How should I use this medicine? This drug is given as an infusion into a vein. It is administered in a hospital or clinic by a specially trained health care professional. Talk to your pediatrician regarding the use of this medicine in children. Special care may be needed. Overdosage: If you think you have taken too much of this medicine contact a poison control center or emergency room at once. NOTE: This medicine is only for you. Do not share this medicine with others. What if I miss a dose? It is important not to miss your dose. Call your doctor or health care professional if you are unable to keep an appointment. What may interact with this medicine? -aspirin and aspirin-like medicines -medicines to increase blood counts like filgrastim, pegfilgrastim, sargramostim -methotrexate -NSAIDS, medicines for pain and inflammation, like ibuprofen or naproxen -probenecid -pyrimethamine -vaccines Talk to your doctor or health care professional  before taking any of these medicines: -acetaminophen -aspirin -ibuprofen -ketoprofen -naproxen This list may not describe all possible interactions. Give your health care provider a list of all the medicines, herbs, non-prescription drugs, or dietary supplements you use. Also tell them if you smoke, drink alcohol, or use illegal drugs. Some items may interact with your medicine. What should I watch for while using this medicine? Visit your doctor for checks on your progress. This drug may make you feel generally unwell. This is not uncommon, as chemotherapy can affect healthy cells as well as cancer cells. Report any side effects. Continue your course of treatment even though you feel ill unless your doctor tells you to stop. In some cases, you may be given additional medicines to help with side effects. Follow all directions for their use. Call your doctor or health care professional for advice if you get a fever, chills or sore throat, or other symptoms of a cold or flu. Do not treat yourself. This drug decreases your body's ability to fight infections. Try to avoid being around people who are sick. This medicine may increase your risk to bruise or bleed. Call your doctor or health care professional if you notice any unusual bleeding. Be careful brushing and flossing your teeth or using a toothpick because you may get an infection or bleed  more easily. If you have any dental work done, tell your dentist you are receiving this medicine. Avoid taking products that contain aspirin, acetaminophen, ibuprofen, naproxen, or ketoprofen unless instructed by your doctor. These medicines may hide a fever. Call your doctor or health care professional if you get diarrhea or mouth sores. Do not treat yourself. To protect your kidneys, drink water or other fluids as directed while you are taking this medicine. Men and women must use effective birth control while taking this medicine. You may also need to continue  using effective birth control for a time after stopping this medicine. Do not become pregnant while taking this medicine. Tell your doctor right away if you think that you or your partner might be pregnant. There is a potential for serious side effects to an unborn child. Talk to your health care professional or pharmacist for more information. Do not breast-feed an infant while taking this medicine. This medicine may lower sperm counts. What side effects may I notice from receiving this medicine? Side effects that you should report to your doctor or health care professional as soon as possible: -allergic reactions like skin rash, itching or hives, swelling of the face, lips, or tongue -low blood counts - this medicine may decrease the number of white blood cells, red blood cells and platelets. You may be at increased risk for infections and bleeding. -signs of infection - fever or chills, cough, sore throat, pain or difficulty passing urine -signs of decreased platelets or bleeding - bruising, pinpoint red spots on the skin, black, tarry stools, blood in the urine -signs of decreased red blood cells - unusually weak or tired, fainting spells, lightheadedness -breathing problems, like a dry cough -changes in emotions or moods -chest pain -confusion -diarrhea -high blood pressure -mouth or throat sores or ulcers -pain, swelling, warmth in the leg -pain on swallowing -swelling of the ankles, feet, hands -trouble passing urine or change in the amount of urine -vomiting -yellowing of the eyes or skin Side effects that usually do not require medical attention (report to your doctor or health care professional if they continue or are bothersome): -hair loss -loss of appetite -nausea -stomach upset This list may not describe all possible side effects. Call your doctor for medical advice about side effects. You may report side effects to FDA at 1-800-FDA-1088. Where should I keep my  medicine? This drug is given in a hospital or clinic and will not be stored at home. NOTE: This sheet is a summary. It may not cover all possible information. If you have questions about this medicine, talk to your doctor, pharmacist, or health care provider.    2016, Elsevier/Gold Standard. (2008-04-07 13:24:03)       Bevacizumab injection What is this medicine? BEVACIZUMAB (be va SIZ yoo mab) is a monoclonal antibody. It is used to treat cervical cancer, colorectal cancer, glioblastoma multiforme, non-small cell lung cancer (NSCLC), ovarian cancer, and renal cell cancer. This medicine may be used for other purposes; ask your health care provider or pharmacist if you have questions. What should I tell my health care provider before I take this medicine? They need to know if you have any of these conditions: -blood clots -heart disease, including heart failure, heart attack, or chest pain (angina) -high blood pressure -infection (especially a virus infection such as chickenpox, cold sores, or herpes) -kidney disease -lung disease -prior chemotherapy with doxorubicin, daunorubicin, epirubicin, or other anthracycline type chemotherapy agents -recent or ongoing radiation therapy -recent surgery -stroke -  an unusual or allergic reaction to bevacizumab, hamster proteins, mouse proteins, other medicines, foods, dyes, or preservatives -pregnant or trying to get pregnant -breast-feeding How should I use this medicine? This medicine is for infusion into a vein. It is given by a health care professional in a hospital or clinic setting. Talk to your pediatrician regarding the use of this medicine in children. Special care may be needed. Overdosage: If you think you have taken too much of this medicine contact a poison control center or emergency room at once. NOTE: This medicine is only for you. Do not share this medicine with others. What if I miss a dose? It is important not to miss your  dose. Call your doctor or health care professional if you are unable to keep an appointment. What may interact with this medicine? Interactions are not expected. This list may not describe all possible interactions. Give your health care provider a list of all the medicines, herbs, non-prescription drugs, or dietary supplements you use. Also tell them if you smoke, drink alcohol, or use illegal drugs. Some items may interact with your medicine. What should I watch for while using this medicine? Your condition will be monitored carefully while you are receiving this medicine. You will need important blood work and urine testing done while you are taking this medicine. During your treatment, let your health care professional know if you have any unusual symptoms, such as difficulty breathing. This medicine may rarely cause 'gastrointestinal perforation' (holes in the stomach, intestines or colon), a serious side effect requiring surgery to repair. This medicine should be started at least 28 days following major surgery and the site of the surgery should be totally healed. Check with your doctor before scheduling dental work or surgery while you are receiving this treatment. Talk to your doctor if you have recently had surgery or if you have a wound that has not healed. Do not become pregnant while taking this medicine or for 6 months after stopping it. Women should inform their doctor if they wish to become pregnant or think they might be pregnant. There is a potential for serious side effects to an unborn child. Talk to your health care professional or pharmacist for more information. Do not breast-feed an infant while taking this medicine. This medicine has caused ovarian failure in some women. This medicine may interfere with the ability to have a child. You should talk to your doctor or health care professional if you are concerned about your fertility. What side effects may I notice from receiving this  medicine? Side effects that you should report to your doctor or health care professional as soon as possible: -allergic reactions like skin rash, itching or hives, swelling of the face, lips, or tongue -signs of infection - fever or chills, cough, sore throat, pain or trouble passing urine -signs of decreased platelets or bleeding - bruising, pinpoint red spots on the skin, black, tarry stools, nosebleeds, blood in the urine -breathing problems -changes in vision -chest pain -confusion -jaw pain, especially after dental work -mouth sores -seizures -severe abdominal pain -severe headache -sudden numbness or weakness of the face, arm or leg -swelling of legs or ankles -symptoms of a stroke: change in mental awareness, inability to talk or move one side of the body (especially in patients with lung cancer) -trouble passing urine or change in the amount of urine -trouble speaking or understanding -trouble walking, dizziness, loss of balance or coordination Side effects that usually do not require medical attention (  report to your doctor or health care professional if they continue or are bothersome): -constipation -diarrhea -dry skin -headache -loss of appetite -nausea, vomiting This list may not describe all possible side effects. Call your doctor for medical advice about side effects. You may report side effects to FDA at 1-800-FDA-1088. Where should I keep my medicine? This drug is given in a hospital or clinic and will not be stored at home. NOTE: This sheet is a summary. It may not cover all possible information. If you have questions about this medicine, talk to your doctor, pharmacist, or health care provider.    2016, Elsevier/Gold Standard. (2014-11-03 16:58:44)

## 2016-04-14 NOTE — Progress Notes (Signed)
  Radiation Oncology         [phone]   Name: Jeffrey Gutierrez MRN: [id number]   Date: 04/14/2016  DOB: [birth year]   Weekly Radiation Therapy Management       ICD-9-CM ICD-10-CM   1. 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 Weight and vitals stable. Denies pain. Reports difficulty with swallowing. Reports shortness of breath managed with rest and inhaler. Reports a productive cough with clear to white sputum. Denies hemoptysis. Reports mild fatigue.   The patient is without complaint. Set-up films were reviewed. The chart was checked.  Physical Findings  weight is 147 lb 11.2 oz (67 kg). His blood pressure is 120/68 and his pulse is 95. . Weight essentially stable.  No significant changes.  Impression The patient is tolerating radiation.  Plan Continue treatment as planned.         Sheral Apley Tammi Klippel, M.D.  This document serves as a record of services personally performed by Tyler Pita, MD. It was created on his behalf by Truddie Hidden, a trained medical scribe. The creation of this record is based on the scribe's personal observations and the provider's statements to them. This document has been checked and approved by the attending provider.

## 2016-04-14 NOTE — Progress Notes (Signed)
Weight and vitals stable. Denies pain. Reports difficulty with swallowing. Reports shortness of breath managed with rest and inhaler. Reports a productive cough with clear to white sputum. Denies hemoptysis. Reports mild fatigue.   BP 120/68 (BP Location: Left Arm, Patient Position: Sitting, Cuff Size: Normal)   Pulse 95   Wt 147 lb 11.2 oz (67 kg)   BMI 21.19 kg/m  Wt Readings from Last 3 Encounters:  04/14/16 147 lb 11.2 oz (67 kg)  04/13/16 149 lb 8 oz (67.8 kg)  04/06/16 142 lb 6.4 oz (64.6 kg)

## 2016-04-17 ENCOUNTER — Encounter (HOSPITAL_COMMUNITY): Payer: Self-pay

## 2016-04-17 ENCOUNTER — Telehealth (HOSPITAL_COMMUNITY): Payer: Self-pay | Admitting: Hematology & Oncology

## 2016-04-17 ENCOUNTER — Other Ambulatory Visit (HOSPITAL_COMMUNITY): Payer: Self-pay | Admitting: Emergency Medicine

## 2016-04-17 ENCOUNTER — Ambulatory Visit
Admission: RE | Admit: 2016-04-17 | Discharge: 2016-04-17 | Disposition: A | Discharge status: Home / Self Care | Payer: 59 | Source: Ambulatory Visit | Attending: Radiation Oncology | Admitting: Radiation Oncology

## 2016-04-17 DIAGNOSIS — Z510 Encounter for antineoplastic radiation therapy: Secondary | ICD-10-CM | POA: Diagnosis not present

## 2016-04-17 NOTE — Progress Notes (Unsigned)
Called pt this morning to see when he would want to come in for chemo teaching and he said that he had a second opinion on 8/8 and that he wanted to go to that first, so I canceled all of his appts her first.  He will call us to reschedule if he wants to be treated here.

## 2016-04-18 ENCOUNTER — Ambulatory Visit
Admission: RE | Admit: 2016-04-18 | Discharge: 2016-04-18 | Disposition: A | Discharge status: Home / Self Care | Payer: 59 | Source: Ambulatory Visit | Attending: Radiation Oncology | Admitting: Radiation Oncology

## 2016-04-18 DIAGNOSIS — Z510 Encounter for antineoplastic radiation therapy: Secondary | ICD-10-CM | POA: Diagnosis not present

## 2016-04-18 DIAGNOSIS — C7951 Secondary malignant neoplasm of bone: Secondary | ICD-10-CM

## 2016-04-18 MED ORDER — SONAFINE EX EMUL
1.0000 "application " | Freq: Once | CUTANEOUS | Status: AC
  Administered 2016-04-18: 1 via TOPICAL

## 2016-04-18 NOTE — Progress Notes (Signed)
Patient reports having burning on his central chest when he touches it.  He has a small red area on his central chest.  Gave patient sonafine and instructed him to apply it 2 times a day, after treatment and at bedtime.

## 2016-04-19 ENCOUNTER — Ambulatory Visit
Admission: RE | Admit: 2016-04-19 | Discharge: 2016-04-19 | Disposition: A | Discharge status: Home / Self Care | Payer: 59 | Source: Ambulatory Visit | Attending: Radiation Oncology | Admitting: Radiation Oncology

## 2016-04-19 ENCOUNTER — Telehealth: Payer: Self-pay | Admitting: *Deleted

## 2016-04-19 DIAGNOSIS — Z510 Encounter for antineoplastic radiation therapy: Secondary | ICD-10-CM | POA: Diagnosis not present

## 2016-04-19 NOTE — Telephone Encounter (Signed)
On 04-19-16 fax medical records to [facility] , it was consult note,

## 2016-04-20 ENCOUNTER — Ambulatory Visit
Admission: RE | Admit: 2016-04-20 | Discharge: 2016-04-20 | Disposition: A | Discharge status: Home / Self Care | Payer: 59 | Source: Ambulatory Visit | Attending: Radiation Oncology | Admitting: Radiation Oncology

## 2016-04-20 DIAGNOSIS — Z510 Encounter for antineoplastic radiation therapy: Secondary | ICD-10-CM | POA: Diagnosis not present

## 2016-04-21 ENCOUNTER — Encounter: Payer: Self-pay | Admitting: Radiation Oncology

## 2016-04-21 ENCOUNTER — Encounter: Payer: Self-pay | Admitting: Internal Medicine

## 2016-04-21 ENCOUNTER — Ambulatory Visit
Admission: RE | Admit: 2016-04-21 | Discharge: 2016-04-21 | Disposition: A | Discharge status: Home / Self Care | Payer: 59 | Source: Ambulatory Visit | Attending: Radiation Oncology | Admitting: Radiation Oncology

## 2016-04-21 ENCOUNTER — Ambulatory Visit (HOSPITAL_COMMUNITY): Payer: 59

## 2016-04-21 VITALS — BP 118/80 | HR 101 | Resp 18 | Wt 147.0 lb

## 2016-04-21 DIAGNOSIS — Z510 Encounter for antineoplastic radiation therapy: Secondary | ICD-10-CM | POA: Diagnosis not present

## 2016-04-21 DIAGNOSIS — C7951 Secondary malignant neoplasm of bone: Secondary | ICD-10-CM

## 2016-04-21 NOTE — Progress Notes (Signed)
  Radiation Oncology         [phone]   Name: Jeffrey Gutierrez MRN: [id number]   Date: 04/21/2016  DOB: July 10, 1956   Weekly Radiation Therapy Management       ICD-9-CM ICD-10-CM   1. 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  Weight and vitals stable. Denies pain. Denies numbness or tingling of his extremities except for 4th and 5th fingers on left hand that have always been numb. Reports he is trying to stop smoking. Denies any pain or difficulty swallowing. Reports skin irritation center of chest resolved with sonafine. Denies pain at this time. Denies nausea or vomiting. Reports fatigue.   The patient is without complaint. Set-up films were reviewed. The chart was checked.  Physical Findings  weight is 147 lb (66.7 kg). His blood pressure is 118/80 and his pulse is 101 (abnormal). His respiration is 18 and oxygen saturation is 94%. . Weight essentially stable.  No significant changes.  Impression The patient is tolerating radiation.  Plan The patient has completed treatment today. He was given a one month follow up appointment card. I told him it was okay to go to the chiropractor.      ------------------------------------------------   Tyler Pita, MD    This document serves as a record of services personally performed by Tyler Pita, MD. It was created on his behalf by Lendon Collar, a trained medical scribe. The creation of this record is based on the scribe's personal observations and the provider's statements to them. This document has been checked and approved by the attending provider.

## 2016-04-21 NOTE — Progress Notes (Addendum)
Weight and vitals stable. Denies pain. Denies numbness or tingling of his extremities except for 4th and 5th fingers on left hand that have always been numb. Reports he is trying to stop smoking. Denies any pain or difficulty swallowing. Reports skin irritation center of chest resolved with sonafine. Denies pain at this time. Denies nausea or vomiting. Reports fatigue. One month follow up appointment card given.   BP 118/80   Pulse (!) 101   Resp 18   Wt 147 lb (66.7 kg)   BMI 21.09 kg/m  Wt Readings from Last 3 Encounters:  04/21/16 147 lb (66.7 kg)  04/14/16 147 lb 11.2 oz (67 kg)  04/13/16 149 lb 8 oz (67.8 kg)

## 2016-04-23 ENCOUNTER — Encounter (HOSPITAL_COMMUNITY): Payer: Self-pay | Admitting: Hematology & Oncology

## 2016-04-23 NOTE — Progress Notes (Signed)
  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     Indication for treatment:  Palliation       Radiation treatment dates:   04/10/16-04/21/16  Site/dose:   The T-spine was treated to 30 Gy in 10 fractions  Beams/energy:   15 MV X-rays  Narrative: The patient tolerated radiation treatment relatively well.   He had minor esophageal symptoms and no significant improvement in pain.  Plan: The patient has completed radiation treatment. The patient will return to radiation oncology clinic for routine followup in one month. I advised him to call or return sooner if he has any questions or concerns related to his recovery or treatment. ________________________________  Sheral Apley. Tammi Klippel, M.D.

## 2016-04-24 ENCOUNTER — Encounter (HOSPITAL_COMMUNITY): Payer: Self-pay

## 2016-04-27 ENCOUNTER — Other Ambulatory Visit: Payer: Self-pay

## 2016-04-27 ENCOUNTER — Emergency Department (HOSPITAL_COMMUNITY): Payer: 59

## 2016-04-27 ENCOUNTER — Encounter (HOSPITAL_COMMUNITY): Payer: Self-pay | Admitting: Emergency Medicine

## 2016-04-27 ENCOUNTER — Inpatient Hospital Stay (HOSPITAL_COMMUNITY)
Admission: EM | Admit: 2016-04-27 | Discharge: 2016-04-29 | DRG: 190 | Disposition: A | Discharge status: Home / Self Care | Payer: 59 | Attending: Internal Medicine | Admitting: Internal Medicine

## 2016-04-27 DIAGNOSIS — Z6820 Body mass index (BMI) 20.0-20.9, adult: Secondary | ICD-10-CM | POA: Diagnosis not present

## 2016-04-27 DIAGNOSIS — E43 Unspecified severe protein-calorie malnutrition: Secondary | ICD-10-CM | POA: Diagnosis present

## 2016-04-27 DIAGNOSIS — Z79899 Other long term (current) drug therapy: Secondary | ICD-10-CM | POA: Diagnosis not present

## 2016-04-27 DIAGNOSIS — K7460 Unspecified cirrhosis of liver: Secondary | ICD-10-CM | POA: Diagnosis present

## 2016-04-27 DIAGNOSIS — R0600 Dyspnea, unspecified: Secondary | ICD-10-CM | POA: Diagnosis not present

## 2016-04-27 DIAGNOSIS — J441 Chronic obstructive pulmonary disease with (acute) exacerbation: Secondary | ICD-10-CM | POA: Diagnosis present

## 2016-04-27 DIAGNOSIS — K743 Primary biliary cirrhosis: Secondary | ICD-10-CM | POA: Diagnosis present

## 2016-04-27 DIAGNOSIS — I10 Essential (primary) hypertension: Secondary | ICD-10-CM | POA: Diagnosis present

## 2016-04-27 DIAGNOSIS — J9601 Acute respiratory failure with hypoxia: Secondary | ICD-10-CM | POA: Diagnosis present

## 2016-04-27 DIAGNOSIS — C3491 Malignant neoplasm of unspecified part of right bronchus or lung: Secondary | ICD-10-CM | POA: Diagnosis present

## 2016-04-27 DIAGNOSIS — K219 Gastro-esophageal reflux disease without esophagitis: Secondary | ICD-10-CM | POA: Diagnosis present

## 2016-04-27 DIAGNOSIS — J449 Chronic obstructive pulmonary disease, unspecified: Secondary | ICD-10-CM | POA: Diagnosis present

## 2016-04-27 DIAGNOSIS — F17210 Nicotine dependence, cigarettes, uncomplicated: Secondary | ICD-10-CM | POA: Diagnosis present

## 2016-04-27 DIAGNOSIS — R0603 Acute respiratory distress: Secondary | ICD-10-CM

## 2016-04-27 LAB — CBC WITH DIFFERENTIAL/PLATELET
BASOS ABS: 0 10*3/uL (ref 0.0–0.1)
BASOS PCT: 0 %
Eosinophils Absolute: 0 10*3/uL (ref 0.0–0.7)
Eosinophils Relative: 1 %
HEMATOCRIT: 37.6 % — AB (ref 39.0–52.0)
HEMOGLOBIN: 12.4 g/dL — AB (ref 13.0–17.0)
LYMPHS PCT: 12 %
Lymphs Abs: 0.3 10*3/uL — ABNORMAL LOW (ref 0.7–4.0)
MCH: 29 pg (ref 26.0–34.0)
MCHC: 33 g/dL (ref 30.0–36.0)
MCV: 88.1 fL (ref 78.0–100.0)
MONOS PCT: 18 %
Monocytes Absolute: 0.5 10*3/uL (ref 0.1–1.0)
NEUTROS ABS: 1.9 10*3/uL (ref 1.7–7.7)
NEUTROS PCT: 69 %
Platelets: 154 10*3/uL (ref 150–400)
RBC: 4.27 MIL/uL (ref 4.22–5.81)
RDW: 14.6 % (ref 11.5–15.5)
WBC: 2.7 10*3/uL — ABNORMAL LOW (ref 4.0–10.5)

## 2016-04-27 LAB — COMPREHENSIVE METABOLIC PANEL
ALBUMIN: 2.7 g/dL — AB (ref 3.5–5.0)
ALK PHOS: 86 U/L (ref 38–126)
ALT: 16 U/L — AB (ref 17–63)
AST: 23 U/L (ref 15–41)
Anion gap: 5 (ref 5–15)
BILIRUBIN TOTAL: 0.5 mg/dL (ref 0.3–1.2)
BUN: 11 mg/dL (ref 6–20)
CALCIUM: 7.9 mg/dL — AB (ref 8.9–10.3)
CO2: 30 mmol/L (ref 22–32)
Chloride: 94 mmol/L — ABNORMAL LOW (ref 101–111)
Creatinine, Ser: 0.64 mg/dL (ref 0.61–1.24)
GFR calc Af Amer: >60 mL/min (ref >60)
GFR calc non Af Amer: >60 mL/min (ref >60)
GLUCOSE: 127 mg/dL — AB (ref 65–99)
Potassium: 4.2 mmol/L (ref 3.5–5.1)
SODIUM: 129 mmol/L — AB (ref 135–145)
TOTAL PROTEIN: 6.8 g/dL (ref 6.5–8.1)

## 2016-04-27 LAB — BLOOD GAS, ARTERIAL
Acid-Base Excess: 6.8 mmol/L — ABNORMAL HIGH (ref 0.0–2.0)
Bicarbonate: 30 mEq/L — ABNORMAL HIGH (ref 20.0–24.0)
DRAWN BY: 10006
O2 Content: 3.5 L/min
O2 Saturation: 96 %
PATIENT TEMPERATURE: 37
pCO2 arterial: 49.3 mmHg — ABNORMAL HIGH (ref 35.0–45.0)
pH, Arterial: 7.419 (ref 7.350–7.450)
pO2, Arterial: 80.1 mmHg (ref 80.0–100.0)

## 2016-04-27 LAB — I-STAT TROPONIN, ED
TROPONIN I, POC: 0.01 ng/mL (ref 0.00–0.08)
Troponin i, poc: 0 ng/mL (ref 0.00–0.08)

## 2016-04-27 LAB — I-STAT CG4 LACTIC ACID, ED
LACTIC ACID, VENOUS: 0.83 mmol/L (ref 0.5–1.9)
Lactic Acid, Venous: 0.56 mmol/L (ref 0.5–1.9)

## 2016-04-27 LAB — BRAIN NATRIURETIC PEPTIDE: B Natriuretic Peptide: 67 pg/mL (ref 0.0–100.0)

## 2016-04-27 MED ORDER — IPRATROPIUM BROMIDE 0.02 % IN SOLN
0.5000 mg | RESPIRATORY_TRACT | Status: AC
  Administered 2016-04-27: 0.5 mg via RESPIRATORY_TRACT
  Filled 2016-04-27: qty 2.5

## 2016-04-27 MED ORDER — IOPAMIDOL (ISOVUE-370) INJECTION 76%
100.0000 mL | Freq: Once | INTRAVENOUS | Status: AC | PRN
  Administered 2016-04-27: 100 mL via INTRAVENOUS

## 2016-04-27 MED ORDER — LEVOFLOXACIN IN D5W 750 MG/150ML IV SOLN
750.0000 mg | Freq: Once | INTRAVENOUS | Status: AC
  Administered 2016-04-28: 750 mg via INTRAVENOUS
  Filled 2016-04-27: qty 150

## 2016-04-27 MED ORDER — ALBUTEROL (5 MG/ML) CONTINUOUS INHALATION SOLN
10.0000 mg/h | INHALATION_SOLUTION | RESPIRATORY_TRACT | Status: DC
  Administered 2016-04-27: 10 mg/h via RESPIRATORY_TRACT
  Filled 2016-04-27: qty 20

## 2016-04-27 MED ORDER — METHYLPREDNISOLONE SODIUM SUCC 125 MG IJ SOLR
125.0000 mg | Freq: Once | INTRAMUSCULAR | Status: AC
  Administered 2016-04-27: 125 mg via INTRAVENOUS
  Filled 2016-04-27: qty 2

## 2016-04-27 NOTE — ED Notes (Signed)
Dr Sabra Heck in to reassess

## 2016-04-27 NOTE — ED Notes (Signed)
Pt unable to void at this time- to CT via stretcher

## 2016-04-27 NOTE — ED Provider Notes (Signed)
[place] DEPT Provider Note   CSN: [id number] Arrival date & time: 04/27/16  2006  First Provider Contact:  First MD Initiated Contact with Patient 04/27/16 2018        History   Chief Complaint Chief Complaint  Patient presents with  . Shortness of Breath    HPI Jeffrey Gutierrez is a 60 y.o. male.  HPI Patient with history of lung cancer and bony metastasis presents with acute worsening of his shortness of breath. States it started this evening while trying to walk across the room. He has some baseline shortness of breath. Denies any cough or chest pain. No fever or chills. He has bilateral lower extremity swelling for which he takes Lasix for. States he finished radiation treatment on Friday. Past Medical History:  Diagnosis Date  . Bone cancer ([place])   . COPD (chronic obstructive pulmonary disease) ([place])   . Diverticulitis   . Essential hypertension   . GERD (gastroesophageal reflux disease)   . Hiatal hernia   . Hyperplastic rectal polyp   . Lung cancer ([place])   . 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 S/P colonoscopy 2009   Pancolonic diverticula, hyperplastic rectal polyps    Patient Active Problem List   Diagnosis Date Noted  . COPD exacerbation ([place]) 04/28/2016  . Protein-calorie malnutrition, severe 04/28/2016  . Acute respiratory failure with hypoxia ([place]) 04/27/2016  . Spine metastasis ([place]) 04/01/2016  . Metastasis to bone ([place]) 04/01/2016  . Adenocarcinoma of right lung, stage 4 ([place]) 04/01/2016  . Loss of weight 12/22/2015  . 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 RUQ pain 09/27/2015  . Hepatic cirrhosis ([place]) 07/26/2015  . Hiatal hernia   . Reflux esophagitis   . Diverticulosis of colon without hemorrhage   . History of colonic polyps   . Change in bowel function 03/10/2015  . Constipation 02/04/2015  . 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 OTHER CHRONIC NONALCOHOLIC LIVER DISEASE 32/67/1245  .  Abdominal pain 01/20/2009  . [place] NONSPECIFIC IMMUNOLOGICAL FINDINGS 01/20/2009  . CHOLECYSTECTOMY, HX OF 01/20/2009    Past Surgical History:  Procedure Laterality Date  . Arm Surgery Left    ulnar nerve release  . CHOLECYSTECTOMY    . COLONOSCOPY  2009   Pancolonic diverticula, hyperplastic rectal polyps  . COLONOSCOPY [name] 03/24/2015   RMR: Single colonic polyp removed as described above. Pancolonic diverticulosis. Abnormal sigmoid colon consistent with stigmata of recent diverticulitis.   Marland Kitchen ESOPHAGOGASTRODUODENOSCOPY [name] 06/28/2015   RMR: mild erosive reflux/small HH  . INGUINAL HERNIA REPAIR Right Feb 2012   Dr. Arnoldo Morale   . INGUINAL HERNIA REPAIR Left 11/12/2015   Procedure: HERNIA REPAIR INGUINAL ADULT WITH MESH;  Surgeon: Aviva Signs, MD;  Location: [facility];  Service: General;  Laterality: Left;  . INSERTION OF MESH Left 11/12/2015   Procedure: INSERTION OF MESH;  Surgeon: Aviva Signs, MD;  Location: [facility];  Service: General;  Laterality: Left;  . LIVER BIOPSY    . NASAL SEPTUM SURGERY         Home Medications    Prior to Admission medications   Medication Sig Start Date End Date Taking? Authorizing Provider  acetaminophen (TYLENOL) 500 MG tablet Take 1,000 mg by mouth every 8 (eight) hours as needed for moderate pain or headache.    Yes Historical Provider, MD  albuterol (PROVENTIL HFA;VENTOLIN HFA) 108 (90 Base) MCG/ACT inhaler Inhale 2 puffs into the lungs every 4 (four) hours as needed for  wheezing or shortness of breath. 09/17/15  Yes Merrily Pew, MD  budesonide-formoterol [facility]) 160-4.5 MCG/ACT inhaler Inhale 2 puffs into the lungs 2 (two) times daily.   Yes Historical Provider, MD  dronabinol (MARINOL) 5 MG capsule Take 1 capsule by mouth 2 (two) times daily. 04/26/16  Yes Historical Provider, MD  DULoxetine (CYMBALTA) 30 MG capsule Take 30 mg by mouth 2 (two) times daily.  02/23/16  Yes Historical Provider, MD  ibuprofen (ADVIL,MOTRIN) 200 MG tablet Take  200 mg by mouth every 6 (six) hours as needed for mild pain or moderate pain. Reported on 12/14/2015   Yes Historical Provider, MD  morphine (MS CONTIN) 15 MG 12 hr tablet Take 1 tablet (15 mg total) by mouth every 12 (twelve) hours. 04/13/16  Yes Patrici Ranks, MD  morphine (MSIR) 15 MG tablet Take 1 tablet (15 mg total) by mouth every 4 (four) hours as needed for severe pain. 04/13/16  Yes Patrici Ranks, MD  OCALIVA 5 MG TABS Take 1 tablet by mouth daily. Patient taking differently: Take 5 mg by mouth daily.  03/02/16  Yes Orvil Feil, NP  omeprazole (PRILOSEC) 20 MG capsule Take 1 capsule (20 mg total) by mouth daily. 01/24/16  Yes Mahala Menghini, PA-C  ondansetron (ZOFRAN) 8 MG tablet Take 1 tablet (8 mg total) by mouth every 8 (eight) hours as needed for nausea or vomiting. 04/03/16  Yes Patrici Ranks, MD  polyethylene glycol (MIRALAX / GLYCOLAX) packet Take 34 g by mouth 2 (two) times daily.    Yes Historical Provider, MD  tiotropium (SPIRIVA) 18 MCG inhalation capsule Place 18 mcg into inhaler and inhale daily.   Yes Historical Provider, MD  nystatin (MYCOSTATIN) 100000 UNIT/ML suspension Take 5 mLs (500,000 Units total) by mouth 4 (four) times daily. Patient not taking: Reported on 04/27/2016 04/03/16   Patrici Ranks, MD  sucralfate (CARAFATE) 1 g tablet Take 1 tablet (1 g total) by mouth 4 (four) times daily -  with meals and at bedtime. Patient not taking: Reported on 04/27/2016 04/14/16   Hayden Pedro, PA-C  Wound Dressings (SONAFINE EX) Apply topically.    Historical Provider, MD    Family History Family History  Problem Relation Age of Onset  . Lung cancer Mother   . Pneumonia Father   . Lung cancer Father   . Pancreatitis Father     Social History Social History  Substance Use Topics  . Smoking status: Current Some Day Smoker    Packs/day: 0.50    Years: 43.00    Types: Cigarettes  . Smokeless tobacco: Never Used  . Alcohol use 0.0 oz/week     Comment:  occassional     Allergies   Dihydrocodeine; Hydrocodone; Doxycycline; and Linzess [linaclotide]   Review of Systems Review of Systems  Constitutional: Negative for chills and fever.  Respiratory: Positive for shortness of breath. Negative for cough and wheezing.   Cardiovascular: Positive for leg swelling. Negative for chest pain and palpitations.  Gastrointestinal: Negative for abdominal pain, diarrhea, nausea and vomiting.  Musculoskeletal: Negative for back pain, neck pain and neck stiffness.  Skin: Negative for rash and wound.  Neurological: Negative for dizziness, weakness, light-headedness, numbness and headaches.  All other systems reviewed and are negative.    Physical Exam Updated Vital Signs BP 123/79 (BP Location: Right Arm)   Pulse 98   Temp 97.9 F (36.6 C) (Oral)   Resp 20   Ht '5\' 10"'  (1.778 m)  Wt 141 lb 12.8 oz (64.3 kg)   SpO2 94%   BMI 20.35 kg/m   Physical Exam  Constitutional: He is oriented to person, place, and time. He appears well-developed and well-nourished.  Chronically ill-appearing  HENT:  Head: Normocephalic and atraumatic.  Mouth/Throat: Oropharynx is clear and moist. No oropharyngeal exudate.  Eyes: EOM are normal. Pupils are equal, round, and reactive to light.  Neck: Normal range of motion. Neck supple. No JVD present.  Cardiovascular: Regular rhythm.   Tachycardia  Pulmonary/Chest: He has rales.  Increased respiratory effort. Diffuse rhonchi. Port in right upper chest. No definite warmth or erythema.  Abdominal: Soft. Bowel sounds are normal. There is no tenderness. There is no rebound and no guarding.  Musculoskeletal: Normal range of motion. He exhibits edema. He exhibits no tenderness.  2+ bilateral pitting edema to mid calves.  Neurological: He is alert and oriented to person, place, and time.  Moving all extremities without focal deficit. Sensation intact.  Skin: Skin is warm and dry. Capillary refill takes less than 2  seconds. No rash noted. No erythema.  Psychiatric: He has a normal mood and affect. His behavior is normal.  Nursing note and vitals reviewed.    ED Treatments / Results  Labs (all labs ordered are listed, but only abnormal results are displayed) Labs Reviewed  COMPREHENSIVE METABOLIC PANEL - Abnormal; Notable for the following:       Result Value   Sodium 129 (*)    Chloride 94 (*)    Glucose, Bld 127 (*)    Calcium 7.9 (*)    Albumin 2.7 (*)    ALT 16 (*)    All other components within normal limits  CBC WITH DIFFERENTIAL/PLATELET - Abnormal; Notable for the following:    WBC 2.7 (*)    Hemoglobin 12.4 (*)    HCT 37.6 (*)    Lymphs Abs 0.3 (*)    All other components within normal limits  URINALYSIS, ROUTINE W REFLEX MICROSCOPIC (NOT AT [facility]) - Abnormal; Notable for the following:    Color, Urine AMBER (*)    APPearance HAZY (*)    All other components within normal limits  BLOOD GAS, ARTERIAL - Abnormal; Notable for the following:    pCO2 arterial 49.3 (*)    Bicarbonate 30.0 (*)    Acid-Base Excess 6.8 (*)    All other components within normal limits  CULTURE, BLOOD (ROUTINE X 2)  CULTURE, BLOOD (ROUTINE X 2)  URINE CULTURE  BRAIN NATRIURETIC PEPTIDE  I-STAT CG4 LACTIC ACID, ED  I-STAT TROPOININ, ED  I-STAT CG4 LACTIC ACID, ED  I-STAT CG4 LACTIC ACID, ED  Randolm Idol, ED    EKG  EKG Interpretation  Date/Time:  Thursday April 27 2016 20:07:07 EDT Ventricular Rate:  103 PR Interval:    QRS Duration: 103 QT Interval:  355 QTC Calculation: 465 R Axis:   115 Text Interpretation:  Sinus tachycardia Atrial premature complex Consider right ventricular hypertrophy Nonspecific T abnrm, anterolateral leads ST elevation, consider inferior injury Confirmed by Simya Tercero  MD, Samarra Ridgely ([id number]) on 04/27/2016 8:33:44 PM       Radiology Ct Angio Chest Pe W And/or Wo Contrast  Result Date: 04/27/2016 CLINICAL DATA:  Increasing shortness of breath in a patient with  stage IV lung cancer and recent injectable port placement. EXAM: CT ANGIOGRAPHY CHEST WITH CONTRAST TECHNIQUE: Multidetector CT imaging of the chest was performed using the standard protocol during bolus administration of intravenous contrast. Multiplanar CT image reconstructions and MIPs  were obtained to evaluate the vascular anatomy. CONTRAST:  100 cc Isovue 370. COMPARISON:  PET-CT 03/24/2016 FINDINGS: There are bilateral mediastinal metastatic lymph nodes with stable appearance. The index right peritracheal lymph node measuring 1.4 cm in short axis is stable. Enlarged right internal mammary chain lymph nodes also seen. The heart is normal in size. There is no pericardial effusion. No evidence of pulmonary embolus or thoracic dissection. There has been interval placement of right internal jugular approach injectable port which terminates in the superior vena cava. No fluid collections as seen surrounding the subcutaneous portion of the port. There is diffuse thickening of the distal esophagus. Stable numerous pleural-based spiculated pulmonary masses, consistent with the given history of lung cancer. Advanced upper lobe predominant emphysema. Stable sclerotic changes within right-sided T5 vertebral body and right seventh rib. Review of the MIP images confirms the above findings. IMPRESSION: No evidence of abscess formation or inflammatory fat stranding at the injectable portion of the tunneled right chest wall injectable port. Stable findings of metastatic lung cancer with bony and chest wall invasion. Advanced emphysematous changes of the lungs. Nearly appreciated diffuse thickening of the distal esophagus. This may represent matted posterior mediastinal lymphadenopathy, however primary esophageal pathology cannot be excluded. Please correlate clinically. Electronically Signed   By: Fidela Salisbury M.D.   On: 04/27/2016 22:53   Dg Chest Port 1 View  Result Date: 04/27/2016 CLINICAL DATA:  Pt states he is  having some sob onset today. Code sepsis. Nurse notes: Pt with stage 4 lung Ca has a new port to his chest that is reddened and appears new. EXAM: PORTABLE CHEST 1 VIEW COMPARISON:  01/05/2016, 03/24/2016, 03/13/2016 FINDINGS: Right-sided PowerPort tip overlies the level of the lower superior vena cava. The lungs are hyperinflated. Prominent right hilar region consistent with mediastinal and chest wall masses. There are no new consolidations or pleural effusions. No pulmonary edema. IMPRESSION: 1. Right power port appears unremarkable.  No pneumothorax. 2. Stable appearance of the chest.  No new consolidations. Electronically Signed   By: Nolon Nations M.D.   On: 04/27/2016 20:51    Procedures Procedures (including critical care time)  Medications Ordered in ED Medications  morphine (MS CONTIN) 12 hr tablet 15 mg (15 mg Oral Given 04/28/16 1053)  morphine (MSIR) tablet 15 mg (not administered)  sodium chloride flush (NS) 0.9 % injection 3 mL (0 mLs Intravenous Duplicate 04/28/90 4782)  sodium chloride flush (NS) 0.9 % injection 3 mL (3 mLs Intravenous Given 04/28/16 1054)  sodium chloride flush (NS) 0.9 % injection 3 mL (not administered)  0.9 %  sodium chloride infusion (not administered)  ondansetron (ZOFRAN) tablet 4 mg (not administered)    Or  ondansetron (ZOFRAN) injection 4 mg (not administered)  albuterol (PROVENTIL) (2.5 MG/3ML) 0.083% nebulizer solution 2.5 mg (not administered)  guaiFENesin (MUCINEX) 12 hr tablet 600 mg (600 mg Oral Given 04/28/16 1053)  methylPREDNISolone sodium succinate (SOLU-MEDROL) 125 mg/2 mL injection 80 mg (80 mg Intravenous Given 04/28/16 1054)  ipratropium-albuterol (DUONEB) 0.5-2.5 (3) MG/3ML nebulizer solution 3 mL (3 mLs Nebulization Given 04/28/16 1348)  levofloxacin (LEVAQUIN) IVPB 750 mg (not administered)  feeding supplement (ENSURE ENLIVE) (ENSURE ENLIVE) liquid 237 mL (not administered)  iopamidol (ISOVUE-370) 76 % injection 100 mL (100 mLs  Intravenous Contrast Given 04/27/16 2204)  ipratropium (ATROVENT) nebulizer solution 0.5 mg (0.5 mg Nebulization Given 04/27/16 2350)  methylPREDNISolone sodium succinate (SOLU-MEDROL) 125 mg/2 mL injection 125 mg (125 mg Intravenous Given 04/27/16 2356)  levofloxacin (LEVAQUIN) IVPB 750 mg (  750 mg Intravenous New Bag/Given 04/28/16 0019)     Initial Impression / Assessment and Plan / ED Course  I have reviewed the triage vital signs and the nursing notes.  Pertinent labs & imaging results that were available during my care of the patient were reviewed by me and considered in my medical decision making (see chart for details).  Clinical Course  Comment By Time  Pt reevaluated and still very hypoxic - has some wheezing, tachypnea and resp distress / tachycardia.  Will need nebs / steroids / admission - CT shows that there is pulm met disease Noemi Chapel, MD 08/10 2340  D/w Dr. Shanon Brow - she will admit to hospital Levaquin requested Noemi Chapel, MD 08/10 2355    Patient with lung cancer and metastases presents with worsening shortness of breath. Rhonchorous breath sounds. Hypoxia. Differential includes pneumonia versus pulmonary edema versus PE versus COPD exacerbation. Chest x-ray without any obvious findings. Normal lactic acid. We'll get CT angiogram of the chest to rule out PE. Will need admission. Discussed with Dr. Sabra Heck who will follow-up on the patient's CT and reevaluate the patient for admission.  Final Clinical Impressions(s) / ED Diagnoses   Final diagnoses:  Respiratory distress  COPD exacerbation [facility])    New Prescriptions Current Discharge Medication List       Julianne Rice, MD 04/28/16 1551

## 2016-04-27 NOTE — ED Notes (Signed)
Pt with a wet productive cough- He states that he continues to smoke, but not as much

## 2016-04-27 NOTE — ED Notes (Signed)
Pt with stage 4 lung Ca has a new port to his chest that is reddened and appears new. He reports no lining will

## 2016-04-27 NOTE — ED Notes (Signed)
From CT 

## 2016-04-27 NOTE — ED Notes (Signed)
Pt is in CT currently

## 2016-04-27 NOTE — ED Notes (Signed)
Call to respiratory

## 2016-04-27 NOTE — ED Notes (Signed)
Asked pt again if he could give a urine sample and pt stated he could not.

## 2016-04-28 ENCOUNTER — Other Ambulatory Visit (HOSPITAL_COMMUNITY): Payer: 59

## 2016-04-28 ENCOUNTER — Ambulatory Visit (HOSPITAL_COMMUNITY): Payer: 59 | Admitting: Oncology

## 2016-04-28 ENCOUNTER — Encounter (HOSPITAL_COMMUNITY): Payer: Self-pay | Admitting: *Deleted

## 2016-04-28 DIAGNOSIS — E43 Unspecified severe protein-calorie malnutrition: Secondary | ICD-10-CM | POA: Insufficient documentation

## 2016-04-28 DIAGNOSIS — J9601 Acute respiratory failure with hypoxia: Secondary | ICD-10-CM

## 2016-04-28 DIAGNOSIS — J449 Chronic obstructive pulmonary disease, unspecified: Secondary | ICD-10-CM | POA: Diagnosis present

## 2016-04-28 DIAGNOSIS — J441 Chronic obstructive pulmonary disease with (acute) exacerbation: Principal | ICD-10-CM

## 2016-04-28 DIAGNOSIS — K743 Primary biliary cirrhosis: Secondary | ICD-10-CM

## 2016-04-28 LAB — URINALYSIS, ROUTINE W REFLEX MICROSCOPIC
BILIRUBIN URINE: NEGATIVE
Glucose, UA: NEGATIVE mg/dL
Hgb urine dipstick: NEGATIVE
Ketones, ur: NEGATIVE mg/dL
Leukocytes, UA: NEGATIVE
NITRITE: NEGATIVE
Protein, ur: NEGATIVE mg/dL
SPECIFIC GRAVITY, URINE: 1.01 (ref 1.005–1.030)
pH: 7 (ref 5.0–8.0)

## 2016-04-28 LAB — TROPONIN I: Troponin I: <0.03 ng/mL (ref <0.03)

## 2016-04-28 MED ORDER — MORPHINE SULFATE ER 15 MG PO TBCR
15.0000 mg | EXTENDED_RELEASE_TABLET | Freq: Two times a day (BID) | ORAL | Status: DC
  Administered 2016-04-28 – 2016-04-29 (×4): 15 mg via ORAL
  Filled 2016-04-28 (×4): qty 1

## 2016-04-28 MED ORDER — SODIUM CHLORIDE 0.9% FLUSH
3.0000 mL | Freq: Two times a day (BID) | INTRAVENOUS | Status: DC
  Administered 2016-04-28 – 2016-04-29 (×2): 3 mL via INTRAVENOUS

## 2016-04-28 MED ORDER — IPRATROPIUM-ALBUTEROL 0.5-2.5 (3) MG/3ML IN SOLN
3.0000 mL | Freq: Four times a day (QID) | RESPIRATORY_TRACT | Status: DC
  Administered 2016-04-28 – 2016-04-29 (×6): 3 mL via RESPIRATORY_TRACT
  Filled 2016-04-28 (×6): qty 3

## 2016-04-28 MED ORDER — MORPHINE SULFATE 15 MG PO TABS
15.0000 mg | ORAL_TABLET | ORAL | Status: DC | PRN
  Administered 2016-04-28: 15 mg via ORAL
  Filled 2016-04-28: qty 1

## 2016-04-28 MED ORDER — IPRATROPIUM BROMIDE 0.02 % IN SOLN: 0.5000 mg | Freq: Four times a day (QID) | RESPIRATORY_TRACT | Status: DC

## 2016-04-28 MED ORDER — ALBUTEROL SULFATE (2.5 MG/3ML) 0.083% IN NEBU: 2.5000 mg | INHALATION_SOLUTION | RESPIRATORY_TRACT | Status: DC | PRN

## 2016-04-28 MED ORDER — METHYLPREDNISOLONE SODIUM SUCC 125 MG IJ SOLR: 80.0000 mg | Freq: Two times a day (BID) | INTRAMUSCULAR | Status: DC

## 2016-04-28 MED ORDER — ALBUTEROL SULFATE (2.5 MG/3ML) 0.083% IN NEBU: 2.5000 mg | INHALATION_SOLUTION | Freq: Four times a day (QID) | RESPIRATORY_TRACT | Status: DC

## 2016-04-28 MED ORDER — GUAIFENESIN ER 600 MG PO TB12
600.0000 mg | ORAL_TABLET | Freq: Two times a day (BID) | ORAL | Status: DC
  Administered 2016-04-28 – 2016-04-29 (×4): 600 mg via ORAL
  Filled 2016-04-28 (×4): qty 1

## 2016-04-28 MED ORDER — METHYLPREDNISOLONE SODIUM SUCC 125 MG IJ SOLR
80.0000 mg | Freq: Two times a day (BID) | INTRAMUSCULAR | Status: DC
  Administered 2016-04-28 – 2016-04-29 (×3): 80 mg via INTRAVENOUS
  Filled 2016-04-28 (×3): qty 2

## 2016-04-28 MED ORDER — ALBUTEROL SULFATE (2.5 MG/3ML) 0.083% IN NEBU: 2.5000 mg | INHALATION_SOLUTION | RESPIRATORY_TRACT | Status: DC

## 2016-04-28 MED ORDER — MORPHINE SULFATE (PF) 2 MG/ML IV SOLN: 2.0000 mg | Freq: Once | INTRAVENOUS | Status: DC

## 2016-04-28 MED ORDER — ENSURE ENLIVE PO LIQD
237.0000 mL | Freq: Two times a day (BID) | ORAL | Status: DC
  Administered 2016-04-29 (×2): 237 mL via ORAL

## 2016-04-28 MED ORDER — ONDANSETRON HCL 4 MG/2ML IJ SOLN: 4.0000 mg | Freq: Four times a day (QID) | INTRAMUSCULAR | Status: DC | PRN

## 2016-04-28 MED ORDER — SODIUM CHLORIDE 0.9% FLUSH: 3.0000 mL | INTRAVENOUS | Status: DC | PRN

## 2016-04-28 MED ORDER — ONDANSETRON HCL 4 MG PO TABS: 4.0000 mg | ORAL_TABLET | Freq: Four times a day (QID) | ORAL | Status: DC | PRN

## 2016-04-28 MED ORDER — SODIUM CHLORIDE 0.9% FLUSH
3.0000 mL | Freq: Two times a day (BID) | INTRAVENOUS | Status: DC
  Administered 2016-04-28 – 2016-04-29 (×3): 3 mL via INTRAVENOUS

## 2016-04-28 MED ORDER — SODIUM CHLORIDE 0.9 % IV SOLN: 250.0000 mL | INTRAVENOUS | Status: DC | PRN

## 2016-04-28 MED ORDER — LEVOFLOXACIN IN D5W 750 MG/150ML IV SOLN
750.0000 mg | INTRAVENOUS | Status: DC
  Administered 2016-04-28: 750 mg via INTRAVENOUS
  Filled 2016-04-28: qty 150

## 2016-04-28 NOTE — ED Notes (Signed)
Report to floor. Floor unable to take this patient while he is receiving continuous neb treatment. Pt will go to floor when treatment is finished

## 2016-04-28 NOTE — Care Management Note (Signed)
Case Management Note  Patient Details  Name: Jeffrey Gutierrez MRN: [id number] Date of Birth: 1955-10-08  Subjective/Objective:  Patient from home, Ind with ADL's. Works in [employer]. Has Stage IV lung cancer for which he reports he will be starting chemo next week in [place]. He has a PCP and insurance, reports no issues.                Action/Plan: Anticipate DC home with self care, will need O2 assessment done if not weaned of oxygen prior to discharge. Spoke with patient about oxygen at home, if needed he is agreeable. Would want to use [facility] for his oxygen.   Expected Discharge Date:                  Expected Discharge Plan:  Home/Self Care  In-House Referral:  NA  Discharge planning Services  CM Consult  Post Acute Care Choice:  NA Choice offered to:  NA  DME Arranged:    DME Agency:     HH Arranged:    HH Agency:     Status of Service:  Completed, signed off  If discussed at [employer] of Stay Meetings, dates discussed:    Additional Comments:  Mishayla Sliwinski, Chauncey Reading, RN 04/28/2016, 8:58 AM

## 2016-04-28 NOTE — Progress Notes (Signed)
Pharmacy Antibiotic Note  Jeffrey Gutierrez is a 60 y.o. male admitted on 04/27/2016 with pneumonia.  Pharmacy has been consulted for levofloxacin dosing.  Plan: - Levofloxacin 750 mg IV q24h  Height: '5\' 10"'$  (177.8 cm) Weight: 141 lb 12.8 oz (64.3 kg) IBW/kg (Calculated) : 73  Temp (24hrs), Avg:98.3 F (36.8 C), Min:97.9 F (36.6 C), Max:98.7 F (37.1 C)   Recent Labs Lab 04/27/16 2020 04/27/16 2030 04/27/16 2347  WBC 2.7*  --   --   CREATININE 0.64  --   --   LATICACIDVEN  --  0.56 0.83    Estimated Creatinine Clearance: 91.5 mL/min (by C-G formula based on SCr of 0.8 mg/dL).    Allergies  Allergen Reactions  . Dihydrocodeine Anaphylaxis  . Hydrocodone Anaphylaxis and Other (See Comments)    Throat closes up  Patient states PCP gave 11/2015 and he has been tolerating it.  . Doxycycline Nausea And Vomiting  . Linzess [Linaclotide] Diarrhea    Antimicrobials this admission: Levofloxacin  04/28/16 >>   Dose adjustments this admission:   Microbiology results:  BCx:   UCx:    Sputum:    MRSA PCR:   Thank you for allowing pharmacy to be a part of this patient's care.  Vonda Antigua 04/28/2016 1:39 AM

## 2016-04-28 NOTE — Progress Notes (Signed)
Initial Nutrition Assessment  DOCUMENTATION CODES:  Severe malnutrition in context of chronic illness   Pt meets criteria for SEVERE MALNUTRITION in the context of CHRONIC ILLNESS as evidenced by Loss of >20% bw in 1 year, a suspected oral intake that met < or equal to 75% of needs for > or equal to 1 month, and severe body fat wasting.  INTERVENTION:  Ensure Enlive po BID, each supplement provides 350 kcal and 20 grams of protein  Magic cup BID with meals, each supplement provides 290 kcal and 9 grams of protein  Inquire about diet advancement  Gave basic diet education for oncology w/ associated handouts.   NUTRITION DIAGNOSIS:  Increased nutrient needs related to cancer and cancer related treatments as evidenced by the estimated nutritional requirements for these conditions and therapies.   GOAL:  Patient will meet greater than or equal to 90% of their needs  MONITOR:  PO intake, Supplement acceptance, Diet advancement, Labs, Weight trends, I & O's  REASON FOR ASSESSMENT:  Consult Assessment of nutrition requirement/status  ASSESSMENT:  60 y/o male PMHx COPD, Primary biliary cirrhosis, GERD, HTN, diverticulitis and recent diagnosis of stage 4 lung cancer s/p xrt to chest. Presents with 2 days wheezing SOB, Cough. Admitted for COPD exacerbation.  Per chart review of recent months, pt has noted early satiety and occasional  trouble swallowing  Pt ate 50% of his breakfast.   Spoke with pt and family.   Pt states that his biggest issue is early satiety/poor appetite. He says he was started on Marinol on Thursday and he thinks "that may be what brought me here". He reports taking the marinol, eating a larger than typical meal, and then experiencing the cough/SOB shortly after.   His meal habits are as follows. He eats typically 2 meals a day; he does not like Breakfast. In addition, he consume 2 boost supplements a day. He does not like Ensure, but admits he has not tried every  flavor.  He typically eats a half a sandwich and drinks a 16 oz beverage for lunch. His sandwich contains meat and condiments and his beverage is typically soda. His dinners vary. He has Roasted pork w/ macaroni and cheese yesterday and Steak w/ potatoes the day prior.   He says he tolerate xrt very well. He has chemo soon. His pain is fairly well controlled.   Denies n/v. Has transient constipation and diarrhea. He takes miralax at home to good effect.   He states that he has not met with a Microbiologist at [facility] Yet. RD went over nutrition during cancer treatment.RD asked pt to prioritize eating high kcal/protein foods as able and eating frequently. If he is to choose lower pro/calorie foods such as fruits or vegetables he should add a high calorie condiment/sauce, such as cheese sauce, caramel, or PB to them. Acknowledged that the quality of his meals is already ideal, he just needs to address the quantity. He should be eating 4-5 small meals each day; 2 meals will not be sufficient.   RD reccommended protein powder and taught how to effectively use it.  Very slight amounts of Unflavored whey protein powder should be added to his liquids: soups, cereal, drinks, hot cereals. It can even be sprinkled on solids items. The idea is to add small enough amount that he does not notice it is there and thereby not altering his foods. If he tastes the grittiness, this is too much. Explained this may only add 20-30 kcals and a few  grams of protein, but this will add up over time. Family states there were thinking of doing this.   Both family members and patient seemed to be receptive to recommendations. Multiple family members were present and they all appear very supportive as many of them stated how they were going prepare and deliver high calorie meals to the pt.   RD left handout titled "Increasng calories and Protein" as well as coupons for Ensure/Boost.  While he is admitted, will order ensure Enlive BID and  Magic BID. He says he does not know why he is on a FL diet and says he would eat better if it was advanced. RD will ask MD about diet advancement.   Pt weighed 198 lbs at this time last year. He has lost >20% of his bw x 1 year. He appears to still be losing weight.   NFPE: He has noticeable severe fat & muscle wasting  Medications: Abx. Morphine, prednisone Labs reviewed: Albumin: 2.7, Glu: 127,   Recent Labs Lab 04/27/16 2020  NA 129*  K 4.2  CL 94*  CO2 30  BUN 11  CREATININE 0.64  CALCIUM 7.9*  GLUCOSE 127*   Diet Order:  Diet full liquid Room service appropriate? Yes; Fluid consistency: Thin  Skin:  Reviewed, no issues  Last BM:  Unknown  Height:  Ht Readings from Last 1 Encounters:  04/27/16 '5\' 10"'  (1.778 m)   Weight:  Wt Readings from Last 1 Encounters:  04/28/16 141 lb 12.8 oz (64.3 kg)   Wt Readings from Last 10 Encounters:  04/28/16 141 lb 12.8 oz (64.3 kg)  04/21/16 147 lb (66.7 kg)  04/14/16 147 lb 11.2 oz (67 kg)  04/13/16 149 lb 8 oz (67.8 kg)  04/06/16 142 lb 6.4 oz (64.6 kg)  03/31/16 142 lb 6.4 oz (64.6 kg)  03/30/16 144 lb 1.6 oz (65.4 kg)  03/24/16 148 lb (67.1 kg)  01/24/16 156 lb 6.4 oz (70.9 kg)  12/21/15 158 lb 9.6 oz (71.9 kg)   Ideal Body Weight:  75.45 kg  BMI:  Body mass index is 20.35 kg/m.  Estimated Nutritional Needs:  Kcal:  2250-2450 kcals (35-38 kcal/kg bw) Protein:  90-103 g g (1.4-1.6 g/kg bw) Fluid:  >2 liters (30 mls/kg bw)  EDUCATION NEEDS:  Education needs addressed  Burtis Junes RD, LDN, CNSC Clinical Nutrition Pager: [phone] 04/28/2016 12:59 PM

## 2016-04-28 NOTE — ED Notes (Signed)
Troponin per report 0.01 Lactic acid 1.51

## 2016-04-28 NOTE — Progress Notes (Signed)
Patient discharging to [facility].  Report called and given to nurse at facility [facility].  All questions answered.  Daughter and patient aware and in agreement.  Packet sent with patient.  Assisted off floor via WC in NAD.

## 2016-04-28 NOTE — H&P (Signed)
History and Physical    CORDEL DREWES VEH:[id number] DOB: 06/15/1956 DOA: 04/27/2016  PCP: Purvis Kilts, MD  Patient coming from: home  Chief Complaint: sob, cough  HPI: Jeffrey Gutierrez is a 60 y.o. male with medical history significant of stage 4 lung cancer , copd comes in with 2 days of wheezing, sob and cough.  No fevers.  Pt just finished his round of XRT to chest and is going to start his chemo at [facility] next week.  Was wheezing on arrival and hypoxic.  Given nebs and steroids and feels better.  Pt referred for admission for his copde and hypoxia.  Review of Systems: As per HPI otherwise 10 point review of systems negative.   Past Medical History:  Diagnosis Date  . Bone cancer ([place])   . COPD (chronic obstructive pulmonary disease) ([place])   . Diverticulitis   . Essential hypertension   . GERD (gastroesophageal reflux disease)   . Hiatal hernia   . Hyperplastic rectal polyp   . Lung cancer ([employer])   . PBC (primary biliary cirrhosis) ([place])    Diagnosed 2006  . S/P colonoscopy 2009   Pancolonic diverticula, hyperplastic rectal polyps    Past Surgical History:  Procedure Laterality Date  . Arm Surgery Left    ulnar nerve release  . CHOLECYSTECTOMY    . COLONOSCOPY  2009   Pancolonic diverticula, hyperplastic rectal polyps  . COLONOSCOPY [name] 03/24/2015   RMR: Single colonic polyp removed as described above. Pancolonic diverticulosis. Abnormal sigmoid colon consistent with stigmata of recent diverticulitis.   Marland Kitchen ESOPHAGOGASTRODUODENOSCOPY [name] 06/28/2015   RMR: mild erosive reflux/small HH  . INGUINAL HERNIA REPAIR Right Feb 2012   Dr. Arnoldo Morale   . INGUINAL HERNIA REPAIR Left 11/12/2015   Procedure: HERNIA REPAIR INGUINAL ADULT WITH MESH;  Surgeon: Aviva Signs, MD;  Location: [facility];  Service: General;  Laterality: Left;  . INSERTION OF MESH Left 11/12/2015   Procedure: INSERTION OF MESH;  Surgeon: Aviva Signs, MD;  Location: [facility];  Service: General;  Laterality:  Left;  . LIVER BIOPSY    . NASAL SEPTUM SURGERY       reports that he has been smoking Cigarettes.  He has a 21.50 pack-year smoking history. He has never used smokeless tobacco. He reports that he drinks alcohol. He reports that he does not use drugs.  Allergies  Allergen Reactions  . Dihydrocodeine Anaphylaxis  . Hydrocodone Anaphylaxis and Other (See Comments)    Throat closes up  Patient states PCP gave 11/2015 and he has been tolerating it.  . Doxycycline Nausea And Vomiting  . Linzess [Linaclotide] Diarrhea    Family History  Problem Relation Age of Onset  . Lung cancer Mother   . Pneumonia Father   . Lung cancer Father   . Pancreatitis Father     Prior to Admission medications   Medication Sig Start Date End Date Taking? Authorizing Provider  acetaminophen (TYLENOL) 500 MG tablet Take 1,000 mg by mouth every 8 (eight) hours as needed for moderate pain or headache.    Yes Historical Provider, MD  albuterol (PROVENTIL HFA;VENTOLIN HFA) 108 (90 Base) MCG/ACT inhaler Inhale 2 puffs into the lungs every 4 (four) hours as needed for wheezing or shortness of breath. 09/17/15  Yes Merrily Pew, MD  budesonide-formoterol [facility]) 160-4.5 MCG/ACT inhaler Inhale 2 puffs into the lungs 2 (two) times daily.   Yes Historical Provider, MD  dronabinol (MARINOL) 5 MG capsule Take 1 capsule by  mouth 2 (two) times daily. 04/26/16  Yes Historical Provider, MD  DULoxetine (CYMBALTA) 30 MG capsule Take 30 mg by mouth 2 (two) times daily.  02/23/16  Yes Historical Provider, MD  ibuprofen (ADVIL,MOTRIN) 200 MG tablet Take 200 mg by mouth every 6 (six) hours as needed for mild pain or moderate pain. Reported on 12/14/2015   Yes Historical Provider, MD  morphine (MS CONTIN) 15 MG 12 hr tablet Take 1 tablet (15 mg total) by mouth every 12 (twelve) hours. 04/13/16  Yes Patrici Ranks, MD  morphine (MSIR) 15 MG tablet Take 1 tablet (15 mg total) by mouth every 4 (four) hours as needed for severe pain.  04/13/16  Yes Patrici Ranks, MD  OCALIVA 5 MG TABS Take 1 tablet by mouth daily. Patient taking differently: Take 5 mg by mouth daily.  03/02/16  Yes Orvil Feil, NP  omeprazole (PRILOSEC) 20 MG capsule Take 1 capsule (20 mg total) by mouth daily. 01/24/16  Yes Mahala Menghini, PA-C  ondansetron (ZOFRAN) 8 MG tablet Take 1 tablet (8 mg total) by mouth every 8 (eight) hours as needed for nausea or vomiting. 04/03/16  Yes Patrici Ranks, MD  polyethylene glycol (MIRALAX / GLYCOLAX) packet Take 34 g by mouth 2 (two) times daily.    Yes Historical Provider, MD  tiotropium (SPIRIVA) 18 MCG inhalation capsule Place 18 mcg into inhaler and inhale daily.   Yes Historical Provider, MD  nystatin (MYCOSTATIN) 100000 UNIT/ML suspension Take 5 mLs (500,000 Units total) by mouth 4 (four) times daily. Patient not taking: Reported on 04/27/2016 04/03/16   Patrici Ranks, MD  sucralfate (CARAFATE) 1 g tablet Take 1 tablet (1 g total) by mouth 4 (four) times daily -  with meals and at bedtime. Patient not taking: Reported on 04/27/2016 04/14/16   Hayden Pedro, PA-C  Wound Dressings (SONAFINE EX) Apply topically.    Historical Provider, MD    Physical Exam: Vitals:   04/27/16 2230 04/27/16 2300 04/28/16 0000 04/28/16 0030  BP: 127/90 132/83 129/81 120/76  Pulse: 102 103 101 111  Resp: '23 20 23 24  '$ Temp:      TempSrc:      SpO2: 93% 94% 97% 94%  Weight:      Height:          Constitutional: NAD, calm, comfortable Vitals:   04/27/16 2230 04/27/16 2300 04/28/16 0000 04/28/16 0030  BP: 127/90 132/83 129/81 120/76  Pulse: 102 103 101 111  Resp: '23 20 23 24  '$ Temp:      TempSrc:      SpO2: 93% 94% 97% 94%  Weight:      Height:       Eyes: PERRL, lids and conjunctivae normal ENMT: Mucous membranes are moist. Posterior pharynx clear of any exudate or lesions.Normal dentition.  Neck: normal, supple, no masses, no thyromegaly Respiratory: diminished bilaterally, no wheezing, no crackles.  Normal respiratory effort. No accessory muscle use.  Cardiovascular: Regular rate and rhythm, no murmurs / rubs / gallops. No extremity edema. 2+ pedal pulses. No carotid bruits.  Abdomen: no tenderness, no masses palpated. No hepatosplenomegaly. Bowel sounds positive.  Musculoskeletal: no clubbing / cyanosis. No joint deformity upper and lower extremities. Good ROM, no contractures. Normal muscle tone.  Skin: no rashes, lesions, ulcers. No induration Neurologic: CN 2-12 grossly intact. Sensation intact, DTR normal. Strength 5/5 in all 4.  Psychiatric: Normal judgment and insight. Alert and oriented x 3. Normal mood.    Labs  on Admission: I have personally reviewed following labs and imaging studies  CBC:  Recent Labs Lab 04/27/16 2020  WBC 2.7*  NEUTROABS 1.9  HGB 12.4*  HCT 37.6*  MCV 88.1  PLT 161   Basic Metabolic Panel:  Recent Labs Lab 04/27/16 2020  NA 129*  K 4.2  CL 94*  CO2 30  GLUCOSE 127*  BUN 11  CREATININE 0.64  CALCIUM 7.9*   GFR: Estimated Creatinine Clearance: 93 mL/min (by C-G formula based on SCr of 0.8 mg/dL). Liver Function Tests:  Recent Labs Lab 04/27/16 2020  AST 23  ALT 16*  ALKPHOS 86  BILITOT 0.5  PROT 6.8  ALBUMIN 2.7*   Urine analysis:    Component Value Date/Time   COLORURINE AMBER (A) 04/27/2016 2344   APPEARANCEUR HAZY (A) 04/27/2016 2344   LABSPEC 1.010 04/27/2016 2344   PHURINE 7.0 04/27/2016 2344   GLUCOSEU NEGATIVE 04/27/2016 2344   HGBUR NEGATIVE 04/27/2016 2344   [place] NEGATIVE 04/27/2016 2344   KETONESUR NEGATIVE 04/27/2016 2344   PROTEINUR NEGATIVE 04/27/2016 2344   NITRITE NEGATIVE 04/27/2016 2344   LEUKOCYTESUR NEGATIVE 04/27/2016 2344    Recent Results (from the past 240 hour(s))  Blood Culture (routine x 2)     Status: None (Preliminary result)   Collection Time: 04/27/16  8:20 PM  Result Value Ref Range Status   Specimen Description RIGHT ANTECUBITAL  Final   Special Requests BOTTLES DRAWN  AEROBIC AND ANAEROBIC 6CC  Final   Culture PENDING  Incomplete   Report Status PENDING  Incomplete  Blood Culture (routine x 2)     Status: None (Preliminary result)   Collection Time: 04/27/16  8:33 PM  Result Value Ref Range Status   Specimen Description LEFT ANTECUBITAL  Final   Special Requests BOTTLES DRAWN AEROBIC AND ANAEROBIC [place]  Final   Culture PENDING  Incomplete   Report Status PENDING  Incomplete     Radiological Exams on Admission: Ct Angio Chest Pe W And/or Wo Contrast  Result Date: 04/27/2016 CLINICAL DATA:  Increasing shortness of breath in a patient with stage IV lung cancer and recent injectable port placement. EXAM: CT ANGIOGRAPHY CHEST WITH CONTRAST TECHNIQUE: Multidetector CT imaging of the chest was performed using the standard protocol during bolus administration of intravenous contrast. Multiplanar CT image reconstructions and MIPs were obtained to evaluate the vascular anatomy. CONTRAST:  100 cc Isovue 370. COMPARISON:  PET-CT 03/24/2016 FINDINGS: There are bilateral mediastinal metastatic lymph nodes with stable appearance. The index right peritracheal lymph node measuring 1.4 cm in short axis is stable. Enlarged right internal mammary chain lymph nodes also seen. The heart is normal in size. There is no pericardial effusion. No evidence of pulmonary embolus or thoracic dissection. There has been interval placement of right internal jugular approach injectable port which terminates in the superior vena cava. No fluid collections as seen surrounding the subcutaneous portion of the port. There is diffuse thickening of the distal esophagus. Stable numerous pleural-based spiculated pulmonary masses, consistent with the given history of lung cancer. Advanced upper lobe predominant emphysema. Stable sclerotic changes within right-sided T5 vertebral body and right seventh rib. Review of the MIP images confirms the above findings. IMPRESSION: No evidence of abscess formation or  inflammatory fat stranding at the injectable portion of the tunneled right chest wall injectable port. Stable findings of metastatic lung cancer with bony and chest wall invasion. Advanced emphysematous changes of the lungs. Nearly appreciated diffuse thickening of the distal esophagus. This may represent  matted posterior mediastinal lymphadenopathy, however primary esophageal pathology cannot be excluded. Please correlate clinically. Electronically Signed   By: Fidela Salisbury M.D.   On: 04/27/2016 22:53   Dg Chest Port 1 View  Result Date: 04/27/2016 CLINICAL DATA:  Pt states he is having some sob onset today. Code sepsis. Nurse notes: Pt with stage 4 lung Ca has a new port to his chest that is reddened and appears new. EXAM: PORTABLE CHEST 1 VIEW COMPARISON:  01/05/2016, 03/24/2016, 03/13/2016 FINDINGS: Right-sided PowerPort tip overlies the level of the lower superior vena cava. The lungs are hyperinflated. Prominent right hilar region consistent with mediastinal and chest wall masses. There are no new consolidations or pleural effusions. No pulmonary edema. IMPRESSION: 1. Right power port appears unremarkable.  No pneumothorax. 2. Stable appearance of the chest.  No new consolidations. Electronically Signed   By: Nolon Nations M.D.   On: 04/27/2016 20:51    Assessment/Plan 60 yo male with stage 4 lung cancer with copde  Principal Problem:   Acute respiratory failure with hypoxia ([place])- secondary to copde on top of lung cancer.  Treat copd see below.  Supplemental oxygen.  May need home o2 set up depending on how much he responds to steroids, abx.  Active Problems:   COPD exacerbation ([place])- cont iv solumedrol.  freq nebs.  Iv levaquin.  Oxygen.   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     DVT prophylaxis: scds Code Status:   Full code.  Jackelyne Sayer A MD [facility]  If 7PM-7AM,  please contact night-coverage [email or web address] Password [facility]  04/28/2016, 12:44 AM

## 2016-04-28 NOTE — Progress Notes (Signed)
Patient seen and examined, database reviewed. Admitted overnight due to shortness of breath, found to have COPD with acute exacerbation, is significantly improved, continue current dose of steroids and neb treatments. Consider discharge home in 24 hours.  Domingo Mend, MD [facility] Pager: [phone]

## 2016-04-28 NOTE — Clinical Social Work Note (Signed)
CSW received consult for COPD gold protocol. Pt does not meet criteria of 3 or more admissions in past 6 months. CSW will sign off, but can be reconsulted if needed.  Benay Pike, [place]

## 2016-04-28 NOTE — Progress Notes (Signed)
OT Cancellation Note  Patient Details Name: Jeffrey Gutierrez MRN: [id number] DOB: 1956/05/29   Cancelled Treatment:     Reason evaluation not completed: Chart reviewed, pt screened. Pt is independent with all ADL and functional mobility tasks at this time. No further OT services required. Wife is available to assist with ADL tasks if needed.   Guadelupe Sabin, OTR/L  [phone] 04/28/2016, 10:50 AM

## 2016-04-28 NOTE — Evaluation (Signed)
Physical Therapy Evaluation Patient Details Name: Jeffrey Gutierrez MRN: [id number] DOB: 1956/08/27 Today's Date: 04/28/2016   History of Present Illness  60 y.o. male with medical history significant of stage 4 lung cancer , copd comes in with 2 days of wheezing, sob and cough.  No fevers.  Pt just finished his round of XRT to chest and is going to start his chemo at [place] next week.  Was wheezing on arrival and hypoxic.  Given nebs and steroids and feels better.  Pt referred for admission for his copde and hypoxia.  CTA of chest noted metastatic lung CA with bony and chest wall invasion, advanced emphysematous changes, and diffuse thickening of the distal esophagus which may represent mediastinal lymphadenopathy.  PMH: bone CA, COPD, diverticulitis, HTN, hiatal hernia with repair, hyperplastic rectal polyp, lung CA, primary biliary cirrhosis 2006, arm surgery, cholecystectomy, liver biopsy.   Clinical Impression   Pt received in bed, and was agreeable to PT evaluation.  Pt is independent at baseline, and still working as a Visual merchandiser.  Today during PT evaluation he demonstrates all functional mobility at independent level, however SpO2 desaturated at rest to 84% while on RA, therefore placed on 1L, and he was able to improve to 90%.  He ambulated 484f on 1L, and at the end he desaturated down to 85%, but improved to 90% with seated rest after ~168m.  At this point, he does not demonstrate need for skilled PT, however he may benefit from home O2.      Follow Up Recommendations No PT follow up    Equipment Recommendations  Other (comment) (Pt may need O2 at home)    Recommendations for Other Services       Precautions / Restrictions Precautions Precautions: None Restrictions Weight Bearing Restrictions: No      Mobility  Bed Mobility Overal bed mobility: Independent                Transfers Overall transfer level: Independent                   Ambulation/Gait Ambulation/Gait assistance: Independent Ambulation Distance (Feet): 400 Feet Assistive device: None Gait Pattern/deviations: WFL(Within Functional Limits)   Gait velocity interpretation: at or above normal speed for age/gender    Stairs            Wheelchair Mobility    Modified Rankin (Stroke Patients Only)       Balance Overall balance assessment: No apparent balance deficits (not formally assessed)                                           Pertinent Vitals/Pain Pain Assessment: No/denies pain    Home Living   Living Arrangements: Spouse/significant other   Type of Home: Mobile home Home Access: Stairs to enter Entrance Stairs-Rails: Can reach both Entrance Stairs-Number of Steps: 3 Home Layout: One level Home Equipment: Tub bench [place] Pt sleeps in his recliner.     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      Hand Dominance   Dominant Hand: Right    Extremity/Trunk Assessment   Upper Extremity Assessment: Overall WFL for tasks assessed           Lower Extremity Assessment:  Overall WFL for tasks assessed         Communication   Communication: No difficulties  Cognition Arousal/Alertness: Awake/alert Behavior During Therapy: WFL for tasks assessed/performed Overall Cognitive Status: Within Functional Limits for tasks assessed                      General Comments      Exercises        Assessment/Plan    PT Assessment Patent does not need any further PT services  PT Diagnosis Difficulty walking   PT Problem List    PT Treatment Interventions     PT Goals (Current goals can be found in the Care Plan section) Acute Rehab PT Goals PT Goal Formulation: All assessment and education complete, DC therapy    Frequency     Barriers to discharge         Co-evaluation               End of Session Equipment Utilized During Treatment: Gait belt;Oxygen Activity Tolerance: Patient tolerated treatment well Patient left: in chair           Time: 0915-0940 PT Time Calculation (min) (ACUTE ONLY): 25 min   Charges:   PT Evaluation $PT Eval Low Complexity: 1 Procedure PT Treatments $Gait Training: 8-22 mins   PT G Codes:        Beth Hatim Homann, PT, DPT X: [phone]

## 2016-04-28 NOTE — Progress Notes (Addendum)
Patients O2 sats decreased to 83% while at rest on room air.  Increased to 93% with 2L O2 applied.

## 2016-04-28 NOTE — ED Provider Notes (Signed)
CT reviewed, pt still hypoxix, hx of reactive airway disease - emphysema - has need for nebs, admit - d/w Dr. Shanon Brow.   Noemi Chapel, MD 04/28/16 [phone]

## 2016-04-29 DIAGNOSIS — C3491 Malignant neoplasm of unspecified part of right bronchus or lung: Secondary | ICD-10-CM

## 2016-04-29 LAB — TROPONIN I: Troponin I: <0.03 ng/mL (ref <0.03)

## 2016-04-29 LAB — URINE CULTURE

## 2016-04-29 MED ORDER — LEVOFLOXACIN 500 MG PO TABS: 500.0000 mg | ORAL_TABLET | Freq: Every day | ORAL | 0 refills | Status: DC

## 2016-04-29 MED ORDER — PREDNISONE 10 MG PO TABS: 10.0000 mg | ORAL_TABLET | Freq: Every day | ORAL | 0 refills | Status: DC

## 2016-04-29 NOTE — Progress Notes (Signed)
Pt insisting that he leave without oxygen, despite this RN attempting to persuade him not to leave without oxygen due to safety risks. Patient and wife cursing, upset at various miscommunications that have apparently occurred over the course of the hospital visit. Patient and wife aware of risks of leaving without oxygen, including death. Pt is alert, verbal, oriented x4, coherent. Per Jesse Fall, Hydrographic surveyor, patients sister in law is supposed to meet him at his house so that he can borrow her oxygen until [place] arrives.  Dr. Jerilee Hoh aware of patient leaving hospital without oxygen. Jesse Fall, House Supervisor aware of patient leaving without oxygen as well. No additional orders provided. [facility] to deliver oxygen to patients home.   IV access removed. Discharge instructions reviewed with patient and wife. Two prescriptions given, patient instructed to fill at a pharmacy of his choice. Patient and wife verbalized understanding. Follow up appointments discussed. Pt stable at time of discharge, taken via wheelchair and oxygen to car by Nurse Tech.

## 2016-04-29 NOTE — Care Management Note (Signed)
Case Management Note  Patient Details  Name: Jeffrey Gutierrez MRN: [id number] Date of Birth: 27-Nov-1955  Subjective/Objective:                    Action/Plan:    Expected Discharge Date:                  Expected Discharge Plan:  Home/Self Care  In-House Referral:  NA  Discharge planning Services  CM Consult  Post Acute Care Choice:  Durable Medical Equipment Choice offered to:  NA, Patient  DME Arranged:  Oxygen DME Agency:  [place]:    [facility] Agency:  [place]  Status of Service:  Completed, signed off  If discussed at [place] of Stay Meetings, dates discussed:    Additional Comments:  Briant Sites, RN 04/29/2016, 12:41 PM

## 2016-04-29 NOTE — Progress Notes (Addendum)
CM faxed information to [facility] for oxygen to be delivered to hospital and setup for home. CM contacted [facility] and checked on status of delivery spoke with representative Jan and confirmed driver 14 on route for delivery confirmation#[id number] for hospital and home delivery confirmation# [id number].  Patient was wanting to discharge home and get oxygen delivered home.  Patient oxygen rate low and and was informed.

## 2016-04-29 NOTE — Discharge Summary (Signed)
Physician Discharge Summary  Jeffrey Gutierrez QIH:[id number] DOB: 21-Mar-1956 DOA: 04/27/2016  PCP: Purvis Kilts, MD  Admit date: 04/27/2016 Discharge date: 04/29/2016  Time spent: 45 minutes  Recommendations for Outpatient Follow-up:  -Will be discharged home today. -Advised to follow up with PCP in 2 weeks.   Discharge Diagnoses:  Principal Problem:   Acute respiratory failure with hypoxia ([place]) Active Problems:   Primary biliary cholangitis (HCC)   Hepatic cirrhosis ([place])   Adenocarcinoma of right lung, stage 4 (HCC)   COPD exacerbation ([place])   Protein-calorie malnutrition, severe   Discharge Condition: Stable and improved  Filed Weights   04/27/16 2008 04/28/16 0102  Weight: 65.3 kg (144 lb) 64.3 kg (141 lb 12.8 oz)    History of present illness:  As per Dr. Shanon Brow on 8/11: Jeffrey Gutierrez is a 60 y.o. male with medical history significant of stage 4 lung cancer , copd comes in with 2 days of wheezing, sob and cough.  No fevers.  Pt just finished his round of XRT to chest and is going to start his chemo at [place] next week.  Was wheezing on arrival and hypoxic.  Given nebs and steroids and feels better.  Pt referred for admission for his copde and hypoxia.   Hospital Course:   COPD with acute exacerbation/acute hypoxemic respiratory failure -Improved. -Will DC on levaquin, prednisone taper -Has a new oxygen requirement and will be discharged home on oxygen.  Stage IV Lung adenocarcinoma -Follow up with oncology as scheduled for next week for initiation of chemotherapy.  Procedures:  None   Consultations:  None  Discharge Instructions  Discharge Instructions    Increase activity slowly    Complete by:  As directed       Medication List    STOP taking these medications   acetaminophen 500 MG tablet Commonly known as:  TYLENOL   nystatin 100000 UNIT/ML suspension Commonly known as:  MYCOSTATIN   sucralfate 1 g tablet Commonly known as:   CARAFATE     TAKE these medications   albuterol 108 (90 Base) MCG/ACT inhaler Commonly known as:  PROVENTIL HFA;VENTOLIN HFA Inhale 2 puffs into the lungs every 4 (four) hours as needed for wheezing or shortness of breath.   budesonide-formoterol 160-4.5 MCG/ACT inhaler Commonly known as:  SYMBICORT Inhale 2 puffs into the lungs 2 (two) times daily.   dronabinol 5 MG capsule Commonly known as:  MARINOL Take 1 capsule by mouth 2 (two) times daily.   DULoxetine 30 MG capsule Commonly known as:  CYMBALTA Take 30 mg by mouth 2 (two) times daily.   ibuprofen 200 MG tablet Commonly known as:  ADVIL,MOTRIN Take 200 mg by mouth every 6 (six) hours as needed for mild pain or moderate pain. Reported on 12/14/2015   levofloxacin 500 MG tablet Commonly known as:  LEVAQUIN Take 1 tablet (500 mg total) by mouth daily.   morphine 15 MG tablet Commonly known as:  MSIR Take 1 tablet (15 mg total) by mouth every 4 (four) hours as needed for severe pain.   morphine 15 MG 12 hr tablet Commonly known as:  MS CONTIN Take 1 tablet (15 mg total) by mouth every 12 (twelve) hours.   OCALIVA 5 MG Tabs Generic drug:  Obeticholic Acid Take 1 tablet by mouth daily. What changed:  how much to take   omeprazole 20 MG capsule Commonly known as:  PRILOSEC Take 1 capsule (20 mg total) by mouth daily.  ondansetron 8 MG tablet Commonly known as:  ZOFRAN Take 1 tablet (8 mg total) by mouth every 8 (eight) hours as needed for nausea or vomiting.   polyethylene glycol packet Commonly known as:  MIRALAX / GLYCOLAX Take 34 g by mouth 2 (two) times daily.   predniSONE 10 MG tablet Commonly known as:  DELTASONE Take 1 tablet (10 mg total) by mouth daily with breakfast. Take 6 tablets today and then decrease by 1 tablet daily until none are left.   SONAFINE EX Apply topically.   tiotropium 18 MCG inhalation capsule Commonly known as:  SPIRIVA Place 18 mcg into inhaler and inhale daily.       Allergies  Allergen Reactions  . Dihydrocodeine Anaphylaxis  . Hydrocodone Anaphylaxis and Other (See Comments)    Throat closes up  Patient states PCP gave 11/2015 and he has been tolerating it.  . Doxycycline Nausea And Vomiting  . Linzess [Linaclotide] Diarrhea   Follow-up Information    Purvis Kilts, MD. Schedule an appointment as soon as possible for a visit in 2 week(s).   Specialty:  Family Medicine Contact information: [address] [place] [address] [phone]            The results of significant diagnostics from this hospitalization (including imaging, microbiology, ancillary and laboratory) are listed below for reference.    Significant Diagnostic Studies: Ct Angio Chest Pe W And/or Wo Contrast  Result Date: 04/27/2016 CLINICAL DATA:  Increasing shortness of breath in a patient with stage IV lung cancer and recent injectable port placement. EXAM: CT ANGIOGRAPHY CHEST WITH CONTRAST TECHNIQUE: Multidetector CT imaging of the chest was performed using the standard protocol during bolus administration of intravenous contrast. Multiplanar CT image reconstructions and MIPs were obtained to evaluate the vascular anatomy. CONTRAST:  100 cc Isovue 370. COMPARISON:  PET-CT 03/24/2016 FINDINGS: There are bilateral mediastinal metastatic lymph nodes with stable appearance. The index right peritracheal lymph node measuring 1.4 cm in short axis is stable. Enlarged right internal mammary chain lymph nodes also seen. The heart is normal in size. There is no pericardial effusion. No evidence of pulmonary embolus or thoracic dissection. There has been interval placement of right internal jugular approach injectable port which terminates in the superior vena cava. No fluid collections as seen surrounding the subcutaneous portion of the port. There is diffuse thickening of the distal esophagus. Stable numerous pleural-based spiculated pulmonary masses, consistent with the  given history of lung cancer. Advanced upper lobe predominant emphysema. Stable sclerotic changes within right-sided T5 vertebral body and right seventh rib. Review of the MIP images confirms the above findings. IMPRESSION: No evidence of abscess formation or inflammatory fat stranding at the injectable portion of the tunneled right chest wall injectable port. Stable findings of metastatic lung cancer with bony and chest wall invasion. Advanced emphysematous changes of the lungs. Nearly appreciated diffuse thickening of the distal esophagus. This may represent matted posterior mediastinal lymphadenopathy, however primary esophageal pathology cannot be excluded. Please correlate clinically. Electronically Signed   By: Fidela Salisbury M.D.   On: 04/27/2016 22:53   Mr Jeri Cos TW Contrast  Result Date: 04/12/2016 CLINICAL DATA:  Recent diagnosis of lung cancer.  Staging. EXAM: MRI HEAD WITHOUT AND WITH CONTRAST TECHNIQUE: Multiplanar, multiecho pulse sequences of the brain and surrounding structures were obtained without and with intravenous contrast. CONTRAST:  21m MULTIHANCE GADOBENATE DIMEGLUMINE 529 MG/ML IV SOLN COMPARISON:  PET scan 03/13/2016. FINDINGS: No evidence for acute infarction, hemorrhage, mass lesion, hydrocephalus,  or extra-axial fluid. Slight premature for age cerebral and cerebellar atrophy. Mild subcortical and periventricular T2 and FLAIR hyperintensities, likely chronic microvascular ischemic change. Pituitary, pineal, and cerebellar tonsils unremarkable. No upper cervical lesions. Flow voids are maintained throughout the carotid, basilar, and vertebral arteries. There are no areas of chronic hemorrhage. Post infusion, no abnormal enhancement of the brain or meninges. Major dural venous sinuses are patent. Visualized calvarium, skull base, and upper cervical osseous structures unremarkable. Scalp and extracranial soft tissues, orbits, sinuses, and mastoids show no acute process.  IMPRESSION: Mild atrophy and small vessel disease. No acute intracranial findings. No evidence for intracranial metastatic disease. Electronically Signed   By: Staci Righter M.D.   On: 04/12/2016 16:18  Ir Fluoro Guide Cv Line Right  Result Date: 04/06/2016 INDICATION: Lung cancer EXAM: IMPLANTED PORT A CATH PLACEMENT WITH ULTRASOUND AND FLUOROSCOPIC GUIDANCE MEDICATIONS: Ancef; The antibiotic was administered within an appropriate time interval prior to skin puncture. ANESTHESIA/SEDATION: Moderate (conscious) sedation was employed during this procedure. A total of Versed 2.5 mg and Fentanyl 50 mcg was administered intravenously. Moderate Sedation Time: 44 minutes. The patient's level of consciousness and vital signs were monitored continuously by radiology nursing throughout the procedure under my direct supervision. FLUOROSCOPY TIME:  One minutes, 24 seconds (10 mGy) COMPLICATIONS: None immediate. PROCEDURE: The procedure, risks, benefits, and alternatives were explained to the patient. Questions regarding the procedure were encouraged and answered. The patient understands and consents to the procedure. The right neck and chest were prepped with chlorhexidine in a sterile fashion, and a sterile drape was applied covering the operative field. Maximum barrier sterile technique with sterile gowns and gloves were used for the procedure. A timeout was performed prior to the initiation of the procedure. Local anesthesia was provided with 1% lidocaine with epinephrine. After creating a small venotomy incision, a micropuncture kit was utilized to access the internal jugular vein under direct, real-time ultrasound guidance. Ultrasound image documentation was performed. The microwire was kinked to measure appropriate catheter length. A subcutaneous port pocket was then created along the upper chest wall utilizing a combination of sharp and blunt dissection. The pocket was irrigated with sterile saline. A single lumen  ISP power injectable port was chosen for placement. The 8 Fr catheter was tunneled from the port pocket site to the venotomy incision. The port was placed in the pocket. 3-0 Ethilon stitches were utilized to secure the reservoir in the pocket. The external catheter was trimmed to appropriate length. At the venotomy, an 8 Fr peel-away sheath was placed over a guidewire under fluoroscopic guidance. The catheter was then placed through the sheath and the sheath was removed. Final catheter positioning was confirmed and documented with a fluoroscopic spot radiograph. The port was accessed with a Huber needle, aspirated and flushed with heparinized saline. The venotomy site was closed with an interrupted 4-0 Vicryl suture. The port pocket incision was closed with interrupted 2-0 Vicryl suture and the skin was opposed with a running subcuticular 4-0 Vicryl suture. Dermabond and Steri-strips were applied to both incisions. Dressings were placed. The patient tolerated the procedure well without immediate post procedural complication. IMPRESSION: Successful placement of a right internal jugular approach power injectable Port-A-Cath. The catheter is ready for immediate use. Electronically Signed   By: Marybelle Killings M.D.   On: 04/06/2016 14:58   Ir US Guide Vasc Access Right  Result Date: 04/06/2016 INDICATION: Lung cancer EXAM: IMPLANTED PORT A CATH PLACEMENT WITH ULTRASOUND AND FLUOROSCOPIC GUIDANCE MEDICATIONS: Ancef; The antibiotic was  administered within an appropriate time interval prior to skin puncture. ANESTHESIA/SEDATION: Moderate (conscious) sedation was employed during this procedure. A total of Versed 2.5 mg and Fentanyl 50 mcg was administered intravenously. Moderate Sedation Time: 44 minutes. The patient's level of consciousness and vital signs were monitored continuously by radiology nursing throughout the procedure under my direct supervision. FLUOROSCOPY TIME:  One minutes, 24 seconds (10 mGy)  COMPLICATIONS: None immediate. PROCEDURE: The procedure, risks, benefits, and alternatives were explained to the patient. Questions regarding the procedure were encouraged and answered. The patient understands and consents to the procedure. The right neck and chest were prepped with chlorhexidine in a sterile fashion, and a sterile drape was applied covering the operative field. Maximum barrier sterile technique with sterile gowns and gloves were used for the procedure. A timeout was performed prior to the initiation of the procedure. Local anesthesia was provided with 1% lidocaine with epinephrine. After creating a small venotomy incision, a micropuncture kit was utilized to access the internal jugular vein under direct, real-time ultrasound guidance. Ultrasound image documentation was performed. The microwire was kinked to measure appropriate catheter length. A subcutaneous port pocket was then created along the upper chest wall utilizing a combination of sharp and blunt dissection. The pocket was irrigated with sterile saline. A single lumen ISP power injectable port was chosen for placement. The 8 Fr catheter was tunneled from the port pocket site to the venotomy incision. The port was placed in the pocket. 3-0 Ethilon stitches were utilized to secure the reservoir in the pocket. The external catheter was trimmed to appropriate length. At the venotomy, an 8 Fr peel-away sheath was placed over a guidewire under fluoroscopic guidance. The catheter was then placed through the sheath and the sheath was removed. Final catheter positioning was confirmed and documented with a fluoroscopic spot radiograph. The port was accessed with a Huber needle, aspirated and flushed with heparinized saline. The venotomy site was closed with an interrupted 4-0 Vicryl suture. The port pocket incision was closed with interrupted 2-0 Vicryl suture and the skin was opposed with a running subcuticular 4-0 Vicryl suture. Dermabond and  Steri-strips were applied to both incisions. Dressings were placed. The patient tolerated the procedure well without immediate post procedural complication. IMPRESSION: Successful placement of a right internal jugular approach power injectable Port-A-Cath. The catheter is ready for immediate use. Electronically Signed   By: Marybelle Killings M.D.   On: 04/06/2016 14:58   Dg Chest Port 1 View  Result Date: 04/27/2016 CLINICAL DATA:  Pt states he is having some sob onset today. Code sepsis. Nurse notes: Pt with stage 4 lung Ca has a new port to his chest that is reddened and appears new. EXAM: PORTABLE CHEST 1 VIEW COMPARISON:  01/05/2016, 03/24/2016, 03/13/2016 FINDINGS: Right-sided PowerPort tip overlies the level of the lower superior vena cava. The lungs are hyperinflated. Prominent right hilar region consistent with mediastinal and chest wall masses. There are no new consolidations or pleural effusions. No pulmonary edema. IMPRESSION: 1. Right power port appears unremarkable.  No pneumothorax. 2. Stable appearance of the chest.  No new consolidations. Electronically Signed   By: Nolon Nations M.D.   On: 04/27/2016 20:51    Microbiology: Recent Results (from the past 240 hour(s))  Blood Culture (routine x 2)     Status: None (Preliminary result)   Collection Time: 04/27/16  8:20 PM  Result Value Ref Range Status   Specimen Description BLOOD RIGHT ANTECUBITAL  Final   Special Requests BOTTLES DRAWN  AEROBIC AND ANAEROBIC 6CC  Final   Culture NO GROWTH 2 DAYS  Final   Report Status PENDING  Incomplete  Blood Culture (routine x 2)     Status: None (Preliminary result)   Collection Time: 04/27/16  8:33 PM  Result Value Ref Range Status   Specimen Description BLOOD LEFT ANTECUBITAL  Final   Special Requests BOTTLES DRAWN AEROBIC AND ANAEROBIC [place]  Final   Culture NO GROWTH 2 DAYS  Final   Report Status PENDING  Incomplete  Urine culture     Status: Abnormal   Collection Time: 04/27/16 11:44 PM    Result Value Ref Range Status   Specimen Description URINE, CLEAN CATCH  Final   Special Requests NONE  Final   Culture MULTIPLE SPECIES PRESENT, SUGGEST RECOLLECTION (A)  Final   Report Status 04/29/2016 FINAL  Final     Labs: Basic Metabolic Panel:  Recent Labs Lab 04/27/16 2020  NA 129*  K 4.2  CL 94*  CO2 30  GLUCOSE 127*  BUN 11  CREATININE 0.64  CALCIUM 7.9*   Liver Function Tests:  Recent Labs Lab 04/27/16 2020  AST 23  ALT 16*  ALKPHOS 86  BILITOT 0.5  PROT 6.8  ALBUMIN 2.7*   No results for input(s): LIPASE, AMYLASE in the last 168 hours. No results for input(s): AMMONIA in the last 168 hours. CBC:  Recent Labs Lab 04/27/16 2020  WBC 2.7*  NEUTROABS 1.9  HGB 12.4*  HCT 37.6*  MCV 88.1  PLT 154   Cardiac Enzymes:  Recent Labs Lab 04/28/16 2106 04/29/16 0321 04/29/16 0900  TROPONINI <0.03 <0.03 <0.03   BNP: BNP (last 3 results)  Recent Labs  04/27/16 2020  BNP 67.0    ProBNP (last 3 results) No results for input(s): PROBNP in the last 8760 hours.  CBG: No results for input(s): GLUCAP in the last 168 hours.     SignedLelon Frohlich  [facility] Pager: [phone] 04/29/2016, 6:22 PM

## 2016-04-29 NOTE — Progress Notes (Signed)
Spoke to [employer], Education officer, museum - she states [facility] should deliver O2 to hospital and to home and then patient may be discharged home. Updated patient. Await arrival of [facility] oxygen.

## 2016-05-03 ENCOUNTER — Telehealth (HOSPITAL_COMMUNITY): Payer: Self-pay | Admitting: Emergency Medicine

## 2016-05-03 LAB — CULTURE, BLOOD (ROUTINE X 2)
CULTURE: NO GROWTH
CULTURE: NO GROWTH

## 2016-05-03 NOTE — Telephone Encounter (Signed)
Verified pt is going to do treatment at [facility], no follow up appt with [place] at this time.

## 2016-05-06 ENCOUNTER — Encounter (HOSPITAL_COMMUNITY): Payer: Self-pay | Admitting: Hematology & Oncology

## 2016-05-09 ENCOUNTER — Encounter (HOSPITAL_COMMUNITY): Payer: Self-pay

## 2016-05-27 ENCOUNTER — Emergency Department (HOSPITAL_COMMUNITY): Payer: 59

## 2016-05-27 ENCOUNTER — Encounter (HOSPITAL_COMMUNITY): Payer: Self-pay | Admitting: Emergency Medicine

## 2016-05-27 ENCOUNTER — Inpatient Hospital Stay (HOSPITAL_COMMUNITY)
Admission: EM | Admit: 2016-05-27 | Discharge: 2016-05-30 | DRG: 603 | Disposition: A | Discharge status: Home / Self Care | Payer: 59 | Attending: Family Medicine | Admitting: Family Medicine

## 2016-05-27 DIAGNOSIS — K219 Gastro-esophageal reflux disease without esophagitis: Secondary | ICD-10-CM | POA: Diagnosis present

## 2016-05-27 DIAGNOSIS — Z923 Personal history of irradiation: Secondary | ICD-10-CM

## 2016-05-27 DIAGNOSIS — C3491 Malignant neoplasm of unspecified part of right bronchus or lung: Secondary | ICD-10-CM | POA: Diagnosis not present

## 2016-05-27 DIAGNOSIS — Z86010 Personal history of colonic polyps: Secondary | ICD-10-CM

## 2016-05-27 DIAGNOSIS — D61810 Antineoplastic chemotherapy induced pancytopenia: Secondary | ICD-10-CM | POA: Diagnosis present

## 2016-05-27 DIAGNOSIS — T451X5A Adverse effect of antineoplastic and immunosuppressive drugs, initial encounter: Secondary | ICD-10-CM | POA: Diagnosis present

## 2016-05-27 DIAGNOSIS — F418 Other specified anxiety disorders: Secondary | ICD-10-CM | POA: Diagnosis present

## 2016-05-27 DIAGNOSIS — D649 Anemia, unspecified: Secondary | ICD-10-CM | POA: Diagnosis present

## 2016-05-27 DIAGNOSIS — K743 Primary biliary cirrhosis: Secondary | ICD-10-CM | POA: Diagnosis present

## 2016-05-27 DIAGNOSIS — J449 Chronic obstructive pulmonary disease, unspecified: Secondary | ICD-10-CM | POA: Diagnosis present

## 2016-05-27 DIAGNOSIS — L03032 Cellulitis of left toe: Secondary | ICD-10-CM

## 2016-05-27 DIAGNOSIS — L0390 Cellulitis, unspecified: Secondary | ICD-10-CM | POA: Diagnosis present

## 2016-05-27 DIAGNOSIS — C7951 Secondary malignant neoplasm of bone: Secondary | ICD-10-CM | POA: Diagnosis present

## 2016-05-27 DIAGNOSIS — D701 Agranulocytosis secondary to cancer chemotherapy: Secondary | ICD-10-CM | POA: Diagnosis present

## 2016-05-27 DIAGNOSIS — L03116 Cellulitis of left lower limb: Principal | ICD-10-CM

## 2016-05-27 DIAGNOSIS — I10 Essential (primary) hypertension: Secondary | ICD-10-CM | POA: Diagnosis present

## 2016-05-27 DIAGNOSIS — M7989 Other specified soft tissue disorders: Secondary | ICD-10-CM | POA: Diagnosis not present

## 2016-05-27 DIAGNOSIS — Z801 Family history of malignant neoplasm of trachea, bronchus and lung: Secondary | ICD-10-CM

## 2016-05-27 DIAGNOSIS — Z9221 Personal history of antineoplastic chemotherapy: Secondary | ICD-10-CM

## 2016-05-27 LAB — BASIC METABOLIC PANEL
ANION GAP: 7 (ref 5–15)
BUN: 10 mg/dL (ref 6–20)
CO2: 33 mmol/L — AB (ref 22–32)
Calcium: 8.1 mg/dL — ABNORMAL LOW (ref 8.9–10.3)
Chloride: 96 mmol/L — ABNORMAL LOW (ref 101–111)
Creatinine, Ser: 0.73 mg/dL (ref 0.61–1.24)
GFR calc non Af Amer: >60 mL/min (ref >60)
GLUCOSE: 106 mg/dL — AB (ref 65–99)
POTASSIUM: 3.6 mmol/L (ref 3.5–5.1)
Sodium: 136 mmol/L (ref 135–145)

## 2016-05-27 LAB — CBC WITH DIFFERENTIAL/PLATELET
BASOS ABS: 0 10*3/uL (ref 0.0–0.1)
Basophils Relative: 0 %
EOS ABS: 0 10*3/uL (ref 0.0–0.7)
Eosinophils Relative: 0 %
HEMATOCRIT: 33 % — AB (ref 39.0–52.0)
Hemoglobin: 10.7 g/dL — ABNORMAL LOW (ref 13.0–17.0)
LYMPHS ABS: 0.8 10*3/uL (ref 0.7–4.0)
LYMPHS PCT: 29 %
MCH: 28.7 pg (ref 26.0–34.0)
MCHC: 32.4 g/dL (ref 30.0–36.0)
MCV: 88.5 fL (ref 78.0–100.0)
Monocytes Absolute: 0.9 10*3/uL (ref 0.1–1.0)
Monocytes Relative: 32 %
NEUTROS PCT: 39 %
Neutro Abs: 1.1 10*3/uL — ABNORMAL LOW (ref 1.7–7.7)
PLATELETS: 234 10*3/uL (ref 150–400)
RBC: 3.73 MIL/uL — AB (ref 4.22–5.81)
RDW: 16.2 % — AB (ref 11.5–15.5)
WBC: 2.7 10*3/uL — AB (ref 4.0–10.5)

## 2016-05-27 LAB — LACTIC ACID, PLASMA
LACTIC ACID, VENOUS: 1.1 mmol/L (ref 0.5–1.9)
Lactic Acid, Venous: 1 mmol/L (ref 0.5–1.9)

## 2016-05-27 LAB — C-REACTIVE PROTEIN: CRP: 5.9 mg/dL — AB (ref <1.0)

## 2016-05-27 MED ORDER — ADULT MULTIVITAMIN W/MINERALS CH
1.0000 | ORAL_TABLET | Freq: Every day | ORAL | Status: DC
  Administered 2016-05-27 – 2016-05-30 (×4): 1 via ORAL
  Filled 2016-05-27 (×4): qty 1

## 2016-05-27 MED ORDER — BOOST HIGH PROTEIN PO LIQD
1.0000 | Freq: Two times a day (BID) | ORAL | Status: DC
  Administered 2016-05-28 – 2016-05-30 (×5): 237 mL via ORAL
  Filled 2016-05-27 (×8): qty 237

## 2016-05-27 MED ORDER — SALINE SPRAY 0.65 % NA SOLN
1.0000 | NASAL | Status: DC | PRN
  Administered 2016-05-27: 1 via NASAL
  Filled 2016-05-27: qty 44

## 2016-05-27 MED ORDER — ALBUTEROL SULFATE (2.5 MG/3ML) 0.083% IN NEBU: 2.5000 mg | INHALATION_SOLUTION | RESPIRATORY_TRACT | Status: DC | PRN

## 2016-05-27 MED ORDER — PANTOPRAZOLE SODIUM 40 MG PO TBEC
40.0000 mg | DELAYED_RELEASE_TABLET | Freq: Every day | ORAL | Status: DC
  Administered 2016-05-28 – 2016-05-30 (×3): 40 mg via ORAL
  Filled 2016-05-27 (×3): qty 1

## 2016-05-27 MED ORDER — ORAL CARE MOUTH RINSE
15.0000 mL | Freq: Two times a day (BID) | OROMUCOSAL | Status: DC
  Administered 2016-05-27 – 2016-05-29 (×5): 15 mL via OROMUCOSAL

## 2016-05-27 MED ORDER — FUROSEMIDE 20 MG PO TABS
20.0000 mg | ORAL_TABLET | Freq: Every day | ORAL | Status: DC
  Administered 2016-05-28 – 2016-05-29 (×2): 20 mg via ORAL
  Filled 2016-05-27 (×2): qty 1

## 2016-05-27 MED ORDER — ONDANSETRON HCL 4 MG/2ML IJ SOLN: 4.0000 mg | Freq: Four times a day (QID) | INTRAMUSCULAR | Status: DC | PRN

## 2016-05-27 MED ORDER — MOMETASONE FURO-FORMOTEROL FUM 200-5 MCG/ACT IN AERO
INHALATION_SPRAY | RESPIRATORY_TRACT | Status: AC
  Filled 2016-05-27: qty 8.8

## 2016-05-27 MED ORDER — TIOTROPIUM BROMIDE MONOHYDRATE 18 MCG IN CAPS
18.0000 ug | ORAL_CAPSULE | Freq: Every day | RESPIRATORY_TRACT | Status: DC
  Administered 2016-05-28 – 2016-05-30 (×3): 18 ug via RESPIRATORY_TRACT
  Filled 2016-05-27: qty 5

## 2016-05-27 MED ORDER — FOLIC ACID 1 MG PO TABS
1.0000 mg | ORAL_TABLET | Freq: Every day | ORAL | Status: DC
  Administered 2016-05-28 – 2016-05-30 (×3): 1 mg via ORAL
  Filled 2016-05-27 (×3): qty 1

## 2016-05-27 MED ORDER — ONDANSETRON HCL 4 MG PO TABS: 4.0000 mg | ORAL_TABLET | Freq: Four times a day (QID) | ORAL | Status: DC | PRN

## 2016-05-27 MED ORDER — PIPERACILLIN-TAZOBACTAM 3.375 G IVPB
3.3750 g | Freq: Three times a day (TID) | INTRAVENOUS | Status: DC
  Administered 2016-05-27 – 2016-05-29 (×5): 3.375 g via INTRAVENOUS
  Filled 2016-05-27 (×5): qty 50

## 2016-05-27 MED ORDER — VANCOMYCIN HCL IN DEXTROSE 1-5 GM/200ML-% IV SOLN
1000.0000 mg | Freq: Once | INTRAVENOUS | Status: AC
  Administered 2016-05-27: 1000 mg via INTRAVENOUS
  Filled 2016-05-27: qty 200

## 2016-05-27 MED ORDER — SODIUM CHLORIDE 0.9% FLUSH
3.0000 mL | Freq: Two times a day (BID) | INTRAVENOUS | Status: DC
  Administered 2016-05-27 – 2016-05-30 (×5): 3 mL via INTRAVENOUS

## 2016-05-27 MED ORDER — PROCHLORPERAZINE MALEATE 5 MG PO TABS: 10.0000 mg | ORAL_TABLET | Freq: Four times a day (QID) | ORAL | Status: DC | PRN

## 2016-05-27 MED ORDER — NYSTATIN 100000 UNIT/ML MT SUSP
5.0000 mL | Freq: Four times a day (QID) | OROMUCOSAL | Status: DC
  Administered 2016-05-27 – 2016-05-30 (×11): 500000 [IU] via ORAL
  Filled 2016-05-27 (×11): qty 5

## 2016-05-27 MED ORDER — POLYETHYLENE GLYCOL 3350 17 G PO PACK
34.0000 g | PACK | Freq: Two times a day (BID) | ORAL | Status: DC
  Administered 2016-05-27 – 2016-05-30 (×6): 34 g via ORAL
  Filled 2016-05-27 (×7): qty 2

## 2016-05-27 MED ORDER — MOMETASONE FURO-FORMOTEROL FUM 200-5 MCG/ACT IN AERO
2.0000 | INHALATION_SPRAY | Freq: Two times a day (BID) | RESPIRATORY_TRACT | Status: DC
  Administered 2016-05-27 – 2016-05-30 (×6): 2 via RESPIRATORY_TRACT
  Filled 2016-05-27: qty 8.8

## 2016-05-27 MED ORDER — OBETICHOLIC ACID 5 MG PO TABS
5.0000 mg | ORAL_TABLET | Freq: Every day | ORAL | Status: DC
  Administered 2016-05-29: 5 mg via ORAL

## 2016-05-27 MED ORDER — FUROSEMIDE 40 MG PO TABS
40.0000 mg | ORAL_TABLET | Freq: Every day | ORAL | Status: DC
  Administered 2016-05-28 – 2016-05-30 (×3): 40 mg via ORAL
  Filled 2016-05-27 (×3): qty 1

## 2016-05-27 MED ORDER — IBUPROFEN 400 MG PO TABS
200.0000 mg | ORAL_TABLET | Freq: Four times a day (QID) | ORAL | Status: DC | PRN
  Administered 2016-05-27: 200 mg via ORAL
  Filled 2016-05-27: qty 1

## 2016-05-27 MED ORDER — SODIUM CHLORIDE 0.9% FLUSH: 3.0000 mL | INTRAVENOUS | Status: DC | PRN

## 2016-05-27 MED ORDER — VANCOMYCIN HCL IN DEXTROSE 750-5 MG/150ML-% IV SOLN
750.0000 mg | Freq: Two times a day (BID) | INTRAVENOUS | Status: DC
  Administered 2016-05-28 – 2016-05-29 (×3): 750 mg via INTRAVENOUS
  Filled 2016-05-27 (×5): qty 150

## 2016-05-27 MED ORDER — DRONABINOL 5 MG PO CAPS
5.0000 mg | ORAL_CAPSULE | Freq: Two times a day (BID) | ORAL | Status: DC
  Administered 2016-05-27 – 2016-05-30 (×6): 5 mg via ORAL
  Filled 2016-05-27 (×6): qty 1

## 2016-05-27 MED ORDER — FIRST-DUKES MOUTHWASH MT SUSP: 15.0000 mL | Freq: Every day | OROMUCOSAL | Status: DC

## 2016-05-27 MED ORDER — MORPHINE SULFATE ER 15 MG PO TBCR
15.0000 mg | EXTENDED_RELEASE_TABLET | Freq: Two times a day (BID) | ORAL | Status: DC
  Administered 2016-05-27 – 2016-05-30 (×6): 15 mg via ORAL
  Filled 2016-05-27 (×6): qty 1

## 2016-05-27 MED ORDER — SODIUM CHLORIDE 0.9 % IV SOLN: 250.0000 mL | INTRAVENOUS | Status: DC | PRN

## 2016-05-27 MED ORDER — MORPHINE SULFATE 15 MG PO TABS
15.0000 mg | ORAL_TABLET | ORAL | Status: DC | PRN
  Administered 2016-05-28: 15 mg via ORAL
  Filled 2016-05-27: qty 1

## 2016-05-27 MED ORDER — AYR SALINE NASAL NA GEL
1.0000 "application " | NASAL | Status: DC | PRN
  Filled 2016-05-27: qty 14.1

## 2016-05-27 MED ORDER — HEPARIN SODIUM (PORCINE) 5000 UNIT/ML IJ SOLN
5000.0000 [IU] | Freq: Three times a day (TID) | INTRAMUSCULAR | Status: DC
  Administered 2016-05-27 – 2016-05-30 (×8): 5000 [IU] via SUBCUTANEOUS
  Filled 2016-05-27 (×8): qty 1

## 2016-05-27 MED ORDER — PIPERACILLIN-TAZOBACTAM 3.375 G IVPB 30 MIN
3.3750 g | Freq: Once | INTRAVENOUS | Status: AC
  Administered 2016-05-27: 3.375 g via INTRAVENOUS
  Filled 2016-05-27: qty 50

## 2016-05-27 NOTE — H&P (Signed)
History and Physical    Jeffrey Gutierrez QQV:[id number] DOB: 1956/06/23 DOA: 05/27/2016  PCP: Purvis Kilts, MD   Patient coming from: Home   Chief Complaint: left foot swelling, redness, pain   HPI: Jeffrey Gutierrez is a 60 y.o. 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 Patient reports being in his usual state until noting swelling of the bilateral feet approximately one week ago. He was evaluated by his oncologist 4 days prior to this admission, was noted to have associated redness and tenderness at the second toe of the left foot and was prescribed Keflex for presumed cellulitis. Patient also takes Lasix twice daily. Over the ensuing 4 days, the swelling has decreased, but pain has persisted unchanged and discoloration has spread to involve the third and fourth digits of the left foot and the dorsal fore- and midfoot. Patient denies fevers or chills, but notes some general malaise over the past couple days. There is no chest pain, palpitations, or new cough. There is no calf swelling, erythema, or tenderness. Patient was scheduled to have chemotherapy 4 days prior to the admission, but this was held due to the apparent infection and leukopenia. Patient denies melena or hematochezia.  ED Course: Upon arrival to the ED, patient is found to be afebrile, saturating adequately on room air, and with vital signs stable. Radiographs of the left foot demonstrate midfoot degenerative changes, but no acute fracture or dislocation. Chemistry panel is notable for a hypochloremia and a serum bicarbonate of 33. CBC features a leukopenia with WBC of 2700, and a worsened normocytic anemia with hemoglobin of 10.7, down from 12.4 one month prior. Lactic acid is reassuring at 1.0. Blood cultures were  obtained and the patient was treated with empiric vancomycin in the emergency department. He remained hemodynamically stable and will be observed on the medical surgical unit for ongoing evaluation and management of left foot cellulitis, nonpurulent, which has failed outpatient therapy in an immunosuppressed patient.   Review of Systems:  All other systems reviewed and apart from HPI, are negative.  Past Medical History:  Diagnosis Date  . Bone cancer ([place])   . COPD (chronic obstructive pulmonary disease) ([place])   . Diverticulitis   . Essential hypertension   . GERD (gastroesophageal reflux disease)   . Hiatal hernia   . Hyperplastic rectal polyp   . Lung cancer ([place])   . PBC (primary biliary cirrhosis) ([place])    Diagnosed 2006  . S/P colonoscopy 2009   Pancolonic diverticula, hyperplastic rectal polyps    Past Surgical History:  Procedure Laterality Date  . Arm Surgery Left    ulnar nerve release  . CHOLECYSTECTOMY    . COLONOSCOPY  2009   Pancolonic diverticula, hyperplastic rectal polyps  . COLONOSCOPY [name] 03/24/2015   RMR: Single colonic polyp removed as described above. Pancolonic diverticulosis. Abnormal sigmoid colon consistent with stigmata of recent diverticulitis.   Marland Kitchen ESOPHAGOGASTRODUODENOSCOPY [name] 06/28/2015   RMR: mild erosive reflux/small HH  . INGUINAL HERNIA REPAIR Right Feb 2012   Dr. Arnoldo Morale   . INGUINAL HERNIA REPAIR Left 11/12/2015   Procedure: HERNIA REPAIR INGUINAL ADULT WITH MESH;  Surgeon: Aviva Signs, MD;  Location: [facility];  Service: General;  Laterality: Left;  . INSERTION OF MESH Left 11/12/2015   Procedure: INSERTION OF MESH;  Surgeon: Elta Guadeloupe  Arnoldo Morale, MD;  Location: [facility];  Service: General;  Laterality: Left;  . LIVER BIOPSY    . NASAL SEPTUM SURGERY       reports that he has been smoking Cigarettes.  He has a 21.50 pack-year smoking history. He has never used smokeless tobacco. He reports that he drinks alcohol. He reports that he does not use  drugs.  Allergies  Allergen Reactions  . Dihydrocodeine Anaphylaxis  . Hydrocodone Anaphylaxis and Other (See Comments)    Throat closes up  Patient states PCP gave 11/2015 and he has been tolerating it.  . Doxycycline Nausea And Vomiting  . Linzess [Linaclotide] Diarrhea    Family History  Problem Relation Age of Onset  . Lung cancer Mother   . Pneumonia Father   . Lung cancer Father   . Pancreatitis Father      Prior to Admission medications   Medication Sig Start Date End Date Taking? Authorizing Provider  albuterol (PROVENTIL HFA;VENTOLIN HFA) 108 (90 Base) MCG/ACT inhaler Inhale 2 puffs into the lungs every 4 (four) hours as needed for wheezing or shortness of breath. 09/17/15  Yes Merrily Pew, MD  budesonide-formoterol [facility]) 160-4.5 MCG/ACT inhaler Inhale 2 puffs into the lungs 2 (two) times daily.   Yes Historical Provider, MD  cephALEXin (KEFLEX) 500 MG capsule Take 500 mg by mouth 4 (four) times daily. 05/23/16 06/02/16 Yes Historical Provider, MD  Diphenhyd-Hydrocort-Nystatin (FIRST-DUKES MOUTHWASH) SUSP Take 15 mLs by mouth 5 (five) times daily. 05/08/16  Yes Historical Provider, MD  dronabinol (MARINOL) 5 MG capsule Take 1 capsule by mouth 2 (two) times daily. 04/26/16  Yes Historical Provider, MD  feeding supplement (BOOST HIGH PROTEIN) LIQD Take 1 Container by mouth 2 (two) times daily between meals.   Yes Historical Provider, MD  folic acid (FOLVITE) 1 MG tablet Take 1 mg by mouth daily.   Yes Historical Provider, MD  furosemide (LASIX) 20 MG tablet Take 20-40 tablets by mouth 2 (two) times daily. 40 mg in the morning and 20 mg at bedtime. 05/12/16  Yes Historical Provider, MD  ibuprofen (ADVIL,MOTRIN) 200 MG tablet Take 200 mg by mouth every 6 (six) hours as needed for mild pain or moderate pain. Reported on 12/14/2015   Yes Historical Provider, MD  morphine (MS CONTIN) 15 MG 12 hr tablet Take 1 tablet (15 mg total) by mouth every 12 (twelve) hours. 04/13/16  Yes Patrici Ranks, MD  morphine (MSIR) 15 MG tablet Take 1 tablet (15 mg total) by mouth every 4 (four) hours as needed for severe pain. 04/13/16  Yes Patrici Ranks, MD  Multiple Vitamin (MULTIVITAMIN WITH MINERALS) TABS tablet Take 1 tablet by mouth daily.   Yes Historical Provider, MD  nystatin (MYCOSTATIN) 100000 UNIT/ML suspension Take 5 mLs by mouth 4 (four) times daily. 05/23/16  Yes Historical Provider, MD  OCALIVA 5 MG TABS Take 1 tablet by mouth daily. Patient taking differently: Take 5 mg by mouth daily.  03/02/16  Yes Annitta Needs, NP  omeprazole (PRILOSEC) 20 MG capsule Take 1 capsule (20 mg total) by mouth daily. 01/24/16  Yes Mahala Menghini, PA-C  ondansetron (ZOFRAN) 8 MG tablet Take 1 tablet (8 mg total) by mouth every 8 (eight) hours as needed for nausea or vomiting. 04/03/16  Yes Patrici Ranks, MD  polyethylene glycol (MIRALAX / GLYCOLAX) packet Take 34 g by mouth 2 (two) times daily.    Yes Historical Provider, MD  prochlorperazine (COMPAZINE) 10 MG tablet Take 10 mg  by mouth every 6 (six) hours as needed for nausea/vomiting. 05/02/16 05/02/17 Yes Historical Provider, MD  tiotropium (SPIRIVA) 18 MCG inhalation capsule Place 18 mcg into inhaler and inhale daily.   Yes Historical Provider, MD  DULoxetine (CYMBALTA) 30 MG capsule Take 30 mg by mouth 2 (two) times daily.  02/23/16   Historical Provider, MD  levofloxacin (LEVAQUIN) 500 MG tablet Take 1 tablet (500 mg total) by mouth daily. Patient not taking: Reported on 05/27/2016 04/29/16   Erline Hau, MD  predniSONE (DELTASONE) 10 MG tablet Take 1 tablet (10 mg total) by mouth daily with breakfast. Take 6 tablets today and then decrease by 1 tablet daily until none are left. Patient not taking: Reported on 05/27/2016 04/29/16   Erline Hau, MD  sucralfate (CARAFATE) 1 g tablet Take 1 tablet by mouth 4 (four) times daily. 05/26/16   Historical Provider, MD  Wound Dressings (SONAFINE EX) Apply topically.    Historical  Provider, MD    Physical Exam: Vitals:   05/27/16 1400 05/27/16 1430 05/27/16 1443 05/27/16 1553  BP: 118/73 112/76 110/78 111/74  Pulse:   78 101  Resp:   16 17  Temp:      TempSrc:      SpO2:   100% 93%  Weight:      Height:          Constitutional: NAD, calm, comfortable, cachectic Eyes: PERTLA, lids and conjunctivae normal ENMT: Mucous membranes are moist. Posterior pharynx clear of any exudate or lesions.   Neck: normal, supple, no masses, no thyromegaly Respiratory: Bilateral rhonchi at mid-lung zones, no wheezes. Normal respiratory effort. No accessory muscle use.  Cardiovascular: S1 & S2 heard, regular rate and rhythm. No carotid bruits. No significant JVD. Abdomen: No distension, no tenderness, no masses palpated. Bowel sounds normal.  Musculoskeletal: no clubbing / cyanosis. trace pedal edema on right, 1+ on left. Normal muscle tone.  Skin: Erythema, edema, and tenderness involving dorsum of left foot and 2-4th digits without abscess or drainage. Skin is otherwise warm, dry, well-perfused. Neurologic: CN 2-12 grossly intact. Sensation intact, DTR normal. Strength 5/5 in all 4 limbs.  Psychiatric: Normal judgment and insight. Alert and oriented x 3. Normal mood and affect.     Labs on Admission: I have personally reviewed following labs and imaging studies  CBC:  Recent Labs Lab 05/27/16 1321  WBC 2.7*  NEUTROABS 1.1*  HGB 10.7*  HCT 33.0*  MCV 88.5  PLT 710   Basic Metabolic Panel:  Recent Labs Lab 05/27/16 1321  NA 136  K 3.6  CL 96*  CO2 33*  GLUCOSE 106*  BUN 10  CREATININE 0.73  CALCIUM 8.1*   GFR: Estimated Creatinine Clearance: 89.1 mL/min (by C-G formula based on SCr of 0.8 mg/dL). Liver Function Tests: No results for input(s): AST, ALT, ALKPHOS, BILITOT, PROT, ALBUMIN in the last 168 hours. No results for input(s): LIPASE, AMYLASE in the last 168 hours. No results for input(s): AMMONIA in the last 168 hours. Coagulation Profile: No  results for input(s): INR, PROTIME in the last 168 hours. Cardiac Enzymes: No results for input(s): CKTOTAL, CKMB, CKMBINDEX, TROPONINI in the last 168 hours. BNP (last 3 results) No results for input(s): PROBNP in the last 8760 hours. HbA1C: No results for input(s): HGBA1C in the last 72 hours. CBG: No results for input(s): GLUCAP in the last 168 hours. Lipid Profile: No results for input(s): CHOL, HDL, LDLCALC, TRIG, CHOLHDL, LDLDIRECT in the last 72 hours. Thyroid  Function Tests: No results for input(s): TSH, T4TOTAL, FREET4, T3FREE, THYROIDAB in the last 72 hours. Anemia Panel: No results for input(s): VITAMINB12, FOLATE, FERRITIN, TIBC, IRON, RETICCTPCT in the last 72 hours. Urine analysis:    Component Value Date/Time   COLORURINE AMBER (A) 04/27/2016 2344   APPEARANCEUR HAZY (A) 04/27/2016 2344   LABSPEC 1.010 04/27/2016 2344   PHURINE 7.0 04/27/2016 2344   GLUCOSEU NEGATIVE 04/27/2016 2344   HGBUR NEGATIVE 04/27/2016 2344   BILIRUBINUR NEGATIVE 04/27/2016 2344   KETONESUR NEGATIVE 04/27/2016 2344   PROTEINUR NEGATIVE 04/27/2016 2344   NITRITE NEGATIVE 04/27/2016 2344   LEUKOCYTESUR NEGATIVE 04/27/2016 2344   Sepsis Labs: '@LABRCNTIP'$ (procalcitonin:4,lacticidven:4) ) Recent Results (from the past 240 hour(s))  Culture, blood (routine x 2)     Status: None (Preliminary result)   Collection Time: 05/27/16  1:21 PM  Result Value Ref Range Status   Specimen Description BLOOD LEFT ANTECUBITAL  Final   Special Requests BOTTLES DRAWN AEROBIC AND ANAEROBIC 6CC  Final   Culture PENDING  Incomplete   Report Status PENDING  Incomplete  Culture, blood (routine x 2)     Status: None (Preliminary result)   Collection Time: 05/27/16  1:37 PM  Result Value Ref Range Status   Specimen Description BLOOD RIGHT ANTECUBITAL  Final   Special Requests BOTTLES DRAWN AEROBIC AND ANAEROBIC 6CC  Final   Culture PENDING  Incomplete   Report Status PENDING  Incomplete     Radiological Exams  on Admission: Dg Foot Complete Left  Result Date: 05/27/2016 CLINICAL DATA:  Patient with left foot redness and swelling for 1 week. EXAM: LEFT FOOT - COMPLETE 3+ VIEW COMPARISON:  None. FINDINGS: Midfoot degenerative changes. First MTP joint degenerative changes. Normal anatomic alignment. No evidence for acute fracture or dislocation posterior calcaneal spurring. Regional soft tissues are unremarkable. IMPRESSION: No acute osseous abnormality. Electronically Signed   By: Lovey Newcomer M.D.   On: 05/27/2016 13:15    EKG: Not performed, will obtain as appropriate  Assessment/Plan  1. Cellulitis, LLE  - There is no fever, but pt is immunosuppressed  - Left foot erythema, tenderness worsening despite treatment with Keflex since 9/5 or 9/6  - Radiographs negative for underlying bony involvement; clinically, no evidence for DVT  - Blood cultures are incubating  - Empiric vancomycin was started in ED, will add Zosyn for severe non-purulent disease in immunocompromised pt  - RN asked to outline area of erythema with skin pen on admission  - Follow inflammatory markers and clinical progress    2. Right lung cancer, stage IV  - Pt follows with heme/onc at [facility] and is managed with chemotherapy - Last chemo session, 4 days prior to admission, was held due to cellulitis and leukopenia  - Has known spinal mets and undergoing radiation therapy with Dr. Tammi Klippel  - Ongoing management through heme/onc, rad onc    3. COPD - Stable, no dyspnea or wheezes on admission  - Continue scheduled inhalers, prn albuterol    4. Leukopenia  - WBC 2,700 on admission and stable relative to CBC from 1 month ago  - Likely secondary to chemotherapy; acute infection possibly contributing  - Treating the infection with broad-spectrum abx as above   5. Normocytic anemia  - Hgb 10.7 on admission with normal MCV  - Hgb was 12.4 one month prior, and normal prior to that  - No active blood-loss evident on admission  -  Check FOBT  - Continue folate supplementation    6.  GERD - Stable, managed with Prilosec at home - Continue PPI    8. Primary biliary cirrhosis - Stable, managed with obeticholic acid, will continue    DVT prophylaxis: sq heparin Code Status: Full  Family Communication: Wife updated at bedside Disposition Plan: Observe on med-surg Consults called: None Admission status: Observation    Vianne Bulls, MD [facility] Pager [phone]  If 7PM-7AM, please contact night-coverage [email or web address] Password [email or web address]  05/27/2016, 4:21 PM

## 2016-05-27 NOTE — Progress Notes (Signed)
Pharmacy Antibiotic Note  Jeffrey Gutierrez is a 60 y.o. male admitted on 05/27/2016 with cellulitis.  Pharmacy has been consulted for Vancomycin and Zosyn dosing.  Plan:  Vancomycin '1000mg'$  x 1 then '750mg'$  IV q12h Check trough at steady state Zosyn 3.375gm IV q8h, EID Monitor labs, renal fxn, progress and c/s Deescalate ABX when improved / appropriate.    Height: '6\' 1"'$  (185.4 cm) Weight: 138 lb (62.6 kg) IBW/kg (Calculated) : 79.9  Temp (24hrs), Avg:98.1 F (36.7 C), Min:97.5 F (36.4 C), Max:98.6 F (37 C)   Recent Labs Lab 05/27/16 1321  WBC 2.7*  CREATININE 0.73  LATICACIDVEN 1.0    Estimated Creatinine Clearance: 89.1 mL/min (by C-G formula based on SCr of 0.8 mg/dL).    Allergies  Allergen Reactions  . Dihydrocodeine Anaphylaxis  . Hydrocodone Anaphylaxis and Other (See Comments)    Throat closes up  Patient states PCP gave 11/2015 and he has been tolerating it.  . Doxycycline Nausea And Vomiting  . Linzess [Linaclotide] Diarrhea    Antimicrobials this admission: Vancomycin and Zosyn 9/9 >>  Dose adjustments this admission:  Recent Results (from the past 240 hour(s))  Culture, blood (routine x 2)     Status: None (Preliminary result)   Collection Time: 05/27/16  1:21 PM  Result Value Ref Range Status   Specimen Description BLOOD LEFT ANTECUBITAL  Final   Special Requests BOTTLES DRAWN AEROBIC AND ANAEROBIC 6CC  Final   Culture PENDING  Incomplete   Report Status PENDING  Incomplete  Culture, blood (routine x 2)     Status: None (Preliminary result)   Collection Time: 05/27/16  1:37 PM  Result Value Ref Range Status   Specimen Description BLOOD RIGHT ANTECUBITAL  Final   Special Requests BOTTLES DRAWN AEROBIC AND ANAEROBIC 6CC  Final   Culture PENDING  Incomplete   Report Status PENDING  Incomplete   Thank you for allowing pharmacy to be a part of this patient's care.  Hart Robinsons A 05/27/2016 4:57 PM

## 2016-05-27 NOTE — ED Triage Notes (Signed)
Reports pain and swelling in feet bilaterally for the past few days.  Is undergoing chemo for lung ca.

## 2016-05-27 NOTE — ED Provider Notes (Signed)
[facility] DEPT Provider Note   CSN: [id number] Arrival date & time: 05/27/16  1157     History   Chief Complaint Chief Complaint  Patient presents with  . Foot Swelling  . Rash    HPI Jeffrey Gutierrez is a 60 y.o. male.  HPI  Pt was seen at 1245.  Per pt and his family, c/o gradual onset and worsening of persistent left toes and foot "swelling" and "redness" for the past 4 days. Pt states he was evaluated by his Heme/Onc MD at [facility] 4 days ago for same, but at that time the redness and swelling was only located on his left 2nd toe. Pt states now "more toes and my foot" are "red and swollen." Pt states he was rx keflex 4 days ago and has been taking it as prescribed. Pt did not receive his usual dose of chemo 4 days ago "because my WBC count was too low." Denies fevers, no other areas of rash, no CP/SOB, no abd pain, no injury, no focal motor weakness, no tingling/numbness in extremities, no calf pain or swelling.   Past Medical History:  Diagnosis Date  . Bone cancer ([place])   . COPD (chronic obstructive pulmonary disease) ([place])   . Diverticulitis   . Essential hypertension   . GERD (gastroesophageal reflux disease)   . Hiatal hernia   . Hyperplastic rectal polyp   . Lung cancer ([place])   . 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 S/P colonoscopy 2009   Pancolonic diverticula, hyperplastic rectal polyps    Patient Active Problem List   Diagnosis Date Noted  . COPD exacerbation ([place]) 04/28/2016  . Protein-calorie malnutrition, severe 04/28/2016  . Acute respiratory failure with hypoxia ([place]) 04/27/2016  . Spine metastasis ([place]) 04/01/2016  . Metastasis to bone ([place]) 04/01/2016  . Adenocarcinoma of right lung, stage 4 ([place]) 04/01/2016  . Loss of weight 12/22/2015  . PBC (primary biliary cirrhosis) ([place]) 12/22/2015  . RUQ pain 09/27/2015  . Hepatic cirrhosis ([place]) 07/26/2015  . Hiatal hernia   . Reflux esophagitis   . Diverticulosis of colon without  hemorrhage   . History of colonic polyps   . Change in bowel function 03/10/2015  . Constipation 02/04/2015  . 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 OTHER CHRONIC NONALCOHOLIC LIVER DISEASE 22/10/5425  . Abdominal pain 01/20/2009  . [place] NONSPECIFIC IMMUNOLOGICAL FINDINGS 01/20/2009  . CHOLECYSTECTOMY, HX OF 01/20/2009    Past Surgical History:  Procedure Laterality Date  . Arm Surgery Left    ulnar nerve release  . CHOLECYSTECTOMY    . COLONOSCOPY  2009   Pancolonic diverticula, hyperplastic rectal polyps  . COLONOSCOPY [name] 03/24/2015   RMR: Single colonic polyp removed as described above. Pancolonic diverticulosis. Abnormal sigmoid colon consistent with stigmata of recent diverticulitis.   Marland Kitchen ESOPHAGOGASTRODUODENOSCOPY [name] 06/28/2015   RMR: mild erosive reflux/small HH  . INGUINAL HERNIA REPAIR Right Feb 2012   Dr. Arnoldo Morale   . INGUINAL HERNIA REPAIR Left 11/12/2015   Procedure: HERNIA REPAIR INGUINAL ADULT WITH MESH;  Surgeon: Aviva Signs, MD;  Location: [facility];  Service: General;  Laterality: Left;  . INSERTION OF MESH Left 11/12/2015   Procedure: INSERTION OF MESH;  Surgeon: Aviva Signs, MD;  Location: [facility];  Service: General;  Laterality: Left;  . LIVER BIOPSY    . NASAL SEPTUM SURGERY         Home Medications    Prior to Admission medications   Medication Sig  Start Date End Date Taking? Authorizing Provider  albuterol (PROVENTIL HFA;VENTOLIN HFA) 108 (90 Base) MCG/ACT inhaler Inhale 2 puffs into the lungs every 4 (four) hours as needed for wheezing or shortness of breath. 09/17/15  Yes Merrily Pew, MD  budesonide-formoterol [facility]) 160-4.5 MCG/ACT inhaler Inhale 2 puffs into the lungs 2 (two) times daily.   Yes Historical Provider, MD  cephALEXin (KEFLEX) 500 MG capsule Take 500 mg by mouth 4 (four) times daily. 05/23/16 06/02/16 Yes Historical Provider, MD  Diphenhyd-Hydrocort-Nystatin (FIRST-DUKES MOUTHWASH) SUSP Take 15 mLs by mouth 5 (five) times  daily. 05/08/16  Yes Historical Provider, MD  dronabinol (MARINOL) 5 MG capsule Take 1 capsule by mouth 2 (two) times daily. 04/26/16  Yes Historical Provider, MD  feeding supplement (BOOST HIGH PROTEIN) LIQD Take 1 Container by mouth 2 (two) times daily between meals.   Yes Historical Provider, MD  folic acid (FOLVITE) 1 MG tablet Take 1 mg by mouth daily.   Yes Historical Provider, MD  furosemide (LASIX) 20 MG tablet Take 20-40 tablets by mouth 2 (two) times daily. 40 mg in the morning and 20 mg at bedtime. 05/12/16  Yes Historical Provider, MD  ibuprofen (ADVIL,MOTRIN) 200 MG tablet Take 200 mg by mouth every 6 (six) hours as needed for mild pain or moderate pain. Reported on 12/14/2015   Yes Historical Provider, MD  morphine (MS CONTIN) 15 MG 12 hr tablet Take 1 tablet (15 mg total) by mouth every 12 (twelve) hours. 04/13/16  Yes Patrici Ranks, MD  morphine (MSIR) 15 MG tablet Take 1 tablet (15 mg total) by mouth every 4 (four) hours as needed for severe pain. 04/13/16  Yes Patrici Ranks, MD  Multiple Vitamin (MULTIVITAMIN WITH MINERALS) TABS tablet Take 1 tablet by mouth daily.   Yes Historical Provider, MD  nystatin (MYCOSTATIN) 100000 UNIT/ML suspension Take 5 mLs by mouth 4 (four) times daily. 05/23/16  Yes Historical Provider, MD  OCALIVA 5 MG TABS Take 1 tablet by mouth daily. Patient taking differently: Take 5 mg by mouth daily.  03/02/16  Yes Annitta Needs, NP  omeprazole (PRILOSEC) 20 MG capsule Take 1 capsule (20 mg total) by mouth daily. 01/24/16  Yes Mahala Menghini, PA-C  ondansetron (ZOFRAN) 8 MG tablet Take 1 tablet (8 mg total) by mouth every 8 (eight) hours as needed for nausea or vomiting. 04/03/16  Yes Patrici Ranks, MD  polyethylene glycol (MIRALAX / GLYCOLAX) packet Take 34 g by mouth 2 (two) times daily.    Yes Historical Provider, MD  prochlorperazine (COMPAZINE) 10 MG tablet Take 10 mg by mouth every 6 (six) hours as needed for nausea/vomiting. 05/02/16 05/02/17 Yes Historical  Provider, MD  tiotropium (SPIRIVA) 18 MCG inhalation capsule Place 18 mcg into inhaler and inhale daily.   Yes Historical Provider, MD  DULoxetine (CYMBALTA) 30 MG capsule Take 30 mg by mouth 2 (two) times daily.  02/23/16   Historical Provider, MD  levofloxacin (LEVAQUIN) 500 MG tablet Take 1 tablet (500 mg total) by mouth daily. Patient not taking: Reported on 05/27/2016 04/29/16   Erline Hau, MD  predniSONE (DELTASONE) 10 MG tablet Take 1 tablet (10 mg total) by mouth daily with breakfast. Take 6 tablets today and then decrease by 1 tablet daily until none are left. Patient not taking: Reported on 05/27/2016 04/29/16   Erline Hau, MD  sucralfate (CARAFATE) 1 g tablet Take 1 tablet by mouth 4 (four) times daily. 05/26/16   Historical Provider, MD  Wound Dressings (SONAFINE EX) Apply topically.    Historical Provider, MD    Family History Family History  Problem Relation Age of Onset  . Lung cancer Mother   . Pneumonia Father   . Lung cancer Father   . Pancreatitis Father     Social History Social History  Substance Use Topics  . Smoking status: Current Some Day Smoker    Packs/day: 0.50    Years: 43.00    Types: Cigarettes  . Smokeless tobacco: Never Used  . Alcohol use 0.0 oz/week     Comment: occassional     Allergies   Dihydrocodeine; Hydrocodone; Doxycycline; and Linzess [linaclotide]   Review of Systems Review of Systems ROS: Statement: All systems negative except as marked or noted in the HPI; Constitutional: Negative for fever and chills. ; ; Eyes: Negative for eye pain, redness and discharge. ; ; ENMT: Negative for ear pain, hoarseness, nasal congestion, sinus pressure and sore throat. ; ; Cardiovascular: Negative for chest pain, palpitations, diaphoresis, dyspnea. ; ; Respiratory: Negative for cough, wheezing and stridor. ; ; Gastrointestinal: Negative for nausea, vomiting, diarrhea, abdominal pain, blood in stool, hematemesis, jaundice and rectal  bleeding. . ; ; Genitourinary: Negative for dysuria, flank pain and hematuria. ; ; Musculoskeletal: +left foot swelling, rash.  Negative for back pain and neck pain. Negative for trauma.; ; Skin: Negative for pruritus, abrasions, blisters, bruising and skin lesion.; ; Neuro: Negative for headache, lightheadedness and neck stiffness. Negative for weakness, altered level of consciousness, altered mental status, extremity weakness, paresthesias, involuntary movement, seizure and syncope.       Physical Exam Updated Vital Signs BP 93/64 (BP Location: Left Arm)   Temp 97.5 F (36.4 C) (Oral)   Resp 20   Ht '6\' 1"'$  (1.854 m)   Wt 138 lb (62.6 kg)   SpO2 (!) 88%   BMI 18.21 kg/m   Physical Exam 1250: Physical examination:  Nursing notes reviewed; Vital signs and O2 SAT reviewed;  Constitutional: Frail, thin. Well hydrated, In no acute distress; Head:  Normocephalic, atraumatic; Eyes: EOMI, PERRL, No scleral icterus; ENMT: Mouth and pharynx normal, Mucous membranes moist; Neck: Supple, Full range of motion, No lymphadenopathy; Cardiovascular: Regular rate and rhythm, No gallop; Respiratory: Breath sounds clear & equal bilaterally, No wheezes.  Speaking full sentences with ease, Normal respiratory effort/excursion; Chest: Nontender, Movement normal; Abdomen: Soft, Nontender, Nondistended, Normal bowel sounds; Genitourinary: No CVA tenderness; Extremities: Pulses normal, +edema and tenderness starting left 2, 3, 4th toes and extending up dorsal foot and streaking up anterior lower leg. +open superficial blisters noted between toes, no drainage. No calf tenderness, edema or asymmetry.; Neuro: AA&Ox3, Major CN grossly intact.  Speech clear. No gross focal motor or sensory deficits in extremities.; Skin: Color normal, Warm, Dry.    ED Treatments / Results  Labs (all labs ordered are listed, but only abnormal results are displayed)   EKG  EKG Interpretation None        Radiology   Procedures Procedures (including critical care time)  Medications Ordered in ED Medications - No data to display   Initial Impression / Assessment and Plan / ED Course  I have reviewed the triage vital signs and the nursing notes.  Pertinent labs & imaging results that were available during my care of the patient were reviewed by me and considered in my medical decision making (see chart for details).  MDM Reviewed: previous chart, nursing note and vitals Reviewed previous: labs Interpretation: labs and x-ray  Results for orders placed or performed during the hospital encounter of 05/27/16  Culture, blood (routine x 2)  Result Value Ref Range   Specimen Description BLOOD LEFT ANTECUBITAL    Special Requests BOTTLES DRAWN AEROBIC AND ANAEROBIC 6CC    Culture PENDING    Report Status PENDING   Culture, blood (routine x 2)  Result Value Ref Range   Specimen Description BLOOD RIGHT ANTECUBITAL    Special Requests BOTTLES DRAWN AEROBIC AND ANAEROBIC 6CC    Culture PENDING    Report Status PENDING   Basic metabolic panel  Result Value Ref Range   Sodium 136 135 - 145 mmol/L   Potassium 3.6 3.5 - 5.1 mmol/L   Chloride 96 (L) 101 - 111 mmol/L   CO2 33 (H) 22 - 32 mmol/L   Glucose, Bld 106 (H) 65 - 99 mg/dL   BUN 10 6 - 20 mg/dL   Creatinine, Ser 0.73 0.61 - 1.24 mg/dL   Calcium 8.1 (L) 8.9 - 10.3 mg/dL   GFR calc non Af Amer >60 >60 mL/min   GFR calc Af Amer >60 >60 mL/min   Anion gap 7 5 - 15  Lactic acid, plasma  Result Value Ref Range   Lactic Acid, Venous 1.0 0.5 - 1.9 mmol/L  CBC with Differential  Result Value Ref Range   WBC 2.7 (L) 4.0 - 10.5 K/uL   RBC 3.73 (L) 4.22 - 5.81 MIL/uL   Hemoglobin 10.7 (L) 13.0 - 17.0 g/dL   HCT 33.0 (L) 39.0 - 52.0 %   MCV 88.5 78.0 - 100.0 fL   MCH 28.7 26.0 - 34.0 pg   MCHC 32.4 30.0 - 36.0 g/dL   RDW 16.2 (H) 11.5 - 15.5 %   Platelets 234 150 - 400 K/uL   Neutrophils Relative % 39 %   Neutro Abs 1.1  (L) 1.7 - 7.7 K/uL   Lymphocytes Relative 29 %   Lymphs Abs 0.8 0.7 - 4.0 K/uL   Monocytes Relative 32 %   Monocytes Absolute 0.9 0.1 - 1.0 K/uL   Eosinophils Relative 0 %   Eosinophils Absolute 0.0 0.0 - 0.7 K/uL   Basophils Relative 0 %   Basophils Absolute 0.0 0.0 - 0.1 K/uL   WBC Morphology ATYPICAL LYMPHOCYTES     Dg Foot Complete Left Result Date: 05/27/2016 CLINICAL DATA:  Patient with left foot redness and swelling for 1 week. EXAM: LEFT FOOT - COMPLETE 3+ VIEW COMPARISON:  None. FINDINGS: Midfoot degenerative changes. First MTP joint degenerative changes. Normal anatomic alignment. No evidence for acute fracture or dislocation posterior calcaneal spurring. Regional soft tissues are unremarkable. IMPRESSION: No acute osseous abnormality. Electronically Signed   By: Lovey Newcomer M.D.   On: 05/27/2016 13:15     1525:  IV vancomycin started after BCx2 obtained. Dx and testing d/w pt and family.  Questions answered.  Verb understanding, agreeable to admit.  T/C to [facility] Dr. Myna Hidalgo, case discussed, including:  HPI, pertinent PM/SHx, VS/PE, dx testing, ED course and treatment:  Agreeable to admit, requests to write temporary orders, obtain medical bed to team [facility].    Final Clinical Impressions(s) / ED Diagnoses   Final diagnoses:  None    New Prescriptions New Prescriptions   No medications on file     Francine Graven, DO 05/30/16 1712

## 2016-05-28 DIAGNOSIS — D701 Agranulocytosis secondary to cancer chemotherapy: Secondary | ICD-10-CM | POA: Diagnosis present

## 2016-05-28 DIAGNOSIS — Z801 Family history of malignant neoplasm of trachea, bronchus and lung: Secondary | ICD-10-CM | POA: Diagnosis not present

## 2016-05-28 DIAGNOSIS — K219 Gastro-esophageal reflux disease without esophagitis: Secondary | ICD-10-CM

## 2016-05-28 DIAGNOSIS — J449 Chronic obstructive pulmonary disease, unspecified: Secondary | ICD-10-CM | POA: Diagnosis not present

## 2016-05-28 DIAGNOSIS — K743 Primary biliary cirrhosis: Secondary | ICD-10-CM

## 2016-05-28 DIAGNOSIS — Z86010 Personal history of colonic polyps: Secondary | ICD-10-CM | POA: Diagnosis not present

## 2016-05-28 DIAGNOSIS — F418 Other specified anxiety disorders: Secondary | ICD-10-CM | POA: Diagnosis present

## 2016-05-28 DIAGNOSIS — D649 Anemia, unspecified: Secondary | ICD-10-CM | POA: Diagnosis present

## 2016-05-28 DIAGNOSIS — C7951 Secondary malignant neoplasm of bone: Secondary | ICD-10-CM | POA: Diagnosis present

## 2016-05-28 DIAGNOSIS — Z9221 Personal history of antineoplastic chemotherapy: Secondary | ICD-10-CM | POA: Diagnosis not present

## 2016-05-28 DIAGNOSIS — I10 Essential (primary) hypertension: Secondary | ICD-10-CM | POA: Diagnosis present

## 2016-05-28 DIAGNOSIS — T451X5A Adverse effect of antineoplastic and immunosuppressive drugs, initial encounter: Secondary | ICD-10-CM | POA: Diagnosis present

## 2016-05-28 DIAGNOSIS — L03116 Cellulitis of left lower limb: Secondary | ICD-10-CM | POA: Diagnosis not present

## 2016-05-28 DIAGNOSIS — M7989 Other specified soft tissue disorders: Secondary | ICD-10-CM | POA: Diagnosis present

## 2016-05-28 DIAGNOSIS — C3491 Malignant neoplasm of unspecified part of right bronchus or lung: Secondary | ICD-10-CM | POA: Diagnosis not present

## 2016-05-28 DIAGNOSIS — Z923 Personal history of irradiation: Secondary | ICD-10-CM | POA: Diagnosis not present

## 2016-05-28 LAB — CBC WITH DIFFERENTIAL/PLATELET
BASOS PCT: 1 %
Basophils Absolute: 0 10*3/uL (ref 0.0–0.1)
EOS PCT: 0 %
Eosinophils Absolute: 0 10*3/uL (ref 0.0–0.7)
HEMATOCRIT: 30.7 % — AB (ref 39.0–52.0)
Hemoglobin: 10.1 g/dL — ABNORMAL LOW (ref 13.0–17.0)
LYMPHS PCT: 26 %
Lymphs Abs: 0.8 10*3/uL (ref 0.7–4.0)
MCH: 29.1 pg (ref 26.0–34.0)
MCHC: 32.9 g/dL (ref 30.0–36.0)
MCV: 88.5 fL (ref 78.0–100.0)
MONO ABS: 0.9 10*3/uL (ref 0.1–1.0)
MONOS PCT: 31 %
NEUTROS PCT: 42 %
Neutro Abs: 1.2 10*3/uL — ABNORMAL LOW (ref 1.7–7.7)
PLATELETS: 233 10*3/uL (ref 150–400)
RBC: 3.47 MIL/uL — AB (ref 4.22–5.81)
RDW: 16.4 % — AB (ref 11.5–15.5)
WBC: 2.9 10*3/uL — AB (ref 4.0–10.5)

## 2016-05-28 LAB — BASIC METABOLIC PANEL
ANION GAP: 4 — AB (ref 5–15)
BUN: 11 mg/dL (ref 6–20)
CALCIUM: 7.7 mg/dL — AB (ref 8.9–10.3)
CO2: 34 mmol/L — AB (ref 22–32)
Chloride: 95 mmol/L — ABNORMAL LOW (ref 101–111)
Creatinine, Ser: 0.86 mg/dL (ref 0.61–1.24)
Glucose, Bld: 111 mg/dL — ABNORMAL HIGH (ref 65–99)
POTASSIUM: 3.5 mmol/L (ref 3.5–5.1)
Sodium: 133 mmol/L — ABNORMAL LOW (ref 135–145)

## 2016-05-28 MED ORDER — VANCOMYCIN HCL IN DEXTROSE 750-5 MG/150ML-% IV SOLN
INTRAVENOUS | Status: AC
  Filled 2016-05-28: qty 150

## 2016-05-28 NOTE — Progress Notes (Signed)
PROGRESS NOTE    Jeffrey Gutierrez  EUM:[id number] DOB: 03-18-1956 DOA: 05/27/2016 PCP: Purvis Kilts, MD    Brief Narrative:  Jeffrey Gutierrez is a 60 y.o. 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 Patient reports being in his usual state until noting swelling of the bilateral feet approximately one week ago. He was evaluated by his oncologist 4 days prior to this admission, was noted to have associated redness and tenderness at the second toe of the left foot and was prescribed Keflex for presumed cellulitis. Patient also takes Lasix twice daily. Over the ensuing 4 days, the swelling has decreased, but pain has persisted unchanged and discoloration has spread to involve the third and fourth digits of the left foot and the dorsal fore- and midfoot. Patient denies fevers or chills, but notes some general malaise over the past couple days. There is no chest pain, palpitations, or new cough. There is no calf swelling, erythema, or tenderness. Patient was scheduled to have chemotherapy 4 days prior to the admission, but this was held due to the apparent infection and leukopenia. Patient denies melena or hematochezia.   Assessment & Plan:   Principal Problem:   Cellulitis Active Problems:   PBC (primary biliary cirrhosis) (H[facility])   Metastasis to bone (H[facility])   Adenocarcinoma of right lung, stage 4 ([facility])   COPD (chronic obstructive pulmonary disease) ([facility])   Normocytic anemia   Leukopenia   Depression with anxiety   GERD (gastroesophageal reflux disease)   Cellulitis, LLE  - patient afebrile but pt is immunosuppressed  - Left foot erythema, tenderness worsening at admission despite treatment with Keflex since 9/5 or 9/6  - Radiographs negative for underlying bony involvement;  clinically, no evidence for DVT  - Blood cultures NG <24 hours  - Empiric vancomycin was started in ED, will add Zosyn for severe non-purulent disease in immunocompromised pt  - CRP elevated at 5.9 - patient reports swelling has improved but redness has not improved much and pain is still significant   Right lung cancer, stage IV  - Pt follows with heme/onc at [facility] and is managed with chemotherapy - Last chemo session, 4 days prior to admission, was held due to cellulitis and leukopenia  - Has known spinal mets and undergoing radiation therapy with Dr. Tammi Klippel  - Ongoing management through heme/onc, rad onc    COPD - Stable, no dyspnea or wheezes on admission  - Continue scheduled inhalers, prn albuterol    Leukopenia  - WBC 2,700 on admission and stable relative to CBC from 1 month ago  - Likely secondary to chemotherapy; acute infection possibly contributing  - Treating the infection with broad-spectrum abx as above  - WBC this am of 2.9 with 42 neutrophils and occasional bands noted  Normocytic anemia  - Hgb 10.7 on admission with normal MCV  - Hgb was 12.4 one month prior, and normal prior to that  - No active blood-loss evident on admission  - Check FOBT  - Continue folate supplementation   - Hgb today of 10.1   GERD - Stable, managed with Prilosec at home - Continue PPI    Primary biliary cirrhosis - Stable, managed with obeticholic acid, will continue    DVT prophylaxis: sq heparin Code Status: Full code Family Communication: patient states wife is  coming in later and e  Disposition Plan: Inpatient- dispo unclear need to see how patient responds to IV antibiotics.  If improved tomorrow can transition him to PO   Consultants:   none  Procedures:   none  Antimicrobials:   Vancomycin 9/9=>  Zosyn 9/9=>   Subjective: Patient awake and watching television.  Reports he feels about the same as when he was admitted.  Attempted to walk today but foot  was exquisitely painful to putting pressure on it. Denies chills or feeling feverish.  Reports no chest pain, chest pressure, shortness of breath, abdominal pain, dysuria, diarrhea.  Pain is well controlled.  Objective: Vitals:   05/27/16 2130 05/28/16 0513 05/28/16 0730 05/28/16 0900  BP: (!) 95/52 110/69    Pulse: (!) 109 98    Resp: 20 20    Temp: 99.5 F (37.5 C) 98.3 F (36.8 C)    TempSrc: Oral Oral    SpO2: 94% 93% 92% 93%  Weight:  58.6 kg (129 lb 1.6 oz)    Height:  '6\' 1"'$  (1.854 m)      Intake/Output Summary (Last 24 hours) at 05/28/16 1152 Last data filed at 05/28/16 0604  Gross per 24 hour  Intake              250 ml  Output                0 ml  Net              250 ml   Filed Weights   05/27/16 1206 05/27/16 1834 05/28/16 0513  Weight: 62.6 kg (138 lb) 58.7 kg (129 lb 6.6 oz) 58.6 kg (129 lb 1.6 oz)    Examination:  General exam: Appears calm and comfortable  Respiratory system: Rhonchi in the middle lung L>R. Respiratory effort normal. Patient wearing nasal cannula. Cardiovascular system: S1 & S2 heard, RRR. No JVD, murmurs, rubs, gallops or clicks. No pedal edema. Gastrointestinal system: Abdomen is nondistended, soft and nontender. No organomegaly or masses felt. Normal bowel sounds heard. Central nervous system: Alert and oriented. No focal neurological deficits. Extremities: Symmetric 5 x 5 power. Skin: erythema noted on left 2-4th digit on foot, somewhat within the margins of marker drawn. Slightly swollen toes, erythema area of left shin improved within drawn line Psychiatry: Judgement and insight appear normal. Mood & affect appropriate.     Data Reviewed: I have personally reviewed following labs and imaging studies  CBC:  Recent Labs Lab 05/27/16 1321 05/28/16 0657  WBC 2.7* 2.9*  NEUTROABS 1.1* 1.2*  HGB 10.7* 10.1*  HCT 33.0* 30.7*  MCV 88.5 88.5  PLT 234 093   Basic Metabolic Panel:  Recent Labs Lab 05/27/16 1321 05/28/16 0657    NA 136 133*  K 3.6 3.5  CL 96* 95*  CO2 33* 34*  GLUCOSE 106* 111*  BUN 10 11  CREATININE 0.73 0.86  CALCIUM 8.1* 7.7*   GFR: Estimated Creatinine Clearance: 77.6 mL/min (by C-G formula based on SCr of 0.86 mg/dL). Liver Function Tests: No results for input(s): AST, ALT, ALKPHOS, BILITOT, PROT, ALBUMIN in the last 168 hours. No results for input(s): LIPASE, AMYLASE in the last 168 hours. No results for input(s): AMMONIA in the last 168 hours. Coagulation Profile: No results for input(s): INR, PROTIME in the last 168 hours. Cardiac Enzymes: No results for input(s): CKTOTAL, CKMB, CKMBINDEX, TROPONINI in the last 168 hours. BNP (last 3 results) No results for input(s): PROBNP in the last 8760 hours.  HbA1C: No results for input(s): HGBA1C in the last 72 hours. CBG: No results for input(s): GLUCAP in the last 168 hours. Lipid Profile: No results for input(s): CHOL, HDL, LDLCALC, TRIG, CHOLHDL, LDLDIRECT in the last 72 hours. Thyroid Function Tests: No results for input(s): TSH, T4TOTAL, FREET4, T3FREE, THYROIDAB in the last 72 hours. Anemia Panel: No results for input(s): VITAMINB12, FOLATE, FERRITIN, TIBC, IRON, RETICCTPCT in the last 72 hours. Sepsis Labs:  Recent Labs Lab 05/27/16 1321 05/27/16 1630  LATICACIDVEN 1.0 1.1    Recent Results (from the past 240 hour(s))  Culture, blood (routine x 2)     Status: None (Preliminary result)   Collection Time: 05/27/16  1:21 PM  Result Value Ref Range Status   Specimen Description BLOOD LEFT ANTECUBITAL  Final   Special Requests BOTTLES DRAWN AEROBIC AND ANAEROBIC 6CC  Final   Culture NO GROWTH < 24 HOURS  Final   Report Status PENDING  Incomplete  Culture, blood (routine x 2)     Status: None (Preliminary result)   Collection Time: 05/27/16  1:37 PM  Result Value Ref Range Status   Specimen Description BLOOD RIGHT ANTECUBITAL  Final   Special Requests BOTTLES DRAWN AEROBIC AND ANAEROBIC 6CC  Final   Culture NO GROWTH <  24 HOURS  Final   Report Status PENDING  Incomplete         Radiology Studies: Dg Foot Complete Left  Result Date: 05/27/2016 CLINICAL DATA:  Patient with left foot redness and swelling for 1 week. EXAM: LEFT FOOT - COMPLETE 3+ VIEW COMPARISON:  None. FINDINGS: Midfoot degenerative changes. First MTP joint degenerative changes. Normal anatomic alignment. No evidence for acute fracture or dislocation posterior calcaneal spurring. Regional soft tissues are unremarkable. IMPRESSION: No acute osseous abnormality. Electronically Signed   By: Lovey Newcomer M.D.   On: 05/27/2016 13:15        Scheduled Meds: . dronabinol  5 mg Oral BID  . feeding supplement  1 Container Oral BID BM  . folic acid  1 mg Oral Daily  . furosemide  20 mg Oral q1800  . furosemide  40 mg Oral Daily  . heparin  5,000 Units Subcutaneous Q8H  . mouth rinse  15 mL Mouth Rinse BID  . mometasone-formoterol  2 puff Inhalation BID  . morphine  15 mg Oral Q12H  . multivitamin with minerals  1 tablet Oral Daily  . nystatin  5 mL Oral QID  . Obeticholic Acid  5 mg Oral Daily  . pantoprazole  40 mg Oral Daily  . piperacillin-tazobactam (ZOSYN)  IV  3.375 g Intravenous Q8H  . polyethylene glycol  34 g Oral BID  . sodium chloride flush  3 mL Intravenous Q12H  . tiotropium  18 mcg Inhalation Daily  . vancomycin  750 mg Intravenous Q12H   Continuous Infusions:    LOS: 0 days    Time spent: 60mn    ANewman Pies MD [facility] Pager [phone] If 7PM-7AM, please contact night-coverage [email or web address] Password [email or web address] 05/28/2016, 11:52 AM

## 2016-05-29 DIAGNOSIS — C7951 Secondary malignant neoplasm of bone: Secondary | ICD-10-CM

## 2016-05-29 DIAGNOSIS — D72819 Decreased white blood cell count, unspecified: Secondary | ICD-10-CM

## 2016-05-29 LAB — GLUCOSE, CAPILLARY: Glucose-Capillary: 95 mg/dL (ref 65–99)

## 2016-05-29 LAB — C-REACTIVE PROTEIN: CRP: 6.1 mg/dL — AB (ref <1.0)

## 2016-05-29 MED ORDER — AMOXICILLIN-POT CLAVULANATE 875-125 MG PO TABS
1.0000 | ORAL_TABLET | Freq: Two times a day (BID) | ORAL | Status: DC
  Administered 2016-05-29 – 2016-05-30 (×2): 1 via ORAL
  Filled 2016-05-29 (×2): qty 1

## 2016-05-29 NOTE — Progress Notes (Signed)
PROGRESS NOTE    Jeffrey Gutierrez  VZD:[id number] DOB: July 05, 1956 DOA: 05/27/2016 PCP: Purvis Kilts, MD    Brief Narrative:  Jeffrey Gutierrez is a 60 y.o. 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 Patient reports being in his usual state until noting swelling of the bilateral feet approximately one week ago. He was evaluated by his oncologist 4 days prior to this admission, was noted to have associated redness and tenderness at the second toe of the left foot and was prescribed Keflex for presumed cellulitis. Patient also takes Lasix twice daily. Over the ensuing 4 days, the swelling has decreased, but pain has persisted unchanged and discoloration has spread to involve the third and fourth digits of the left foot and the dorsal fore- and midfoot. Patient denies fevers or chills, but notes some general malaise over the past couple days. There is no chest pain, palpitations, or new cough. There is no calf swelling, erythema, or tenderness. Patient was scheduled to have chemotherapy 4 days prior to the admission, but this was held due to the apparent infection and leukopenia. Patient denies melena or hematochezia.   Assessment & Plan:   Principal Problem:   Cellulitis Active Problems:   PBC (primary biliary cirrhosis) (HCC)   Metastasis to bone (HCC)   Adenocarcinoma of right lung, stage 4 (HCC)   COPD (chronic obstructive pulmonary disease) (HCC)   Normocytic anemia   Leukopenia   Depression with anxiety   GERD (gastroesophageal reflux disease)   Cellulitis, LLE  - patient afebrile but pt is immunosuppressed  - Left foot erythema, tenderness worsening at admission despite treatment with Keflex   - Radiographs negative for underlying bony involvement; clinically, no  evidence for DVT  - Blood cultures NG x2 days  - Empiric vancomycin was started in ED, will add Zosyn for severe non-purulent disease in immunocompromised pt  - CRP elevated at 5.9 - improvement in erythema and only pain with direct significant contact - will transition to PO Augmentin - monitor and if continues to improve can d/c in am   Right lung cancer, stage IV  - Pt follows with heme/onc at [facility] and is managed with chemotherapy - Last chemo session, 4 days prior to admission, was held due to cellulitis and leukopenia  - Has known spinal mets and undergoing radiation therapy with Dr. Tammi Klippel  - Ongoing management through heme/onc, rad onc    COPD - Stable, no dyspnea or wheezes on admission  - Continue scheduled inhalers, prn albuterol    Leukopenia  - WBC 2,700 on admission and stable relative to CBC from 1 month ago  - Likely secondary to chemotherapy; acute infection possibly contributing  -  WBC stable - will transition to PO Augmentin - redraw CBCD in am  Normocytic anemia  - Hgb 10.7 on admission with normal MCV  - Hgb was 12.4 one month prior, and normal prior to that  - No active blood-loss evident on admission  - Check FOBT  - Continue folate supplementation   - Hgb today of 10.1   GERD - Stable, managed with Prilosec at home - Continue PPI    Primary biliary cirrhosis - Stable, managed with obeticholic acid, will continue    DVT prophylaxis: sq heparin Code Status: Full code Family Communication: patient states wife is  coming in later and e  Disposition Plan: Inpatient- likely discharge tomorrow on PO abx if cellulitis continues to improve   Consultants:   none  Procedures:   none  Antimicrobials:   Vancomycin 9/9=> 9/11  Zosyn 9/9=> 9/11  Augmentin 9/11 =>   Subjective: Patient awake and sitting in chair.  Asking about whether or not he is able to discharge today.  Still voices some pain in his foot with direct contact and walking  distances.  No fevers or chills.  Reports that the bed is very uncomfortable.  Wants to mention he has a sore on his sacrum as to why the bed is so uncomfortable.  Objective: Vitals:   05/28/16 2129 05/29/16 0415 05/29/16 0829 05/29/16 0832  BP: 118/64 130/70    Pulse: 93 89    Resp: (!) 21 20    Temp: 98.8 F (37.1 C) 98.8 F (37.1 C)    TempSrc: Oral Oral    SpO2: 91% 94% (!) 88% (!) 88%  Weight:  59.4 kg (131 lb)    Height:        Intake/Output Summary (Last 24 hours) at 05/29/16 1407 Last data filed at 05/29/16 0609  Gross per 24 hour  Intake              790 ml  Output                0 ml  Net              790 ml   Filed Weights   05/27/16 1834 05/28/16 0513 05/29/16 0415  Weight: 58.7 kg (129 lb 6.6 oz) 58.6 kg (129 lb 1.6 oz) 59.4 kg (131 lb)    Examination:  General exam: Appears calm and comfortable  Respiratory system: Rhonchi in the middle lung L>R. Respiratory effort normal. Patient wearing nasal cannula. Cardiovascular system: S1 & S2 heard, RRR. No JVD, murmurs, rubs, gallops or clicks. No pedal edema. Gastrointestinal system: Abdomen is nondistended, soft and nontender. No organomegaly or masses felt. Normal bowel sounds heard. Central nervous system: Alert and oriented. No focal neurological deficits. Extremities: Symmetric 5 x 5 power. Skin: erythema noted on left 2-4th digit on foot, significantly receded from the margins of marker drawn. Slightly swollen toes, erythema area of left shin resolved.  Some cracking and breakdown noted between 2nd and 3rd toes and 3rd and 4th toes.  Stage I pressure ulcer noted on sacrum Psychiatry: Judgement and insight appear normal. Mood & affect appropriate.     Data Reviewed: I have personally reviewed following labs and imaging studies  CBC:  Recent Labs Lab 05/27/16 1321 05/28/16 0657  WBC 2.7* 2.9*  NEUTROABS 1.1* 1.2*  HGB 10.7* 10.1*  HCT 33.0* 30.7*  MCV 88.5 88.5  PLT 234 604   Basic Metabolic  Panel:  Recent Labs Lab 05/27/16 1321 05/28/16 0657  NA 136 133*  K 3.6 3.5  CL 96* 95*  CO2 33* 34*  GLUCOSE 106* 111*  BUN 10 11  CREATININE 0.73 0.86  CALCIUM 8.1* 7.7*   GFR: Estimated Creatinine Clearance: 78.7 mL/min (by C-G formula based on SCr of 0.86 mg/dL). Liver Function Tests: No results for input(s): AST, ALT, ALKPHOS, BILITOT, PROT, ALBUMIN in the last 168 hours. No results for input(s): LIPASE, AMYLASE in the last 168 hours. No results for input(s): AMMONIA in the last 168 hours. Coagulation Profile: No results for input(s): INR, PROTIME in the last 168 hours. Cardiac Enzymes: No results for input(s): CKTOTAL,  CKMB, CKMBINDEX, TROPONINI in the last 168 hours. BNP (last 3 results) No results for input(s): PROBNP in the last 8760 hours. HbA1C: No results for input(s): HGBA1C in the last 72 hours. CBG:  Recent Labs Lab 05/29/16 0752  GLUCAP 95   Lipid Profile: No results for input(s): CHOL, HDL, LDLCALC, TRIG, CHOLHDL, LDLDIRECT in the last 72 hours. Thyroid Function Tests: No results for input(s): TSH, T4TOTAL, FREET4, T3FREE, THYROIDAB in the last 72 hours. Anemia Panel: No results for input(s): VITAMINB12, FOLATE, FERRITIN, TIBC, IRON, RETICCTPCT in the last 72 hours. Sepsis Labs:  Recent Labs Lab 05/27/16 1321 05/27/16 1630  LATICACIDVEN 1.0 1.1    Recent Results (from the past 240 hour(s))  Culture, blood (routine x 2)     Status: None (Preliminary result)   Collection Time: 05/27/16  1:21 PM  Result Value Ref Range Status   Specimen Description BLOOD LEFT ANTECUBITAL  Final   Special Requests BOTTLES DRAWN AEROBIC AND ANAEROBIC 6CC  Final   Culture NO GROWTH 2 DAYS  Final   Report Status PENDING  Incomplete  Culture, blood (routine x 2)     Status: None (Preliminary result)   Collection Time: 05/27/16  1:37 PM  Result Value Ref Range Status   Specimen Description BLOOD RIGHT ANTECUBITAL  Final   Special Requests BOTTLES DRAWN AEROBIC  AND ANAEROBIC 6CC  Final   Culture NO GROWTH 2 DAYS  Final   Report Status PENDING  Incomplete         Radiology Studies: No results found.      Scheduled Meds: . dronabinol  5 mg Oral BID  . feeding supplement  1 Container Oral BID BM  . folic acid  1 mg Oral Daily  . furosemide  20 mg Oral q1800  . furosemide  40 mg Oral Daily  . heparin  5,000 Units Subcutaneous Q8H  . mouth rinse  15 mL Mouth Rinse BID  . mometasone-formoterol  2 puff Inhalation BID  . morphine  15 mg Oral Q12H  . multivitamin with minerals  1 tablet Oral Daily  . nystatin  5 mL Oral QID  . Obeticholic Acid  5 mg Oral Daily  . pantoprazole  40 mg Oral Daily  . piperacillin-tazobactam (ZOSYN)  IV  3.375 g Intravenous Q8H  . polyethylene glycol  34 g Oral BID  . sodium chloride flush  3 mL Intravenous Q12H  . tiotropium  18 mcg Inhalation Daily  . vancomycin  750 mg Intravenous Q12H   Continuous Infusions:    LOS: 1 day    Time spent: 67mn    ANewman Pies MD [facility] Pager [phone] If 7PM-7AM, please contact night-coverage [email or web address] Password [email or web address] 05/29/2016, 2:07 PM

## 2016-05-30 ENCOUNTER — Ambulatory Visit: Payer: Self-pay | Admitting: Radiation Oncology

## 2016-05-30 LAB — GLUCOSE, CAPILLARY: Glucose-Capillary: 125 mg/dL — ABNORMAL HIGH (ref 65–99)

## 2016-05-30 MED ORDER — AMOXICILLIN-POT CLAVULANATE 875-125 MG PO TABS: 1.0000 | ORAL_TABLET | Freq: Two times a day (BID) | ORAL | 0 refills | Status: DC

## 2016-05-30 MED ORDER — HEPARIN SOD (PORK) LOCK FLUSH 100 UNIT/ML IV SOLN
500.0000 [IU] | INTRAVENOUS | Status: DC | PRN
  Administered 2016-05-30: 500 [IU]
  Filled 2016-05-30: qty 5

## 2016-05-30 NOTE — Discharge Planning (Signed)
Pt being discharged home, vitals stable, no c/o pain, porta cath de accessed, all pt belongings sent with pt, discharge paperwork sent with pt, no questions for the nurse

## 2016-05-30 NOTE — Discharge Summary (Signed)
Physician Discharge Summary  Jeffrey Gutierrez [id number] DOB: 1956/01/20 DOA: 05/27/2016  PCP: Purvis Kilts, MD  Admit date: 05/27/2016 Discharge date: 05/30/2016  Admitted From: Home Disposition:  Home  Recommendations for Outpatient Follow-up:  1. Follow up with PCP in 1-2 weeks 2. 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 Patient reports being in his usual state until noting swelling of the bilateral feet approximately one week ago. He was evaluated by his oncologist 4 days prior to this admission, was noted to have associated redness and tenderness at the second toe of the left foot and was prescribed Keflex for presumed cellulitis. Patient also takes Lasix twice daily. Over the ensuing 4 days, the swelling has decreased, but pain has persisted unchanged and discoloration has spread to involve the third and fourth digits of the left foot and the dorsal fore-and midfoot. Patient denies fevers or chills, but notes some general malaise over the past couple days. There is no chest pain, palpitations, or new cough. There is no calf swelling, erythema, or tenderness. Patient was scheduled to have chemotherapy 4 days prior to the admission, but this was held due to the apparent infection and leukopenia. Patient denies melena or hematochezia.  Over course of admission patient improved with IV antibiotics and was transitioned to Augmentin the day prior to discharge.  Erythema of foot and swelling improved.  He was discharged home with instructions to follow  up with his primary doctor and his oncologist.   Discharge Diagnoses:  Principal Problem:   Cellulitis Active Problems:   PBC (primary biliary cirrhosis) ([place])   Metastasis to bone (HCC)   Adenocarcinoma of right lung, stage 4 (HCC)   COPD (chronic obstructive pulmonary disease) (HCC)   Normocytic anemia   Leukopenia   Depression with anxiety   GERD (gastroesophageal reflux disease)  Cellulitis, LLE  - patient afebrile but pt is immunosuppressed  - Left foot erythema, tenderness worsening at admission despite treatment with Keflex since 9/5 or 9/6  - Radiographs negative for underlying bony involvement; clinically, no evidence for DVT  - Blood cultures NG 3 days  - Empiric vancomycin was started in ED, will add Zosyn for severe non-purulent disease in immunocompromised pt  - transitioned to Augmentin yesterday and marked improvement noted  Right lung cancer, stage IV  - Pt follows with heme/onc at [facility] and is managed with chemotherapy - Last chemo session, 4 days prior to admission, was held due to cellulitis and leukopenia  - Has known spinal mets and undergoing radiation therapy with Dr. Tammi Klippel  - Ongoing management through heme/onc, rad onc   COPD - Stable, no dyspnea or wheezes on admission  - Continue scheduled inhalers, prn albuterol   Leukopenia  - WBC 2,700 on admission and stable relative to CBC from 1 month ago  - Likely secondary to chemotherapy; acute infection possibly contributing  - WBC this am of 2.9 with 42 neutrophils and occasional bands noted  Normocytic anemia  - Hgb 10.7 on admission with normal MCV  - Hgb was 12.4 one month prior, and normal prior to that  - No active blood-loss evident  on admission  - Check FOBT  - Continue folate supplementation  - Hgb today of 10.1   GERD - Stable, managed with Prilosec at home - Continue PPI   Primary biliary cirrhosis - Stable, managed with obeticholic acid, will continue   Discharge  Instructions  Discharge Instructions    Call MD for:  difficulty breathing, headache or visual disturbances    Complete by:  As directed   Call MD for:  persistant dizziness or light-headedness    Complete by:  As directed   Call MD for:  redness, tenderness, or signs of infection (pain, swelling, redness, odor or green/yellow discharge around incision site)    Complete by:  As directed   Call MD for:  temperature >100.4    Complete by:  As directed   Diet - low sodium heart healthy    Complete by:  As directed   Increase activity slowly    Complete by:  As directed       Medication List    STOP taking these medications   cephALEXin 500 MG capsule Commonly known as:  KEFLEX   DULoxetine 30 MG capsule Commonly known as:  CYMBALTA   levofloxacin 500 MG tablet Commonly known as:  LEVAQUIN     TAKE these medications   albuterol 108 (90 Base) MCG/ACT inhaler Commonly known as:  PROVENTIL HFA;VENTOLIN HFA Inhale 2 puffs into the lungs every 4 (four) hours as needed for wheezing or shortness of breath.   amoxicillin-clavulanate 875-125 MG tablet Commonly known as:  AUGMENTIN Take 1 tablet by mouth every 12 (twelve) hours.   budesonide-formoterol 160-4.5 MCG/ACT inhaler Commonly known as:  SYMBICORT Inhale 2 puffs into the lungs 2 (two) times daily.   dronabinol 5 MG capsule Commonly known as:  MARINOL Take 1 capsule by mouth 2 (two) times daily.   feeding supplement Liqd Take 1 Container by mouth 2 (two) times daily between meals.   FIRST-DUKES MOUTHWASH Susp Take 15 mLs by mouth 5 (five) times daily.   folic acid 1 MG tablet Commonly known as:  FOLVITE Take 1 mg by mouth daily.   furosemide 20 MG tablet Commonly known as:  LASIX Take 20-40 tablets by mouth 2 (two) times daily. 40 mg in the morning and 20 mg at bedtime.   ibuprofen 200 MG tablet Commonly known as:  ADVIL,MOTRIN Take 200 mg by mouth every 6 (six) hours as needed for mild pain or moderate pain.  Reported on 12/14/2015   morphine 15 MG tablet Commonly known as:  MSIR Take 1 tablet (15 mg total) by mouth every 4 (four) hours as needed for severe pain.   morphine 15 MG 12 hr tablet Commonly known as:  MS CONTIN Take 1 tablet (15 mg total) by mouth every 12 (twelve) hours.   multivitamin with minerals Tabs tablet Take 1 tablet by mouth daily.   nystatin 100000 UNIT/ML suspension Commonly known as:  MYCOSTATIN Take 5 mLs by mouth 4 (four) times daily.   OCALIVA 5 MG Tabs Generic drug:  Obeticholic Acid Take 1 tablet by mouth daily. What changed:  how much to take   omeprazole 20 MG capsule Commonly known as:  PRILOSEC Take 1 capsule (20 mg total) by mouth daily.   ondansetron 8 MG tablet Commonly known as:  ZOFRAN Take 1 tablet (8 mg total) by mouth every 8 (eight) hours as needed for nausea or vomiting.   polyethylene glycol packet Commonly known as:  MIRALAX / GLYCOLAX Take 34 g by  mouth 2 (two) times daily.   predniSONE 10 MG tablet Commonly known as:  DELTASONE Take 1 tablet (10 mg total) by mouth daily with breakfast. Take 6 tablets today and then decrease by 1 tablet daily until none are left.   prochlorperazine 10 MG tablet Commonly known as:  COMPAZINE Take 10 mg by mouth every 6 (six) hours as needed for nausea/vomiting.   SONAFINE EX Apply topically.   sucralfate 1 g tablet Commonly known as:  CARAFATE Take 1 tablet by mouth 4 (four) times daily.   tiotropium 18 MCG inhalation capsule Commonly known as:  SPIRIVA Place 18 mcg into inhaler and inhale daily.      Follow-up Information    Purvis Kilts, MD Follow up in 1 week(s).   Specialty:  Family Medicine Contact information: [address] [place] [address] [phone]          Allergies  Allergen Reactions  . Dihydrocodeine Anaphylaxis  . Hydrocodone Anaphylaxis and Other (See Comments)    Throat closes up  Patient states PCP gave 11/2015 and he has been  tolerating it.  . Doxycycline Nausea And Vomiting  . Linzess [Linaclotide] Diarrhea    Consultations:  none   Procedures/Studies: Dg Foot Complete Left  Result Date: 05/27/2016 CLINICAL DATA:  Patient with left foot redness and swelling for 1 week. EXAM: LEFT FOOT - COMPLETE 3+ VIEW COMPARISON:  None. FINDINGS: Midfoot degenerative changes. First MTP joint degenerative changes. Normal anatomic alignment. No evidence for acute fracture or dislocation posterior calcaneal spurring. Regional soft tissues are unremarkable. IMPRESSION: No acute osseous abnormality. Electronically Signed   By: Lovey Newcomer M.D.   On: 05/27/2016 13:15      Subjective: Patient doing remarkable well today.  Voices he would like to go home.  Voices significant improvement in the redness of his foot and the swelling of his toes.   Discharge Exam: Vitals:   05/29/16 2225 05/30/16 0650  BP: 108/60 123/80  Pulse: 93 88  Resp: (!) 21 (!) 21  Temp: 99.8 F (37.7 C) 98.7 F (37.1 C)   Vitals:   05/29/16 2225 05/30/16 0650 05/30/16 0806 05/30/16 0809  BP: 108/60 123/80    Pulse: 93 88    Resp: (!) 21 (!) 21    Temp: 99.8 F (37.7 C) 98.7 F (37.1 C)    TempSrc: Oral Oral    SpO2: 96% 97% 92% 92%  Weight:  58.5 kg (128 lb 14.4 oz)    Height:        General: Pt is alert, awake, not in acute distress Cardiovascular: RRR, S1/S2 +, no rubs, no gallops Respiratory: CTA bilaterally, no wheezing, no rhonchi Abdominal: Soft, NT, ND, bowel sounds + Extremities: edema of toes improved from previous exam, no cyanosis Skin: erythema of toes significantly improved    The results of significant diagnostics from this hospitalization (including imaging, microbiology, ancillary and laboratory) are listed below for reference.     Microbiology: Recent Results (from the past 240 hour(s))  Culture, blood (routine x 2)     Status: None (Preliminary result)   Collection Time: 05/27/16  1:21 PM  Result Value Ref Range  Status   Specimen Description BLOOD LEFT ANTECUBITAL  Final   Special Requests BOTTLES DRAWN AEROBIC AND ANAEROBIC 6CC  Final   Culture NO GROWTH 3 DAYS  Final   Report Status PENDING  Incomplete  Culture, blood (routine x 2)     Status: None (Preliminary result)   Collection Time: 05/27/16  1:37 PM  Result Value Ref Range Status   Specimen Description BLOOD RIGHT ANTECUBITAL  Final   Special Requests BOTTLES DRAWN AEROBIC AND ANAEROBIC 6CC  Final   Culture NO GROWTH 3 DAYS  Final   Report Status PENDING  Incomplete     Labs: BNP (last 3 results)  Recent Labs  04/27/16 2020  BNP 95.0   Basic Metabolic Panel:  Recent Labs Lab 05/27/16 1321 05/28/16 0657  NA 136 133*  K 3.6 3.5  CL 96* 95*  CO2 33* 34*  GLUCOSE 106* 111*  BUN 10 11  CREATININE 0.73 0.86  CALCIUM 8.1* 7.7*   Liver Function Tests: No results for input(s): AST, ALT, ALKPHOS, BILITOT, PROT, ALBUMIN in the last 168 hours. No results for input(s): LIPASE, AMYLASE in the last 168 hours. No results for input(s): AMMONIA in the last 168 hours. CBC:  Recent Labs Lab 05/27/16 1321 05/28/16 0657  WBC 2.7* 2.9*  NEUTROABS 1.1* 1.2*  HGB 10.7* 10.1*  HCT 33.0* 30.7*  MCV 88.5 88.5  PLT 234 233   Cardiac Enzymes: No results for input(s): CKTOTAL, CKMB, CKMBINDEX, TROPONINI in the last 168 hours. BNP: Invalid input(s): POCBNP CBG:  Recent Labs Lab 05/29/16 0752 05/30/16 0818  GLUCAP 95 125*   D-Dimer No results for input(s): DDIMER in the last 72 hours. Hgb A1c No results for input(s): HGBA1C in the last 72 hours. Lipid Profile No results for input(s): CHOL, HDL, LDLCALC, TRIG, CHOLHDL, LDLDIRECT in the last 72 hours. Thyroid function studies No results for input(s): TSH, T4TOTAL, T3FREE, THYROIDAB in the last 72 hours.  Invalid input(s): FREET3 Anemia work up No results for input(s): VITAMINB12, FOLATE, FERRITIN, TIBC, IRON, RETICCTPCT in the last 72 hours. Urinalysis    Component Value  Date/Time   COLORURINE AMBER (A) 04/27/2016 2344   APPEARANCEUR HAZY (A) 04/27/2016 2344   LABSPEC 1.010 04/27/2016 2344   PHURINE 7.0 04/27/2016 2344   GLUCOSEU NEGATIVE 04/27/2016 2344   HGBUR NEGATIVE 04/27/2016 2344   BILIRUBINUR NEGATIVE 04/27/2016 2344   KETONESUR NEGATIVE 04/27/2016 2344   PROTEINUR NEGATIVE 04/27/2016 2344   NITRITE NEGATIVE 04/27/2016 2344   LEUKOCYTESUR NEGATIVE 04/27/2016 2344   Sepsis Labs Invalid input(s): PROCALCITONIN,  WBC,  LACTICIDVEN Microbiology Recent Results (from the past 240 hour(s))  Culture, blood (routine x 2)     Status: None (Preliminary result)   Collection Time: 05/27/16  1:21 PM  Result Value Ref Range Status   Specimen Description BLOOD LEFT ANTECUBITAL  Final   Special Requests BOTTLES DRAWN AEROBIC AND ANAEROBIC 6CC  Final   Culture NO GROWTH 3 DAYS  Final   Report Status PENDING  Incomplete  Culture, blood (routine x 2)     Status: None (Preliminary result)   Collection Time: 05/27/16  1:37 PM  Result Value Ref Range Status   Specimen Description BLOOD RIGHT ANTECUBITAL  Final   Special Requests BOTTLES DRAWN AEROBIC AND ANAEROBIC 6CC  Final   Culture NO GROWTH 3 DAYS  Final   Report Status PENDING  Incomplete     Time coordinating discharge: Less than 30 minutes  SIGNED:   Newman Pies, MD  [facility] 05/30/2016, 5:50 PM Pager [phone] If 7PM-7AM, please contact night-coverage [email or web address] Password [email or web address]

## 2016-05-30 NOTE — Discharge Instructions (Signed)

## 2016-06-01 LAB — CULTURE, BLOOD (ROUTINE X 2)
CULTURE: NO GROWTH
Culture: NO GROWTH

## 2016-06-07 ENCOUNTER — Emergency Department (HOSPITAL_COMMUNITY): Payer: 59

## 2016-06-07 ENCOUNTER — Emergency Department (HOSPITAL_COMMUNITY)
Admission: EM | Admit: 2016-06-07 | Discharge: 2016-06-07 | Disposition: A | Discharge status: Home / Self Care | Payer: 59 | Attending: Emergency Medicine | Admitting: Emergency Medicine

## 2016-06-07 ENCOUNTER — Encounter (HOSPITAL_COMMUNITY): Payer: Self-pay

## 2016-06-07 DIAGNOSIS — I10 Essential (primary) hypertension: Secondary | ICD-10-CM | POA: Insufficient documentation

## 2016-06-07 DIAGNOSIS — F17210 Nicotine dependence, cigarettes, uncomplicated: Secondary | ICD-10-CM | POA: Diagnosis not present

## 2016-06-07 DIAGNOSIS — J449 Chronic obstructive pulmonary disease, unspecified: Secondary | ICD-10-CM | POA: Diagnosis not present

## 2016-06-07 DIAGNOSIS — Z79899 Other long term (current) drug therapy: Secondary | ICD-10-CM | POA: Insufficient documentation

## 2016-06-07 DIAGNOSIS — R55 Syncope and collapse: Secondary | ICD-10-CM | POA: Insufficient documentation

## 2016-06-07 DIAGNOSIS — Z792 Long term (current) use of antibiotics: Secondary | ICD-10-CM | POA: Diagnosis not present

## 2016-06-07 DIAGNOSIS — Z85118 Personal history of other malignant neoplasm of bronchus and lung: Secondary | ICD-10-CM | POA: Diagnosis not present

## 2016-06-07 DIAGNOSIS — Z85830 Personal history of malignant neoplasm of bone: Secondary | ICD-10-CM | POA: Insufficient documentation

## 2016-06-07 LAB — COMPREHENSIVE METABOLIC PANEL
ALBUMIN: 2.6 g/dL — AB (ref 3.5–5.0)
ALT: 12 U/L — ABNORMAL LOW (ref 17–63)
ANION GAP: 5 (ref 5–15)
AST: 22 U/L (ref 15–41)
Alkaline Phosphatase: 108 U/L (ref 38–126)
BUN: 12 mg/dL (ref 6–20)
CO2: 33 mmol/L — AB (ref 22–32)
Calcium: 8.4 mg/dL — ABNORMAL LOW (ref 8.9–10.3)
Chloride: 96 mmol/L — ABNORMAL LOW (ref 101–111)
Creatinine, Ser: 0.83 mg/dL (ref 0.61–1.24)
GFR calc Af Amer: >60 mL/min (ref >60)
GFR calc non Af Amer: >60 mL/min (ref >60)
GLUCOSE: 87 mg/dL (ref 65–99)
POTASSIUM: 3.8 mmol/L (ref 3.5–5.1)
SODIUM: 134 mmol/L — AB (ref 135–145)
Total Bilirubin: 0.4 mg/dL (ref 0.3–1.2)
Total Protein: 7.1 g/dL (ref 6.5–8.1)

## 2016-06-07 LAB — CBC WITH DIFFERENTIAL/PLATELET
BASOS ABS: 0 10*3/uL (ref 0.0–0.1)
Basophils Relative: 1 %
Eosinophils Absolute: 0 10*3/uL (ref 0.0–0.7)
Eosinophils Relative: 0 %
HEMATOCRIT: 32.8 % — AB (ref 39.0–52.0)
Hemoglobin: 10.7 g/dL — ABNORMAL LOW (ref 13.0–17.0)
LYMPHS ABS: 0.6 10*3/uL — AB (ref 0.7–4.0)
LYMPHS PCT: 20 %
MCH: 28.5 pg (ref 26.0–34.0)
MCHC: 32.6 g/dL (ref 30.0–36.0)
MCV: 87.2 fL (ref 78.0–100.0)
MONO ABS: 0.5 10*3/uL (ref 0.1–1.0)
MONOS PCT: 15 %
NEUTROS ABS: 1.9 10*3/uL (ref 1.7–7.7)
Neutrophils Relative %: 64 %
Platelets: 244 10*3/uL (ref 150–400)
RBC: 3.76 MIL/uL — ABNORMAL LOW (ref 4.22–5.81)
RDW: 17.8 % — AB (ref 11.5–15.5)
WBC: 3 10*3/uL — ABNORMAL LOW (ref 4.0–10.5)

## 2016-06-07 LAB — TROPONIN I: Troponin I: <0.03 ng/mL (ref <0.03)

## 2016-06-07 MED ORDER — SODIUM CHLORIDE 0.9 % IV BOLUS (SEPSIS)
1000.0000 mL | Freq: Once | INTRAVENOUS | Status: AC
  Administered 2016-06-07: 1000 mL via INTRAVENOUS

## 2016-06-07 NOTE — ED Notes (Signed)
Bed: WA21 Expected date:  Expected time:  Means of arrival:  Comments: EMS-hyptotensive

## 2016-06-07 NOTE — ED Triage Notes (Signed)
Per EMS-states they were called out for burinng in abdomin-upon assessment, patient's BP 62/30, HR 96-states abdominal pain resolved-BP 104 68 after 400 mls of NS

## 2016-06-07 NOTE — ED Notes (Signed)
PT DISCHARGED. INSTRUCTIONS GIVEN. AAOX4. PT IN NO APPARENT DISTRESS OR PAIN. THE OPPORTUNITY TO ASK QUESTIONS WAS PROVIDED. 

## 2016-06-07 NOTE — ED Provider Notes (Signed)
[facility] DEPT Provider Note   CSN: [id number] Arrival date & time: 06/07/16  1146     History   Chief Complaint Chief Complaint  Patient presents with  . Hypotension    HPI Jeffrey Gutierrez is a 60 y.o. male.  HPI  Patient presents after an episode of lightheadedness, blurring of vision, hypotension. Patient states that he was in his usual state of health, at work, working on a [employer], jewellery, when he suddenly felt the aforementioned changes. Patient took his blood pressure reading, and found to be substantially low. Patient ate a pack of salt, and an attempt to correct his blood pressure. By the time of ED arrival the patient states that he feels back to normal. He has multiple medical issues including lung cancer, stage IV, COPD. However, the patient denies recent health changes, medication changes. Last chemotherapy was 2 weeks ago.   Past Medical History:  Diagnosis Date  . Bone cancer ([place])   . COPD (chronic obstructive pulmonary disease) ([place])   . Diverticulitis   . Essential hypertension   . GERD (gastroesophageal reflux disease)   . Hiatal hernia   . Hyperplastic rectal polyp   . Lung cancer ([place])   . 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 S/P colonoscopy 2009   Pancolonic diverticula, hyperplastic rectal polyps    Patient Active Problem List   Diagnosis Date Noted  . Normocytic anemia 05/27/2016  . Leukopenia 05/27/2016  . Depression with anxiety 05/27/2016  . GERD (gastroesophageal reflux disease) 05/27/2016  . Cellulitis 05/27/2016  . COPD (chronic obstructive pulmonary disease) ([place]) 04/28/2016  . Protein-calorie malnutrition, severe 04/28/2016  . Acute respiratory failure with hypoxia ([place]) 04/27/2016  . 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 Metastasis to bone ([place]) 04/01/2016  . Adenocarcinoma of right lung, stage 4 ([place]) 04/01/2016  . Loss of weight 12/22/2015  . 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  RUQ pain 09/27/2015  . Hepatic cirrhosis ([place]) 07/26/2015  . Hiatal hernia   . Reflux esophagitis   . Diverticulosis of colon without hemorrhage   . History of colonic polyps   . Change in bowel function 03/10/2015  . Constipation 02/04/2015  . 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 OTHER CHRONIC NONALCOHOLIC LIVER DISEASE 86/75/4492  . Abdominal pain 01/20/2009  . [place] NONSPECIFIC IMMUNOLOGICAL FINDINGS 01/20/2009  . CHOLECYSTECTOMY, HX OF 01/20/2009    Past Surgical History:  Procedure Laterality Date  . Arm Surgery Left    ulnar nerve release  . CHOLECYSTECTOMY    . COLONOSCOPY  2009   Pancolonic diverticula, hyperplastic rectal polyps  . COLONOSCOPY [name] 03/24/2015   RMR: Single colonic polyp removed as described above. Pancolonic diverticulosis. Abnormal sigmoid colon consistent with stigmata of recent diverticulitis.   Marland Kitchen ESOPHAGOGASTRODUODENOSCOPY [name] 06/28/2015   RMR: mild erosive reflux/small HH  . INGUINAL HERNIA REPAIR Right Feb 2012   Dr. Arnoldo Morale   . INGUINAL HERNIA REPAIR Left 11/12/2015   Procedure: HERNIA REPAIR INGUINAL ADULT WITH MESH;  Surgeon: Aviva Signs, MD;  Location: [facility];  Service: General;  Laterality: Left;  . INSERTION OF MESH Left 11/12/2015   Procedure: INSERTION OF MESH;  Surgeon: Aviva Signs, MD;  Location: [facility];  Service: General;  Laterality: Left;  . LIVER BIOPSY    . NASAL SEPTUM SURGERY         Home Medications    Prior to Admission medications   Medication Sig Start Date End Date Taking? Authorizing Provider  albuterol (PROVENTIL HFA;VENTOLIN HFA) 108 (90 Base) MCG/ACT inhaler Inhale 2 puffs into the lungs every 4 (four) hours as needed for wheezing or shortness of breath. 09/17/15  Yes Merrily Pew, MD  amoxicillin-clavulanate (AUGMENTIN) 875-125 MG tablet Take 1 tablet by mouth every 12 (twelve) hours. 05/30/16  Yes Wallis Bamberg, MD  budesonide-formoterol [facility]) 160-4.5 MCG/ACT inhaler Inhale 2 puffs into  the lungs 2 (two) times daily.   Yes Historical Provider, MD  Diphenhyd-Hydrocort-Nystatin (FIRST-DUKES MOUTHWASH) SUSP Take 15 mLs by mouth 5 (five) times daily. 05/08/16  Yes Historical Provider, MD  dronabinol (MARINOL) 5 MG capsule Take 1 capsule by mouth 2 (two) times daily. 04/26/16  Yes Historical Provider, MD  DULoxetine (CYMBALTA) 30 MG capsule Take 30 mg by mouth daily. 05/09/16  Yes Historical Provider, MD  feeding supplement (BOOST HIGH PROTEIN) LIQD Take 1 Container by mouth 2 (two) times daily between meals.   Yes Historical Provider, MD  folic acid (FOLVITE) 1 MG tablet Take 1 mg by mouth daily.   Yes Historical Provider, MD  furosemide (LASIX) 20 MG tablet Take 20-40 tablets by mouth 2 (two) times daily. 40 mg in the morning and 20 mg at bedtime. 05/12/16  Yes Historical Provider, MD  ibuprofen (ADVIL,MOTRIN) 200 MG tablet Take 200 mg by mouth every 6 (six) hours as needed for mild pain or moderate pain. Reported on 12/14/2015   Yes Historical Provider, MD  morphine (MS CONTIN) 15 MG 12 hr tablet Take 1 tablet (15 mg total) by mouth every 12 (twelve) hours. 04/13/16  Yes Patrici Ranks, MD  morphine (MSIR) 15 MG tablet Take 1 tablet (15 mg total) by mouth every 4 (four) hours as needed for severe pain. 04/13/16  Yes Patrici Ranks, MD  Multiple Vitamin (MULTIVITAMIN WITH MINERALS) TABS tablet Take 1 tablet by mouth daily.   Yes Historical Provider, MD  nystatin (MYCOSTATIN) 100000 UNIT/ML suspension Take 5 mLs by mouth 4 (four) times daily as needed (thrush/blisters from chemo treatment.).  05/23/16  Yes Historical Provider, MD  OCALIVA 5 MG TABS Take 1 tablet by mouth daily. Patient taking differently: Take 5 mg by mouth daily.  03/02/16  Yes Annitta Needs, NP  omeprazole (PRILOSEC) 20 MG capsule Take 1 capsule (20 mg total) by mouth daily. 01/24/16  Yes Mahala Menghini, PA-C  polyethylene glycol (MIRALAX / GLYCOLAX) packet Take 34 g by mouth 2 (two) times daily.    Yes Historical Provider, MD   prochlorperazine (COMPAZINE) 10 MG tablet Take 10 mg by mouth every 6 (six) hours as needed for nausea/vomiting. 05/02/16 05/02/17 Yes Historical Provider, MD  sucralfate (CARAFATE) 1 g tablet Take 1 tablet by mouth 4 (four) times daily. 05/26/16  Yes Historical Provider, MD  tiotropium (SPIRIVA) 18 MCG inhalation capsule Place 18 mcg into inhaler and inhale daily.   Yes Historical Provider, MD  ondansetron (ZOFRAN) 8 MG tablet Take 1 tablet (8 mg total) by mouth every 8 (eight) hours as needed for nausea or vomiting. Patient not taking: Reported on 06/07/2016 04/03/16   Patrici Ranks, MD  predniSONE (DELTASONE) 10 MG tablet Take 1 tablet (10 mg total) by mouth daily with breakfast. Take 6 tablets today and then decrease by 1 tablet daily until none are left. Patient not taking: Reported on 06/07/2016 04/29/16   Erline Hau, MD    Family History Family History  Problem Relation Age of Onset  . Lung cancer Mother   . Pneumonia Father   . Lung  cancer Father   . Pancreatitis Father     Social History Social History  Substance Use Topics  . Smoking status: Current Some Day Smoker    Packs/day: 0.50    Years: 43.00    Types: Cigarettes  . Smokeless tobacco: Never Used  . Alcohol use 0.0 oz/week     Comment: occassional     Allergies   Dihydrocodeine; Hydrocodone; Doxycycline; and Linzess [linaclotide]   Review of Systems Review of Systems  Constitutional:       Per HPI, otherwise negative  HENT:       Per HPI, otherwise negative  Eyes: Positive for visual disturbance.  Respiratory:       Per HPI, otherwise negative  Cardiovascular:       Per HPI, otherwise negative  Gastrointestinal: Negative for vomiting.  Endocrine:       Negative aside from HPI  Genitourinary:       Neg aside from HPI   Musculoskeletal:       Per HPI, otherwise negative  Skin: Negative.   Neurological: Positive for light-headedness. Negative for syncope.     Physical Exam Updated  Vital Signs BP 117/77 (BP Location: Right Arm)   Pulse 88   Temp 99 F (37.2 C) (Oral)   Resp 18   Ht '5\' 11"'$  (1.803 m)   Wt 134 lb (60.8 kg)   SpO2 100%   BMI 18.69 kg/m   Physical Exam  Constitutional: He is oriented to person, place, and time. He appears well-developed. No distress.  HENT:  Head: Normocephalic and atraumatic.  Eyes: Conjunctivae and EOM are normal.  Cardiovascular: Normal rate and regular rhythm.   Pulmonary/Chest: Effort normal. No stridor. No respiratory distress.    Abdominal: He exhibits no distension.  Musculoskeletal: He exhibits no edema.  Neurological: He is alert and oriented to person, place, and time.  Skin: Skin is warm and dry.  Psychiatric: He has a normal mood and affect.  Nursing note and vitals reviewed.    ED Treatments / Results  Labs (all labs ordered are listed, but only abnormal results are displayed) Labs Reviewed  COMPREHENSIVE METABOLIC PANEL - Abnormal; Notable for the following:       Result Value   Sodium 134 (*)    Chloride 96 (*)    CO2 33 (*)    Calcium 8.4 (*)    Albumin 2.6 (*)    ALT 12 (*)    All other components within normal limits  CBC WITH DIFFERENTIAL/PLATELET - Abnormal; Notable for the following:    WBC 3.0 (*)    RBC 3.76 (*)    Hemoglobin 10.7 (*)    HCT 32.8 (*)    RDW 17.8 (*)    Lymphs Abs 0.6 (*)    All other components within normal limits  TROPONIN I    EKG  EKG Interpretation  Date/Time:  Wednesday June 07 2016 13:20:03 EDT Ventricular Rate:  86 PR Interval:    QRS Duration: 105 QT Interval:  385 QTC Calculation: 461 R Axis:   106 Text Interpretation:  Sinus rhythm Consider right ventricular hypertrophy Minimal ST elevation, inferior leads No significant change since last tracing Abnormal ekg Confirmed by Carmin Muskrat  MD [phone]) on 06/07/2016 2:27:34 PM       Radiology Dg Chest 2 View  Result Date: 06/07/2016 CLINICAL DATA:  Near syncope EXAM: CHEST  2 VIEW COMPARISON:   04/27/2016 chest x-ray FINDINGS: Porta catheter on the right with loose kink at the  level of the clavicle crossing. Catheter tip is at the SVC level. Normal heart size and mediastinal contours. Emphysema. There is no edema, consolidation, effusion, or pneumothorax. Nodular density over the right chest wall could be pulmonary or nipple shadow based on 04/27/2016 chest CT. Patient's right thoracic mass and spinal erosion are obscured. IMPRESSION: No evidence of acute disease. Electronically Signed   By: Monte Fantasia M.D.   On: 06/07/2016 13:51    Procedures Procedures (including critical care time)  Medications Ordered in ED Medications  sodium chloride 0.9 % bolus 1,000 mL (1,000 mLs Intravenous New Bag/Given 06/07/16 1358)     Initial Impression / Assessment and Plan / ED Course  I have reviewed the triage vital signs and the nursing notes.  Pertinent labs & imaging results that were available during my care of the patient were reviewed by me and considered in my medical decision making (see chart for details). On repeat exam the patient is in no distress, no new complaints, aware of all findings.  Patient presents after episode of near-syncope. Here the patient is awake and alert, with unremarkable blood pressure, reassuring physical exam, no distress. No evidence for infection, sustained arrhythmia, bacteremia, sepsis, neutropenia. Some suspicion for dehydration when the patient received IV fluids. With no new complaints, no ongoing changes, patient discharged in stable condition to follow-up with oncology, primary care.    Carmin Muskrat, MD 06/07/16 [phone]

## 2016-06-07 NOTE — Discharge Instructions (Signed)
As discussed, your evaluation today has been largely reassuring.  But, it is important that you monitor your condition carefully, and do not hesitate to return to the ED if you develop new, or concerning changes in your condition. ? ?Otherwise, please follow-up with your physician for appropriate ongoing care. ? ?

## 2016-06-13 ENCOUNTER — Ambulatory Visit
Admission: RE | Admit: 2016-06-13 | Discharge: 2016-06-13 | Disposition: A | Discharge status: Home / Self Care | Payer: 59 | Source: Ambulatory Visit | Attending: Radiation Oncology | Admitting: Radiation Oncology

## 2016-06-13 ENCOUNTER — Encounter: Payer: Self-pay | Admitting: Radiation Oncology

## 2016-06-13 VITALS — BP 111/71 | HR 88 | Temp 97.5°F | Resp 18 | Ht 71.0 in | Wt 134.9 lb

## 2016-06-13 DIAGNOSIS — J441 Chronic obstructive pulmonary disease with (acute) exacerbation: Secondary | ICD-10-CM | POA: Diagnosis not present

## 2016-06-13 DIAGNOSIS — C3491 Malignant neoplasm of unspecified part of right bronchus or lung: Secondary | ICD-10-CM | POA: Insufficient documentation

## 2016-06-13 DIAGNOSIS — C7951 Secondary malignant neoplasm of bone: Secondary | ICD-10-CM

## 2016-06-13 DIAGNOSIS — M7989 Other specified soft tissue disorders: Secondary | ICD-10-CM | POA: Insufficient documentation

## 2016-06-13 NOTE — Progress Notes (Signed)
Radiation Oncology         [phone] ________________________________  Name: Jeffrey Gutierrez MRN: [id number]  Date: 06/13/2016  DOB: 19-Oct-1955  Post Treatment Note  CC: Purvis Kilts, MD  Sharilyn Sites, MD  Diagnosis:   60 y.o. male with stage IV, NSCLC, adenocarcinoma of the right lung with disease in the spine and ribs.  Interval Since Last Radiation:  7 weeks   04/10/16-04/21/16: The T-spine was treated to 30 Gy in 10 fractions  Narrative:  The patient returns today for routine follow-up.  He tolerated radiotherapy well.                              On review of systems, the patient states he is doing well overall. He denies any concerns with dysphagia or esophagitis. He denies any nausea or vomiting. He has noticed some dry skin and is using vitamin E oil for his skin. He denies any chest pain or shortness of breath he is doing well on O2.  ALLERGIES:  is allergic to dihydrocodeine; hydrocodone; doxycycline; and linzess [linaclotide].  Meds: Current Outpatient Prescriptions  Medication Sig Dispense Refill  . albuterol (PROVENTIL HFA;VENTOLIN HFA) 108 (90 Base) MCG/ACT inhaler Inhale 2 puffs into the lungs every 4 (four) hours as needed for wheezing or shortness of breath. 1 Inhaler 0  . budesonide-formoterol (SYMBICORT) 160-4.5 MCG/ACT inhaler Inhale 2 puffs into the lungs 2 (two) times daily.    Marland Kitchen dronabinol (MARINOL) 5 MG capsule Take 1 capsule by mouth 2 (two) times daily.    . DULoxetine (CYMBALTA) 30 MG capsule Take 30 mg by mouth daily.    . feeding supplement (BOOST HIGH PROTEIN) LIQD Take 1 Container by mouth 2 (two) times daily between meals.    . folic acid (FOLVITE) 1 MG tablet Take 1 mg by mouth daily.    . furosemide (LASIX) 20 MG tablet Take 20-40 tablets by mouth 2 (two) times daily. 40 mg in the morning and 20 mg at bedtime.    Marland Kitchen ibuprofen (ADVIL,MOTRIN) 200 MG tablet Take 200 mg by mouth every 6 (six) hours as needed for mild pain or moderate pain. Reported  on 12/14/2015    . morphine (MS CONTIN) 15 MG 12 hr tablet Take 1 tablet (15 mg total) by mouth every 12 (twelve) hours. 60 tablet 0  . morphine (MSIR) 15 MG tablet Take 1 tablet (15 mg total) by mouth every 4 (four) hours as needed for severe pain. 90 tablet 0  . Multiple Vitamin (MULTIVITAMIN WITH MINERALS) TABS tablet Take 1 tablet by mouth daily.    Marland Kitchen nystatin (MYCOSTATIN) 100000 UNIT/ML suspension Take 5 mLs by mouth 4 (four) times daily as needed (thrush/blisters from chemo treatment.).     Marland Kitchen OCALIVA 5 MG TABS Take 1 tablet by mouth daily. (Patient taking differently: Take 5 mg by mouth daily. ) 90 tablet 3  . omeprazole (PRILOSEC) 20 MG capsule Take 1 capsule (20 mg total) by mouth daily. 30 capsule 5  . polyethylene glycol (MIRALAX / GLYCOLAX) packet Take 34 g by mouth 2 (two) times daily.     Marland Kitchen tiotropium (SPIRIVA) 18 MCG inhalation capsule Place 18 mcg into inhaler and inhale daily.    . Diphenhyd-Hydrocort-Nystatin (FIRST-DUKES MOUTHWASH) SUSP Take 15 mLs by mouth 5 (five) times daily.  0  . ondansetron (ZOFRAN) 8 MG tablet Take 1 tablet (8 mg total) by mouth every 8 (eight) hours as needed for  nausea or vomiting. (Patient not taking: Reported on 06/13/2016) 30 tablet 0  . predniSONE (DELTASONE) 10 MG tablet Take 1 tablet (10 mg total) by mouth daily with breakfast. Take 6 tablets today and then decrease by 1 tablet daily until none are left. (Patient not taking: Reported on 06/13/2016) 21 tablet 0  . prochlorperazine (COMPAZINE) 10 MG tablet Take 10 mg by mouth every 6 (six) hours as needed for nausea/vomiting.    . sucralfate (CARAFATE) 1 g tablet Take 1 tablet by mouth 4 (four) times daily.     No current facility-administered medications for this encounter.     Physical Findings:  height is '5\' 11"'$  (1.803 m) and weight is 134 lb 14.4 oz (61.2 kg). His oral temperature is 97.5 F (36.4 C). His blood pressure is 111/71 and his pulse is 88. His respiration is 18 and oxygen saturation is  95%.  In general this is a well appearing caucasian male in no acute distress. He's alert and oriented x4 and appropriate throughout the examination. Cardiopulmonary assessment is negative for acute distress and he exhibits normal effort. His posterior chest has mild hyperpigmentation without desquamation.  Lab Findings: Lab Results  Component Value Date   WBC 3.0 (L) 06/07/2016   HGB 10.7 (L) 06/07/2016   HCT 32.8 (L) 06/07/2016   MCV 87.2 06/07/2016   PLT 244 06/07/2016     Radiographic Findings: Dg Chest 2 View  Result Date: 06/07/2016 CLINICAL DATA:  Near syncope EXAM: CHEST  2 VIEW COMPARISON:  04/27/2016 chest x-ray FINDINGS: Porta catheter on the right with loose kink at the level of the clavicle crossing. Catheter tip is at the SVC level. Normal heart size and mediastinal contours. Emphysema. There is no edema, consolidation, effusion, or pneumothorax. Nodular density over the right chest wall could be pulmonary or nipple shadow based on 04/27/2016 chest CT. Patient's right thoracic mass and spinal erosion are obscured. IMPRESSION: No evidence of acute disease. Electronically Signed   By: Monte Fantasia M.D.   On: 06/07/2016 13:51   Dg Foot Complete Left  Result Date: 05/27/2016 CLINICAL DATA:  Patient with left foot redness and swelling for 1 week. EXAM: LEFT FOOT - COMPLETE 3+ VIEW COMPARISON:  None. FINDINGS: Midfoot degenerative changes. First MTP joint degenerative changes. Normal anatomic alignment. No evidence for acute fracture or dislocation posterior calcaneal spurring. Regional soft tissues are unremarkable. IMPRESSION: No acute osseous abnormality. Electronically Signed   By: Lovey Newcomer M.D.   On: 05/27/2016 13:15    Impression/Plan: 1. 60 y.o. male with stage IV, NSCLC, adenocarcinoma of the right lung with disease in the spine and ribs. Carboplatin/Pembrolizumab with [facility] medical oncology. The patient will return as needed for follow up with [place], and continue care at  [facility].  2. COPD exacerbation. The patient's breathing has improved with O2. He will continue to follow up with pulmonary as well.      Carola Rhine, PAC

## 2016-06-13 NOTE — Progress Notes (Addendum)
Jeffrey Gutierrez is here for a one month follow up visit for  T spine cancer.                       Coughing clear secretions at times,early am greenish secretions from his sinus, SOB with exertion, no blood in secretions            Denies pain today, when he has pain 1-2/10 takes Morphine Bowel/Bladder retention or incontinence (please describe):None  Numbness or weakness in extremities (please describe): None Current Decadron regimen, if applicable: Not taking Preddnisone Fatigue:None Skin status:Skin has normal color looking better still has some peeling on upper back between shoulder blades.  Using lotion with vitamin E daily.  Ambulatory status? Walker? Wheelchair?: Ambulatory without assistive device. Wt Readings from Last 3 Encounters:  06/13/16 134 lb 14.4 oz (61.2 kg)  06/07/16 134 lb (60.8 kg)  05/30/16 128 lb 14.4 oz (58.5 kg)  BP 111/71 (BP Location: Right Arm, Patient Position: Sitting, Cuff Size: Normal)   Pulse 88   Temp 97.5 F (36.4 C) (Oral)   Resp 18   Ht '5\' 11"'$  (1.803 m)   Wt 134 lb 14.4 oz (61.2 kg)   SpO2 95%   BMI 18.81 kg/m

## 2016-06-25 ENCOUNTER — Emergency Department (HOSPITAL_COMMUNITY): Payer: 59

## 2016-06-25 ENCOUNTER — Inpatient Hospital Stay (HOSPITAL_COMMUNITY)
Admission: EM | Admit: 2016-06-25 | Discharge: 2016-07-06 | DRG: 177 | Disposition: A | Discharge status: Home / Self Care | Payer: 59 | Attending: Internal Medicine | Admitting: Internal Medicine

## 2016-06-25 ENCOUNTER — Encounter (HOSPITAL_COMMUNITY): Payer: Self-pay | Admitting: Emergency Medicine

## 2016-06-25 DIAGNOSIS — D701 Agranulocytosis secondary to cancer chemotherapy: Secondary | ICD-10-CM | POA: Diagnosis present

## 2016-06-25 DIAGNOSIS — J9601 Acute respiratory failure with hypoxia: Secondary | ICD-10-CM | POA: Diagnosis present

## 2016-06-25 DIAGNOSIS — Z9981 Dependence on supplemental oxygen: Secondary | ICD-10-CM

## 2016-06-25 DIAGNOSIS — J449 Chronic obstructive pulmonary disease, unspecified: Secondary | ICD-10-CM | POA: Diagnosis present

## 2016-06-25 DIAGNOSIS — E876 Hypokalemia: Secondary | ICD-10-CM | POA: Diagnosis not present

## 2016-06-25 DIAGNOSIS — Z79891 Long term (current) use of opiate analgesic: Secondary | ICD-10-CM

## 2016-06-25 DIAGNOSIS — L8990 Pressure ulcer of unspecified site, unspecified stage: Secondary | ICD-10-CM | POA: Insufficient documentation

## 2016-06-25 DIAGNOSIS — Z801 Family history of malignant neoplasm of trachea, bronchus and lung: Secondary | ICD-10-CM

## 2016-06-25 DIAGNOSIS — C3491 Malignant neoplasm of unspecified part of right bronchus or lung: Secondary | ICD-10-CM | POA: Diagnosis present

## 2016-06-25 DIAGNOSIS — B3781 Candidal esophagitis: Secondary | ICD-10-CM | POA: Diagnosis present

## 2016-06-25 DIAGNOSIS — Z681 Body mass index (BMI) 19 or less, adult: Secondary | ICD-10-CM

## 2016-06-25 DIAGNOSIS — J690 Pneumonitis due to inhalation of food and vomit: Secondary | ICD-10-CM | POA: Diagnosis not present

## 2016-06-25 DIAGNOSIS — F17210 Nicotine dependence, cigarettes, uncomplicated: Secondary | ICD-10-CM | POA: Diagnosis present

## 2016-06-25 DIAGNOSIS — K209 Esophagitis, unspecified without bleeding: Secondary | ICD-10-CM

## 2016-06-25 DIAGNOSIS — T451X5A Adverse effect of antineoplastic and immunosuppressive drugs, initial encounter: Secondary | ICD-10-CM | POA: Diagnosis present

## 2016-06-25 DIAGNOSIS — F329 Major depressive disorder, single episode, unspecified: Secondary | ICD-10-CM | POA: Diagnosis present

## 2016-06-25 DIAGNOSIS — Z9221 Personal history of antineoplastic chemotherapy: Secondary | ICD-10-CM

## 2016-06-25 DIAGNOSIS — E43 Unspecified severe protein-calorie malnutrition: Secondary | ICD-10-CM | POA: Diagnosis present

## 2016-06-25 DIAGNOSIS — C7951 Secondary malignant neoplasm of bone: Secondary | ICD-10-CM | POA: Diagnosis present

## 2016-06-25 DIAGNOSIS — R5081 Fever presenting with conditions classified elsewhere: Secondary | ICD-10-CM | POA: Diagnosis present

## 2016-06-25 DIAGNOSIS — D709 Neutropenia, unspecified: Secondary | ICD-10-CM

## 2016-06-25 DIAGNOSIS — D61810 Antineoplastic chemotherapy induced pancytopenia: Secondary | ICD-10-CM

## 2016-06-25 DIAGNOSIS — R0902 Hypoxemia: Secondary | ICD-10-CM | POA: Diagnosis present

## 2016-06-25 DIAGNOSIS — R0602 Shortness of breath: Secondary | ICD-10-CM

## 2016-06-25 DIAGNOSIS — Z923 Personal history of irradiation: Secondary | ICD-10-CM

## 2016-06-25 DIAGNOSIS — T17908A Unspecified foreign body in respiratory tract, part unspecified causing other injury, initial encounter: Secondary | ICD-10-CM | POA: Diagnosis present

## 2016-06-25 DIAGNOSIS — R1314 Dysphagia, pharyngoesophageal phase: Secondary | ICD-10-CM

## 2016-06-25 DIAGNOSIS — K219 Gastro-esophageal reflux disease without esophagitis: Secondary | ICD-10-CM | POA: Diagnosis present

## 2016-06-25 DIAGNOSIS — I10 Essential (primary) hypertension: Secondary | ICD-10-CM | POA: Diagnosis present

## 2016-06-25 DIAGNOSIS — Z7951 Long term (current) use of inhaled steroids: Secondary | ICD-10-CM

## 2016-06-25 DIAGNOSIS — G894 Chronic pain syndrome: Secondary | ICD-10-CM | POA: Diagnosis present

## 2016-06-25 DIAGNOSIS — Z23 Encounter for immunization: Secondary | ICD-10-CM

## 2016-06-25 DIAGNOSIS — B20 Human immunodeficiency virus [HIV] disease: Secondary | ICD-10-CM | POA: Diagnosis present

## 2016-06-25 DIAGNOSIS — Z79899 Other long term (current) drug therapy: Secondary | ICD-10-CM

## 2016-06-25 LAB — CBC WITH DIFFERENTIAL/PLATELET
Basophils Absolute: 0 10*3/uL (ref 0.0–0.1)
Basophils Relative: 0 %
EOS PCT: 1 %
Eosinophils Absolute: 0 10*3/uL (ref 0.0–0.7)
HEMATOCRIT: 35.8 % — AB (ref 39.0–52.0)
Hemoglobin: 11.4 g/dL — ABNORMAL LOW (ref 13.0–17.0)
LYMPHS ABS: 0.9 10*3/uL (ref 0.7–4.0)
LYMPHS PCT: 24 %
MCH: 29.2 pg (ref 26.0–34.0)
MCHC: 31.8 g/dL (ref 30.0–36.0)
MCV: 91.8 fL (ref 78.0–100.0)
MONO ABS: 0.1 10*3/uL (ref 0.1–1.0)
Monocytes Relative: 3 %
NEUTROS ABS: 2.6 10*3/uL (ref 1.7–7.7)
Neutrophils Relative %: 72 %
PLATELETS: 89 10*3/uL — AB (ref 150–400)
RBC: 3.9 MIL/uL — AB (ref 4.22–5.81)
RDW: 17.7 % — AB (ref 11.5–15.5)
WBC: 3.6 10*3/uL — ABNORMAL LOW (ref 4.0–10.5)

## 2016-06-25 LAB — BASIC METABOLIC PANEL
Anion gap: 9 (ref 5–15)
BUN: 28 mg/dL — AB (ref 6–20)
CO2: 33 mmol/L — AB (ref 22–32)
Calcium: 9 mg/dL (ref 8.9–10.3)
Chloride: 97 mmol/L — ABNORMAL LOW (ref 101–111)
Creatinine, Ser: 1.02 mg/dL (ref 0.61–1.24)
GFR calc Af Amer: >60 mL/min (ref >60)
GLUCOSE: 105 mg/dL — AB (ref 65–99)
POTASSIUM: 4.1 mmol/L (ref 3.5–5.1)
Sodium: 139 mmol/L (ref 135–145)

## 2016-06-25 MED ORDER — IOPAMIDOL (ISOVUE-370) INJECTION 76%
100.0000 mL | Freq: Once | INTRAVENOUS | Status: AC | PRN
  Administered 2016-06-25: 100 mL via INTRAVENOUS

## 2016-06-25 MED ORDER — ALBUTEROL SULFATE (2.5 MG/3ML) 0.083% IN NEBU
5.0000 mg | INHALATION_SOLUTION | Freq: Once | RESPIRATORY_TRACT | Status: AC
  Administered 2016-06-25: 5 mg via RESPIRATORY_TRACT
  Filled 2016-06-25: qty 6

## 2016-06-25 NOTE — ED Triage Notes (Signed)
Patient complaining of shortness of breath. Patient has lung Cancer and his last treatment was Thursday. Patient has had trouble breathing for about 1 and half hours. Patient is not on oxygen at home. Patient was 79% on room air with EMS. Patient was put him on 4 L [place]. Patient had 1 duo neb. Patient is 96%  [place] 4L.

## 2016-06-25 NOTE — ED Provider Notes (Signed)
[facility] DEPT Provider Note   CSN: [id number] Arrival date & time: 06/25/16  2001     History   Chief Complaint Chief Complaint  Patient presents with  . Shortness of Breath    HPI Jeffrey Gutierrez is a 60 y.o. male.  Patient with known history of stage IV lung cancer, COPD, last chemotherapy treatment was 5 days ago -- presents with worsening shortness of breath over the past 3 days, acutely worsening tonight while on oxygen at home. Patient has been using oxygen for the past 3 days due to shortness of breath. He called 911 when symptoms became worse tonight. Patient was found with an oxygen saturation of 79% by EMS. Patient was placed on 4 L nasal cannula, given a breathing treatment, and transported to the hospital. He has had no associated shortness of breath. No fevers or URI symptoms. No chest pain, cough, abdominal pain. No lower extremity swelling or tenderness. No history of blood clots. Onset of symptoms acute. Course is constant. Nothing makes symptoms better or worse. Also complains of difficulty swallowing, no sore throat. No lip or tongue swelling. Onset tonight.       Past Medical History:  Diagnosis Date  . Bone cancer ([place])   . COPD (chronic obstructive pulmonary disease) ([place])   . Diverticulitis   . Essential hypertension   . GERD (gastroesophageal reflux disease)   . Hiatal hernia   . Hyperplastic rectal polyp   . Lung cancer ([place])   . PBC (primary biliary cirrhosis)    Diagnosed 2006  . S/P colonoscopy 2009   Pancolonic diverticula, hyperplastic rectal polyps    Patient Active Problem List   Diagnosis Date Noted  . Normocytic anemia 05/27/2016  . Leukopenia 05/27/2016  . Depression with anxiety 05/27/2016  . GERD (gastroesophageal reflux disease) 05/27/2016  . Cellulitis 05/27/2016  . COPD (chronic obstructive pulmonary disease) ([place]) 04/28/2016  . Protein-calorie malnutrition, severe 04/28/2016  . Acute respiratory failure with hypoxia ([place])  04/27/2016  . 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 Metastasis to bone ([place]) 04/01/2016  . Adenocarcinoma of right lung, stage 4 ([place]) 04/01/2016  . Loss of weight 12/22/2015  . PBC (primary biliary cirrhosis) 12/22/2015  . RUQ pain 09/27/2015  . Hepatic cirrhosis ([place]) 07/26/2015  . Hiatal hernia   . Reflux esophagitis   . Diverticulosis of colon without hemorrhage   . History of colonic polyps   . Change in bowel function 03/10/2015  . Constipation 02/04/2015  . 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 OTHER CHRONIC NONALCOHOLIC LIVER DISEASE 54/00/8676  . Abdominal pain 01/20/2009  . [place] NONSPECIFIC IMMUNOLOGICAL FINDINGS 01/20/2009  . CHOLECYSTECTOMY, HX OF 01/20/2009    Past Surgical History:  Procedure Laterality Date  . Arm Surgery Left    ulnar nerve release  . CHOLECYSTECTOMY    . COLONOSCOPY  2009   Pancolonic diverticula, hyperplastic rectal polyps  . COLONOSCOPY [name] 03/24/2015   RMR: Single colonic polyp removed as described above. Pancolonic diverticulosis. Abnormal sigmoid colon consistent with stigmata of recent diverticulitis.   Marland Kitchen ESOPHAGOGASTRODUODENOSCOPY [name] 06/28/2015   RMR: mild erosive reflux/small HH  . INGUINAL HERNIA REPAIR Right Feb 2012   Dr. Arnoldo Morale   . INGUINAL HERNIA REPAIR Left 11/12/2015   Procedure: HERNIA REPAIR INGUINAL ADULT WITH MESH;  Surgeon: Aviva Signs, MD;  Location: [facility];  Service: General;  Laterality: Left;  . INSERTION OF MESH Left 11/12/2015   Procedure: INSERTION OF MESH;  Surgeon: Aviva Signs, MD;  Location: [facility];  Service: General;  Laterality: Left;  . LIVER BIOPSY    . NASAL SEPTUM SURGERY         Home Medications    Prior to Admission medications   Medication Sig Start Date End Date Taking? Authorizing Provider  albuterol (PROVENTIL HFA;VENTOLIN HFA) 108 (90 Base) MCG/ACT inhaler Inhale 2 puffs into the lungs every 4 (four) hours as needed for wheezing or shortness of breath. 09/17/15  Yes Merrily Pew, MD  budesonide-formoterol [facility]) 160-4.5 MCG/ACT inhaler Inhale 2 puffs into the lungs 2 (two) times daily.   Yes Historical Provider, MD  DULoxetine (CYMBALTA) 30 MG capsule Take 30 mg by mouth every morning.  05/09/16  Yes Historical Provider, MD  feeding supplement (BOOST HIGH PROTEIN) LIQD Take 1 Container by mouth 2 (two) times daily between meals.   Yes Historical Provider, MD  folic acid (FOLVITE) 1 MG tablet Take 1 mg by mouth every morning.    Yes Historical Provider, MD  furosemide (LASIX) 20 MG tablet Take 20-40 tablets by mouth 2 (two) times daily. 40 mg in the morning and 20 mg at bedtime. 05/12/16  Yes Historical Provider, MD  ibuprofen (ADVIL,MOTRIN) 200 MG tablet Take 200 mg by mouth every 6 (six) hours as needed for mild pain or moderate pain. Reported on 12/14/2015   Yes Historical Provider, MD  morphine (MS CONTIN) 15 MG 12 hr tablet Take 1 tablet (15 mg total) by mouth every 12 (twelve) hours. 04/13/16  Yes Patrici Ranks, MD  morphine (MSIR) 15 MG tablet Take 1 tablet (15 mg total) by mouth every 4 (four) hours as needed for severe pain. 04/13/16  Yes Patrici Ranks, MD  Multiple Vitamin (MULTIVITAMIN WITH MINERALS) TABS tablet Take 1 tablet by mouth every morning.    Yes Historical Provider, MD  nystatin (MYCOSTATIN) 100000 UNIT/ML suspension Take 5 mLs by mouth 4 (four) times daily as needed (thrush/blisters from chemo treatment.).  05/23/16  Yes Historical Provider, MD  OCALIVA 5 MG TABS Take 1 tablet by mouth daily. Patient taking differently: Take 5 mg by mouth every morning.  03/02/16  Yes Annitta Needs, NP  omeprazole (PRILOSEC) 20 MG capsule Take 1 capsule (20 mg total) by mouth daily. Patient taking differently: Take 20 mg by mouth every morning.  01/24/16  Yes Mahala Menghini, PA-C  ondansetron (ZOFRAN) 8 MG tablet Take 1 tablet (8 mg total) by mouth every 8 (eight) hours as needed for nausea or vomiting. 04/03/16  Yes Patrici Ranks, MD  polyethylene glycol  (MIRALAX / GLYCOLAX) packet Take 34 g by mouth 2 (two) times daily.    Yes Historical Provider, MD  tiotropium (SPIRIVA) 18 MCG inhalation capsule Place 18 mcg into inhaler and inhale every morning.    Yes Historical Provider, MD  predniSONE (DELTASONE) 10 MG tablet Take 1 tablet (10 mg total) by mouth daily with breakfast. Take 6 tablets today and then decrease by 1 tablet daily until none are left. Patient not taking: Reported on 06/25/2016 04/29/16   Erline Hau, MD    Family History Family History  Problem Relation Age of Onset  . Lung cancer Mother   . Pneumonia Father   . Lung cancer Father   . Pancreatitis Father     Social History Social History  Substance Use Topics  . Smoking status: Current Some Day Smoker    Packs/day: 0.50    Years: 43.00    Types: Cigarettes  . Smokeless tobacco: Never Used  .  Alcohol use 0.0 oz/week     Comment: occassional     Allergies   Dihydrocodeine; Hydrocodone; Doxycycline; and Linzess [linaclotide]   Review of Systems Review of Systems  Constitutional: Negative for fever.  HENT: Negative for rhinorrhea and sore throat.   Eyes: Negative for redness.  Respiratory: Positive for shortness of breath. Negative for cough.   Cardiovascular: Negative for chest pain and leg swelling.  Gastrointestinal: Negative for abdominal pain, diarrhea, nausea and vomiting.  Genitourinary: Negative for dysuria.  Musculoskeletal: Negative for myalgias.  Skin: Negative for rash.  Neurological: Negative for headaches.     Physical Exam Updated Vital Signs BP 126/86 (BP Location: Left Arm)   Pulse 77   Temp 98.8 F (37.1 C) (Oral)   Resp 15   Ht '5\' 11"'$  (1.803 m)   Wt 60.8 kg   SpO2 99%   BMI 18.69 kg/m   Physical Exam  Constitutional: He appears well-developed and well-nourished.  HENT:  Head: Normocephalic and atraumatic.  Mouth/Throat: Oropharynx is clear and moist.  Eyes: Conjunctivae are normal. Right eye exhibits no  discharge. Left eye exhibits no discharge.  Neck: Normal range of motion. Neck supple.  Cardiovascular: Normal rate, regular rhythm and normal heart sounds.   Pulmonary/Chest: Effort normal and breath sounds normal. No respiratory distress. He has no wheezes. He has no rales.  Abdominal: Soft. There is no tenderness.  Neurological: He is alert.  Skin: Skin is warm and dry.  Psychiatric: He has a normal mood and affect.  Nursing note and vitals reviewed.    ED Treatments / Results  Labs (all labs ordered are listed, but only abnormal results are displayed) Labs Reviewed  CBC WITH DIFFERENTIAL/PLATELET - Abnormal; Notable for the following:       Result Value   WBC 3.6 (*)    RBC 3.90 (*)    Hemoglobin 11.4 (*)    HCT 35.8 (*)    RDW 17.7 (*)    Platelets 89 (*)    All other components within normal limits  BASIC METABOLIC PANEL - Abnormal; Notable for the following:    Chloride 97 (*)    CO2 33 (*)    Glucose, Bld 105 (*)    BUN 28 (*)    All other components within normal limits  I-STAT CG4 LACTIC ACID, ED    EKG  EKG Interpretation None       Radiology Dg Chest 2 View  Result Date: 06/25/2016 CLINICAL DATA:  Shortness of breath. EXAM: CHEST  2 VIEW COMPARISON:  06/07/2016 FINDINGS: The right-sided IJ power port is in stable position. The heart is normal in size. The mediastinal and hilar contours are normal and stable. Stable advanced emphysematous changes and pulmonary scarring. No acute overlying pulmonary process. Pleural lesions seen on the prior PET-CT are not well visualized on this study. The bony thorax is intact. IMPRESSION: Stable advanced emphysematous changes but no acute overlying pulmonary process. Electronically Signed   By: Marijo Sanes M.D.   On: 06/25/2016 20:55   Ct Angio Chest Pe W Or Wo Contrast  Result Date: 06/26/2016 CLINICAL DATA:  Acute onset of worsening shortness of breath. Known stage IV lung cancer, on chemotherapy. Initial encounter.  EXAM: CT ANGIOGRAPHY CHEST WITH CONTRAST TECHNIQUE: Multidetector CT imaging of the chest was performed using the standard protocol during bolus administration of intravenous contrast. Multiplanar CT image reconstructions and MIPs were obtained to evaluate the vascular anatomy. CONTRAST:  100 mL of Isovue 370 IV contrast COMPARISON:  Chest radiograph performed earlier today at 8:24 p.m., and CT of the chest performed 04/27/2016 FINDINGS: Cardiovascular:  There is no evidence of pulmonary embolus. The heart is unremarkable in appearance. The thoracic aorta remains normal in caliber. No significant calcific atherosclerotic disease is seen. The great vessels are grossly unremarkable. Mediastinum/Nodes: Diffuse wall thickening is noted along the esophagus, concerning for esophagitis. No mediastinal lymphadenopathy is seen. No pericardial effusion is identified. The thyroid gland is unremarkable. No axillary lymphadenopathy is appreciated. Lungs/Pleura: Note is made of near complete opacification of the bronchioles to both lower lung lobes, suspicious for aspiration. Minimal underlying bibasilar opacities are noted. Underlying bilateral emphysematous change is seen, particularly at the lung apices. No pleural effusion or pneumothorax is identified. The patient's lung cancer is slightly less well characterized than on the prior study, with wall thickening along the posterior medial aspect of the right hemithorax, and associated underlying osseous metastatic changes. Upper Abdomen: The visualized portions of the liver and spleen are grossly unremarkable. The visualized portions of the pancreas, adrenal glands and kidneys are within normal limits. Musculoskeletal: Metastatic disease is again noted at vertebral body T5, with right-sided sclerosis. The visualized musculature is unremarkable in appearance. Review of the MIP images confirms the above findings. IMPRESSION: 1. No evidence of pulmonary embolus. 2. Near complete  opacification of the bronchioles to both lower lung lobes, suspicious for significant aspiration. Minimal underlying bibasilar airspace opacities noted. 3. Underlying bilateral emphysematous change, particularly at the lung apices. 4. Diffuse wall thickening along the esophagus, concerning for esophagitis. 5. Right-sided lung cancer is slightly less well characterized than on the prior study, with diffuse wall thickening again noted along the posteromedial aspect of the right hemithorax. Underlying osseous metastatic changes again noted, most prominent at T5. Electronically Signed   By: Garald Balding M.D.   On: 06/26/2016 00:27    Procedures Procedures (including critical care time)  Medications Ordered in ED Medications  ceFEPIme (MAXIPIME) 1 g in dextrose 5 % 50 mL IVPB (1 g Intravenous New Bag/Given 06/26/16 0123)  vancomycin (VANCOCIN) IVPB 1000 mg/200 mL premix (1,000 mg Intravenous New Bag/Given 06/26/16 0123)  vancomycin (VANCOCIN) IVPB 750 mg/150 ml premix (not administered)  albuterol (PROVENTIL) (2.5 MG/3ML) 0.083% nebulizer solution 5 mg (5 mg Nebulization Given 06/25/16 2045)  iopamidol (ISOVUE-370) 76 % injection 100 mL (100 mLs Intravenous Contrast Given 06/25/16 2319)     Initial Impression / Assessment and Plan / ED Course  I have reviewed the triage vital signs and the nursing notes.  Pertinent labs & imaging results that were available during my care of the patient were reviewed by me and considered in my medical decision making (see chart for details).  Clinical Course   Vital signs reviewed and are as follows: Vitals:   06/25/16 2015  BP: 126/86  Pulse: 77  Resp: 15  Temp: 98.8 F (37.1 C)   EKG reviewed by myself. Discussed with Dr. Johnney Killian who has seen patient.   Patient was up to restroom and became very short of breath. He was hypoxic into the upper 80s on 3 L nasal cannula. Slightly increased work of breathing from before. Will need admission. Will cover with  HCAP abx given recent admission. Will admit to hospitalist service.  BP 122/86 (BP Location: Left Arm)   Pulse 99   Temp 98.8 F (37.1 C) (Oral)   Resp 19   Ht '5\' 11"'$  (1.803 m)   Wt 60.8 kg   SpO2 93%   BMI 18.69 kg/m  Final Clinical Impressions(s) / ED Diagnoses   Final diagnoses:  Shortness of breath  Aspiration pneumonia of both lower lobes, unspecified aspiration pneumonia type ([place])  Esophagitis   Admit.   New Prescriptions New Prescriptions   No medications on file      Carlisle Cater, PA-C 06/26/16 0139    Charlesetta Shanks, MD 06/28/16 [phone]

## 2016-06-25 NOTE — ED Notes (Signed)
Bed: WA08 Expected date:  Expected time:  Means of arrival:  Comments: 60yo M resp distress

## 2016-06-25 NOTE — ED Notes (Signed)
Patient went to xray 

## 2016-06-25 NOTE — ED Notes (Signed)
Patient went to CT

## 2016-06-26 ENCOUNTER — Inpatient Hospital Stay (HOSPITAL_COMMUNITY): Payer: 59

## 2016-06-26 DIAGNOSIS — Z9221 Personal history of antineoplastic chemotherapy: Secondary | ICD-10-CM | POA: Diagnosis not present

## 2016-06-26 DIAGNOSIS — B20 Human immunodeficiency virus [HIV] disease: Secondary | ICD-10-CM | POA: Diagnosis present

## 2016-06-26 DIAGNOSIS — R0602 Shortness of breath: Secondary | ICD-10-CM | POA: Diagnosis present

## 2016-06-26 DIAGNOSIS — T17908A Unspecified foreign body in respiratory tract, part unspecified causing other injury, initial encounter: Secondary | ICD-10-CM | POA: Diagnosis not present

## 2016-06-26 DIAGNOSIS — Z801 Family history of malignant neoplasm of trachea, bronchus and lung: Secondary | ICD-10-CM | POA: Diagnosis not present

## 2016-06-26 DIAGNOSIS — K209 Esophagitis, unspecified: Secondary | ICD-10-CM | POA: Diagnosis not present

## 2016-06-26 DIAGNOSIS — D701 Agranulocytosis secondary to cancer chemotherapy: Secondary | ICD-10-CM | POA: Diagnosis present

## 2016-06-26 DIAGNOSIS — C7951 Secondary malignant neoplasm of bone: Secondary | ICD-10-CM | POA: Diagnosis present

## 2016-06-26 DIAGNOSIS — B3781 Candidal esophagitis: Secondary | ICD-10-CM | POA: Diagnosis present

## 2016-06-26 DIAGNOSIS — E43 Unspecified severe protein-calorie malnutrition: Secondary | ICD-10-CM | POA: Diagnosis present

## 2016-06-26 DIAGNOSIS — R0902 Hypoxemia: Secondary | ICD-10-CM | POA: Diagnosis present

## 2016-06-26 DIAGNOSIS — E876 Hypokalemia: Secondary | ICD-10-CM | POA: Diagnosis not present

## 2016-06-26 DIAGNOSIS — Z21 Asymptomatic human immunodeficiency virus [HIV] infection status: Secondary | ICD-10-CM | POA: Diagnosis not present

## 2016-06-26 DIAGNOSIS — F17210 Nicotine dependence, cigarettes, uncomplicated: Secondary | ICD-10-CM | POA: Diagnosis present

## 2016-06-26 DIAGNOSIS — Z23 Encounter for immunization: Secondary | ICD-10-CM | POA: Diagnosis not present

## 2016-06-26 DIAGNOSIS — Z79891 Long term (current) use of opiate analgesic: Secondary | ICD-10-CM | POA: Diagnosis not present

## 2016-06-26 DIAGNOSIS — D61810 Antineoplastic chemotherapy induced pancytopenia: Secondary | ICD-10-CM | POA: Diagnosis present

## 2016-06-26 DIAGNOSIS — Z79899 Other long term (current) drug therapy: Secondary | ICD-10-CM | POA: Diagnosis not present

## 2016-06-26 DIAGNOSIS — D709 Neutropenia, unspecified: Secondary | ICD-10-CM | POA: Diagnosis not present

## 2016-06-26 DIAGNOSIS — J189 Pneumonia, unspecified organism: Secondary | ICD-10-CM | POA: Diagnosis not present

## 2016-06-26 DIAGNOSIS — K219 Gastro-esophageal reflux disease without esophagitis: Secondary | ICD-10-CM | POA: Diagnosis present

## 2016-06-26 DIAGNOSIS — Z681 Body mass index (BMI) 19 or less, adult: Secondary | ICD-10-CM | POA: Diagnosis not present

## 2016-06-26 DIAGNOSIS — J690 Pneumonitis due to inhalation of food and vomit: Secondary | ICD-10-CM | POA: Diagnosis present

## 2016-06-26 DIAGNOSIS — I10 Essential (primary) hypertension: Secondary | ICD-10-CM | POA: Diagnosis present

## 2016-06-26 DIAGNOSIS — C3492 Malignant neoplasm of unspecified part of left bronchus or lung: Secondary | ICD-10-CM | POA: Diagnosis not present

## 2016-06-26 DIAGNOSIS — F329 Major depressive disorder, single episode, unspecified: Secondary | ICD-10-CM | POA: Diagnosis present

## 2016-06-26 DIAGNOSIS — Z923 Personal history of irradiation: Secondary | ICD-10-CM | POA: Diagnosis not present

## 2016-06-26 DIAGNOSIS — Z7951 Long term (current) use of inhaled steroids: Secondary | ICD-10-CM | POA: Diagnosis not present

## 2016-06-26 DIAGNOSIS — J449 Chronic obstructive pulmonary disease, unspecified: Secondary | ICD-10-CM | POA: Diagnosis present

## 2016-06-26 DIAGNOSIS — C3491 Malignant neoplasm of unspecified part of right bronchus or lung: Secondary | ICD-10-CM | POA: Diagnosis not present

## 2016-06-26 DIAGNOSIS — T451X5A Adverse effect of antineoplastic and immunosuppressive drugs, initial encounter: Secondary | ICD-10-CM | POA: Diagnosis not present

## 2016-06-26 DIAGNOSIS — Z9981 Dependence on supplemental oxygen: Secondary | ICD-10-CM | POA: Diagnosis not present

## 2016-06-26 DIAGNOSIS — J9601 Acute respiratory failure with hypoxia: Secondary | ICD-10-CM

## 2016-06-26 LAB — MRSA PCR SCREENING: MRSA by PCR: NEGATIVE

## 2016-06-26 LAB — EXPECTORATED SPUTUM ASSESSMENT W REFEX TO RESP CULTURE

## 2016-06-26 LAB — EXPECTORATED SPUTUM ASSESSMENT W GRAM STAIN, RFLX TO RESP C

## 2016-06-26 LAB — I-STAT CG4 LACTIC ACID, ED: Lactic Acid, Venous: 0.6 mmol/L (ref 0.5–1.9)

## 2016-06-26 MED ORDER — PANTOPRAZOLE SODIUM 40 MG PO TBEC: 40.0000 mg | DELAYED_RELEASE_TABLET | Freq: Every day | ORAL | Status: DC

## 2016-06-26 MED ORDER — FOLIC ACID 1 MG PO TABS
1.0000 mg | ORAL_TABLET | Freq: Every day | ORAL | Status: DC
  Administered 2016-06-27 – 2016-07-06 (×10): 1 mg via ORAL
  Filled 2016-06-26 (×10): qty 1

## 2016-06-26 MED ORDER — NYSTATIN 100000 UNIT/ML MT SUSP
5.0000 mL | Freq: Four times a day (QID) | OROMUCOSAL | Status: DC
  Administered 2016-06-26 – 2016-07-06 (×38): 500000 [IU] via ORAL
  Filled 2016-06-26 (×38): qty 5

## 2016-06-26 MED ORDER — MORPHINE SULFATE (PF) 2 MG/ML IV SOLN
2.0000 mg | INTRAVENOUS | Status: DC | PRN
  Administered 2016-06-26 (×2): 2 mg via INTRAVENOUS
  Filled 2016-06-26 (×2): qty 1

## 2016-06-26 MED ORDER — INFLUENZA VAC SPLIT QUAD 0.5 ML IM SUSY
0.5000 mL | PREFILLED_SYRINGE | INTRAMUSCULAR | Status: AC
  Administered 2016-06-27: 0.5 mL via INTRAMUSCULAR
  Filled 2016-06-26: qty 0.5

## 2016-06-26 MED ORDER — VANCOMYCIN HCL IN DEXTROSE 1-5 GM/200ML-% IV SOLN
1000.0000 mg | INTRAVENOUS | Status: AC
  Administered 2016-06-26: 1000 mg via INTRAVENOUS
  Filled 2016-06-26: qty 200

## 2016-06-26 MED ORDER — ONDANSETRON HCL 4 MG PO TABS: 8.0000 mg | ORAL_TABLET | Freq: Three times a day (TID) | ORAL | Status: DC | PRN

## 2016-06-26 MED ORDER — SUCRALFATE 1 GM/10ML PO SUSP
1.0000 g | Freq: Three times a day (TID) | ORAL | Status: DC
  Administered 2016-06-26 – 2016-07-06 (×39): 1 g via ORAL
  Filled 2016-06-26 (×39): qty 10

## 2016-06-26 MED ORDER — BOOST PLUS PO LIQD
1.0000 | Freq: Two times a day (BID) | ORAL | Status: DC
  Administered 2016-06-27 – 2016-07-06 (×17): 237 mL via ORAL
  Filled 2016-06-26 (×24): qty 237

## 2016-06-26 MED ORDER — ORAL CARE MOUTH RINSE
15.0000 mL | Freq: Two times a day (BID) | OROMUCOSAL | Status: DC
  Administered 2016-06-26 – 2016-07-06 (×12): 15 mL via OROMUCOSAL

## 2016-06-26 MED ORDER — IBUPROFEN 200 MG PO TABS: 200.0000 mg | ORAL_TABLET | Freq: Four times a day (QID) | ORAL | Status: DC | PRN

## 2016-06-26 MED ORDER — DULOXETINE HCL 30 MG PO CPEP
30.0000 mg | ORAL_CAPSULE | Freq: Every day | ORAL | Status: DC
Start: 2016-06-26 — End: 2016-07-06
  Administered 2016-06-27 – 2016-07-06 (×10): 30 mg via ORAL
  Filled 2016-06-26 (×10): qty 1

## 2016-06-26 MED ORDER — ALBUTEROL SULFATE (2.5 MG/3ML) 0.083% IN NEBU: 2.5000 mg | INHALATION_SOLUTION | RESPIRATORY_TRACT | Status: DC | PRN

## 2016-06-26 MED ORDER — ENOXAPARIN SODIUM 40 MG/0.4ML ~~LOC~~ SOLN
40.0000 mg | SUBCUTANEOUS | Status: DC
  Administered 2016-06-26 – 2016-06-27 (×2): 40 mg via SUBCUTANEOUS
  Filled 2016-06-26 (×2): qty 0.4

## 2016-06-26 MED ORDER — FAMOTIDINE IN NACL 20-0.9 MG/50ML-% IV SOLN
20.0000 mg | Freq: Two times a day (BID) | INTRAVENOUS | Status: DC
  Administered 2016-06-26 (×2): 20 mg via INTRAVENOUS
  Filled 2016-06-26 (×2): qty 50

## 2016-06-26 MED ORDER — SODIUM CHLORIDE 0.9 % IV SOLN
INTRAVENOUS | Status: DC
  Administered 2016-06-26 – 2016-06-28 (×5): via INTRAVENOUS

## 2016-06-26 MED ORDER — MOMETASONE FURO-FORMOTEROL FUM 200-5 MCG/ACT IN AERO
2.0000 | INHALATION_SPRAY | Freq: Two times a day (BID) | RESPIRATORY_TRACT | Status: DC
  Administered 2016-06-26 – 2016-07-06 (×20): 2 via RESPIRATORY_TRACT
  Filled 2016-06-26: qty 8.8

## 2016-06-26 MED ORDER — MORPHINE SULFATE ER 15 MG PO TBCR
15.0000 mg | EXTENDED_RELEASE_TABLET | Freq: Two times a day (BID) | ORAL | Status: DC
  Administered 2016-06-26 – 2016-07-06 (×19): 15 mg via ORAL
  Filled 2016-06-26 (×22): qty 1

## 2016-06-26 MED ORDER — MORPHINE SULFATE (PF) 2 MG/ML IV SOLN
2.0000 mg | INTRAVENOUS | Status: DC | PRN
  Administered 2016-06-26 (×3): 3 mg via INTRAVENOUS
  Administered 2016-06-27 (×4): 2 mg via INTRAVENOUS
  Administered 2016-06-28 (×3): 3 mg via INTRAVENOUS
  Administered 2016-06-28 – 2016-07-04 (×39): 2 mg via INTRAVENOUS
  Filled 2016-06-26 (×5): qty 1
  Filled 2016-06-26 (×2): qty 2
  Filled 2016-06-26 (×6): qty 1
  Filled 2016-06-26: qty 2
  Filled 2016-06-26 (×3): qty 1
  Filled 2016-06-26: qty 2
  Filled 2016-06-26: qty 1
  Filled 2016-06-26: qty 2
  Filled 2016-06-26 (×3): qty 1
  Filled 2016-06-26: qty 2
  Filled 2016-06-26 (×14): qty 1
  Filled 2016-06-26: qty 2
  Filled 2016-06-26 (×11): qty 1

## 2016-06-26 MED ORDER — DEXTROSE 5 % IV SOLN
1.0000 g | Freq: Once | INTRAVENOUS | Status: AC
  Administered 2016-06-26: 1 g via INTRAVENOUS
  Filled 2016-06-26: qty 1

## 2016-06-26 MED ORDER — ADULT MULTIVITAMIN W/MINERALS CH
1.0000 | ORAL_TABLET | Freq: Every day | ORAL | Status: DC
  Administered 2016-06-27 – 2016-07-05 (×9): 1 via ORAL
  Filled 2016-06-26 (×9): qty 1

## 2016-06-26 MED ORDER — TIOTROPIUM BROMIDE MONOHYDRATE 18 MCG IN CAPS
18.0000 ug | ORAL_CAPSULE | Freq: Every day | RESPIRATORY_TRACT | Status: DC
  Administered 2016-06-26 – 2016-07-06 (×10): 18 ug via RESPIRATORY_TRACT
  Filled 2016-06-26 (×2): qty 5

## 2016-06-26 MED ORDER — POLYETHYLENE GLYCOL 3350 17 G PO PACK
34.0000 g | PACK | Freq: Two times a day (BID) | ORAL | Status: DC
  Administered 2016-06-30 – 2016-07-06 (×11): 34 g via ORAL
  Filled 2016-06-26 (×19): qty 2

## 2016-06-26 MED ORDER — SODIUM CHLORIDE 0.9 % IV SOLN
3.0000 g | Freq: Four times a day (QID) | INTRAVENOUS | Status: DC
  Administered 2016-06-26 – 2016-06-30 (×17): 3 g via INTRAVENOUS
  Filled 2016-06-26 (×17): qty 3

## 2016-06-26 MED ORDER — OBETICHOLIC ACID 5 MG PO TABS: 5.0000 mg | ORAL_TABLET | Freq: Every day | ORAL | Status: DC

## 2016-06-26 MED ORDER — ONDANSETRON HCL 4 MG/2ML IJ SOLN
4.0000 mg | Freq: Four times a day (QID) | INTRAMUSCULAR | Status: DC | PRN
  Administered 2016-06-27: 4 mg via INTRAVENOUS
  Filled 2016-06-26: qty 2

## 2016-06-26 MED ORDER — NYSTATIN 100000 UNIT/ML MT SUSP
5.0000 mL | Freq: Four times a day (QID) | OROMUCOSAL | Status: DC | PRN
  Filled 2016-06-26: qty 5

## 2016-06-26 MED ORDER — MORPHINE SULFATE 15 MG PO TABS
15.0000 mg | ORAL_TABLET | ORAL | Status: DC | PRN
Start: 2016-06-26 — End: 2016-07-06
  Administered 2016-06-28 – 2016-07-06 (×4): 15 mg via ORAL
  Filled 2016-06-26 (×7): qty 1

## 2016-06-26 MED ORDER — VANCOMYCIN HCL IN DEXTROSE 750-5 MG/150ML-% IV SOLN
750.0000 mg | Freq: Two times a day (BID) | INTRAVENOUS | Status: DC
  Administered 2016-06-26 – 2016-06-28 (×4): 750 mg via INTRAVENOUS
  Filled 2016-06-26 (×5): qty 150

## 2016-06-26 NOTE — Progress Notes (Addendum)
Pharmacy Antibiotic Note  Jeffrey Gutierrez is a 60 y.o. male admitted on 06/25/2016 with aspiration pneumonia.  PMH significant for Stage IV lung cancer, undergoing chemo; COPD.  Pharmacy has been consulted for Vancomycin & Unasyn dosing.  Cefepime 1gm IV x 1 was given in the ED.  Plan: - Vancomycin 1gm IV x 1 followed by '750mg'$  IV q12h  - Vancomycin Trough Goal: 15-20 mcg/ml - Unasyn 3gm IV q6h - Follow renal function, clinical course, culture results  Height: '5\' 11"'$  (180.3 cm) Weight: 134 lb (60.8 kg) IBW/kg (Calculated) : 75.3  Temp (24hrs), Avg:98.8 F (37.1 C), Min:98.8 F (37.1 C), Max:98.8 F (37.1 C)   Recent Labs Lab 06/25/16 2123 06/26/16 0033  WBC 3.6*  --   CREATININE 1.02  --   LATICACIDVEN  --  0.60    Estimated Creatinine Clearance: 67.9 mL/min (by C-G formula based on SCr of 1.02 mg/dL).    Allergies  Allergen Reactions  . Dihydrocodeine Anaphylaxis  . Hydrocodone Anaphylaxis and Other (See Comments)    Throat closes up  Patient states PCP gave 11/2015 and he has been tolerating it.  . Doxycycline Nausea And Vomiting  . Linzess [Linaclotide] Diarrhea    Antimicrobials this admission: 10/9 Vanc >>   10/9 Cefepime x 1 10/9 Unasyn >>    Dose adjustments this admission:    Microbiology results: 10/9 Blood culture: sent 10/9 Sputum culture: sent  Thank you for allowing pharmacy to be a part of this patient's care.  Everette Rank, PharmD 06/26/2016 12:47 AM

## 2016-06-26 NOTE — Care Management Note (Signed)
Case Management Note  Patient Details  Name: Jeffrey Gutierrez MRN: [id number] Date of Birth: 1956-06-09  Subjective/Objective:        Sepsis versus new lung infection            Action/Plan: home   Expected Discharge Date:                  Expected Discharge Plan:  Home/Self Care  In-House Referral:     Discharge planning Services     Post Acute Care Choice:    Choice offered to:     DME Arranged:    DME Agency:     HH Arranged:    HH Agency:     Status of Service:  In process, will continue to follow  If discussed at Long Length of Stay Meetings, dates discussed:    Additional Comments:Date:  June 26, 2016 Chart reviewed for concurrent status and case management needs. Will continue to follow the patient for status change: Discharge Planning: following for needs Expected discharge date: [id number] Velva Harman, BSN, [place], [place] Leeroy Cha, RN 06/26/2016, 10:01 AM

## 2016-06-26 NOTE — Progress Notes (Signed)
Initial Nutrition Assessment  DOCUMENTATION CODES:   Severe malnutrition in context of acute illness/injury, Underweight  INTERVENTION:  - Diet advancement as medically feasible. - Continue Boost Plus BID when diet advanced; each supplement provides 360 kcal and 14 grams of protein. - RD will continue to monitor for nutrition-related needs.  NUTRITION DIAGNOSIS:   Inadequate oral intake related to inability to eat as evidenced by NPO status.  GOAL:   Patient will meet greater than or equal to 90% of their needs  MONITOR:   Diet advancement, Weight trends, Labs, I & O's  REASON FOR ASSESSMENT:   Malnutrition Screening Tool  ASSESSMENT:   60 y.o. male with medical history significant of stage 4 adenocarcinoma of lung, COPD.  Pt presents to the ED with sudden onset of severe SOB.  He was resting in a chair at home and may have "dozed off" and he had sudden onset of severe SOB.  This occurred about 1.5 hrs PTA earlier this evening.  Before then he had been feeling well.  Pt seen for MST. BMI indicates underweight status. Pt has been NPO and unable to meet estimated nutrition needs. Pt drowsy during RD visit; he does engage in conversation to some extent but wife, who is at bedside, provides the majority of information.   Pt reports last chemo was on Tuesday (10/3). Since Thursday (10/5) he has been experiencing pain with swallowing which is new for him. Pt denies any nausea or abdominal pain now or since last chemo treatment. Wife states that pt's appetite and intakes are variable. A "good day" consists of 2 large meals. The last "good day" was last week when they went out for breakfast and dinner for their anniversary and pt ate very well for both of these meals. A "bad day" consists of only eating one to two small meals such as a bowl of soup, a cup of yogurt, half of a peanut butter and jelly or chicken salad sandwich.   PTA he was consuming 1-2 bottles of chocolate Boost per day.  Pt does not like Ensure and only likes chocolate flavor of Boost; he is appreciative that order for this is already in place. Will need to ask about chewing and swallowing ability once diet is advanced. Pt denies any recent changes in bowel habits.   Physical assessment deferred at this time with respect to pt's comfort as he was resting before being prepared to be taken by transport (who arrived as RD left the room). Able to visualized severe muscle wasting to upper body. Will complete physical assessment at follow-up. Pt reports that prior to starting treatment his UBW was 150 lbs. Per chart review, pt has lost 17 lbs (12% body weight) in the past 3 months which is significant for time frame.   Medications reviewed; 20 mg IV Pepcid BID, 1 mg oral folic acid/day, MEDLINE mouth rinse BID, daily multivitamin with minerals, PRN IV Zofran, 34 g Miralax BID, 1 g Carafate QID.   Labs reviewed; Cl: 97 mmol/L, BUN: 28 mg/dL. IVF: NS @ 100 mL/hr.      Diet Order:  Diet NPO time specified  Skin:  Reviewed, no issues  Last BM:  10/9  Height:   Ht Readings from Last 1 Encounters:  06/26/16 '6\' 1"'$  (1.854 m)    Weight:   Wt Readings from Last 1 Encounters:  06/26/16 125 lb 3.5 oz (56.8 kg)    Ideal Body Weight:  83.64 kg  BMI:  Body mass index is 16.52  kg/m.  Estimated Nutritional Needs:   Kcal:  1875-2100 (33-37 kcal/kg)  Protein:  80-90 grams (1.4-1.6 grams/kg)  Fluid:  >/= 1.9 L/day  EDUCATION NEEDS:   No education needs identified at this time    Jarome Matin, MS, RD, LDN Inpatient Clinical Dietitian Pager # [phone] After hours/weekend pager # [phone]

## 2016-06-26 NOTE — ED Notes (Signed)
Pain Medication was pulled for patient. Patient stated he could not swallow. Pain medication returned. Patients doctor notified to try to get some pain medication via IV.

## 2016-06-26 NOTE — Evaluation (Signed)
Clinical/Bedside Swallow Evaluation Patient Details  Name: Jeffrey Gutierrez MRN: [id number] Date of Birth: Mar 13, 1956  Today's Date: 06/26/2016 Time: SLP Start Time (ACUTE ONLY): 1228 SLP Stop Time (ACUTE ONLY): 9476 SLP Time Calculation (min) (ACUTE ONLY): 15 min  Past Medical History:  Past Medical History:  Diagnosis Date  . Bone cancer ([place])   . COPD (chronic obstructive pulmonary disease) ([place])   . Diverticulitis   . Essential hypertension   . GERD (gastroesophageal reflux disease)   . Hiatal hernia   . Hyperplastic rectal polyp   . Lung cancer ([place])   . PBC (primary biliary cirrhosis)    Diagnosed 2006  . S/P colonoscopy 2009   Pancolonic diverticula, hyperplastic rectal polyps   Past Surgical History:  Past Surgical History:  Procedure Laterality Date  . Arm Surgery Left    ulnar nerve release  . CHOLECYSTECTOMY    . COLONOSCOPY  2009   Pancolonic diverticula, hyperplastic rectal polyps  . COLONOSCOPY [name] 03/24/2015   RMR: Single colonic polyp removed as described above. Pancolonic diverticulosis. Abnormal sigmoid colon consistent with stigmata of recent diverticulitis.   Marland Kitchen ESOPHAGOGASTRODUODENOSCOPY [name] 06/28/2015   RMR: mild erosive reflux/small HH  . INGUINAL HERNIA REPAIR Right Feb 2012   Dr. Arnoldo Morale   . INGUINAL HERNIA REPAIR Left 11/12/2015   Procedure: HERNIA REPAIR INGUINAL ADULT WITH MESH;  Surgeon: Aviva Signs, MD;  Location: [facility];  Service: General;  Laterality: Left;  . INSERTION OF MESH Left 11/12/2015   Procedure: INSERTION OF MESH;  Surgeon: Aviva Signs, MD;  Location: [facility];  Service: General;  Laterality: Left;  . LIVER BIOPSY    . NASAL SEPTUM SURGERY     HPI:  Jeffrey Gutierrez a 60 y.o.malewith medical history significant of stage 4 adenocarcinoma of lung, COPD. Pt presents to the ED with sudden onset of severe SOB. CXR shows bilateral aspiration. Patient with c/o difficulty swallowing and odynophagia beginning 10/5. S/p radiation treatment  03/2016.    Assessment / Plan / Recommendation Clinical Impression  Patient presents with evidence of dysphagia characterized by odynophagia, intermittent wet vocal quality, and cough post swallow occurring primarily with larger bolus sizes. Unclear origin. Will proceed with MBS.        Diet Recommendation NPO   Medication Administration: Via alternative means    Other  Recommendations Oral Care Recommendations: Oral care QID             Prognosis Prognosis for Safe Diet Advancement: Fair      Swallow Study   General HPI: Jeffrey Gutierrez a 60 y.o.malewith medical history significant of stage 4 adenocarcinoma of lung, COPD. Pt presents to the ED with sudden onset of severe SOB. CXR shows bilateral aspiration. Patient with c/o difficulty swallowing and odynophagia beginning 10/5. S/p radiation treatment 03/2016.  Type of Study: Bedside Swallow Evaluation Previous Swallow Assessment: none Diet Prior to this Study: NPO Temperature Spikes Noted: No Respiratory Status: Nasal cannula History of Recent Intubation: No Behavior/Cognition: Alert;Pleasant mood;Cooperative Oral Cavity Assessment: Within Functional Limits Oral Care Completed by SLP: No Oral Cavity - Dentition: Adequate natural dentition Vision: Functional for self-feeding Self-Feeding Abilities: Able to feed self Patient Positioning: Upright in bed Baseline Vocal Quality: Normal Volitional Cough: Strong;Congested Volitional Swallow: Able to elicit    Oral/Motor/Sensory Function Overall Oral Motor/Sensory Function: Within functional limits   Ice Chips Ice chips: Within functional limits Presentation: Spoon   Thin Liquid Thin Liquid: Impaired Presentation: Cup;Self Fed Pharyngeal  Phase Impairments: Wet Vocal Quality;Cough -  Immediate;Cough - Delayed (grimacing with swallow)    Nectar Thick Nectar Thick Liquid: Not tested   Honey Thick Honey Thick Liquid: Not tested   Puree Puree: Within functional  limits Presentation: Spoon;Self Fed   Solid   GO  Stephie Xu MA, CCC-SLP [phone]  Solid: Not tested (patient declined)        Eron Goble Meryl 06/26/2016,12:55 PM

## 2016-06-26 NOTE — Progress Notes (Signed)
Patient seen and examined. Admitted after midnight secondary to acute resp failure with hypoxia and concerns for aspiration Vs HCAP. Has underlying hx of COPD and also stage 4 lung cancer follow at [facility] and actively receiving chemotherapy. Patient is calm and in no distress currently. Complaining of odynophagia and with productive cough. No fever and good O2 sat on 4L. Please refer to H&P written by Dr. Alcario Drought for further info/details on admission.  Plan: -will transition PO meds to IV medications if possible -will start carafate and nystatin (swift and swallow); high concerns for candida esophagitis  -will continue antibiotics (covering Asp and HCAP) and continue nebulizer/oxygen supplementation -will continue supportive care and follow culture data   Barton Dubois [phone]

## 2016-06-26 NOTE — Progress Notes (Signed)
MBSS complete. Full report located under chart review in imaging section.  Carlis Burnsworth MA, CCC-SLP [phone]   

## 2016-06-26 NOTE — H&P (Signed)
History and Physical    Jeffrey Gutierrez:[id number] DOB: 28-Feb-1956 DOA: 06/25/2016   PCP: Purvis Kilts, MD Chief Complaint:  Chief Complaint  Patient presents with  . Shortness of Breath    HPI: Jeffrey Gutierrez is a 60 y.o. male with medical history significant of stage 4 adenocarcinoma of lung, COPD.  Pt presents to the ED with sudden onset of severe SOB.  He was resting in a chair at home and may have "dozed off" and he had sudden onset of severe SOB.  This occurred about 1.5 hrs PTA earlier this evening.  Before then he had been feeling well.  No associated cough, no CP.  ED Course: CT chest shows BLL aspiration, as well as unchanged tumor.  Review of Systems: As per HPI otherwise 10 point review of systems negative.    Past Medical History:  Diagnosis Date  . Bone cancer ([place])   . COPD (chronic obstructive pulmonary disease) ([place])   . Diverticulitis   . Essential hypertension   . GERD (gastroesophageal reflux disease)   . Hiatal hernia   . Hyperplastic rectal polyp   . Lung cancer ([place])   . PBC (primary biliary cirrhosis)    Diagnosed 2006  . S/P colonoscopy 2009   Pancolonic diverticula, hyperplastic rectal polyps    Past Surgical History:  Procedure Laterality Date  . Arm Surgery Left    ulnar nerve release  . CHOLECYSTECTOMY    . COLONOSCOPY  2009   Pancolonic diverticula, hyperplastic rectal polyps  . COLONOSCOPY [name] 03/24/2015   RMR: Single colonic polyp removed as described above. Pancolonic diverticulosis. Abnormal sigmoid colon consistent with stigmata of recent diverticulitis.   Marland Kitchen ESOPHAGOGASTRODUODENOSCOPY [name] 06/28/2015   RMR: mild erosive reflux/small HH  . INGUINAL HERNIA REPAIR Right Feb 2012   Dr. Arnoldo Morale   . INGUINAL HERNIA REPAIR Left 11/12/2015   Procedure: HERNIA REPAIR INGUINAL ADULT WITH MESH;  Surgeon: Aviva Signs, MD;  Location: [facility];  Service: General;  Laterality: Left;  . INSERTION OF MESH Left 11/12/2015   Procedure: INSERTION  OF MESH;  Surgeon: Aviva Signs, MD;  Location: [facility];  Service: General;  Laterality: Left;  . LIVER BIOPSY    . NASAL SEPTUM SURGERY       reports that he has been smoking Cigarettes.  He has a 21.50 pack-year smoking history. He has never used smokeless tobacco. He reports that he drinks alcohol. He reports that he does not use drugs.  Allergies  Allergen Reactions  . Dihydrocodeine Anaphylaxis  . Hydrocodone Anaphylaxis and Other (See Comments)    Throat closes up  Patient states PCP gave 11/2015 and he has been tolerating it.  . Doxycycline Nausea And Vomiting  . Linzess [Linaclotide] Diarrhea    Family History  Problem Relation Age of Onset  . Lung cancer Mother   . Pneumonia Father   . Lung cancer Father   . Pancreatitis Father       Prior to Admission medications   Medication Sig Start Date End Date Taking? Authorizing Provider  albuterol (PROVENTIL HFA;VENTOLIN HFA) 108 (90 Base) MCG/ACT inhaler Inhale 2 puffs into the lungs every 4 (four) hours as needed for wheezing or shortness of breath. 09/17/15  Yes Merrily Pew, MD  budesonide-formoterol [facility]) 160-4.5 MCG/ACT inhaler Inhale 2 puffs into the lungs 2 (two) times daily.   Yes Historical Provider, MD  DULoxetine (CYMBALTA) 30 MG capsule Take 30 mg by mouth every morning.  05/09/16  Yes Historical Provider, MD  feeding supplement (BOOST HIGH PROTEIN) LIQD Take 1 Container by mouth 2 (two) times daily between meals.   Yes Historical Provider, MD  folic acid (FOLVITE) 1 MG tablet Take 1 mg by mouth every morning.    Yes Historical Provider, MD  furosemide (LASIX) 20 MG tablet Take 20-40 tablets by mouth 2 (two) times daily. 40 mg in the morning and 20 mg at bedtime. 05/12/16  Yes Historical Provider, MD  ibuprofen (ADVIL,MOTRIN) 200 MG tablet Take 200 mg by mouth every 6 (six) hours as needed for mild pain or moderate pain. Reported on 12/14/2015   Yes Historical Provider, MD  morphine (MS CONTIN) 15 MG 12 hr tablet  Take 1 tablet (15 mg total) by mouth every 12 (twelve) hours. 04/13/16  Yes Patrici Ranks, MD  morphine (MSIR) 15 MG tablet Take 1 tablet (15 mg total) by mouth every 4 (four) hours as needed for severe pain. 04/13/16  Yes Patrici Ranks, MD  Multiple Vitamin (MULTIVITAMIN WITH MINERALS) TABS tablet Take 1 tablet by mouth every morning.    Yes Historical Provider, MD  nystatin (MYCOSTATIN) 100000 UNIT/ML suspension Take 5 mLs by mouth 4 (four) times daily as needed (thrush/blisters from chemo treatment.).  05/23/16  Yes Historical Provider, MD  OCALIVA 5 MG TABS Take 1 tablet by mouth daily. Patient taking differently: Take 5 mg by mouth every morning.  03/02/16  Yes Annitta Needs, NP  omeprazole (PRILOSEC) 20 MG capsule Take 1 capsule (20 mg total) by mouth daily. Patient taking differently: Take 20 mg by mouth every morning.  01/24/16  Yes Mahala Menghini, PA-C  ondansetron (ZOFRAN) 8 MG tablet Take 1 tablet (8 mg total) by mouth every 8 (eight) hours as needed for nausea or vomiting. 04/03/16  Yes Patrici Ranks, MD  polyethylene glycol (MIRALAX / GLYCOLAX) packet Take 34 g by mouth 2 (two) times daily.    Yes Historical Provider, MD  tiotropium (SPIRIVA) 18 MCG inhalation capsule Place 18 mcg into inhaler and inhale every morning.    Yes Historical Provider, MD    Physical Exam: Vitals:   06/25/16 2130 06/25/16 2200 06/25/16 2300 06/26/16 0123  BP: 131/80 140/73 134/75 122/86  Pulse: 104 (!) 40 102 99  Resp: '18 19 19 19  '$ Temp:    98.8 F (37.1 C)  TempSrc:    Oral  SpO2: 93% (!) 81% 90% 93%  Weight:      Height:          Constitutional: NAD, calm, comfortable Eyes: PERRL, lids and conjunctivae normal ENMT: Mucous membranes are moist. Posterior pharynx clear of any exudate or lesions.Normal dentition.  Neck: normal, supple, no masses, no thyromegaly Respiratory: clear to auscultation bilaterally, no wheezing, no crackles. Normal respiratory effort. No accessory muscle use.    Cardiovascular: Regular rate and rhythm, no murmurs / rubs / gallops. No extremity edema. 2+ pedal pulses. No carotid bruits.  Abdomen: no tenderness, no masses palpated. No hepatosplenomegaly. Bowel sounds positive.  Musculoskeletal: no clubbing / cyanosis. No joint deformity upper and lower extremities. Good ROM, no contractures. Normal muscle tone.  Skin: no rashes, lesions, ulcers. No induration Neurologic: CN 2-12 grossly intact. Sensation intact, DTR normal. Strength 5/5 in all 4.  Psychiatric: Normal judgment and insight. Alert and oriented x 3. Normal mood.    Labs on Admission: I have personally reviewed following labs and imaging studies  CBC:  Recent Labs Lab 06/25/16 2123  WBC 3.6*  NEUTROABS 2.6  HGB 11.4*  HCT 35.8*  MCV 91.8  PLT 89*   Basic Metabolic Panel:  Recent Labs Lab 06/25/16 2123  NA 139  K 4.1  CL 97*  CO2 33*  GLUCOSE 105*  BUN 28*  CREATININE 1.02  CALCIUM 9.0   GFR: Estimated Creatinine Clearance: 67.9 mL/min (by C-G formula based on SCr of 1.02 mg/dL). Liver Function Tests: No results for input(s): AST, ALT, ALKPHOS, BILITOT, PROT, ALBUMIN in the last 168 hours. No results for input(s): LIPASE, AMYLASE in the last 168 hours. No results for input(s): AMMONIA in the last 168 hours. Coagulation Profile: No results for input(s): INR, PROTIME in the last 168 hours. Cardiac Enzymes: No results for input(s): CKTOTAL, CKMB, CKMBINDEX, TROPONINI in the last 168 hours. BNP (last 3 results) No results for input(s): PROBNP in the last 8760 hours. HbA1C: No results for input(s): HGBA1C in the last 72 hours. CBG: No results for input(s): GLUCAP in the last 168 hours. Lipid Profile: No results for input(s): CHOL, HDL, LDLCALC, TRIG, CHOLHDL, LDLDIRECT in the last 72 hours. Thyroid Function Tests: No results for input(s): TSH, T4TOTAL, FREET4, T3FREE, THYROIDAB in the last 72 hours. Anemia Panel: No results for input(s): VITAMINB12, FOLATE,  FERRITIN, TIBC, IRON, RETICCTPCT in the last 72 hours. Urine analysis:    Component Value Date/Time   COLORURINE AMBER (A) 04/27/2016 2344   APPEARANCEUR HAZY (A) 04/27/2016 2344   LABSPEC 1.010 04/27/2016 2344   PHURINE 7.0 04/27/2016 2344   GLUCOSEU NEGATIVE 04/27/2016 2344   HGBUR NEGATIVE 04/27/2016 2344   BILIRUBINUR NEGATIVE 04/27/2016 2344   KETONESUR NEGATIVE 04/27/2016 2344   PROTEINUR NEGATIVE 04/27/2016 2344   NITRITE NEGATIVE 04/27/2016 2344   LEUKOCYTESUR NEGATIVE 04/27/2016 2344   Sepsis Labs: '@LABRCNTIP'$ (procalcitonin:4,lacticidven:4) )No results found for this or any previous visit (from the past 240 hour(s)).   Radiological Exams on Admission: Dg Chest 2 View  Result Date: 06/25/2016 CLINICAL DATA:  Shortness of breath. EXAM: CHEST  2 VIEW COMPARISON:  06/07/2016 FINDINGS: The right-sided IJ power port is in stable position. The heart is normal in size. The mediastinal and hilar contours are normal and stable. Stable advanced emphysematous changes and pulmonary scarring. No acute overlying pulmonary process. Pleural lesions seen on the prior PET-CT are not well visualized on this study. The bony thorax is intact. IMPRESSION: Stable advanced emphysematous changes but no acute overlying pulmonary process. Electronically Signed   By: Marijo Sanes M.D.   On: 06/25/2016 20:55   Ct Angio Chest Pe W Or Wo Contrast  Result Date: 06/26/2016 CLINICAL DATA:  Acute onset of worsening shortness of breath. Known stage IV lung cancer, on chemotherapy. Initial encounter. EXAM: CT ANGIOGRAPHY CHEST WITH CONTRAST TECHNIQUE: Multidetector CT imaging of the chest was performed using the standard protocol during bolus administration of intravenous contrast. Multiplanar CT image reconstructions and MIPs were obtained to evaluate the vascular anatomy. CONTRAST:  100 mL of Isovue 370 IV contrast COMPARISON:  Chest radiograph performed earlier today at 8:24 p.m., and CT of the chest performed  04/27/2016 FINDINGS: Cardiovascular:  There is no evidence of pulmonary embolus. The heart is unremarkable in appearance. The thoracic aorta remains normal in caliber. No significant calcific atherosclerotic disease is seen. The great vessels are grossly unremarkable. Mediastinum/Nodes: Diffuse wall thickening is noted along the esophagus, concerning for esophagitis. No mediastinal lymphadenopathy is seen. No pericardial effusion is identified. The thyroid gland is unremarkable. No axillary lymphadenopathy is appreciated. Lungs/Pleura: Note is made of near complete opacification of the bronchioles to both lower lung  lobes, suspicious for aspiration. Minimal underlying bibasilar opacities are noted. Underlying bilateral emphysematous change is seen, particularly at the lung apices. No pleural effusion or pneumothorax is identified. The patient's lung cancer is slightly less well characterized than on the prior study, with wall thickening along the posterior medial aspect of the right hemithorax, and associated underlying osseous metastatic changes. Upper Abdomen: The visualized portions of the liver and spleen are grossly unremarkable. The visualized portions of the pancreas, adrenal glands and kidneys are within normal limits. Musculoskeletal: Metastatic disease is again noted at vertebral body T5, with right-sided sclerosis. The visualized musculature is unremarkable in appearance. Review of the MIP images confirms the above findings. IMPRESSION: 1. No evidence of pulmonary embolus. 2. Near complete opacification of the bronchioles to both lower lung lobes, suspicious for significant aspiration. Minimal underlying bibasilar airspace opacities noted. 3. Underlying bilateral emphysematous change, particularly at the lung apices. 4. Diffuse wall thickening along the esophagus, concerning for esophagitis. 5. Right-sided lung cancer is slightly less well characterized than on the prior study, with diffuse wall  thickening again noted along the posteromedial aspect of the right hemithorax. Underlying osseous metastatic changes again noted, most prominent at T5. Electronically Signed   By: Garald Balding M.D.   On: 06/26/2016 00:27    EKG: Independently reviewed.  Assessment/Plan Principal Problem:   Aspiration into airway Active Problems:   Adenocarcinoma of right lung, stage 4 (HCC)   Acute respiratory failure with hypoxia (HCC)   Hypoxia    1. Aspiration - causing hypoxia 1. Will treat as aspiration PNA 2. o2 monitor 3. Tele monitor 4. unasyn and vanc empirically 5. SLP eval and treat in AM 2. Adenocarcinoma of lung -  1. Looks like [facility] oncology is seeing him 2. Looks like he just got chemo on the 3rd with carboplatin and alimta 3. Keep eye on CBC daily to monitor WBC for neutropenia   DVT prophylaxis: Lovenox Code Status: Full Family Communication: Wife at bedside Consults called: None Admission status: Admit to inpatient   Etta Quill DO [facility] Pager [phone] from 7PM-7AM  If 7AM-7PM, please contact the day physician for the patient [email or web address] Password [email or web address]  06/26/2016, 2:00 AM

## 2016-06-27 ENCOUNTER — Other Ambulatory Visit: Payer: Self-pay | Admitting: Gastroenterology

## 2016-06-27 DIAGNOSIS — R0602 Shortness of breath: Secondary | ICD-10-CM

## 2016-06-27 DIAGNOSIS — E43 Unspecified severe protein-calorie malnutrition: Secondary | ICD-10-CM

## 2016-06-27 LAB — BASIC METABOLIC PANEL
ANION GAP: 5 (ref 5–15)
BUN: 14 mg/dL (ref 6–20)
CALCIUM: 8.2 mg/dL — AB (ref 8.9–10.3)
CO2: 31 mmol/L (ref 22–32)
Chloride: 103 mmol/L (ref 101–111)
Creatinine, Ser: 0.77 mg/dL (ref 0.61–1.24)
GFR calc non Af Amer: >60 mL/min (ref >60)
Glucose, Bld: 112 mg/dL — ABNORMAL HIGH (ref 65–99)
POTASSIUM: 3.5 mmol/L (ref 3.5–5.1)
Sodium: 139 mmol/L (ref 135–145)

## 2016-06-27 LAB — CBC WITH DIFFERENTIAL/PLATELET
BASOS ABS: 0 10*3/uL (ref 0.0–0.1)
Basophils Relative: 1 %
Eosinophils Absolute: 0 10*3/uL (ref 0.0–0.7)
Eosinophils Relative: 4 %
HEMATOCRIT: 32 % — AB (ref 39.0–52.0)
HEMOGLOBIN: 10.1 g/dL — AB (ref 13.0–17.0)
LYMPHS ABS: 0.3 10*3/uL — AB (ref 0.7–4.0)
LYMPHS PCT: 22 %
MCH: 29 pg (ref 26.0–34.0)
MCHC: 31.6 g/dL (ref 30.0–36.0)
MCV: 92 fL (ref 78.0–100.0)
MONOS PCT: 1 %
Monocytes Absolute: 0 10*3/uL — ABNORMAL LOW (ref 0.1–1.0)
NEUTROS PCT: 72 %
Neutro Abs: 0.9 10*3/uL — ABNORMAL LOW (ref 1.7–7.7)
Platelets: 70 10*3/uL — ABNORMAL LOW (ref 150–400)
RBC: 3.48 MIL/uL — AB (ref 4.22–5.81)
RDW: 17.3 % — AB (ref 11.5–15.5)
WBC: 1.2 10*3/uL — AB (ref 4.0–10.5)

## 2016-06-27 LAB — GLUCOSE, CAPILLARY: Glucose-Capillary: 123 mg/dL — ABNORMAL HIGH (ref 65–99)

## 2016-06-27 LAB — HIV 1/2 AB DIFFERENTIATION
HIV 1 AB: POSITIVE — AB
HIV 2 AB: NEGATIVE

## 2016-06-27 LAB — STREP PNEUMONIAE URINARY ANTIGEN: Strep Pneumo Urinary Antigen: NEGATIVE

## 2016-06-27 LAB — MAGNESIUM: Magnesium: 1.7 mg/dL (ref 1.7–2.4)

## 2016-06-27 LAB — HIV ANTIBODY (ROUTINE TESTING W REFLEX)

## 2016-06-27 MED ORDER — FAMOTIDINE 20 MG PO TABS
40.0000 mg | ORAL_TABLET | Freq: Every day | ORAL | Status: DC
  Administered 2016-06-27 – 2016-07-05 (×9): 40 mg via ORAL
  Filled 2016-06-27 (×9): qty 2

## 2016-06-27 MED ORDER — PANTOPRAZOLE SODIUM 40 MG PO TBEC
40.0000 mg | DELAYED_RELEASE_TABLET | Freq: Every day | ORAL | Status: DC
  Administered 2016-06-27 – 2016-07-06 (×10): 40 mg via ORAL
  Filled 2016-06-27 (×10): qty 1

## 2016-06-27 MED ORDER — SODIUM CHLORIDE 0.9% FLUSH
10.0000 mL | INTRAVENOUS | Status: DC | PRN
  Administered 2016-06-29 – 2016-07-04 (×6): 10 mL
  Filled 2016-06-27 (×6): qty 40

## 2016-06-27 MED ORDER — FLUCONAZOLE IN SODIUM CHLORIDE 100-0.9 MG/50ML-% IV SOLN
100.0000 mg | INTRAVENOUS | Status: DC
  Administered 2016-06-27 – 2016-06-28 (×2): 100 mg via INTRAVENOUS
  Filled 2016-06-27 (×2): qty 50

## 2016-06-27 NOTE — Progress Notes (Signed)
[facility] PROGRESS NOTE  Jeffrey Gutierrez YOV:[id number] DOB: [birth year] DOA: 06/25/2016 PCP: Purvis Kilts, MD  Interim summary and HPI Patient seen and examined. Admitted after midnight secondary to acute resp failure with hypoxia and concerns for aspiration Vs HCAP. Has underlying hx of COPD and also stage 4 lung cancer follow at [facility] and actively receiving chemotherapy. Patient responding to treatment. Complaining mainly of odynophagia currently.  Assessment/Plan: 1-acute resp failure with hypoxia: due to aspiration PNA vs HCAP -will continue weaning oxygen supplementation as tolerated -continue current antibiotics and follow culture data -continue PRN nebulizer, incentive spirometry and supportive care  2-stage 4 adenocarcinoma of the lung -continue outpatient follow with oncology service -patient actively receiving chemotherapy  3-protein calorie malnutrition: severe -will use feeding supplements -nutritional service consulted, will follow rec's  4-pancytopenia due to antineoplastic agents -will monitor trend -no signs of acute bleeding  -Hgb stable  5-COPD and Tobacco abuse -no wheezing currently -will continue home nebulizer/inhaler regimen -patient counseled to stop smoking  6-GERD: Will continue PPI -started on Pepcid QHS and carafate for better control  7-presumed esophagitis: most likely candida infection -will use carafate for symptoms management -continue nystatin and start diflucan as recommended by GI  8-depression: will continue cymbalta  9-chronic pain syndrome: -will continue home analgesic regimen   Code Status: Full Family Communication: no family at bedside Disposition Plan: will transfer out of stepdown; continue antibiotics for PNA, empiric treatment for esophagitis (presumed to be due to candida infection).   Consultants:  Speech therapy   GI (curbside; recommended to treat empirically for candida esophagitis with diflucan and  nystatin); if failed to improved will need EGD.  Procedures:  See below for x-ray reports   MBS  Antibiotics:  Vancomycin 06/26/16  Unasyn  06/26/16  HPI/Subjective: Afebrile, breathing easier, even still requiring oxygen supplementation and experiencing intermittent coughing spells. Patient also endorses odynophagia   Objective: Vitals:   06/27/16 0742 06/27/16 0800  BP:    Pulse:    Resp: 16   Temp:  98.9 F (37.2 C)    Intake/Output Summary (Last 24 hours) at 06/27/16 0813 Last data filed at 06/27/16 0700  Gross per 24 hour  Intake          3073.75 ml  Output              425 ml  Net          2648.75 ml   Filed Weights   06/25/16 2016 06/26/16 0400  Weight: 60.8 kg (134 lb) 56.8 kg (125 lb 3.5 oz)    Exam:   General:  Frail, underweight and in no acute distress. Afebrile, breathing easier, but still requiring 3L  supplementation; complaining of odynophagia, no nausea, no vomiting. Reports some intermittent cough   Cardiovascular: S1 and S2, no rubs, no gallops, no murmurs   Respiratory: No wheezing, improved air movement , positive rhonchi   Abdomen: soft, NT, ND, positive BS  Musculoskeletal: No cyanosis, No clubbing, no edema..  Data Reviewed: Basic Metabolic Panel:  Recent Labs Lab 06/25/16 2123 06/27/16 0312  NA 139 139  K 4.1 3.5  CL 97* 103  CO2 33* 31  GLUCOSE 105* 112*  BUN 28* 14  CREATININE 1.02 0.77  CALCIUM 9.0 8.2*  MG  --  1.7   CBC:  Recent Labs Lab 06/25/16 2123 06/27/16 0312  WBC 3.6* 1.2*  NEUTROABS 2.6 0.9*  HGB 11.4* 10.1*  HCT 35.8* 32.0*  MCV 91.8 92.0  PLT 89* 70*  BNP (last 3 results)  Recent Labs  04/27/16 2020  BNP 67.0   CBG: No results for input(s): GLUCAP in the last 168 hours.  Recent Results (from the past 240 hour(s))  MRSA PCR Screening     Status: None   Collection Time: 06/26/16  3:44 AM  Result Value Ref Range Status   MRSA by PCR NEGATIVE NEGATIVE Final    Comment:        The  GeneXpert MRSA Assay (FDA approved for NASAL specimens only), is one component of a comprehensive MRSA colonization surveillance program. It is not intended to diagnose MRSA infection nor to guide or monitor treatment for MRSA infections.   Culture, sputum-assessment     Status: None   Collection Time: 06/26/16  2:34 PM  Result Value Ref Range Status   Specimen Description SPUTUM  Final   Special Requests NONE  Final   Sputum evaluation   Final    THIS SPECIMEN IS ACCEPTABLE. RESPIRATORY CULTURE REPORT TO FOLLOW.   Report Status 06/26/2016 FINAL  Final  Culture, respiratory (NON-Expectorated)     Status: None (Preliminary result)   Collection Time: 06/26/16  2:34 PM  Result Value Ref Range Status   Specimen Description SPUTUM  Final   Special Requests NONE  Final   Gram Stain   Final    MODERATE WBC PRESENT, PREDOMINANTLY PMN FEW GRAM POSITIVE COCCI IN PAIRS FEW GRAM NEGATIVE RODS FEW GRAM POSITIVE RODS Performed at [facility]    Culture PENDING  Incomplete   Report Status PENDING  Incomplete     Studies: Dg Chest 2 View  Result Date: 06/25/2016 CLINICAL DATA:  Shortness of breath. EXAM: CHEST  2 VIEW COMPARISON:  06/07/2016 FINDINGS: The right-sided IJ power port is in stable position. The heart is normal in size. The mediastinal and hilar contours are normal and stable. Stable advanced emphysematous changes and pulmonary scarring. No acute overlying pulmonary process. Pleural lesions seen on the prior PET-CT are not well visualized on this study. The bony thorax is intact. IMPRESSION: Stable advanced emphysematous changes but no acute overlying pulmonary process. Electronically Signed   By: Marijo Sanes M.D.   On: 06/25/2016 20:55   Ct Angio Chest Pe W Or Wo Contrast  Result Date: 06/26/2016 CLINICAL DATA:  Acute onset of worsening shortness of breath. Known stage IV lung cancer, on chemotherapy. Initial encounter. EXAM: CT ANGIOGRAPHY CHEST WITH CONTRAST  TECHNIQUE: Multidetector CT imaging of the chest was performed using the standard protocol during bolus administration of intravenous contrast. Multiplanar CT image reconstructions and MIPs were obtained to evaluate the vascular anatomy. CONTRAST:  100 mL of Isovue 370 IV contrast COMPARISON:  Chest radiograph performed earlier today at 8:24 p.m., and CT of the chest performed 04/27/2016 FINDINGS: Cardiovascular:  There is no evidence of pulmonary embolus. The heart is unremarkable in appearance. The thoracic aorta remains normal in caliber. No significant calcific atherosclerotic disease is seen. The great vessels are grossly unremarkable. Mediastinum/Nodes: Diffuse wall thickening is noted along the esophagus, concerning for esophagitis. No mediastinal lymphadenopathy is seen. No pericardial effusion is identified. The thyroid gland is unremarkable. No axillary lymphadenopathy is appreciated. Lungs/Pleura: Note is made of near complete opacification of the bronchioles to both lower lung lobes, suspicious for aspiration. Minimal underlying bibasilar opacities are noted. Underlying bilateral emphysematous change is seen, particularly at the lung apices. No pleural effusion or pneumothorax is identified. The patient's lung cancer is slightly less well characterized than on the prior study, with  wall thickening along the posterior medial aspect of the right hemithorax, and associated underlying osseous metastatic changes. Upper Abdomen: The visualized portions of the liver and spleen are grossly unremarkable. The visualized portions of the pancreas, adrenal glands and kidneys are within normal limits. Musculoskeletal: Metastatic disease is again noted at vertebral body T5, with right-sided sclerosis. The visualized musculature is unremarkable in appearance. Review of the MIP images confirms the above findings. IMPRESSION: 1. No evidence of pulmonary embolus. 2. Near complete opacification of the bronchioles to both  lower lung lobes, suspicious for significant aspiration. Minimal underlying bibasilar airspace opacities noted. 3. Underlying bilateral emphysematous change, particularly at the lung apices. 4. Diffuse wall thickening along the esophagus, concerning for esophagitis. 5. Right-sided lung cancer is slightly less well characterized than on the prior study, with diffuse wall thickening again noted along the posteromedial aspect of the right hemithorax. Underlying osseous metastatic changes again noted, most prominent at T5. Electronically Signed   By: Garald Balding M.D.   On: 06/26/2016 00:27   Dg Swallowing Func-speech Pathology  Result Date: 06/26/2016 Objective Swallowing Evaluation: Type of Study: MBS-Modified Barium Swallow Study Patient Details Name: GLENMORE KARL MRN: [id number] Date of Birth: 12/29/1955 Today's Date: 06/26/2016 Time: SLP Start Time (ACUTE ONLY): 1420-SLP Stop Time (ACUTE ONLY): 1440 SLP Time Calculation (min) (ACUTE ONLY): 20 min Past Medical History: Past Medical History: Diagnosis Date . Bone cancer ([place])  . COPD (chronic obstructive pulmonary disease) ([place])  . Diverticulitis  . Essential hypertension  . GERD (gastroesophageal reflux disease)  . Hiatal hernia  . Hyperplastic rectal polyp  . Lung cancer ([place])  . PBC (primary biliary cirrhosis)   Diagnosed 2006 . S/P colonoscopy 2009  Pancolonic diverticula, hyperplastic rectal polyps Past Surgical History: Past Surgical History: Procedure Laterality Date . Arm Surgery Left   ulnar nerve release . CHOLECYSTECTOMY   . COLONOSCOPY  2009  Pancolonic diverticula, hyperplastic rectal polyps . COLONOSCOPY [name] 03/24/2015  RMR: Single colonic polyp removed as described above. Pancolonic diverticulosis. Abnormal sigmoid colon consistent with stigmata of recent diverticulitis.  Marland Kitchen ESOPHAGOGASTRODUODENOSCOPY [name] 06/28/2015  RMR: mild erosive reflux/small HH . INGUINAL HERNIA REPAIR Right Feb 2012  Dr. Arnoldo Morale  . INGUINAL HERNIA REPAIR Left 11/12/2015   Procedure: HERNIA REPAIR INGUINAL ADULT WITH MESH;  Surgeon: Aviva Signs, MD;  Location: [facility];  Service: General;  Laterality: Left; . INSERTION OF MESH Left 11/12/2015  Procedure: INSERTION OF MESH;  Surgeon: Aviva Signs, MD;  Location: [facility];  Service: General;  Laterality: Left; . LIVER BIOPSY   . NASAL SEPTUM SURGERY   HPI: JAKIE DEBOW a 60 y.o.malewith medical history significant of stage 4 adenocarcinoma of lung, COPD. Pt presents to the ED with sudden onset of severe SOB. CXR shows bilateral aspiration. Patient with c/o difficulty swallowing and odynophagia beginning 10/5. S/p radiation treatment 03/2016.  No Data Recorded Assessment / Plan / Recommendation CHL IP CLINICAL IMPRESSIONS 06/26/2016 Therapy Diagnosis Mild pharyngeal phase dysphagia;Moderate pharyngeal phase dysphagia Clinical Impression Patient presents with a moderate pharyngeal phase dysphagia characterized by retention of barium in vallecula (moderate) and above CP (mild ) post swallow as well as mildly decreased laryngeal closure resulting in trace penetration during the swallow with thin liquids. Use of chin tuck assists to fully clear the pharynx of bolus as well as prevent penetration/aspiration. Unclear cause of deficits at this time as hyolaryngeal elevation and pharyngeal contraction all appear WFL and oral motor exam is unrevealing. Question presence of pharyngo-esophageal candida as patient with c/o  painful swallow. Note MD is initiating meds for possible candida. Will f/u at bedside for improvement and need for further testing depending on progress/changes in function.  Impact on safety and function Moderate aspiration risk;Mild aspiration risk   CHL IP TREATMENT RECOMMENDATION 06/26/2016 Treatment Recommendations Therapy as outlined in treatment plan below   Prognosis 06/26/2016 Prognosis for Safe Diet Advancement Fair Barriers to Reach Goals -- Barriers/Prognosis Comment -- CHL IP DIET RECOMMENDATION 06/26/2016 SLP Diet  Recommendations Regular solids;Thin liquid Liquid Administration via Cup;Straw Medication Administration Whole meds with liquid Compensations Slow rate;Small sips/bites;Chin tuck Postural Changes Seated upright at 90 degrees   CHL IP OTHER RECOMMENDATIONS 06/26/2016 Recommended Consults -- Oral Care Recommendations Oral care BID Other Recommendations --   CHL IP FOLLOW UP RECOMMENDATIONS 06/26/2016 Follow up Recommendations Other (comment)   CHL IP FREQUENCY AND DURATION 06/26/2016 Speech Therapy Frequency (ACUTE ONLY) min 2x/week Treatment Duration 2 weeks      CHL IP ORAL PHASE 06/26/2016 Oral Phase WFL Oral - Pudding Teaspoon -- Oral - Pudding Cup -- Oral - Honey Teaspoon -- Oral - Honey Cup -- Oral - Nectar Teaspoon -- Oral - Nectar Cup -- Oral - Nectar Straw -- Oral - Thin Teaspoon -- Oral - Thin Cup -- Oral - Thin Straw -- Oral - Puree -- Oral - Mech Soft -- Oral - Regular -- Oral - Multi-Consistency -- Oral - Pill -- Oral Phase - Comment --  CHL IP PHARYNGEAL PHASE 06/26/2016 Pharyngeal Phase Impaired Pharyngeal- Pudding Teaspoon -- Pharyngeal -- Pharyngeal- Pudding Cup -- Pharyngeal -- Pharyngeal- Honey Teaspoon -- Pharyngeal -- Pharyngeal- Honey Cup -- Pharyngeal -- Pharyngeal- Nectar Teaspoon -- Pharyngeal -- Pharyngeal- Nectar Cup -- Pharyngeal -- Pharyngeal- Nectar Straw -- Pharyngeal -- Pharyngeal- Thin Teaspoon -- Pharyngeal -- Pharyngeal- Thin Cup Reduced tongue base retraction;Pharyngeal residue - valleculae;Pharyngeal residue - cp segment Pharyngeal -- Pharyngeal- Thin Straw Reduced tongue base retraction;Pharyngeal residue - valleculae;Pharyngeal residue - cp segment;Penetration/Aspiration during swallow Pharyngeal Material enters airway, remains ABOVE vocal cords then ejected out Pharyngeal- Puree Reduced tongue base retraction;Pharyngeal residue - valleculae Pharyngeal -- Pharyngeal- Mechanical Soft Reduced tongue base retraction;Pharyngeal residue - valleculae Pharyngeal -- Pharyngeal- Regular --  Pharyngeal -- Pharyngeal- Multi-consistency -- Pharyngeal -- Pharyngeal- Pill WFL Pharyngeal -- Pharyngeal Comment --  Gabriel Rainwater MA, CCC-SLP [phone] [place] Leah Meryl 06/26/2016, 3:15 PM               Scheduled Meds: . ampicillin-sulbactam (UNASYN) IV  3 g Intravenous Q6H  . DULoxetine  30 mg Oral Daily  . enoxaparin (LOVENOX) injection  40 mg Subcutaneous Q24H  . famotidine  40 mg Oral QHS  . fluconazole (DIFLUCAN) IV  100 mg Intravenous Q24H  . folic acid  1 mg Oral Daily  . Influenza vac split quadrivalent PF  0.5 mL Intramuscular Tomorrow-1000  . lactose free nutrition  1 Container Oral BID BM  . mouth rinse  15 mL Mouth Rinse BID  . mometasone-formoterol  2 puff Inhalation BID  . morphine  15 mg Oral Q12H  . multivitamin with minerals  1 tablet Oral Daily  . nystatin  5 mL Oral QID  . Obeticholic Acid  5 mg Oral Daily  . pantoprazole  40 mg Oral Q1200  . polyethylene glycol  34 g Oral BID  . sucralfate  1 g Oral TID WC & HS  . tiotropium  18 mcg Inhalation Daily  . vancomycin  750 mg Intravenous Q12H   Continuous Infusions: . sodium chloride 100 mL/hr at 06/27/16  0428    Principal Problem:   Aspiration into airway Active Problems:   Adenocarcinoma of right lung, stage 4 (HCC)   Acute respiratory failure with hypoxia (HCC)   Hypoxia   Aspiration pneumonia of both lower lobes (HCC)   Shortness of breath    Time spent: 35 minutes    Barton Dubois  [facility] Pager [phone]. If 7PM-7AM, please contact night-coverage at [email or web address], password [email or web address] 06/27/2016, 8:13 AM  LOS: 1 day

## 2016-06-28 DIAGNOSIS — Z95828 Presence of other vascular implants and grafts: Secondary | ICD-10-CM

## 2016-06-28 DIAGNOSIS — C7951 Secondary malignant neoplasm of bone: Secondary | ICD-10-CM

## 2016-06-28 DIAGNOSIS — C3491 Malignant neoplasm of unspecified part of right bronchus or lung: Secondary | ICD-10-CM

## 2016-06-28 DIAGNOSIS — C3490 Malignant neoplasm of unspecified part of unspecified bronchus or lung: Secondary | ICD-10-CM

## 2016-06-28 DIAGNOSIS — R0902 Hypoxemia: Secondary | ICD-10-CM

## 2016-06-28 DIAGNOSIS — J449 Chronic obstructive pulmonary disease, unspecified: Secondary | ICD-10-CM

## 2016-06-28 DIAGNOSIS — J189 Pneumonia, unspecified organism: Secondary | ICD-10-CM

## 2016-06-28 DIAGNOSIS — F17210 Nicotine dependence, cigarettes, uncomplicated: Secondary | ICD-10-CM

## 2016-06-28 DIAGNOSIS — Z21 Asymptomatic human immunodeficiency virus [HIV] infection status: Secondary | ICD-10-CM

## 2016-06-28 DIAGNOSIS — K7460 Unspecified cirrhosis of liver: Secondary | ICD-10-CM

## 2016-06-28 DIAGNOSIS — C3492 Malignant neoplasm of unspecified part of left bronchus or lung: Secondary | ICD-10-CM

## 2016-06-28 DIAGNOSIS — B20 Human immunodeficiency virus [HIV] disease: Secondary | ICD-10-CM

## 2016-06-28 DIAGNOSIS — E876 Hypokalemia: Secondary | ICD-10-CM

## 2016-06-28 DIAGNOSIS — D61810 Antineoplastic chemotherapy induced pancytopenia: Secondary | ICD-10-CM

## 2016-06-28 DIAGNOSIS — D709 Neutropenia, unspecified: Secondary | ICD-10-CM

## 2016-06-28 DIAGNOSIS — J690 Pneumonitis due to inhalation of food and vomit: Principal | ICD-10-CM

## 2016-06-28 DIAGNOSIS — G893 Neoplasm related pain (acute) (chronic): Secondary | ICD-10-CM

## 2016-06-28 DIAGNOSIS — R5081 Fever presenting with conditions classified elsewhere: Secondary | ICD-10-CM

## 2016-06-28 LAB — GLUCOSE, CAPILLARY
GLUCOSE-CAPILLARY: 85 mg/dL (ref 65–99)
GLUCOSE-CAPILLARY: 114 mg/dL — AB (ref 65–99)
GLUCOSE-CAPILLARY: 118 mg/dL — AB (ref 65–99)

## 2016-06-28 LAB — CBC WITH DIFFERENTIAL/PLATELET
BASOS ABS: 0 10*3/uL (ref 0.0–0.1)
BASOS PCT: 0 %
EOS ABS: 0 10*3/uL (ref 0.0–0.7)
Eosinophils Relative: 7 %
HCT: 28.2 % — ABNORMAL LOW (ref 39.0–52.0)
Hemoglobin: 9.1 g/dL — ABNORMAL LOW (ref 13.0–17.0)
LYMPHS PCT: 57 %
Lymphs Abs: 0.4 10*3/uL — ABNORMAL LOW (ref 0.7–4.0)
MCH: 29.3 pg (ref 26.0–34.0)
MCHC: 32.3 g/dL (ref 30.0–36.0)
MCV: 90.7 fL (ref 78.0–100.0)
MONO ABS: 0 10*3/uL — AB (ref 0.1–1.0)
Monocytes Relative: 0 %
NEUTROS ABS: 0.3 10*3/uL — AB (ref 1.7–7.7)
NEUTROS PCT: 36 %
PLATELETS: 49 10*3/uL — AB (ref 150–400)
RBC: 3.11 MIL/uL — ABNORMAL LOW (ref 4.22–5.81)
RDW: 16.7 % — ABNORMAL HIGH (ref 11.5–15.5)
WBC: 0.7 10*3/uL — CL (ref 4.0–10.5)

## 2016-06-28 LAB — BASIC METABOLIC PANEL
Anion gap: 6 (ref 5–15)
BUN: 9 mg/dL (ref 6–20)
CO2: 26 mmol/L (ref 22–32)
CREATININE: 0.7 mg/dL (ref 0.61–1.24)
Calcium: 7.7 mg/dL — ABNORMAL LOW (ref 8.9–10.3)
Chloride: 107 mmol/L (ref 101–111)
Glucose, Bld: 109 mg/dL — ABNORMAL HIGH (ref 65–99)
Potassium: 2.8 mmol/L — ABNORMAL LOW (ref 3.5–5.1)
SODIUM: 139 mmol/L (ref 135–145)

## 2016-06-28 LAB — CULTURE, RESPIRATORY: CULTURE: NORMAL

## 2016-06-28 LAB — CULTURE, RESPIRATORY W GRAM STAIN

## 2016-06-28 MED ORDER — MAGNESIUM SULFATE 2 GM/50ML IV SOLN
2.0000 g | Freq: Once | INTRAVENOUS | Status: AC
  Administered 2016-06-28: 2 g via INTRAVENOUS
  Filled 2016-06-28: qty 50

## 2016-06-28 MED ORDER — POTASSIUM CHLORIDE 10 MEQ/100ML IV SOLN
10.0000 meq | INTRAVENOUS | Status: AC
  Administered 2016-06-28 (×4): 10 meq via INTRAVENOUS
  Filled 2016-06-28 (×4): qty 100

## 2016-06-28 MED ORDER — HYDROCORTISONE 1 % EX CREA
TOPICAL_CREAM | CUTANEOUS | Status: DC | PRN
Start: 2016-06-28 — End: 2016-07-06
  Filled 2016-06-28: qty 28

## 2016-06-28 MED ORDER — TBO-FILGRASTIM 300 MCG/0.5ML ~~LOC~~ SOSY
300.0000 ug | PREFILLED_SYRINGE | Freq: Every day | SUBCUTANEOUS | Status: DC
  Administered 2016-06-28 – 2016-06-30 (×3): 300 ug via SUBCUTANEOUS
  Filled 2016-06-28 (×3): qty 0.5

## 2016-06-28 MED ORDER — FLUCONAZOLE 100 MG PO TABS
100.0000 mg | ORAL_TABLET | Freq: Every day | ORAL | Status: DC
  Administered 2016-06-29 – 2016-07-06 (×8): 100 mg via ORAL
  Filled 2016-06-28 (×10): qty 1

## 2016-06-28 MED ORDER — POTASSIUM CHLORIDE IN NACL 40-0.9 MEQ/L-% IV SOLN
INTRAVENOUS | Status: DC
  Administered 2016-06-28 – 2016-07-04 (×10): 75 mL/h via INTRAVENOUS
  Filled 2016-06-28 (×12): qty 1000

## 2016-06-28 MED ORDER — EMTRICITABINE-TENOFOVIR AF 200-25 MG PO TABS
1.0000 | ORAL_TABLET | Freq: Every day | ORAL | Status: DC
  Administered 2016-06-29 – 2016-07-06 (×8): 1 via ORAL
  Filled 2016-06-28 (×8): qty 1

## 2016-06-28 MED ORDER — DOLUTEGRAVIR SODIUM 50 MG PO TABS
50.0000 mg | ORAL_TABLET | Freq: Every day | ORAL | Status: DC
  Administered 2016-06-29 – 2016-07-06 (×8): 50 mg via ORAL
  Filled 2016-06-28 (×8): qty 1

## 2016-06-28 NOTE — Consult Note (Signed)
[facility] for [facility]       Reason for Consult: New HIV    Referring Physician: Dr. Carolin Sicks  Principal Problem:   Aspiration into airway Active Problems:   Adenocarcinoma of right lung, stage 4 (HCC)   Acute respiratory failure with hypoxia (HCC)   Hypoxia   Aspiration pneumonia of both lower lobes (HCC)   Shortness of breath   Hypokalemia   . ampicillin-sulbactam (UNASYN) IV  3 g Intravenous Q6H  . DULoxetine  30 mg Oral Daily  . famotidine  40 mg Oral QHS  . [START ON 06/29/2016] fluconazole  100 mg Oral Daily  . folic acid  1 mg Oral Daily  . lactose free nutrition  1 Container Oral BID BM  . magnesium sulfate 1 - 4 g bolus IVPB  2 g Intravenous Once  . mouth rinse  15 mL Mouth Rinse BID  . mometasone-formoterol  2 puff Inhalation BID  . morphine  15 mg Oral Q12H  . multivitamin with minerals  1 tablet Oral Daily  . nystatin  5 mL Oral QID  . Obeticholic Acid  5 mg Oral Daily  . pantoprazole  40 mg Oral Q1200  . polyethylene glycol  34 g Oral BID  . potassium chloride  10 mEq Intravenous Q1 Hr x 4  . sucralfate  1 g Oral TID WC & HS  . tiotropium  18 mcg Inhalation Daily  . 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  He does not recall a previous test and review in Care Everywhere does not show any previous testing.  No identified risk factors.  I will double check with a viral load for further confirmation as well as work up but will start treatment tomorrow regardless since he is actively getting chemotherapy.   Pneumonia - started acutely, no significant hypoxia and most c/w aspiration.  PCP also a consideration but CT not typical and no signficant  hypoxia making this less likely.  Has been afebrile and no positive cultures so no indication for vancomycin.  Neutropenia - may be exacerbated by HIV.  No fever.    Antibiotics: Vancomycin and amp/sulbactam  HPI: Jeffrey Gutierrez is a 60 y.o. male with a history of primary biliary cirrhosis and adenocarcinoma currently undergoing chemotherapy at [facility] with recent initiation of chemotherapy for adenocarcinoma of the lungs with metastasis to bones who presented to hospital with SOB and CT and symptoms concerning for aspiration pneumonia.  He has been on amp/sulbactam and vancomycin.  No fever.  He has a port with no tenderness or erythema.  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  In review in Care Everywhere, no previous test noted.  He does not recall every being tested prior to this.  He is heterosexual with only sexual encounter his wife of 37 years.  Denies MSM.  No previous drug use, no tattoos.  Never has traveled out of the [place].  Never had a blood transfusion.   CT independently reviewed and does not appear c/w PCP.   Review of Systems:  Constitutional: negative for fevers and chills Gastrointestinal: negative for diarrhea Musculoskeletal: negative for myalgias and arthralgias All other systems reviewed and are negative  Past Medical History:  Diagnosis Date  . Bone cancer ([place])   . COPD (chronic obstructive pulmonary disease) ([place])   . Diverticulitis   . Essential hypertension   . GERD (gastroesophageal reflux disease)   . Hiatal hernia   . Hyperplastic rectal polyp   . Lung cancer ([place])   . PBC (primary biliary cirrhosis)    Diagnosed 2006  . S/P colonoscopy 2009   Pancolonic diverticula, hyperplastic rectal polyps    Social History  Substance Use Topics  . Smoking status: Current Some Day Smoker    Packs/day: 0.50    Years: 43.00    Types: Cigarettes  . Smokeless tobacco: Never Used  . Alcohol use 0.0 oz/week     Comment: occassional    Family History  Problem Relation  Age of Onset  . Lung cancer Mother   . Pneumonia Father   . Lung cancer Father   . Pancreatitis Father     Allergies  Allergen Reactions  . Dihydrocodeine Anaphylaxis  . Hydrocodone Anaphylaxis and Other (See Comments)    Throat closes up  Patient states PCP gave 11/2015 and he has been tolerating it.  . Doxycycline Nausea And Vomiting  . Linzess [Linaclotide] Diarrhea    Physical Exam: Constitutional: in no apparent distress  Vitals:   06/27/16 2045 06/28/16 0452  BP: 129/69 125/75  Pulse: 85 84  Resp: 16 16  Temp: 99.1 F (37.3 C) 98.3 F (36.8 C)   EYES: anicteric ENMT: no thrush Cardiovascular: Cor RRR CHEST: port without erythema Respiratory: CTA B; normal respiratory effort GI: Bowel sounds are normal, liver is not enlarged, spleen is not enlarged Musculoskeletal: no pedal edema noted Skin: negatives: no rash Hematologic: no cervical lad  Lab Results  Component Value Date   WBC 0.7 (LL) 06/28/2016   HGB 9.1 (L) 06/28/2016   HCT 28.2 (L) 06/28/2016   MCV 90.7 06/28/2016   PLT 49 (L) 06/28/2016    Lab Results  Component Value Date   CREATININE 0.70 06/28/2016   BUN 9 06/28/2016   NA 139 06/28/2016   K 2.8 (L) 06/28/2016   CL 107 06/28/2016   CO2 26 06/28/2016    Lab Results  Component Value Date   ALT 12 (L) 06/07/2016   AST 22 06/07/2016   ALKPHOS 108 06/07/2016     Microbiology: Recent Results (from the past 240 hour(s))  Culture, blood (routine x 2) Call MD if unable to obtain prior to antibiotics being given     Status: None (Preliminary result)   Collection Time: 06/26/16  2:00 AM  Result Value Ref Range Status   Specimen Description BLOOD LEFT ANTECUBITAL  Final   Special Requests BOTTLES DRAWN AEROBIC AND ANAEROBIC 5CC  Final   Culture   Final    NO GROWTH 1 DAY Performed at [facility]    Report Status PENDING  Incomplete  Culture, blood (routine x 2) Call MD if unable to obtain prior to antibiotics being given     Status:  None (Preliminary result)   Collection Time: 06/26/16  2:02 AM  Result Value Ref Range Status   Specimen Description BLOOD RIGHT FOREARM  Final   Special Requests BOTTLES DRAWN AEROBIC AND ANAEROBIC 5CC  Final   Culture   Final    NO GROWTH 1 DAY Performed at [facility]    Report Status PENDING  Incomplete  MRSA PCR Screening     Status: None   Collection Time: 06/26/16  3:44 AM  Result Value Ref Range Status   MRSA by PCR NEGATIVE NEGATIVE Final    Comment:        The GeneXpert MRSA Assay (FDA approved for NASAL specimens only), is one component of a comprehensive MRSA colonization surveillance program. It is not intended to diagnose MRSA infection nor to guide or monitor treatment for MRSA infections.   Culture, sputum-assessment     Status: None   Collection Time: 06/26/16  2:34 PM  Result Value Ref Range Status   Specimen Description SPUTUM  Final   Special Requests NONE  Final   Sputum evaluation   Final    THIS SPECIMEN IS ACCEPTABLE. RESPIRATORY CULTURE REPORT TO FOLLOW.   Report Status 06/26/2016 FINAL  Final  Culture, respiratory (NON-Expectorated)     Status: None   Collection Time: 06/26/16  2:34 PM  Result Value Ref Range Status   Specimen Description SPUTUM  Final   Special Requests NONE  Final   Gram Stain   Final    MODERATE WBC PRESENT, PREDOMINANTLY PMN FEW GRAM POSITIVE COCCI IN PAIRS FEW GRAM NEGATIVE RODS FEW GRAM POSITIVE RODS    Culture   Final    Consistent with normal respiratory flora. Performed at [facility]    Report Status 06/28/2016 FINAL  Final    Scharlene Gloss, [place] for Infect[facility] [email or web address] [phone] pager  [phone] cell 06/28/2016, 1:56 PM

## 2016-06-28 NOTE — Consult Note (Signed)
[place]  [phone]   HEMATOLOGY ONCOLOGY INPATIENT CONSULTATION   CEDERICK BROADNAX  DOB: 11-11-1955  MR#: [id number]  CSN#: [id number]    Requesting Physician: [facility]  Patient Care Team: Sharilyn Sites, MD as PCP - General (Family Medicine) Daneil Dolin, MD (Gastroenterology)  Reason for consult: neutropenia   History of present illness:   60 year old Caucasian male, with past medical history of metastatic lung adenocarcinoma, on chemotherapy, COPD, PBC, early liver cirrhosis, presented with worsening short of breath. No fever, cough, or chest pain. CT chest showed bilateral lower lobe aspiration, he was started on broad antibiotics for presumed aspiration pneumonia. His lab work showed significant neutropenia, with ANC 0.3, mild anemia and moderate thrombocytopenia with platelets of 49K today, no bleeding.   He was diagnosed with metastatic lung adenocarcinoma in 02/2016, with metastasis to right pleura and adjacent ribs, mediastinal lymph node. He was seen by oncologist Dr. Mariel Kansky at [facility] and started systemic chemotherapy with carboplatin and pemetrexed, status post 2 cycles, with last cycle on 06/20/2016. He did not receive Neulasta. He overall tolerated chemotherapy well, with moderate fatigue and nausea, but was able to maintain adequate by mouth intake.   Of note, he was also tested positive for HIV on this admission, no known prior history of HIV. He was seen by infection disease doctor Dr. Linus Salmons today.   MEDICAL HISTORY:  Past Medical History:  Diagnosis Date  . Bone cancer ([place])   . COPD (chronic obstructive pulmonary disease) ([place])   . Diverticulitis   . Essential hypertension   . GERD (gastroesophageal reflux disease)   . Hiatal hernia   . Hyperplastic rectal polyp   . Lung cancer ([place])   . PBC (primary biliary cirrhosis)    Diagnosed 2006  . S/P colonoscopy 2009   Pancolonic diverticula, hyperplastic rectal polyps    SURGICAL  HISTORY: Past Surgical History:  Procedure Laterality Date  . Arm Surgery Left    ulnar nerve release  . CHOLECYSTECTOMY    . COLONOSCOPY  2009   Pancolonic diverticula, hyperplastic rectal polyps  . COLONOSCOPY [name] 03/24/2015   RMR: Single colonic polyp removed as described above. Pancolonic diverticulosis. Abnormal sigmoid colon consistent with stigmata of recent diverticulitis.   Marland Kitchen ESOPHAGOGASTRODUODENOSCOPY [name] 06/28/2015   RMR: mild erosive reflux/small HH  . INGUINAL HERNIA REPAIR Right Feb 2012   Dr. Arnoldo Morale   . INGUINAL HERNIA REPAIR Left 11/12/2015   Procedure: HERNIA REPAIR INGUINAL ADULT WITH MESH;  Surgeon: Aviva Signs, MD;  Location: [facility];  Service: General;  Laterality: Left;  . INSERTION OF MESH Left 11/12/2015   Procedure: INSERTION OF MESH;  Surgeon: Aviva Signs, MD;  Location: [facility];  Service: General;  Laterality: Left;  . LIVER BIOPSY    . NASAL SEPTUM SURGERY      SOCIAL HISTORY: Social History   Social History  . Marital status: Married    Spouse name: [name]  . Number of children: [name]  . Years of education: [name]   Occupational History  . Not on file.   Social History Main Topics  . Smoking status: Current Some Day Smoker    Packs/day: 0.50    Years: 43.00    Types: Cigarettes  . Smokeless tobacco: Never Used  . Alcohol use 0.0 oz/week     Comment: occassional  . Drug use: No  . Sexual activity: Yes   Other Topics Concern  . Not on file   Social History Narrative  .  No narrative on file    FAMILY HISTORY: Family History  Problem Relation Age of Onset  . Lung cancer Mother   . Pneumonia Father   . Lung cancer Father   . Pancreatitis Father     ALLERGIES:  is allergic to dihydrocodeine; hydrocodone; doxycycline; and linzess [linaclotide].  MEDICATIONS:  Current Facility-Administered Medications  Medication Dose Route Frequency Provider Last Rate Last Dose  . 0.9 % NaCl with KCl 40 mEq / L  infusion   Intravenous Continuous Dron Tanna Furry, MD 75 mL/hr at 06/28/16 1225 75 mL/hr at 06/28/16 1225  . albuterol (PROVENTIL) (2.5 MG/3ML) 0.083% nebulizer solution 2.5 mg  2.5 mg Inhalation Q4H PRN Etta Quill, DO      . Ampicillin-Sulbactam (UNASYN) 3 g in sodium chloride 0.9 % 100 mL IVPB  3 g Intravenous Q6H Leann T Poindexter, RPH   3 g at 06/28/16 2208  . [START ON 06/29/2016] dolutegravir (TIVICAY) tablet 50 mg  50 mg Oral Daily Thayer Headings, MD      . DULoxetine (CYMBALTA) DR capsule 30 mg  30 mg Oral Daily Etta Quill, DO   30 mg at 06/28/16 1015  . [START ON 06/29/2016] emtricitabine-tenofovir AF (DESCOVY) 200-25 MG per tablet 1 tablet  1 tablet Oral Daily Thayer Headings, MD      . famotidine (PEPCID) tablet 40 mg  40 mg Oral QHS Barton Dubois, MD   40 mg at 06/28/16 2207  . [START ON 06/29/2016] fluconazole (DIFLUCAN) tablet 100 mg  100 mg Oral Daily Dron Tanna Furry, MD      . folic acid (FOLVITE) tablet 1 mg  1 mg Oral Daily Etta Quill, DO   1 mg at 06/28/16 1015  . hydrocortisone cream 1 %   Topical PRN Barton Dubois, MD      . ibuprofen (ADVIL,MOTRIN) tablet 200 mg  200 mg Oral Q6H PRN Etta Quill, DO      . lactose free nutrition (BOOST PLUS) liquid 237 mL  1 Container Oral BID BM Dron Tanna Furry, MD   237 mL at 06/28/16 1434  . MEDLINE mouth rinse  15 mL Mouth Rinse BID Etta Quill, DO   15 mL at 06/28/16 2209  . mometasone-formoterol (DULERA) 200-5 MCG/ACT inhaler 2 puff  2 puff Inhalation BID Etta Quill, DO   2 puff at 06/28/16 2024  . morphine (MS CONTIN) 12 hr tablet 15 mg  15 mg Oral Q12H Etta Quill, DO   15 mg at 06/28/16 2208  . morphine (MSIR) tablet 15 mg  15 mg Oral Q4H PRN Etta Quill, DO   15 mg at 06/28/16 0433  . morphine 2 MG/ML injection 2-3 mg  2-3 mg Intravenous Q2H PRN Barton Dubois, MD   2 mg at 06/28/16 1854  . multivitamin with minerals tablet 1 tablet  1 tablet Oral Daily Etta Quill, DO   1 tablet at 06/28/16 1016  . nystatin (MYCOSTATIN)  100000 UNIT/ML suspension 500,000 Units  5 mL Oral QID Barton Dubois, MD   500,000 Units at 06/28/16 2208  . Obeticholic Acid TABS 5 mg  5 mg Oral Daily Etta Quill, DO      . ondansetron [facility]) injection 4 mg  4 mg Intravenous Q6H PRN Jeryl Columbia, NP   4 mg at 06/27/16 0205  . pantoprazole (PROTONIX) EC tablet 40 mg  40 mg Oral Q1200 Barton Dubois, MD   40 mg at 06/28/16  1203  . polyethylene glycol (MIRALAX / GLYCOLAX) packet 34 g  34 g Oral BID Etta Quill, DO      . sodium chloride flush (NS) 0.9 % injection 10-40 mL  10-40 mL Intracatheter PRN Barton Dubois, MD      . sucralfate (CARAFATE) 1 GM/10ML suspension 1 g  1 g Oral TID WC & HS Barton Dubois, MD   1 g at 06/28/16 2208  . Tbo-Filgrastim (GRANIX) injection 300 mcg  300 mcg Subcutaneous q1800 Truitt Merle, MD   300 mcg at 06/28/16 1943  . tiotropium (SPIRIVA) inhalation capsule 18 mcg  18 mcg Inhalation Daily Etta Quill, DO   18 mcg at 06/28/16 3614    REVIEW OF SYSTEMS:   Constitutional: Denies fevers, chills or abnormal night sweats Eyes: Denies blurriness of vision, double vision or watery eyes Ears, nose, mouth, throat, and face: Denies mucositis or sore throat Respiratory: Denies cough, dyspnea or wheezes Cardiovascular: Denies palpitation, chest discomfort or lower extremity swelling Gastrointestinal:  Denies nausea, heartburn or change in bowel habits Skin: Denies abnormal skin rashes Lymphatics: Denies new lymphadenopathy or easy bruising Neurological:Denies numbness, tingling or new weaknesses Behavioral/Psych: Mood is stable, no new changes  All other systems were reviewed with the patient and are negative.  PHYSICAL EXAMINATION: ECOG PERFORMANCE STATUS: 3 - Symptomatic, >50% confined to bed  Vitals:   06/28/16 1422 06/28/16 2018  BP: 130/74 (!) 139/91  Pulse: 85 84  Resp: 16 16  Temp: 98.1 F (36.7 C) 98.1 F (36.7 C)   Filed Weights   06/25/16 2016 06/26/16 0400  Weight: 134 lb (60.8 kg)  125 lb 3.5 oz (56.8 kg)    GENERAL:alert, no distress and comfortable SKIN: skin color, texture, turgor are normal, no rashes or significant lesions EYES: normal, conjunctiva are pink and non-injected, sclera clear OROPHARYNX:no exudate, no erythema and lips, buccal mucosa, and tongue normal  NECK: supple, thyroid normal size, non-tender, without nodularity LYMPH:  no palpable lymphadenopathy in the cervical, axillary or inguinal LUNGS: clear to auscultation and percussion with normal breathing effort HEART: regular rate & rhythm and no murmurs and no lower extremity edema ABDOMEN:abdomen soft, non-tender and normal bowel sounds Musculoskeletal:no cyanosis of digits and no clubbing  PSYCH: alert & oriented x 3 with fluent speech NEURO: no focal motor/sensory deficits  LABORATORY DATA:  I have reviewed the data as listed Lab Results  Component Value Date   WBC 0.7 (LL) 06/28/2016   HGB 9.1 (L) 06/28/2016   HCT 28.2 (L) 06/28/2016   MCV 90.7 06/28/2016   PLT 49 (L) 06/28/2016    Recent Labs  12/19/15 1125  04/27/16 2020  06/07/16 1358 06/25/16 2123 06/27/16 0312 06/28/16 0900  NA 134*  < > 129*  < > 134* 139 139 139  K 4.4  < > 4.2  < > 3.8 4.1 3.5 2.8*  CL 100*  < > 94*  < > 96* 97* 103 107  CO2 28  < > 30  < > 33* 33* 31 26  GLUCOSE 105*  < > 127*  < > 87 105* 112* 109*  BUN 7  < > 11  < > 12 28* 14 9  CREATININE 0.71  < > 0.64  < > 0.83 1.02 0.77 0.70  CALCIUM 8.6*  < > 7.9*  < > 8.4* 9.0 8.2* 7.7*  GFRNONAA >60  < > >60  < > >60 >60 >60 >60  GFRAA >60  < > >60  < > >  60 >60 >60 >60  PROT 7.8  --  6.8  --  7.1  --   --   --   ALBUMIN 3.4*  --  2.7*  --  2.6*  --   --   --   AST 23  --  23  --  22  --   --   --   ALT 15*  --  16*  --  12*  --   --   --   ALKPHOS 97  --  86  --  108  --   --   --   BILITOT 0.4  --  0.5  --  0.4  --   --   --   < > = values in this interval not displayed.  RADIOGRAPHIC STUDIES: I have personally reviewed the radiological images as  listed and agreed with the findings in the report. Dg Chest 2 View  Result Date: 06/25/2016 CLINICAL DATA:  Shortness of breath. EXAM: CHEST  2 VIEW COMPARISON:  06/07/2016 FINDINGS: The right-sided IJ power port is in stable position. The heart is normal in size. The mediastinal and hilar contours are normal and stable. Stable advanced emphysematous changes and pulmonary scarring. No acute overlying pulmonary process. Pleural lesions seen on the prior PET-CT are not well visualized on this study. The bony thorax is intact. IMPRESSION: Stable advanced emphysematous changes but no acute overlying pulmonary process. Electronically Signed   By: Marijo Sanes M.D.   On: 06/25/2016 20:55   Dg Chest 2 View  Result Date: 06/07/2016 CLINICAL DATA:  Near syncope EXAM: CHEST  2 VIEW COMPARISON:  04/27/2016 chest x-ray FINDINGS: Porta catheter on the right with loose kink at the level of the clavicle crossing. Catheter tip is at the SVC level. Normal heart size and mediastinal contours. Emphysema. There is no edema, consolidation, effusion, or pneumothorax. Nodular density over the right chest wall could be pulmonary or nipple shadow based on 04/27/2016 chest CT. Patient's right thoracic mass and spinal erosion are obscured. IMPRESSION: No evidence of acute disease. Electronically Signed   By: Monte Fantasia M.D.   On: 06/07/2016 13:51   Ct Angio Chest Pe W Or Wo Contrast  Result Date: 06/26/2016 CLINICAL DATA:  Acute onset of worsening shortness of breath. Known stage IV lung cancer, on chemotherapy. Initial encounter. EXAM: CT ANGIOGRAPHY CHEST WITH CONTRAST TECHNIQUE: Multidetector CT imaging of the chest was performed using the standard protocol during bolus administration of intravenous contrast. Multiplanar CT image reconstructions and MIPs were obtained to evaluate the vascular anatomy. CONTRAST:  100 mL of Isovue 370 IV contrast COMPARISON:  Chest radiograph performed earlier today at 8:24 p.m., and CT of  the chest performed 04/27/2016 FINDINGS: Cardiovascular:  There is no evidence of pulmonary embolus. The heart is unremarkable in appearance. The thoracic aorta remains normal in caliber. No significant calcific atherosclerotic disease is seen. The great vessels are grossly unremarkable. Mediastinum/Nodes: Diffuse wall thickening is noted along the esophagus, concerning for esophagitis. No mediastinal lymphadenopathy is seen. No pericardial effusion is identified. The thyroid gland is unremarkable. No axillary lymphadenopathy is appreciated. Lungs/Pleura: Note is made of near complete opacification of the bronchioles to both lower lung lobes, suspicious for aspiration. Minimal underlying bibasilar opacities are noted. Underlying bilateral emphysematous change is seen, particularly at the lung apices. No pleural effusion or pneumothorax is identified. The patient's lung cancer is slightly less well characterized than on the prior study, with wall thickening along the posterior medial aspect of  the right hemithorax, and associated underlying osseous metastatic changes. Upper Abdomen: The visualized portions of the liver and spleen are grossly unremarkable. The visualized portions of the pancreas, adrenal glands and kidneys are within normal limits. Musculoskeletal: Metastatic disease is again noted at vertebral body T5, with right-sided sclerosis. The visualized musculature is unremarkable in appearance. Review of the MIP images confirms the above findings. IMPRESSION: 1. No evidence of pulmonary embolus. 2. Near complete opacification of the bronchioles to both lower lung lobes, suspicious for significant aspiration. Minimal underlying bibasilar airspace opacities noted. 3. Underlying bilateral emphysematous change, particularly at the lung apices. 4. Diffuse wall thickening along the esophagus, concerning for esophagitis. 5. Right-sided lung cancer is slightly less well characterized than on the prior study, with  diffuse wall thickening again noted along the posteromedial aspect of the right hemithorax. Underlying osseous metastatic changes again noted, most prominent at T5. Electronically Signed   By: Garald Balding M.D.   On: 06/26/2016 00:27   Dg Swallowing Func-speech Pathology  Result Date: 06/26/2016 Objective Swallowing Evaluation: Type of Study: MBS-Modified Barium Swallow Study Patient Details Name: Jeffrey Gutierrez MRN: [id number] Date of Birth: [birth year] Today's Date: 06/26/2016 Time: SLP Start Time (ACUTE ONLY): 1420-SLP Stop Time (ACUTE ONLY): 1440 SLP Time Calculation (min) (ACUTE ONLY): 20 min Past Medical History: Past Medical History: Diagnosis Date . Bone cancer ([place])  . COPD (chronic obstructive pulmonary disease) ([place])  . Diverticulitis  . Essential hypertension  . GERD (gastroesophageal reflux disease)  . Hiatal hernia  . Hyperplastic rectal polyp  . Lung cancer ([place])  . PBC (primary biliary cirrhosis)   Diagnosed 2006 . S/P colonoscopy 2009  Pancolonic diverticula, hyperplastic rectal polyps Past Surgical History: Past Surgical History: Procedure Laterality Date . Arm Surgery Left   ulnar nerve release . CHOLECYSTECTOMY   . COLONOSCOPY  2009  Pancolonic diverticula, hyperplastic rectal polyps . COLONOSCOPY [name] 03/24/2015  RMR: Single colonic polyp removed as described above. Pancolonic diverticulosis. Abnormal sigmoid colon consistent with stigmata of recent diverticulitis.  Marland Kitchen ESOPHAGOGASTRODUODENOSCOPY [name] 06/28/2015  RMR: mild erosive reflux/small HH . INGUINAL HERNIA REPAIR Right Feb 2012  Dr. Arnoldo Morale  . INGUINAL HERNIA REPAIR Left 11/12/2015  Procedure: HERNIA REPAIR INGUINAL ADULT WITH MESH;  Surgeon: Aviva Signs, MD;  Location: [facility];  Service: General;  Laterality: Left; . INSERTION OF MESH Left 11/12/2015  Procedure: INSERTION OF MESH;  Surgeon: Aviva Signs, MD;  Location: [facility];  Service: General;  Laterality: Left; . LIVER BIOPSY   . NASAL SEPTUM SURGERY   HPI: VIRAAJ VORNDRAN a 60  y.o.malewith medical history significant of stage 4 adenocarcinoma of lung, COPD. Pt presents to the ED with sudden onset of severe SOB. CXR shows bilateral aspiration. Patient with c/o difficulty swallowing and odynophagia beginning 10/5. S/p radiation treatment 03/2016.  No Data Recorded Assessment / Plan / Recommendation CHL IP CLINICAL IMPRESSIONS 06/26/2016 Therapy Diagnosis Mild pharyngeal phase dysphagia;Moderate pharyngeal phase dysphagia Clinical Impression Patient presents with a moderate pharyngeal phase dysphagia characterized by retention of barium in vallecula (moderate) and above CP (mild ) post swallow as well as mildly decreased laryngeal closure resulting in trace penetration during the swallow with thin liquids. Use of chin tuck assists to fully clear the pharynx of bolus as well as prevent penetration/aspiration. Unclear cause of deficits at this time as hyolaryngeal elevation and pharyngeal contraction all appear WFL and oral motor exam is unrevealing. Question presence of pharyngo-esophageal candida as patient with c/o painful swallow. Note MD is initiating meds for  possible candida. Will f/u at bedside for improvement and need for further testing depending on progress/changes in function.  Impact on safety and function Moderate aspiration risk;Mild aspiration risk   CHL IP TREATMENT RECOMMENDATION 06/26/2016 Treatment Recommendations Therapy as outlined in treatment plan below   Prognosis 06/26/2016 Prognosis for Safe Diet Advancement Fair Barriers to Reach Goals -- Barriers/Prognosis Comment -- CHL IP DIET RECOMMENDATION 06/26/2016 SLP Diet Recommendations Regular solids;Thin liquid Liquid Administration via Cup;Straw Medication Administration Whole meds with liquid Compensations Slow rate;Small sips/bites;Chin tuck Postural Changes Seated upright at 90 degrees   CHL IP OTHER RECOMMENDATIONS 06/26/2016 Recommended Consults -- Oral Care Recommendations Oral care BID Other Recommendations --   CHL  IP FOLLOW UP RECOMMENDATIONS 06/26/2016 Follow up Recommendations Other (comment)   CHL IP FREQUENCY AND DURATION 06/26/2016 Speech Therapy Frequency (ACUTE ONLY) min 2x/week Treatment Duration 2 weeks      CHL IP ORAL PHASE 06/26/2016 Oral Phase WFL Oral - Pudding Teaspoon -- Oral - Pudding Cup -- Oral - Honey Teaspoon -- Oral - Honey Cup -- Oral - Nectar Teaspoon -- Oral - Nectar Cup -- Oral - Nectar Straw -- Oral - Thin Teaspoon -- Oral - Thin Cup -- Oral - Thin Straw -- Oral - Puree -- Oral - Mech Soft -- Oral - Regular -- Oral - Multi-Consistency -- Oral - Pill -- Oral Phase - Comment --  CHL IP PHARYNGEAL PHASE 06/26/2016 Pharyngeal Phase Impaired Pharyngeal- Pudding Teaspoon -- Pharyngeal -- Pharyngeal- Pudding Cup -- Pharyngeal -- Pharyngeal- Honey Teaspoon -- Pharyngeal -- Pharyngeal- Honey Cup -- Pharyngeal -- Pharyngeal- Nectar Teaspoon -- Pharyngeal -- Pharyngeal- Nectar Cup -- Pharyngeal -- Pharyngeal- Nectar Straw -- Pharyngeal -- Pharyngeal- Thin Teaspoon -- Pharyngeal -- Pharyngeal- Thin Cup Reduced tongue base retraction;Pharyngeal residue - valleculae;Pharyngeal residue - cp segment Pharyngeal -- Pharyngeal- Thin Straw Reduced tongue base retraction;Pharyngeal residue - valleculae;Pharyngeal residue - cp segment;Penetration/Aspiration during swallow Pharyngeal Material enters airway, remains ABOVE vocal cords then ejected out Pharyngeal- Puree Reduced tongue base retraction;Pharyngeal residue - valleculae Pharyngeal -- Pharyngeal- Mechanical Soft Reduced tongue base retraction;Pharyngeal residue - valleculae Pharyngeal -- Pharyngeal- Regular -- Pharyngeal -- Pharyngeal- Multi-consistency -- Pharyngeal -- Pharyngeal- Pill WFL Pharyngeal -- Pharyngeal Comment --  Gabriel Rainwater MA, CCC-SLP [phone] [place] Leah Meryl 06/26/2016, 3:15 PM               ASSESSMENT & PLAN:  60 year old Caucasian male, with past medical history of recently diagnosed metastatic lung adenocarcinoma to right pleura and lymph  nodes, COPD, PBC and liver cirrhosis, was admitted for aspiration pneumonia.  1. Acute respiratory failure with hypoxia secondary to aspiration pneumonia versus HCAP 2. Pancytopenia secondary to chemotherapy, severe neutropenia with ANC 0.3K 3. Metastatic lung adenocarcinoma, status post 2 cycle chemotherapy, last on 06/20/2069 4. Newly discovered HIV infection 5. History of COPD 6. PBC and early liver cirrhosis 7. Chronic pain, secondary to his metastatic lung cancer  Recommendations: -due to his bilateral pneumonia with hypoxia, and the severe neutropenia, I recommend Granix 34mg daily until ANC>=1500, I placed order  -Pneumonia treatment per hospitalist and ID -I told patient his next cycle chemotherapy may need to be postponed due to his pneumonia and hospitalization -I suggest him to add Neulasta to future chemo carbo and alimta  -[facility]  -I will follow up as needed -[facility]   All questions were answered. The patient knows to  call the clinic with any problems, questions or concerns.      Truitt Merle, MD 06/28/2016 10:41 PM

## 2016-06-28 NOTE — Progress Notes (Signed)
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 Patient developed neutropenia and has HIV 1 antibody positive. Oncology and infectious disease consult called today.  Assessment & Plan:   #Acute respiratory failure with hypoxia secondary due to aspiration pneumonia versus HCAP: -Continue IV vancomycin and Unasyn. -Follow-up culture results. -Continue bronchodilators, respiratory therapy and wean oxygen as tolerated. -Currently on 2-3 L of oxygen via nasal cannula to maintain oxygen saturation.  #Pancytopenia/Neutropenia due to chemotherapy: -Place neutropenic precaution. -May benefit from Neulasta, discussed with oncologist Dr.Feng for the consult. -Monitor labs.  #Metastatic stage IV adenocarcinoma of lung: -Undergoing chemotherapy at [facility]. Follow-up oncology for further evaluation and outpatient follow-up at [facility].  #HIV 1 antibody positive: Reviewed that this result with the patient at bedside. Patient reported that he is not aware of HIV diagnosis in the past. -Denied any IV drug use -Reported that he has sexual interaction with his wife only -I discussed with Dr. Linus Salmons from infectious disease for further evaluation.  #Severe hypokalemia likely due to decreased oral intake: Repleted potassium chloride IV. Repeat lab in the morning. -repleted Mgso4 -Repeat lab in the morning.  #Presumed esophagitis, likely candida infection: Continue nystatin and Diflucan. Follow-up infectious disease.   #Severe protein calorie malnutrition likely in the setting of malignancy: Dietary referral and oral supplement.  #History of COPD and tobacco use:  Continue bronchodilators and respiratory therapy.  #History of GERD: Continue Pepcid  #Chronic pain management: Continue current dose of morphine. Pain seems adequately controlled. Continue to monitor.  DVT prophylaxis: SCD and early embolism. Avoid anticoagulation because of pancytopenia. Code Status: Full code Family Communication: No family present at bedside Disposition Plan: Likely discharge home with home care in 2-3 days.   Consultants:   Consult called to infectious disease and oncologist today.  Procedures: no  Antimicrobials: Vancomycin and Unasyn since 06/26/2016  Subjective: Patient was seen and examined at bedside. Patient reported generalized weakness, cough with yellow phlegm. Denied shortness of breath, chest pain. He has nausea but denied vomiting or abdominal pain. Denied dysuria, urgency, diarrhea or constipation.  Objective: Vitals:   06/27/16 2045 06/28/16 0452 06/28/16 0836 06/28/16 0844  BP: 129/69 125/75    Pulse: 85 84    Resp: 16 16    Temp: 99.1 F (37.3 C) 98.3 F (36.8 C)    TempSrc: Oral Oral    SpO2: 93% 97% 93% 93%  Weight:      Height:        Intake/Output Summary (Last 24 hours) at 06/28/16 1204 Last data filed at 06/28/16 0825  Gross per 24 hour  Intake          2483.33 ml  Output                0 ml  Net          2483.33 ml   Filed Weights   06/25/16 2016 06/26/16 0400  Weight: 60.8 kg (134 lb) 56.8 kg (125 lb 3.5 oz)    Examination:  General exam: Appears calm and comfortable. Thin male Respiratory system:  Respiratory effort normal.Bibasal crackles. No wheezing appreciated Cardiovascular system: S1 & S2 heard, RRR. No pedal edema. Gastrointestinal system: Abdomen is  nondistended, soft and nontender. Normal bowel sounds heard. Central nervous system: Alert and oriented. No focal neurological deficits. Extremities: Symmetric 5 x 5 power. Skin: No rashes, lesions or ulcers Psychiatry: Judgement and insight appear normal.  Mood & affect appropriate.     Data Reviewed: I have personally reviewed following labs and imaging studies  CBC:  Recent Labs Lab 06/25/16 2123 06/27/16 0312 06/28/16 0900  WBC 3.6* 1.2* 0.7*  NEUTROABS 2.6 0.9* 0.3*  HGB 11.4* 10.1* 9.1*  HCT 35.8* 32.0* 28.2*  MCV 91.8 92.0 90.7  PLT 89* 70* 49*   Basic Metabolic Panel:  Recent Labs Lab 06/25/16 2123 06/27/16 0312 06/28/16 0900  NA 139 139 139  K 4.1 3.5 2.8*  CL 97* 103 107  CO2 33* 31 26  GLUCOSE 105* 112* 109*  BUN 28* 14 9  CREATININE 1.02 0.77 0.70  CALCIUM 9.0 8.2* 7.7*  MG  --  1.7  --    GFR: Estimated Creatinine Clearance: 80.9 mL/min (by C-G formula based on SCr of 0.7 mg/dL). Liver Function Tests: No results for input(s): AST, ALT, ALKPHOS, BILITOT, PROT, ALBUMIN in the last 168 hours. No results for input(s): LIPASE, AMYLASE in the last 168 hours. No results for input(s): AMMONIA in the last 168 hours. Coagulation Profile: No results for input(s): INR, PROTIME in the last 168 hours. Cardiac Enzymes: No results for input(s): CKTOTAL, CKMB, CKMBINDEX, TROPONINI in the last 168 hours. BNP (last 3 results) No results for input(s): PROBNP in the last 8760 hours. HbA1C: No results for input(s): HGBA1C in the last 72 hours. CBG:  Recent Labs Lab 06/27/16 2211 06/28/16 0735 06/28/16 1130  GLUCAP 123* 85 114*   Lipid Profile: No results for input(s): CHOL, HDL, LDLCALC, TRIG, CHOLHDL, LDLDIRECT in the last 72 hours. Thyroid Function Tests: No results for input(s): TSH, T4TOTAL, FREET4, T3FREE, THYROIDAB in the last 72 hours. Anemia Panel: No results for input(s): VITAMINB12, FOLATE, FERRITIN, TIBC, IRON, RETICCTPCT in the last 72 hours. Sepsis Labs:  Recent Labs Lab 06/26/16 0033  LATICACIDVEN 0.60    Recent Results (from the past 240 hour(s))  Culture, blood (routine x 2) Call MD if unable to obtain prior to antibiotics being given     Status: None (Preliminary result)   Collection  Time: 06/26/16  2:00 AM  Result Value Ref Range Status   Specimen Description BLOOD LEFT ANTECUBITAL  Final   Special Requests BOTTLES DRAWN AEROBIC AND ANAEROBIC 5CC  Final   Culture   Final    NO GROWTH 1 DAY Performed at [facility]    Report Status PENDING  Incomplete  Culture, blood (routine x 2) Call MD if unable to obtain prior to antibiotics being given     Status: None (Preliminary result)   Collection Time: 06/26/16  2:02 AM  Result Value Ref Range Status   Specimen Description BLOOD RIGHT FOREARM  Final   Special Requests BOTTLES DRAWN AEROBIC AND ANAEROBIC 5CC  Final   Culture   Final    NO GROWTH 1 DAY Performed at [facility]    Report Status PENDING  Incomplete  MRSA PCR Screening     Status: None   Collection Time: 06/26/16  3:44 AM  Result Value Ref Range Status   MRSA by PCR NEGATIVE NEGATIVE Final    Comment:        The GeneXpert MRSA Assay (FDA approved for NASAL specimens only), is one component of a comprehensive MRSA colonization surveillance program. It is not  intended to diagnose MRSA infection nor to guide or monitor treatment for MRSA infections.   Culture, sputum-assessment     Status: None   Collection Time: 06/26/16  2:34 PM  Result Value Ref Range Status   Specimen Description SPUTUM  Final   Special Requests NONE  Final   Sputum evaluation   Final    THIS SPECIMEN IS ACCEPTABLE. RESPIRATORY CULTURE REPORT TO FOLLOW.   Report Status 06/26/2016 FINAL  Final  Culture, respiratory (NON-Expectorated)     Status: None   Collection Time: 06/26/16  2:34 PM  Result Value Ref Range Status   Specimen Description SPUTUM  Final   Special Requests NONE  Final   Gram Stain   Final    MODERATE WBC PRESENT, PREDOMINANTLY PMN FEW GRAM POSITIVE COCCI IN PAIRS FEW GRAM NEGATIVE RODS FEW GRAM POSITIVE RODS    Culture   Final    Consistent with normal respiratory flora. Performed at [facility]    Report Status 06/28/2016  FINAL  Final         Radiology Studies: Dg Swallowing Func-speech Pathology  Result Date: 06/26/2016 Objective Swallowing Evaluation: Type of Study: MBS-Modified Barium Swallow Study Patient Details Name: TAEQUAN STOCKHAUSEN MRN: [id number] Date of Birth: 1956-01-17 Today's Date: 06/26/2016 Time: SLP Start Time (ACUTE ONLY): 1420-SLP Stop Time (ACUTE ONLY): 1440 SLP Time Calculation (min) (ACUTE ONLY): 20 min Past Medical History: Past Medical History: Diagnosis Date . Bone cancer ([place])  . COPD (chronic obstructive pulmonary disease) ([place])  . Diverticulitis  . Essential hypertension  . GERD (gastroesophageal reflux disease)  . Hiatal hernia  . Hyperplastic rectal polyp  . Lung cancer ([place])  . PBC (primary biliary cirrhosis)   Diagnosed 2006 . S/P colonoscopy 2009  Pancolonic diverticula, hyperplastic rectal polyps Past Surgical History: Past Surgical History: Procedure Laterality Date . Arm Surgery Left   ulnar nerve release . CHOLECYSTECTOMY   . COLONOSCOPY  2009  Pancolonic diverticula, hyperplastic rectal polyps . COLONOSCOPY [name] 03/24/2015  RMR: Single colonic polyp removed as described above. Pancolonic diverticulosis. Abnormal sigmoid colon consistent with stigmata of recent diverticulitis.  Marland Kitchen ESOPHAGOGASTRODUODENOSCOPY [name] 06/28/2015  RMR: mild erosive reflux/small HH . INGUINAL HERNIA REPAIR Right Feb 2012  Dr. Arnoldo Morale  . INGUINAL HERNIA REPAIR Left 11/12/2015  Procedure: HERNIA REPAIR INGUINAL ADULT WITH MESH;  Surgeon: Aviva Signs, MD;  Location: [facility];  Service: General;  Laterality: Left; . INSERTION OF MESH Left 11/12/2015  Procedure: INSERTION OF MESH;  Surgeon: Aviva Signs, MD;  Location: [facility];  Service: General;  Laterality: Left; . LIVER BIOPSY   . NASAL SEPTUM SURGERY   HPI: AKHIL PISCOPO a 60 y.o.malewith medical history significant of stage 4 adenocarcinoma of lung, COPD. Pt presents to the ED with sudden onset of severe SOB. CXR shows bilateral aspiration. Patient with c/o difficulty  swallowing and odynophagia beginning 10/5. S/p radiation treatment 03/2016.  No Data Recorded Assessment / Plan / Recommendation CHL IP CLINICAL IMPRESSIONS 06/26/2016 Therapy Diagnosis Mild pharyngeal phase dysphagia;Moderate pharyngeal phase dysphagia Clinical Impression Patient presents with a moderate pharyngeal phase dysphagia characterized by retention of barium in vallecula (moderate) and above CP (mild ) post swallow as well as mildly decreased laryngeal closure resulting in trace penetration during the swallow with thin liquids. Use of chin tuck assists to fully clear the pharynx of bolus as well as prevent penetration/aspiration. Unclear cause of deficits at this time as hyolaryngeal elevation and pharyngeal contraction all appear WFL and oral motor exam is unrevealing.  Question presence of pharyngo-esophageal candida as patient with c/o painful swallow. Note MD is initiating meds for possible candida. Will f/u at bedside for improvement and need for further testing depending on progress/changes in function.  Impact on safety and function Moderate aspiration risk;Mild aspiration risk   CHL IP TREATMENT RECOMMENDATION 06/26/2016 Treatment Recommendations Therapy as outlined in treatment plan below   Prognosis 06/26/2016 Prognosis for Safe Diet Advancement Fair Barriers to Reach Goals -- Barriers/Prognosis Comment -- CHL IP DIET RECOMMENDATION 06/26/2016 SLP Diet Recommendations Regular solids;Thin liquid Liquid Administration via Cup;Straw Medication Administration Whole meds with liquid Compensations Slow rate;Small sips/bites;Chin tuck Postural Changes Seated upright at 90 degrees   CHL IP OTHER RECOMMENDATIONS 06/26/2016 Recommended Consults -- Oral Care Recommendations Oral care BID Other Recommendations --   CHL IP FOLLOW UP RECOMMENDATIONS 06/26/2016 Follow up Recommendations Other (comment)   CHL IP FREQUENCY AND DURATION 06/26/2016 Speech Therapy Frequency (ACUTE ONLY) min 2x/week Treatment Duration 2 weeks       CHL IP ORAL PHASE 06/26/2016 Oral Phase WFL Oral - Pudding Teaspoon -- Oral - Pudding Cup -- Oral - Honey Teaspoon -- Oral - Honey Cup -- Oral - Nectar Teaspoon -- Oral - Nectar Cup -- Oral - Nectar Straw -- Oral - Thin Teaspoon -- Oral - Thin Cup -- Oral - Thin Straw -- Oral - Puree -- Oral - Mech Soft -- Oral - Regular -- Oral - Multi-Consistency -- Oral - Pill -- Oral Phase - Comment --  CHL IP PHARYNGEAL PHASE 06/26/2016 Pharyngeal Phase Impaired Pharyngeal- Pudding Teaspoon -- Pharyngeal -- Pharyngeal- Pudding Cup -- Pharyngeal -- Pharyngeal- Honey Teaspoon -- Pharyngeal -- Pharyngeal- Honey Cup -- Pharyngeal -- Pharyngeal- Nectar Teaspoon -- Pharyngeal -- Pharyngeal- Nectar Cup -- Pharyngeal -- Pharyngeal- Nectar Straw -- Pharyngeal -- Pharyngeal- Thin Teaspoon -- Pharyngeal -- Pharyngeal- Thin Cup Reduced tongue base retraction;Pharyngeal residue - valleculae;Pharyngeal residue - cp segment Pharyngeal -- Pharyngeal- Thin Straw Reduced tongue base retraction;Pharyngeal residue - valleculae;Pharyngeal residue - cp segment;Penetration/Aspiration during swallow Pharyngeal Material enters airway, remains ABOVE vocal cords then ejected out Pharyngeal- Puree Reduced tongue base retraction;Pharyngeal residue - valleculae Pharyngeal -- Pharyngeal- Mechanical Soft Reduced tongue base retraction;Pharyngeal residue - valleculae Pharyngeal -- Pharyngeal- Regular -- Pharyngeal -- Pharyngeal- Multi-consistency -- Pharyngeal -- Pharyngeal- Pill WFL Pharyngeal -- Pharyngeal Comment --  Gabriel Rainwater MA, CCC-SLP [phone] [place] Meryl 06/26/2016, 3:15 PM                   Scheduled Meds: . ampicillin-sulbactam (UNASYN) IV  3 g Intravenous Q6H  . DULoxetine  30 mg Oral Daily  . famotidine  40 mg Oral QHS  . [START ON 06/29/2016] fluconazole  100 mg Oral Daily  . folic acid  1 mg Oral Daily  . lactose free nutrition  1 Container Oral BID BM  . mouth rinse  15 mL Mouth Rinse BID  . mometasone-formoterol  2  puff Inhalation BID  . morphine  15 mg Oral Q12H  . multivitamin with minerals  1 tablet Oral Daily  . nystatin  5 mL Oral QID  . Obeticholic Acid  5 mg Oral Daily  . pantoprazole  40 mg Oral Q1200  . polyethylene glycol  34 g Oral BID  . potassium chloride  10 mEq Intravenous Q1 Hr x 4  . sucralfate  1 g Oral TID WC & HS  . tiotropium  18 mcg Inhalation Daily  . vancomycin  750 mg Intravenous Q12H   Continuous Infusions: . 0.9 %  NaCl with KCl 40 mEq / L       LOS: 2 days    Time spent: 33 minutes    Dron Tanna Furry, MD [facility] Pager [phone]  If 7PM-7AM, please contact night-coverage [email or web address] Password [email or web address] 06/28/2016, 12:04 PM

## 2016-06-28 NOTE — Progress Notes (Addendum)
Nutrition Follow-up  DOCUMENTATION CODES:   Severe malnutrition in context of acute illness/injury, Underweight  INTERVENTION:  - Continue Boost Plus BID. - Continue to encourage PO intakes of meals and supplements. - RD will continue to monitor for needs.  NUTRITION DIAGNOSIS:   Increased nutrient needs related to catabolic illness, cancer and cancer related treatments as evidenced by estimated needs. -revised.  GOAL:   Patient will meet greater than or equal to 90% of their needs -likely met on average.  MONITOR:   PO intake, Supplement acceptance, Weight trends, Labs, I & O's  ASSESSMENT:   60 y.o. male with medical history significant of stage 4 adenocarcinoma of lung, COPD.  Pt presents to the ED with sudden onset of severe SOB.  He was resting in a chair at home and may have "dozed off" and he had sudden onset of severe SOB.  This occurred about 1.5 hrs PTA earlier this evening.  Before then he had been feeling well.  10/11 MBS completed on 10/9 after RD visit that AM. Pt on Regular diet with thin liquids since MBS; no intakes documented since that time. Pt states that for breakfast today he had [place] toast, breakfast potatoes, orange juice, and coffee. He states his appetite has been good over the past 2 days. No indicate possible esophagitis. He denies chewing or swallowing difficulties, but states that every time he swallows he feels a tightness/pressure in lower sternal area. SLP in to work with pt after RD left the room.  Pt has been drinking Boost and likes it to be cold; prior to RD entering pt's room, RN had reported going to get a cold Boost for pt. No new weight since previous visit. Physical assessment shows severe muscle and fat wasting to upper body; lower body not assessed at this time.   Likely meeting at least minimal estimated nutrition needs at this time. Pt denies nutrition-related questions or concerns at this time.  Medications reviewed; 40 mg oral  Pepcid/day, 1 mg oral folic acid/day, MEDLINE mouth rinse BID, daily multivitamin with minerals, PRN IV Zofran, 40 mg oral Protonix/day, 34 g Miralax BID, 10 mEq IV KCl x4 runs today, 1 g Carafate QID.  Labs reviewed; Ca: 8.2 mg/dL. IVF: NS-40 mEq KCl @ 75 mL/hr.   ADDENDUM: Pt denies any taste alterations other than sweet tea tasting like apple juice mixed with tea.    10/9 - Pt has been NPO since admission.  - Pt drowsy during RD visit; he does engage in conversation to some extent but wife, who is at bedside, provides the majority of information.  - Last chemo was on Tuesday (10/3). Since Thursday (10/5) he has been experiencing pain with swallowing which is new for him.  - Denies any nausea or abdominal pain now or since last chemo treatment.  - PTA appetite and intakes are variable. A "good day" consists of 2 large meals. A "bad day" consists of only eating one to two small meals such as a bowl of soup, a cup of yogurt, half of a peanut butter and jelly or chicken salad sandwich.  - PTA he was consuming 1-2 bottles of chocolate Boost per day.  - Pt does not like Ensure and only likes chocolate flavor of Boost.  - Will need to ask about chewing and swallowing ability once diet is advanced.  - Pt denies any recent changes in bowel habits.  - Physical assessment deferred at this time with respect to pt's comfort. Able to visualize severe  muscle wasting to upper body.  - Pt reports that prior to starting treatment his UBW was 150 lbs.  - Per chart review, pt has lost 17 lbs (12% body weight) in the past 3 months which is significant for time frame.  IVF: NS @ 100 mL/hr.     Diet Order:  Diet regular Room service appropriate? Yes; Fluid consistency: Thin  Skin:  Reviewed, no issues  Last BM:  10/10  Height:   Ht Readings from Last 1 Encounters:  06/26/16 '6\' 1"'  (1.854 m)    Weight:   Wt Readings from Last 1 Encounters:  06/26/16 125 lb 3.5 oz (56.8 kg)    Ideal Body  Weight:  83.64 kg  BMI:  Body mass index is 16.52 kg/m.  Estimated Nutritional Needs:   Kcal:  1875-2100 (33-37 kcal/kg)  Protein:  80-90 grams (1.4-1.6 grams/kg)  Fluid:  >/= 1.9 L/day  EDUCATION NEEDS:   No education needs identified at this time    Jarome Matin, MS, RD, LDN Inpatient Clinical Dietitian Pager # [phone] After hours/weekend pager # [phone]

## 2016-06-28 NOTE — Progress Notes (Signed)
Speech Language Pathology Treatment: Dysphagia  Patient Details Name: SEYED HEFFLEY MRN: [id number] DOB: 1955-12-21 Today's Date: 06/28/2016 Time: 5702-2026 SLP Time Calculation (min) (ACUTE ONLY): 17 min  Assessment / Plan / Recommendation Clinical Impression  Skilled intervention included educating pt to findings of MBS, clinical reasoning for compensation strategy and exercises to maximize epiglottic deflection/tongue base retraction.  Pt recalled chin tuck posture with Mod I and did not desire to review MBS when SLP offered as he recalled the study/images.  Using teach back, education was also completed re: necessity to maintain lingual base strength/expectoration/"hocking" strength and cough ability to facilitate pulmonary clearance.    In addition, pt admits he did not sense pharyngeal/vallecular residuals during MBS therefore reinforced importance of chin tuck posture.  Pt denies significant issues with dysphagia nor coughing/choking with intake before admit but does admit to some weakness currently.   SlP to sign off as all education completed for dysphagia improvement and mitigation.  Thanks for this consult.    HPI HPI: EDWING FIGLEY a 60 y.o.malewith medical history significant of stage 4 adenocarcinoma of lung, COPD. Pt presents to the ED with sudden onset of severe SOB. CXR shows bilateral aspiration. Patient with c/o difficulty swallowing and odynophagia beginning 10/5. S/p radiation treatment 03/2016.       SLP Plan  All goals met     Recommendations  Diet recommendations: Regular;Thin liquid Liquids provided via: Cup;Straw Medication Administration: Whole meds with liquid Supervision: Patient able to self feed Compensations: Slow rate;Small sips/bites;Chin tuck Postural Changes and/or Swallow Maneuvers: Seated upright 90 degrees;Upright 30-60 min after meal                Oral Care Recommendations: Oral care QID Follow up Recommendations: None Plan: All goals  met       Har[place], [place], [place] [facility] [place] [phone]

## 2016-06-29 ENCOUNTER — Telehealth (HOSPITAL_BASED_OUTPATIENT_CLINIC_OR_DEPARTMENT_OTHER): Payer: Self-pay | Admitting: Emergency Medicine

## 2016-06-29 LAB — CBC WITH DIFFERENTIAL/PLATELET
Basophils Absolute: 0 10*3/uL (ref 0.0–0.1)
Basophils Relative: 0 %
EOS PCT: 8 %
Eosinophils Absolute: 0 10*3/uL (ref 0.0–0.7)
HEMATOCRIT: 25 % — AB (ref 39.0–52.0)
Hemoglobin: 8.3 g/dL — ABNORMAL LOW (ref 13.0–17.0)
LYMPHS PCT: 46 %
Lymphs Abs: 0.2 10*3/uL — ABNORMAL LOW (ref 0.7–4.0)
MCH: 28.8 pg (ref 26.0–34.0)
MCHC: 33.2 g/dL (ref 30.0–36.0)
MCV: 86.8 fL (ref 78.0–100.0)
MONO ABS: 0 10*3/uL — AB (ref 0.1–1.0)
MONOS PCT: 6 %
NEUTROS PCT: 40 %
Neutro Abs: 0.3 10*3/uL — ABNORMAL LOW (ref 1.7–7.7)
PLATELETS: 31 10*3/uL — AB (ref 150–400)
RBC: 2.88 MIL/uL — AB (ref 4.22–5.81)
RDW: 16.3 % — ABNORMAL HIGH (ref 11.5–15.5)
WBC: 0.5 10*3/uL — AB (ref 4.0–10.5)

## 2016-06-29 LAB — BASIC METABOLIC PANEL
ANION GAP: 4 — AB (ref 5–15)
BUN: 7 mg/dL (ref 6–20)
CO2: 28 mmol/L (ref 22–32)
Calcium: 7.6 mg/dL — ABNORMAL LOW (ref 8.9–10.3)
Chloride: 106 mmol/L (ref 101–111)
Creatinine, Ser: 0.57 mg/dL — ABNORMAL LOW (ref 0.61–1.24)
GFR calc Af Amer: >60 mL/min (ref >60)
GLUCOSE: 113 mg/dL — AB (ref 65–99)
POTASSIUM: 3.4 mmol/L — AB (ref 3.5–5.1)
Sodium: 138 mmol/L (ref 135–145)

## 2016-06-29 LAB — MAGNESIUM: Magnesium: 1.9 mg/dL (ref 1.7–2.4)

## 2016-06-29 MED ORDER — POTASSIUM CHLORIDE 10 MEQ/100ML IV SOLN
10.0000 meq | INTRAVENOUS | Status: AC
  Administered 2016-06-29 (×3): 10 meq via INTRAVENOUS
  Filled 2016-06-29 (×3): qty 100

## 2016-06-29 MED ORDER — SULFAMETHOXAZOLE-TRIMETHOPRIM 800-160 MG PO TABS
1.0000 | ORAL_TABLET | ORAL | Status: DC
  Administered 2016-06-30 – 2016-07-05 (×3): 1 via ORAL
  Filled 2016-06-29 (×3): qty 1

## 2016-06-29 MED ORDER — POTASSIUM CHLORIDE CRYS ER 20 MEQ PO TBCR
40.0000 meq | EXTENDED_RELEASE_TABLET | Freq: Every day | ORAL | Status: DC
  Administered 2016-06-30: 40 meq via ORAL
  Filled 2016-06-29 (×2): qty 2

## 2016-06-29 NOTE — Progress Notes (Signed)
Pharmacy Antibiotic Note  Jeffrey Gutierrez is a 60 y.o. male admitted on 06/25/2016 with aspiration pneumonia.  PMH significant for Stage IV lung cancer, undergoing chemo with pancytopenia; COPD, and new dx HIV.  Pharmacy was consulted for Vancomycin & Unasyn dosing, which has been narrowed to Unasyn.  Renal function has been stable. Patient afebrile.  ID has been consulted.   Plan: - Continue Unasyn 3gm IV q6h - No further dose adjustments anticipated.  Pharmacy will sign off note-writing & follow  peripherally.  Please re-consult if needed.   Height: '6\' 1"'$  (185.4 cm) Weight: 125 lb 3.5 oz (56.8 kg) IBW/kg (Calculated) : 79.9  Temp (24hrs), Avg:98.1 F (36.7 C), Min:98.1 F (36.7 C), Max:98.2 F (36.8 C)   Recent Labs Lab 06/25/16 2123 06/26/16 0033 06/27/16 0312 06/28/16 0900 06/29/16 0350  WBC 3.6*  --  1.2* 0.7* 0.5*  CREATININE 1.02  --  0.77 0.70 0.57*  LATICACIDVEN  --  0.60  --   --   --     Estimated Creatinine Clearance: 80.9 mL/min (by C-G formula based on SCr of 0.57 mg/dL (L)).    Allergies  Allergen Reactions  . Dihydrocodeine Anaphylaxis  . Hydrocodone Anaphylaxis and Other (See Comments)    Throat closes up  Patient states PCP gave 11/2015 and he has been tolerating it.  . Doxycycline Nausea And Vomiting  . Linzess [Linaclotide] Diarrhea    Antimicrobials this admission:  10/9 Vanc >> 10/11 10/9 Cefepime x 1 10/9 Unasyn >> 10/10 Fluconazole >> 10/12 Descovy/Tivicay>>  Dose adjustments this admission:   Microbiology results:  10/9 BCx: NGTD 10/9 Sputum: NG-F 10/9 MRSA PCR: negative  Thank you for allowing pharmacy to be a part of this patient's care.  Netta Cedars, PharmD, BCPS Pager: [phone] 06/29/2016 7:32 AM

## 2016-06-29 NOTE — Progress Notes (Signed)
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 Patient developed neutropenia and has HIV 1 antibody positive. Oncology and infectious disease consult called today.  Assessment & Plan:   #Acute respiratory failure with hypoxia secondary due to aspiration pneumonia versus HCAP: -continue IV unasyn. Discontinued IV vancomycin by ID. -Follow-up culture results.still NGTD -Continue bronchodilators, respiratory therapy and wean oxygen as tolerated. -Currently on 2-3 L of oxygen via nasal cannula to maintain oxygen saturation.  #Pancytopenia/Neutropenia due to chemotherapy: -on Granix as per Hematologist. Monitor WBC count.  -continue neutropenic precaution.   #Metastatic stage IV adenocarcinoma of lung: -Undergoing chemotherapy at [facility]. Follow-up oncology for further evaluation and outpatient follow-up at [facility].  # Newly diagnosed HIV/ HIV 1 antibody positive:pt is unaware of this diagnosis before. Denied any IV drug use or unprotected sexual interaction. -starting anti-viral medication by Id.  -follow up viral load. Cd4 etc as per ID. -further management as per ID , consult appreciated.   #Severe hypokalemia likely due to decreased oral intake: K 3.4 today. Continue IV and oral KCL. Mg acceptable. Monitor labs.  #Presumed esophagitis, likely candida infection: Continue nystatin and Diflucan. Follow-up infectious disease.   #Severe protein calorie malnutrition likely in the setting of malignancy: Dietary referral and oral supplement.  #History of COPD and tobacco use: Continue bronchodilators and respiratory therapy.  #History of GERD:  Continue Pepcid  #Chronic pain management: Continue current dose of morphine. Pain seems adequately controlled. Continue to monitor.  DVT prophylaxis: SCD and early embolism. Avoid anticoagulation because of pancytopenia. Code Status: Full code Family Communication: No family present at bedside Disposition Plan: Likely discharge home with home care in 2-3 days.   Consultants:   ID and Hem/oncology  Procedures: no  Antimicrobials:  Unasyn since 06/26/2016  Subjective: Patient was seen and examined at bedside. Reported shortness of breath and cough is better. Denied fever, chills, headache, nausea, vomiting, chest pain. Denies abdominal pain. Objective: Vitals:   06/28/16 1422 06/28/16 2018 06/29/16 0535 06/29/16 1005  BP: 130/74 (!) 139/91 (!) 143/82   Pulse: 85 84 93   Resp: '16 16 17   '$ Temp: 98.1 F (36.7 C) 98.1 F (36.7 C) 98.2 F (36.8 C)   TempSrc: Oral Oral Oral   SpO2: 98% 95% 98% 96%  Weight:      Height:        Intake/Output Summary (Last 24 hours) at 06/29/16 1340 Last data filed at 06/29/16 0944  Gross per 24 hour  Intake          2108.75 ml  Output                0 ml  Net          2108.75 ml   Filed Weights   06/25/16 2016 06/26/16 0400  Weight: 60.8 kg (134 lb) 56.8 kg (125 lb 3.5 oz)    Examination:  General exam: Thin male lying comfortably on bed not in distress Respiratory system: Bibasal inspiratory crackles, no wheezing. Cardiovascular system: Regular rate rhythm, S1-S2 normal. No lower extremity edema. Gastrointestinal system: Abdomen soft, nontender, nondistended. Bowel sound positive. Central nervous system: Alert, awake and oriented. No focal neurological deficit. Extremities: Symmetric 5  x 5 power. Skin: No rashes, lesions or ulcers Psychiatry: Judgement and insight appear normal. Mood & affect appropriate.     Data Reviewed: I have personally reviewed following labs and imaging studies  CBC:  Recent Labs Lab 06/25/16 2123  06/27/16 0312 06/28/16 0900 06/29/16 0350  WBC 3.6* 1.2* 0.7* 0.5*  NEUTROABS 2.6 0.9* 0.3* 0.3*  HGB 11.4* 10.1* 9.1* 8.3*  HCT 35.8* 32.0* 28.2* 25.0*  MCV 91.8 92.0 90.7 86.8  PLT 89* 70* 49* 31*   Basic Metabolic Panel:  Recent Labs Lab 06/25/16 2123 06/27/16 0312 06/28/16 0900 06/29/16 0350  NA 139 139 139 138  K 4.1 3.5 2.8* 3.4*  CL 97* 103 107 106  CO2 33* '31 26 28  '$ GLUCOSE 105* 112* 109* 113*  BUN 28* '14 9 7  '$ CREATININE 1.02 0.77 0.70 0.57*  CALCIUM 9.0 8.2* 7.7* 7.6*  MG  --  1.7  --  1.9   GFR: Estimated Creatinine Clearance: 80.9 mL/min (by C-G formula based on SCr of 0.57 mg/dL (L)). Liver Function Tests: No results for input(s): AST, ALT, ALKPHOS, BILITOT, PROT, ALBUMIN in the last 168 hours. No results for input(s): LIPASE, AMYLASE in the last 168 hours. No results for input(s): AMMONIA in the last 168 hours. Coagulation Profile: No results for input(s): INR, PROTIME in the last 168 hours. Cardiac Enzymes: No results for input(s): CKTOTAL, CKMB, CKMBINDEX, TROPONINI in the last 168 hours. BNP (last 3 results) No results for input(s): PROBNP in the last 8760 hours. HbA1C: No results for input(s): HGBA1C in the last 72 hours. CBG:  Recent Labs Lab 06/27/16 2211 06/28/16 0735 06/28/16 1130 06/28/16 1654  GLUCAP 123* 85 114* 118*   Lipid Profile: No results for input(s): CHOL, HDL, LDLCALC, TRIG, CHOLHDL, LDLDIRECT in the last 72 hours. Thyroid Function Tests: No results for input(s): TSH, T4TOTAL, FREET4, T3FREE, THYROIDAB in the last 72 hours. Anemia Panel: No results for input(s): VITAMINB12, FOLATE, FERRITIN, TIBC, IRON, RETICCTPCT in the last 72 hours. Sepsis Labs:  Recent Labs Lab 06/26/16 0033  LATICACIDVEN 0.60    Recent Results (from the past 240 hour(s))  Culture, blood (routine x 2) Call MD if unable to obtain prior to antibiotics being given     Status: None (Preliminary result)   Collection Time: 06/26/16  2:00 AM  Result  Value Ref Range Status   Specimen Description BLOOD LEFT ANTECUBITAL  Final   Special Requests BOTTLES DRAWN AEROBIC AND ANAEROBIC 5CC  Final   Culture   Final    NO GROWTH 3 DAYS Performed at [facility]    Report Status PENDING  Incomplete  Culture, blood (routine x 2) Call MD if unable to obtain prior to antibiotics being given     Status: None (Preliminary result)   Collection Time: 06/26/16  2:02 AM  Result Value Ref Range Status   Specimen Description BLOOD RIGHT FOREARM  Final   Special Requests BOTTLES DRAWN AEROBIC AND ANAEROBIC 5CC  Final   Culture   Final    NO GROWTH 3 DAYS Performed at [facility]    Report Status PENDING  Incomplete  MRSA PCR Screening     Status: None   Collection Time: 06/26/16  3:44 AM  Result Value Ref Range Status   MRSA by PCR NEGATIVE NEGATIVE Final    Comment:        The GeneXpert MRSA Assay (FDA approved for NASAL specimens only), is one component of a comprehensive MRSA colonization surveillance program. It  is not intended to diagnose MRSA infection nor to guide or monitor treatment for MRSA infections.   Culture, sputum-assessment     Status: None   Collection Time: 06/26/16  2:34 PM  Result Value Ref Range Status   Specimen Description SPUTUM  Final   Special Requests NONE  Final   Sputum evaluation   Final    THIS SPECIMEN IS ACCEPTABLE. RESPIRATORY CULTURE REPORT TO FOLLOW.   Report Status 06/26/2016 FINAL  Final  Culture, respiratory (NON-Expectorated)     Status: None   Collection Time: 06/26/16  2:34 PM  Result Value Ref Range Status   Specimen Description SPUTUM  Final   Special Requests NONE  Final   Gram Stain   Final    MODERATE WBC PRESENT, PREDOMINANTLY PMN FEW GRAM POSITIVE COCCI IN PAIRS FEW GRAM NEGATIVE RODS FEW GRAM POSITIVE RODS    Culture   Final    Consistent with normal respiratory flora. Performed at [facility]    Report Status 06/28/2016 FINAL  Final          Radiology Studies: No results found.      Scheduled Meds: . ampicillin-sulbactam (UNASYN) IV  3 g Intravenous Q6H  . dolutegravir  50 mg Oral Daily  . DULoxetine  30 mg Oral Daily  . emtricitabine-tenofovir AF  1 tablet Oral Daily  . famotidine  40 mg Oral QHS  . fluconazole  100 mg Oral Daily  . folic acid  1 mg Oral Daily  . lactose free nutrition  1 Container Oral BID BM  . mouth rinse  15 mL Mouth Rinse BID  . mometasone-formoterol  2 puff Inhalation BID  . morphine  15 mg Oral Q12H  . multivitamin with minerals  1 tablet Oral Daily  . nystatin  5 mL Oral QID  . Obeticholic Acid  5 mg Oral Daily  . pantoprazole  40 mg Oral Q1200  . polyethylene glycol  34 g Oral BID  . sucralfate  1 g Oral TID WC & HS  . Tbo-filgastrim (GRANIX) SQ  300 mcg Subcutaneous q1800  . tiotropium  18 mcg Inhalation Daily   Continuous Infusions: . 0.9 % NaCl with KCl 40 mEq / L 75 mL/hr (06/29/16 0115)     LOS: 3 days    Time spent: 28 minutes    Diego Delancey Tanna Furry, MD [facility] Pager [phone]  If 7PM-7AM, please contact night-coverage [email or web address] Password [email or web address] 06/29/2016, 1:40 PM

## 2016-06-30 DIAGNOSIS — C782 Secondary malignant neoplasm of pleura: Secondary | ICD-10-CM

## 2016-06-30 DIAGNOSIS — C779 Secondary and unspecified malignant neoplasm of lymph node, unspecified: Secondary | ICD-10-CM

## 2016-06-30 DIAGNOSIS — K830 Cholangitis: Secondary | ICD-10-CM

## 2016-06-30 LAB — CBC WITH DIFFERENTIAL/PLATELET
BASOS PCT: 0 %
Basophils Absolute: 0 10*3/uL (ref 0.0–0.1)
Eosinophils Absolute: 0 10*3/uL (ref 0.0–0.7)
Eosinophils Relative: 5 %
HEMATOCRIT: 24.6 % — AB (ref 39.0–52.0)
Hemoglobin: 8.1 g/dL — ABNORMAL LOW (ref 13.0–17.0)
LYMPHS ABS: 0.3 10*3/uL — AB (ref 0.7–4.0)
Lymphocytes Relative: 70 %
MCH: 29.5 pg (ref 26.0–34.0)
MCHC: 32.9 g/dL (ref 30.0–36.0)
MCV: 89.5 fL (ref 78.0–100.0)
MONO ABS: 0 10*3/uL — AB (ref 0.1–1.0)
MONOS PCT: 5 %
NEUTROS ABS: 0.1 10*3/uL — AB (ref 1.7–7.7)
Neutrophils Relative %: 21 %
Platelets: 27 10*3/uL — CL (ref 150–400)
RBC: 2.75 MIL/uL — ABNORMAL LOW (ref 4.22–5.81)
RDW: 16.4 % — AB (ref 11.5–15.5)
WBC: 0.4 10*3/uL — CL (ref 4.0–10.5)

## 2016-06-30 LAB — T-HELPER CELLS (CD4) COUNT (NOT AT ARMC)
CD4 T CELL HELPER: 7 % — AB (ref 33–55)
CD4 T Cell Abs: 40 /uL — ABNORMAL LOW (ref 400–2700)

## 2016-06-30 LAB — BASIC METABOLIC PANEL
Anion gap: 3 — ABNORMAL LOW (ref 5–15)
BUN: 9 mg/dL (ref 6–20)
CALCIUM: 7.9 mg/dL — AB (ref 8.9–10.3)
CHLORIDE: 106 mmol/L (ref 101–111)
CO2: 29 mmol/L (ref 22–32)
CREATININE: 0.66 mg/dL (ref 0.61–1.24)
GFR calc Af Amer: >60 mL/min (ref >60)
GFR calc non Af Amer: >60 mL/min (ref >60)
GLUCOSE: 120 mg/dL — AB (ref 65–99)
Potassium: 3.8 mmol/L (ref 3.5–5.1)
Sodium: 138 mmol/L (ref 135–145)

## 2016-06-30 LAB — MAGNESIUM: Magnesium: 1.6 mg/dL — ABNORMAL LOW (ref 1.7–2.4)

## 2016-06-30 MED ORDER — MAGNESIUM SULFATE 2 GM/50ML IV SOLN
2.0000 g | Freq: Once | INTRAVENOUS | Status: AC
  Administered 2016-06-30: 2 g via INTRAVENOUS
  Filled 2016-06-30: qty 50

## 2016-06-30 MED ORDER — AMOXICILLIN-POT CLAVULANATE 875-125 MG PO TABS
1.0000 | ORAL_TABLET | Freq: Two times a day (BID) | ORAL | Status: DC
  Administered 2016-06-30 – 2016-07-03 (×6): 1 via ORAL
  Filled 2016-06-30 (×6): qty 1

## 2016-06-30 MED ORDER — TBO-FILGRASTIM 480 MCG/0.8ML ~~LOC~~ SOSY
480.0000 ug | PREFILLED_SYRINGE | Freq: Every day | SUBCUTANEOUS | Status: DC
  Administered 2016-07-01 – 2016-07-05 (×5): 480 ug via SUBCUTANEOUS
  Filled 2016-06-30 (×8): qty 0.8

## 2016-06-30 NOTE — Progress Notes (Signed)
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 Patient developed neutropenia and has HIV 1 antibody positive. Oncology and infectious disease consult called today.  Assessment & Plan:   #Acute respiratory failure with hypoxia secondary due to aspiration pneumonia versus HCAP: -clinically improving. Currently on room air with acceptable oxygen saturation. No shortness of breath or cough. -Discontinued IV Unasyn and changed to oral Augmentin. -Follow-up culture results.still NGTD -Continue bronchodilators, respiratory therapy and wean oxygen as tolerated.  #Pancytopenia/Neutropenia due to chemotherapy and HIV -on Granix as per Hematologist. WBC count of 0.5 daily.  -Continue to monitor closely and neutropenic precautions.   #Metastatic stage IV adenocarcinoma of lung: -Undergoing chemotherapy at [facility]. Follow-up oncology for further evaluation and outpatient follow-up at [facility].  # Newly diagnosed HIV/ HIV 1 antibody positive:pt is unaware of this diagnosis before. Denied any IV drug use or unprotected sexual interaction. -started anti-viral, Bactrim by ID .  -follow up viral load and CD4 count. CD4 count will be low because of neutropenia. Needs outpatient follow-up with infectious disease team for further management.  #Severe hypokalemia likely due to decreased oral intake: Serum potassium level 3.8 today. Continue with oral and IV potassium repletion. Patient has hypomagnesemia, I will order 2 g of magnesium sulfate. Repeat lab in am.   #Presumed esophagitis, likely candida infection: Continue nystatin and Diflucan. Follow-up infectious  disease.   #Severe protein calorie malnutrition likely in the setting of malignancy: Dietary referral and oral supplement.  #History of COPD and tobacco use: Continue bronchodilators and respiratory therapy.  #History of GERD: Continue Pepcid  #Chronic pain management: Continue current dose of morphine. Pain seems adequately controlled. Continue to monitor.  DVT prophylaxis: SCD and early embolism. Avoid anticoagulation because of pancytopenia. Code Status: Full code Family Communication: No family present at bedside Disposition Plan: Likely discharge home with home care in 2-3 days.   Consultants:   ID and Hem/oncology  Procedures: no  Antimicrobials:  Unasyn since 06/26/2016  Subjective: Patient was seen and examined at bedside. Patient reported feeling much better. He denied fever, chills, headache, chest pain, shortness of breath, cough. No nausea vomiting or abdominal pain. Denies diarrhea. Denied dysuria or urgency.  Objective: Vitals:   06/29/16 2114 06/29/16 2144 06/30/16 0443 06/30/16 0900  BP:  (!) 154/94 130/81   Pulse:  98 88   Resp:  19 18   Temp:  98.6 F (37 C) 98.5 F (36.9 C)   TempSrc:  Oral Oral   SpO2: 96% 98% 93% 96%  Weight:      Height:        Intake/Output Summary (Last 24 hours) at 06/30/16 1120 Last data filed at 06/29/16 1436  Gross per 24 hour  Intake               10 ml  Output                0 ml  Net               10 ml   Filed Weights   06/25/16 2016 06/26/16 0400  Weight: 60.8 kg (134 lb) 56.8 kg (125 lb 3.5 oz)    Examination:  General  exam: Not in distress Respiratory system: Clear to auscultation bilateral, no wheezing or crackles appreciated. Cardiovascular system: Regular rate rhythm, S1-S2 normal. No bilateral lower extremity edema. Gastrointestinal system: Abdomen is soft, nontender and not distended. Bowel sound positive. Central nervous system: Alert, awake and oriented. No focal neurological deficit Extremities:  Symmetric 5 x 5 power. Skin: No rashes, lesions or ulcers Psychiatry: Judgement and insight appear normal. Mood & affect appropriate.     Data Reviewed: I have personally reviewed following labs and imaging studies  CBC:  Recent Labs Lab 06/25/16 2123 06/27/16 0312 06/28/16 0900 06/29/16 0350 06/30/16 0400  WBC 3.6* 1.2* 0.7* 0.5* 0.4*  NEUTROABS 2.6 0.9* 0.3* 0.3* 0.1*  HGB 11.4* 10.1* 9.1* 8.3* 8.1*  HCT 35.8* 32.0* 28.2* 25.0* 24.6*  MCV 91.8 92.0 90.7 86.8 89.5  PLT 89* 70* 49* 31* 27*   Basic Metabolic Panel:  Recent Labs Lab 06/25/16 2123 06/27/16 0312 06/28/16 0900 06/29/16 0350 06/30/16 0400  NA 139 139 139 138 138  K 4.1 3.5 2.8* 3.4* 3.8  CL 97* 103 107 106 106  CO2 33* '31 26 28 29  '$ GLUCOSE 105* 112* 109* 113* 120*  BUN 28* '14 9 7 9  '$ CREATININE 1.02 0.77 0.70 0.57* 0.66  CALCIUM 9.0 8.2* 7.7* 7.6* 7.9*  MG  --  1.7  --  1.9 1.6*   GFR: Estimated Creatinine Clearance: 80.9 mL/min (by C-G formula based on SCr of 0.66 mg/dL). Liver Function Tests: No results for input(s): AST, ALT, ALKPHOS, BILITOT, PROT, ALBUMIN in the last 168 hours. No results for input(s): LIPASE, AMYLASE in the last 168 hours. No results for input(s): AMMONIA in the last 168 hours. Coagulation Profile: No results for input(s): INR, PROTIME in the last 168 hours. Cardiac Enzymes: No results for input(s): CKTOTAL, CKMB, CKMBINDEX, TROPONINI in the last 168 hours. BNP (last 3 results) No results for input(s): PROBNP in the last 8760 hours. HbA1C: No results for input(s): HGBA1C in the last 72 hours. CBG:  Recent Labs Lab 06/27/16 2211 06/28/16 0735 06/28/16 1130 06/28/16 1654  GLUCAP 123* 85 114* 118*   Lipid Profile: No results for input(s): CHOL, HDL, LDLCALC, TRIG, CHOLHDL, LDLDIRECT in the last 72 hours. Thyroid Function Tests: No results for input(s): TSH, T4TOTAL, FREET4, T3FREE, THYROIDAB in the last 72 hours. Anemia Panel: No results for input(s): VITAMINB12,  FOLATE, FERRITIN, TIBC, IRON, RETICCTPCT in the last 72 hours. Sepsis Labs:  Recent Labs Lab 06/26/16 0033  LATICACIDVEN 0.60    Recent Results (from the past 240 hour(s))  Culture, blood (routine x 2) Call MD if unable to obtain prior to antibiotics being given     Status: None (Preliminary result)   Collection Time: 06/26/16  2:00 AM  Result Value Ref Range Status   Specimen Description BLOOD LEFT ANTECUBITAL  Final   Special Requests BOTTLES DRAWN AEROBIC AND ANAEROBIC 5CC  Final   Culture   Final    NO GROWTH 3 DAYS Performed at [facility]    Report Status PENDING  Incomplete  Culture, blood (routine x 2) Call MD if unable to obtain prior to antibiotics being given     Status: None (Preliminary result)   Collection Time: 06/26/16  2:02 AM  Result Value Ref Range Status   Specimen Description BLOOD RIGHT FOREARM  Final   Special Requests BOTTLES DRAWN AEROBIC AND ANAEROBIC 5CC  Final   Culture   Final    NO GROWTH 3 DAYS Performed at [facility]  Report Status PENDING  Incomplete  MRSA PCR Screening     Status: None   Collection Time: 06/26/16  3:44 AM  Result Value Ref Range Status   MRSA by PCR NEGATIVE NEGATIVE Final    Comment:        The GeneXpert MRSA Assay (FDA approved for NASAL specimens only), is one component of a comprehensive MRSA colonization surveillance program. It is not intended to diagnose MRSA infection nor to guide or monitor treatment for MRSA infections.   Culture, sputum-assessment     Status: None   Collection Time: 06/26/16  2:34 PM  Result Value Ref Range Status   Specimen Description SPUTUM  Final   Special Requests NONE  Final   Sputum evaluation   Final    THIS SPECIMEN IS ACCEPTABLE. RESPIRATORY CULTURE REPORT TO FOLLOW.   Report Status 06/26/2016 FINAL  Final  Culture, respiratory (NON-Expectorated)     Status: None   Collection Time: 06/26/16  2:34 PM  Result Value Ref Range Status   Specimen Description  SPUTUM  Final   Special Requests NONE  Final   Gram Stain   Final    MODERATE WBC PRESENT, PREDOMINANTLY PMN FEW GRAM POSITIVE COCCI IN PAIRS FEW GRAM NEGATIVE RODS FEW GRAM POSITIVE RODS    Culture   Final    Consistent with normal respiratory flora. Performed at [facility]    Report Status 06/28/2016 FINAL  Final         Radiology Studies: No results found.      Scheduled Meds: . amoxicillin-clavulanate  1 tablet Oral Q12H  . dolutegravir  50 mg Oral Daily  . DULoxetine  30 mg Oral Daily  . emtricitabine-tenofovir AF  1 tablet Oral Daily  . famotidine  40 mg Oral QHS  . fluconazole  100 mg Oral Daily  . folic acid  1 mg Oral Daily  . lactose free nutrition  1 Container Oral BID BM  . mouth rinse  15 mL Mouth Rinse BID  . mometasone-formoterol  2 puff Inhalation BID  . morphine  15 mg Oral Q12H  . multivitamin with minerals  1 tablet Oral Daily  . nystatin  5 mL Oral QID  . Obeticholic Acid  5 mg Oral Daily  . pantoprazole  40 mg Oral Q1200  . polyethylene glycol  34 g Oral BID  . potassium chloride  40 mEq Oral Daily  . sucralfate  1 g Oral TID WC & HS  . sulfamethoxazole-trimethoprim  1 tablet Oral Once per day on Mon Wed Fri  . Tbo-filgastrim (GRANIX) SQ  300 mcg Subcutaneous q1800  . tiotropium  18 mcg Inhalation Daily   Continuous Infusions: . 0.9 % NaCl with KCl 40 mEq / L 75 mL/hr (06/30/16 1041)     LOS: 4 days    Time spent: 25 minutes    Manish Ruggiero Tanna Furry, MD [facility] Pager [phone]  If 7PM-7AM, please contact night-coverage [email or web address] Password [email or web address] 06/30/2016, 11:20 AM

## 2016-06-30 NOTE — Progress Notes (Signed)
Jeffrey Gutierrez   DOB:Oct 19, 1955   QG#:[id number]   RFX#:[id number]  Oncology follow-up  Subjective: Patient feels better overall, afebrile. His neutropenia and thrombocytopenia has been getting worse since his admission. No bleeding.   Objective:  Vitals:   06/30/16 1324 06/30/16 2206  BP: 119/75 134/86  Pulse: 87 89  Resp: 18 18  Temp: 97.9 F (36.6 C) 98.1 F (36.7 C)    Body mass index is 16.52 kg/m.  Intake/Output Summary (Last 24 hours) at 06/30/16 2345 Last data filed at 06/30/16 2206  Gross per 24 hour  Intake              120 ml  Output                0 ml  Net              120 ml     Sclerae unicteric  Oropharynx clear  No peripheral adenopathy  Lungs clear -- no rales or rhonchi  Heart regular rate and rhythm  Abdomen benign  MSK no focal spinal tenderness, no peripheral edema  Neuro nonfocal    CBG (last 3)   Recent Labs  06/28/16 0735 06/28/16 1130 06/28/16 1654  GLUCAP 85 114* 118*     Labs:  CBC Latest Ref Rng & Units 06/30/2016 06/29/2016 06/28/2016  WBC 4.0 - 10.5 K/uL 0.4(LL) 0.5(LL) 0.7(LL)  Hemoglobin 13.0 - 17.0 g/dL 8.1(L) 8.3(L) 9.1(L)  Hematocrit 39.0 - 52.0 % 24.6(L) 25.0(L) 28.2(L)  Platelets 150 - 400 K/uL 27(LL) 31(L) 49(L)    CMP Latest Ref Rng & Units 06/30/2016 06/29/2016 06/28/2016  Glucose 65 - 99 mg/dL 120(H) 113(H) 109(H)  BUN 6 - 20 mg/dL '9 7 9  '$ Creatinine 0.61 - 1.24 mg/dL 0.66 0.57(L) 0.70  Sodium 135 - 145 mmol/L 138 138 139  Potassium 3.5 - 5.1 mmol/L 3.8 3.4(L) 2.8(L)  Chloride 101 - 111 mmol/L 106 106 107  CO2 22 - 32 mmol/L '29 28 26  '$ Calcium 8.9 - 10.3 mg/dL 7.9(L) 7.6(L) 7.7(L)  Total Protein 6.5 - 8.1 g/dL - - -  Total Bilirubin 0.3 - 1.2 mg/dL - - -  Alkaline Phos 38 - 126 U/L - - -  AST 15 - 41 U/L - - -  ALT 17 - 63 U/L - - -     Urine Studies No results for input(s): UHGB, CRYS in the last 72 hours.  Invalid input(s): UACOL, UAPR, USPG, UPH, UTP, UGL, [place], UBIL, UNIT, UROB, [place], UEPI, UWBC,  Junie Panning [place], [place], [place]  Basic Metabolic Panel:  Recent Labs Lab 06/25/16 2123 06/27/16 0312 06/28/16 0900 06/29/16 0350 06/30/16 0400  NA 139 139 139 138 138  K 4.1 3.5 2.8* 3.4* 3.8  CL 97* 103 107 106 106  CO2 33* '31 26 28 29  '$ GLUCOSE 105* 112* 109* 113* 120*  BUN 28* '14 9 7 9  '$ CREATININE 1.02 0.77 0.70 0.57* 0.66  CALCIUM 9.0 8.2* 7.7* 7.6* 7.9*  MG  --  1.7  --  1.9 1.6*   GFR Estimated Creatinine Clearance: 80.9 mL/min (by C-G formula based on SCr of 0.66 mg/dL). Liver Function Tests: No results for input(s): AST, ALT, ALKPHOS, BILITOT, PROT, ALBUMIN in the last 168 hours. No results for input(s): LIPASE, AMYLASE in the last 168 hours. No results for input(s): AMMONIA in the last 168 hours. Coagulation profile No results for input(s): INR, PROTIME in the last 168 hours.  CBC:  Recent Labs Lab 06/25/16 2123 06/27/16 2641  06/28/16 0900 06/29/16 0350 06/30/16 0400  WBC 3.6* 1.2* 0.7* 0.5* 0.4*  NEUTROABS 2.6 0.9* 0.3* 0.3* 0.1*  HGB 11.4* 10.1* 9.1* 8.3* 8.1*  HCT 35.8* 32.0* 28.2* 25.0* 24.6*  MCV 91.8 92.0 90.7 86.8 89.5  PLT 89* 70* 49* 31* 27*   Cardiac Enzymes: No results for input(s): CKTOTAL, CKMB, CKMBINDEX, TROPONINI in the last 168 hours. BNP: Invalid input(s): POCBNP CBG:  Recent Labs Lab 06/27/16 2211 06/28/16 0735 06/28/16 1130 06/28/16 1654  GLUCAP 123* 85 114* 118*   D-Dimer No results for input(s): DDIMER in the last 72 hours. Hgb A1c No results for input(s): HGBA1C in the last 72 hours. Lipid Profile No results for input(s): CHOL, HDL, LDLCALC, TRIG, CHOLHDL, LDLDIRECT in the last 72 hours. Thyroid function studies No results for input(s): TSH, T4TOTAL, T3FREE, THYROIDAB in the last 72 hours.  Invalid input(s): FREET3 Anemia work up No results for input(s): VITAMINB12, FOLATE, FERRITIN, TIBC, IRON, RETICCTPCT in the last 72 hours. Microbiology Recent Results (from the past 240 hour(s))  Culture, blood (routine x 2)  Call MD if unable to obtain prior to antibiotics being given     Status: None (Preliminary result)   Collection Time: 06/26/16  2:00 AM  Result Value Ref Range Status   Specimen Description BLOOD LEFT ANTECUBITAL  Final   Special Requests BOTTLES DRAWN AEROBIC AND ANAEROBIC 5CC  Final   Culture   Final    NO GROWTH 4 DAYS Performed at [facility]    Report Status PENDING  Incomplete  Culture, blood (routine x 2) Call MD if unable to obtain prior to antibiotics being given     Status: None (Preliminary result)   Collection Time: 06/26/16  2:02 AM  Result Value Ref Range Status   Specimen Description BLOOD RIGHT FOREARM  Final   Special Requests BOTTLES DRAWN AEROBIC AND ANAEROBIC 5CC  Final   Culture   Final    NO GROWTH 4 DAYS Performed at [facility]    Report Status PENDING  Incomplete  MRSA PCR Screening     Status: None   Collection Time: 06/26/16  3:44 AM  Result Value Ref Range Status   MRSA by PCR NEGATIVE NEGATIVE Final    Comment:        The GeneXpert MRSA Assay (FDA approved for NASAL specimens only), is one component of a comprehensive MRSA colonization surveillance program. It is not intended to diagnose MRSA infection nor to guide or monitor treatment for MRSA infections.   Culture, sputum-assessment     Status: None   Collection Time: 06/26/16  2:34 PM  Result Value Ref Range Status   Specimen Description SPUTUM  Final   Special Requests NONE  Final   Sputum evaluation   Final    THIS SPECIMEN IS ACCEPTABLE. RESPIRATORY CULTURE REPORT TO FOLLOW.   Report Status 06/26/2016 FINAL  Final  Culture, respiratory (NON-Expectorated)     Status: None   Collection Time: 06/26/16  2:34 PM  Result Value Ref Range Status   Specimen Description SPUTUM  Final   Special Requests NONE  Final   Gram Stain   Final    MODERATE WBC PRESENT, PREDOMINANTLY PMN FEW GRAM POSITIVE COCCI IN PAIRS FEW GRAM NEGATIVE RODS FEW GRAM POSITIVE RODS    Culture   Final     Consistent with normal respiratory flora. Performed at [facility]    Report Status 06/28/2016 FINAL  Final      Studies:  No results  found.  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  1. Acute respiratory failure with hypoxia secondary to aspiration pneumonia versus HCAP, improving 2. Pancytopenia secondary to chemotherapy, severe neutropenia, worsening  3. Metastatic lung adenocarcinoma, status post 2 cycle chemotherapy, last on 06/20/2069 4. Newly discovered HIV infection 5. History of COPD 6. PBC and early liver cirrhosis 7. Chronic pain, secondary to his metastatic lung cancer  Recommendations: -his neutropenia continues getting worse, I increased Granix from 315mg to 4849m, today's dose was given by his nurse already, will start tomorrow, and d/c if ANC>1.5 or upon discharge  -Consider platelet transfusion if clinical signs of bleeding or platelets less than 15,000 -other management per primary and ID team  -please hold on discharge until ANC>1.0 and thrombocytopenia improves -will follow up as needed    FeTruitt MerleMD 06/30/2016  11:45 PM

## 2016-06-30 NOTE — Progress Notes (Signed)
Assumed care of patient from previous RN.  Agree with previous RN's assessment of patient.  Will continue to monitor. 

## 2016-06-30 NOTE — Progress Notes (Signed)
[facility] [employer] [facility]   Reason for visit: Follow up on HIV, aspiration pneumonia  Interval History: feeling better in regards to breathing, still adjusting to HIV diagnosis, no previous risk factors by his recollection.  Viral load pending.    Physical Exam: Constitutional:  Vitals:   06/29/16 2144 06/30/16 0443  BP: (!) 154/94 130/81  Pulse: 98 88  Resp: 19 18  Temp: 98.6 F (37 C) 98.5 F (36.9 C)   patient appears in NAD Eyes: anicteric HENT: no thrush Respiratory: Normal respiratory effort; CTA B Cardiovascular: RRR GI: soft, nt, nd  Review of Systems: Constitutional: negative for fevers and chills Respiratory: negative for cough or wheezing  Lab Results  Component Value Date   WBC 0.4 (LL) 06/30/2016   HGB 8.1 (L) 06/30/2016   HCT 24.6 (L) 06/30/2016   MCV 89.5 06/30/2016   PLT 27 (LL) 06/30/2016    Lab Results  Component Value Date   CREATININE 0.66 06/30/2016   BUN 9 06/30/2016   NA 138 06/30/2016   K 3.8 06/30/2016   CL 106 06/30/2016   CO2 29 06/30/2016    Lab Results  Component Value Date   ALT 12 (L) 06/07/2016   AST 22 06/07/2016   ALKPHOS 108 06/07/2016     Microbiology: Recent Results (from the past 240 hour(s))  Culture, blood (routine x 2) Call MD if unable to obtain prior to antibiotics being given     Status: None (Preliminary result)   Collection Time: 06/26/16  2:00 AM  Result Value Ref Range Status   Specimen Description BLOOD LEFT ANTECUBITAL  Final   Special Requests BOTTLES DRAWN AEROBIC AND ANAEROBIC 5CC  Final   Culture   Final    NO GROWTH 3 DAYS Performed at [facility]    Report Status PENDING  Incomplete  Culture, blood (routine x 2) Call MD if unable to obtain prior to antibiotics being given     Status: None (Preliminary result)   Collection Time: 06/26/16  2:02 AM  Result Value Ref Range Status   Specimen Description BLOOD RIGHT FOREARM  Final   Special Requests BOTTLES DRAWN AEROBIC AND  ANAEROBIC 5CC  Final   Culture   Final    NO GROWTH 3 DAYS Performed at [facility]    Report Status PENDING  Incomplete  MRSA PCR Screening     Status: None   Collection Time: 06/26/16  3:44 AM  Result Value Ref Range Status   MRSA by PCR NEGATIVE NEGATIVE Final    Comment:        The GeneXpert MRSA Assay (FDA approved for NASAL specimens only), is one component of a comprehensive MRSA colonization surveillance program. It is not intended to diagnose MRSA infection nor to guide or monitor treatment for MRSA infections.   Culture, sputum-assessment     Status: None   Collection Time: 06/26/16  2:34 PM  Result Value Ref Range Status   Specimen Description SPUTUM  Final   Special Requests NONE  Final   Sputum evaluation   Final    THIS SPECIMEN IS ACCEPTABLE. RESPIRATORY CULTURE REPORT TO FOLLOW.   Report Status 06/26/2016 FINAL  Final  Culture, respiratory (NON-Expectorated)     Status: None   Collection Time: 06/26/16  2:34 PM  Result Value Ref Range Status   Specimen Description SPUTUM  Final   Special Requests NONE  Final   Gram Stain   Final    MODERATE WBC PRESENT,  PREDOMINANTLY PMN FEW GRAM POSITIVE COCCI IN PAIRS FEW GRAM NEGATIVE RODS FEW GRAM POSITIVE RODS    Culture   Final    Consistent with normal respiratory flora. Performed at [facility]    Report Status 06/28/2016 FINAL  Final    Impression/Plan:  1. HIV - still adjusting to diagnosis.  On Tivicay and Descovy and bactrim for OI prophylaxis.  Viral load pending to confirm again with 4th generation test confirmed already.  CD4 sent though with pancytopenia will be low due to chemotherapy.   He will follow up with [place] in [place] and I will arrange this follow up  2. Aspiration pneumonia - improved.  Could transition to oral augment for 1-2 more days.    3.  OI prophylaxis - on bactrim.    4. Adenocarcinoma of lungs - getting chemo at [facility].

## 2016-07-01 LAB — CULTURE, BLOOD (ROUTINE X 2)
Culture: NO GROWTH
Culture: NO GROWTH

## 2016-07-01 LAB — CBC WITH DIFFERENTIAL/PLATELET
BASOS ABS: 0 10*3/uL (ref 0.0–0.1)
Basophils Relative: 0 %
EOS ABS: 0 10*3/uL (ref 0.0–0.7)
Eosinophils Relative: 2 %
HCT: 23.7 % — ABNORMAL LOW (ref 39.0–52.0)
Hemoglobin: 8.1 g/dL — ABNORMAL LOW (ref 13.0–17.0)
LYMPHS ABS: 0.5 10*3/uL — AB (ref 0.7–4.0)
Lymphocytes Relative: 72 %
MCH: 29.3 pg (ref 26.0–34.0)
MCHC: 34.2 g/dL (ref 30.0–36.0)
MCV: 85.9 fL (ref 78.0–100.0)
MONO ABS: 0 10*3/uL — AB (ref 0.1–1.0)
Monocytes Relative: 3 %
Neutro Abs: 0.1 10*3/uL — ABNORMAL LOW (ref 1.7–7.7)
Neutrophils Relative %: 23 %
PLATELETS: 15 10*3/uL — AB (ref 150–400)
RBC: 2.76 MIL/uL — AB (ref 4.22–5.81)
RDW: 15.9 % — AB (ref 11.5–15.5)
WBC: 0.6 10*3/uL — AB (ref 4.0–10.5)

## 2016-07-01 LAB — BASIC METABOLIC PANEL
ANION GAP: 4 — AB (ref 5–15)
BUN: 13 mg/dL (ref 6–20)
CALCIUM: 8.3 mg/dL — AB (ref 8.9–10.3)
CO2: 29 mmol/L (ref 22–32)
Chloride: 105 mmol/L (ref 101–111)
Creatinine, Ser: 0.76 mg/dL (ref 0.61–1.24)
GFR calc Af Amer: >60 mL/min (ref >60)
GLUCOSE: 92 mg/dL (ref 65–99)
Potassium: 4.8 mmol/L (ref 3.5–5.1)
SODIUM: 138 mmol/L (ref 135–145)

## 2016-07-01 LAB — MAGNESIUM: Magnesium: 1.9 mg/dL (ref 1.7–2.4)

## 2016-07-01 NOTE — Progress Notes (Signed)
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 Patient developed neutropenia and has HIV 1 antibody positive. Oncology and infectious disease consult called today.  Assessment & Plan:   #Acute respiratory failure with hypoxia secondary due to aspiration pneumonia versus HCAP: -clinically stable and asymptomatic today. No shortness of breath or cough. -Continue oral Augmentin. -Follow-up culture results. No growth. -Continue bronchodilators, respiratory therapy and wean oxygen as tolerated.  #Pancytopenia/Neutropenia due to chemotherapy and HIV -cell counts mildly improved. The dose of Granix increased by Hematologist. WBC count of 0.6 today -Continue to monitor closely and neutropenic precautions.  -has thrombocytopenia with no sign of bleeding. Continue to monitor.  #Metastatic stage IV adenocarcinoma of lung: -Undergoing chemotherapy at [facility]. Follow-up oncology for further evaluation and outpatient follow-up at [facility].  # Newly diagnosed HIV/ HIV 1 antibody positive:pt is unaware of this diagnosis before. Denied any IV drug use or unprotected sexual interaction. -started anti-viral, Bactrim by ID .  -follow up viral load and CD4 count. CD4 count will be low because of neutropenia. Needs outpatient follow-up with infectious disease team for further management.  # Hypokalemia: improved now. Monitor.  #Presumed esophagitis, likely candida infection: Continue nystatin and Diflucan for total 10 days.   #Severe protein calorie malnutrition likely in the setting of malignancy: Dietary referral and oral supplement.  #History of COPD and  tobacco use: Continue bronchodilators and respiratory therapy.  #History of GERD: Continue Pepcid  #Chronic pain management: Continue current dose of morphine. Pain seems adequately controlled. Continue to monitor.  DVT prophylaxis: SCD and early embolism. Avoid anticoagulation because of pancytopenia. Code Status: Full code Family Communication: No family present at bedside Disposition Plan: Likely discharge home with home care in 1-2 days.   Consultants:   ID and Hem/oncology  Procedures: no  Antimicrobials:  Unasyn since 06/26/2016  Subjective: Patient was seen and examined at bedside. No new event. Patient denies any symptoms. Denied fever, chills, headache, dizziness, chest pain, shortness of breath, abdominal pain.  Objective: Vitals:   06/30/16 1324 06/30/16 2100 06/30/16 2206 07/01/16 0512  BP: 119/75  134/86 (!) 149/88  Pulse: 87  89 93  Resp: '18  18 18  '$ Temp: 97.9 F (36.6 C)  98.1 F (36.7 C) 97.8 F (36.6 C)  TempSrc: Oral  Oral Oral  SpO2: 97% 93% 98% 98%  Weight:      Height:        Intake/Output Summary (Last 24 hours) at 07/01/16 1229 Last data filed at 07/01/16 0600  Gross per 24 hour  Intake             3990 ml  Output                0 ml  Net             3990 ml   Filed Weights   06/25/16 2016 06/26/16 0400  Weight: 60.8 kg (134 lb) 56.8 kg (125 lb 3.5 oz)    Examination:  General exam: Not in distress, lying comfortable Respiratory system: Clear to auscultation bilateral, no wheeze or crackle Cardiovascular system: Regular rate rhythm, S1-S2 normal. No lower extremity edema. Gastrointestinal system: Abdomen soft, nontender and nondistended.  Bowel sound positive Central nervous system: Alert, awake and oriented. No focal neurological deficit Extremities: Symmetric 5 x 5 power. Skin: No rashes, lesions or ulcers Psychiatry: Judgement and insight appear normal. Mood & affect appropriate.     Data Reviewed: I have personally reviewed  following labs and imaging studies  CBC:  Recent Labs Lab 06/27/16 0312 06/28/16 0900 06/29/16 0350 06/30/16 0400 07/01/16 0457  WBC 1.2* 0.7* 0.5* 0.4* 0.6*  NEUTROABS 0.9* 0.3* 0.3* 0.1* 0.1*  HGB 10.1* 9.1* 8.3* 8.1* 8.1*  HCT 32.0* 28.2* 25.0* 24.6* 23.7*  MCV 92.0 90.7 86.8 89.5 85.9  PLT 70* 49* 31* 27* 15*   Basic Metabolic Panel:  Recent Labs Lab 06/27/16 0312 06/28/16 0900 06/29/16 0350 06/30/16 0400 07/01/16 0457  NA 139 139 138 138 138  K 3.5 2.8* 3.4* 3.8 4.8  CL 103 107 106 106 105  CO2 '31 26 28 29 29  '$ GLUCOSE 112* 109* 113* 120* 92  BUN '14 9 7 9 13  '$ CREATININE 0.77 0.70 0.57* 0.66 0.76  CALCIUM 8.2* 7.7* 7.6* 7.9* 8.3*  MG 1.7  --  1.9 1.6* 1.9   GFR: Estimated Creatinine Clearance: 80.9 mL/min (by C-G formula based on SCr of 0.76 mg/dL). Liver Function Tests: No results for input(s): AST, ALT, ALKPHOS, BILITOT, PROT, ALBUMIN in the last 168 hours. No results for input(s): LIPASE, AMYLASE in the last 168 hours. No results for input(s): AMMONIA in the last 168 hours. Coagulation Profile: No results for input(s): INR, PROTIME in the last 168 hours. Cardiac Enzymes: No results for input(s): CKTOTAL, CKMB, CKMBINDEX, TROPONINI in the last 168 hours. BNP (last 3 results) No results for input(s): PROBNP in the last 8760 hours. HbA1C: No results for input(s): HGBA1C in the last 72 hours. CBG:  Recent Labs Lab 06/27/16 2211 06/28/16 0735 06/28/16 1130 06/28/16 1654  GLUCAP 123* 85 114* 118*   Lipid Profile: No results for input(s): CHOL, HDL, LDLCALC, TRIG, CHOLHDL, LDLDIRECT in the last 72 hours. Thyroid Function Tests: No results for input(s): TSH, T4TOTAL, FREET4, T3FREE, THYROIDAB in the last 72 hours. Anemia Panel: No results for input(s): VITAMINB12, FOLATE, FERRITIN, TIBC, IRON, RETICCTPCT in the last 72 hours. Sepsis Labs:  Recent Labs Lab 06/26/16 0033  LATICACIDVEN 0.60    Recent Results (from the past 240 hour(s))  Culture,  blood (routine x 2) Call MD if unable to obtain prior to antibiotics being given     Status: None   Collection Time: 06/26/16  2:00 AM  Result Value Ref Range Status   Specimen Description BLOOD LEFT ANTECUBITAL  Final   Special Requests BOTTLES DRAWN AEROBIC AND ANAEROBIC 5CC  Final   Culture   Final    NO GROWTH 5 DAYS Performed at [facility]    Report Status 07/01/2016 FINAL  Final  Culture, blood (routine x 2) Call MD if unable to obtain prior to antibiotics being given     Status: None   Collection Time: 06/26/16  2:02 AM  Result Value Ref Range Status   Specimen Description BLOOD RIGHT FOREARM  Final   Special Requests BOTTLES DRAWN AEROBIC AND ANAEROBIC 5CC  Final   Culture   Final    NO GROWTH 5 DAYS Performed at [facility]    Report Status 07/01/2016 FINAL  Final  MRSA PCR Screening     Status: None   Collection Time: 06/26/16  3:44 AM  Result Value Ref Range Status   MRSA by PCR NEGATIVE NEGATIVE Final  Comment:        The GeneXpert MRSA Assay (FDA approved for NASAL specimens only), is one component of a comprehensive MRSA colonization surveillance program. It is not intended to diagnose MRSA infection nor to guide or monitor treatment for MRSA infections.   Culture, sputum-assessment     Status: None   Collection Time: 06/26/16  2:34 PM  Result Value Ref Range Status   Specimen Description SPUTUM  Final   Special Requests NONE  Final   Sputum evaluation   Final    THIS SPECIMEN IS ACCEPTABLE. RESPIRATORY CULTURE REPORT TO FOLLOW.   Report Status 06/26/2016 FINAL  Final  Culture, respiratory (NON-Expectorated)     Status: None   Collection Time: 06/26/16  2:34 PM  Result Value Ref Range Status   Specimen Description SPUTUM  Final   Special Requests NONE  Final   Gram Stain   Final    MODERATE WBC PRESENT, PREDOMINANTLY PMN FEW GRAM POSITIVE COCCI IN PAIRS FEW GRAM NEGATIVE RODS FEW GRAM POSITIVE RODS    Culture   Final     Consistent with normal respiratory flora. Performed at [facility]    Report Status 06/28/2016 FINAL  Final         Radiology Studies: No results found.      Scheduled Meds: . amoxicillin-clavulanate  1 tablet Oral Q12H  . dolutegravir  50 mg Oral Daily  . DULoxetine  30 mg Oral Daily  . emtricitabine-tenofovir AF  1 tablet Oral Daily  . famotidine  40 mg Oral QHS  . fluconazole  100 mg Oral Daily  . folic acid  1 mg Oral Daily  . lactose free nutrition  1 Container Oral BID BM  . mouth rinse  15 mL Mouth Rinse BID  . mometasone-formoterol  2 puff Inhalation BID  . morphine  15 mg Oral Q12H  . multivitamin with minerals  1 tablet Oral Daily  . nystatin  5 mL Oral QID  . Obeticholic Acid  5 mg Oral Daily  . pantoprazole  40 mg Oral Q1200  . polyethylene glycol  34 g Oral BID  . sucralfate  1 g Oral TID WC & HS  . sulfamethoxazole-trimethoprim  1 tablet Oral Once per day on Mon Wed Fri  . Tbo-filgastrim (GRANIX) SQ  480 mcg Subcutaneous q1800  . tiotropium  18 mcg Inhalation Daily   Continuous Infusions: . 0.9 % NaCl with KCl 40 mEq / L 75 mL/hr (07/01/16 0219)     LOS: 5 days    Time spent: 21 minutes    Nijah Tejera Tanna Furry, MD [facility] Pager [phone]  If 7PM-7AM, please contact night-coverage [email or web address] Password [email or web address] 07/01/2016, 12:29 PM

## 2016-07-02 DIAGNOSIS — L8990 Pressure ulcer of unspecified site, unspecified stage: Secondary | ICD-10-CM | POA: Insufficient documentation

## 2016-07-02 LAB — CBC WITH DIFFERENTIAL/PLATELET
BASOS PCT: 0 %
Basophils Absolute: 0 10*3/uL (ref 0.0–0.1)
EOS PCT: 2 %
Eosinophils Absolute: 0 10*3/uL (ref 0.0–0.7)
HCT: 23.8 % — ABNORMAL LOW (ref 39.0–52.0)
Hemoglobin: 7.9 g/dL — ABNORMAL LOW (ref 13.0–17.0)
LYMPHS ABS: 0.5 10*3/uL — AB (ref 0.7–4.0)
Lymphocytes Relative: 73 %
MCH: 28.4 pg (ref 26.0–34.0)
MCHC: 33.2 g/dL (ref 30.0–36.0)
MCV: 85.6 fL (ref 78.0–100.0)
MONO ABS: 0 10*3/uL — AB (ref 0.1–1.0)
Monocytes Relative: 7 %
NEUTROS ABS: 0.1 10*3/uL — AB (ref 1.7–7.7)
Neutrophils Relative %: 18 %
PLATELETS: 9 10*3/uL — AB (ref 150–400)
RBC: 2.78 MIL/uL — ABNORMAL LOW (ref 4.22–5.81)
RDW: 15.7 % — AB (ref 11.5–15.5)
WBC: 0.6 10*3/uL — AB (ref 4.0–10.5)

## 2016-07-02 LAB — ABO/RH: ABO/RH(D): O POS

## 2016-07-02 MED ORDER — SODIUM CHLORIDE 0.9 % IV SOLN
Freq: Once | INTRAVENOUS | Status: AC
  Administered 2016-07-02: 13:00:00 via INTRAVENOUS

## 2016-07-02 NOTE — Progress Notes (Addendum)
Platelets infused starting at 1256 at a rate of 174m/h,  blood bank number [id number] expiration 07/03/2016, issue time 1243 . Pt monitored for reaction for first 15 minutes, no reaction, VSS and documented. Rate increased to 200 mL/h.   Platelet transfusion complete at 1420. Post transfusion VSS.  CKizzie Ide RN

## 2016-07-02 NOTE — Progress Notes (Signed)
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 Patient developed neutropenia and has HIV 1 antibody positive. Oncology and infectious disease consult called today.  Assessment & Plan:   #Acute respiratory failure with hypoxia secondary due to aspiration pneumonia versus HCAP: -Clinically improving and is symptomatically today. Oxygen saturation acceptable in room air. -Continue oral Augmentin. -Culture results showed no growth. -Continue bronchodilators, respiratory therapy.  #Pancytopenia/Neutropenia due to chemotherapy and HIV -No significant improvement in WBC count. WBC of 0.6 today. The dose of Granix increased by Hematologist.  -Continue to monitor CBC closely and neutropenic precaution..  -Thrombocytopenia worsen to platelet counts of 9000 only. No sign of active bleeding noticed. Plan to transfuse 2 units of platelets today to prevent any spontaneous bleeding. Hematology consult following. I discussed this with the patient and he agreed with the plan.  #Metastatic stage IV adenocarcinoma of lung: -Undergoing chemotherapy at [facility]. Follow-up oncology for further evaluation and outpatient follow-up at [facility].  # Newly diagnosed HIV/ HIV 1 antibody positive:pt is unaware of this diagnosis before. Denied any IV drug use or unprotected sexual interaction. -started anti-viral, Bactrim by ID .  -follow up viral load and CD4 count. CD4 count is low, probably also contributed by severe neutropenia. Needs outpatient follow-up with infectious disease team for further management.  # Hypokalemia: improved now. Monitor.  #Presumed esophagitis,  likely candida infection: Continue nystatin and Diflucan for total 10 days.   #Severe protein calorie malnutrition likely in the setting of malignancy: Dietary referral and oral supplement.  #History of COPD and tobacco use: Continue bronchodilators and respiratory therapy.  #History of GERD: Continue Pepcid  #Chronic pain management: Continue current dose of morphine. Pain seems adequately controlled. Continue to monitor.  DVT prophylaxis: SCD and early embolism. Avoid anticoagulation because of pancytopenia. Code Status: Full code Family Communication: No family present at bedside Disposition Plan: Likely discharge home with home care in 1-2 days.   Consultants:   ID and Hem/oncology  Procedures: no  Antimicrobials:  Unasyn since 06/26/2016  Subjective: Patient was seen and examined at bedside. No new event. Patient continues to denies any symptoms. He denied fever, chills, headache, dizziness, chest pain, shortness of breath, nausea, vomiting, abdominal pain, diarrhea, dysuria, urgency or leg pain.  Objective: Vitals:   07/01/16 0512 07/01/16 1456 07/01/16 2103 07/02/16 0535  BP: (!) 149/88 137/81 (!) 148/97 118/73  Pulse: 93 90 77 91  Resp: '18 18 18 18  '$ Temp: 97.8 F (36.6 C) 97.9 F (36.6 C) 98.6 F (37 C) 98.4 F (36.9 C)  TempSrc: Oral Oral Oral Oral  SpO2: 98% 97% 100% 90%  Weight:      Height:        Intake/Output Summary (Last 24 hours) at 07/02/16 1215 Last data filed at 07/01/16 1700  Gross per 24 hour  Intake          1068.75 ml  Output                0 ml  Net          1068.75 ml   Filed Weights   06/25/16 2016 06/26/16 0400  Weight: 60.8 kg (134 lb) 56.8 kg (125 lb  3.5 oz)    Examination:  General exam: Pleasant male not in distress, lying on bed comfortably. Respiratory system: Clear to auscultation bilateral, no wheezing or crackles appreciated. Cardiovascular system: Regular rate rhythm, S1-S2 normal. No lower extremity edema  noticed. Gastrointestinal system: Abdomen soft, nontender, nondistended. Bowel sound positive. Central nervous system: Alert, awake and oriented. No focal neurological deficit Extremities: Symmetric 5 x 5 power. Skin: No rashes, lesions or ulcers Psychiatry: Judgement and insight appear normal. Mood & affect appropriate.     Data Reviewed: I have personally reviewed following labs and imaging studies  CBC:  Recent Labs Lab 06/28/16 0900 06/29/16 0350 06/30/16 0400 07/01/16 0457 07/02/16 0500  WBC 0.7* 0.5* 0.4* 0.6* 0.6*  NEUTROABS 0.3* 0.3* 0.1* 0.1* 0.1*  HGB 9.1* 8.3* 8.1* 8.1* 7.9*  HCT 28.2* 25.0* 24.6* 23.7* 23.8*  MCV 90.7 86.8 89.5 85.9 85.6  PLT 49* 31* 27* 15* 9*   Basic Metabolic Panel:  Recent Labs Lab 06/27/16 0312 06/28/16 0900 06/29/16 0350 06/30/16 0400 07/01/16 0457  NA 139 139 138 138 138  K 3.5 2.8* 3.4* 3.8 4.8  CL 103 107 106 106 105  CO2 '31 26 28 29 29  '$ GLUCOSE 112* 109* 113* 120* 92  BUN '14 9 7 9 13  '$ CREATININE 0.77 0.70 0.57* 0.66 0.76  CALCIUM 8.2* 7.7* 7.6* 7.9* 8.3*  MG 1.7  --  1.9 1.6* 1.9   GFR: Estimated Creatinine Clearance: 80.9 mL/min (by C-G formula based on SCr of 0.76 mg/dL). Liver Function Tests: No results for input(s): AST, ALT, ALKPHOS, BILITOT, PROT, ALBUMIN in the last 168 hours. No results for input(s): LIPASE, AMYLASE in the last 168 hours. No results for input(s): AMMONIA in the last 168 hours. Coagulation Profile: No results for input(s): INR, PROTIME in the last 168 hours. Cardiac Enzymes: No results for input(s): CKTOTAL, CKMB, CKMBINDEX, TROPONINI in the last 168 hours. BNP (last 3 results) No results for input(s): PROBNP in the last 8760 hours. HbA1C: No results for input(s): HGBA1C in the last 72 hours. CBG:  Recent Labs Lab 06/27/16 2211 06/28/16 0735 06/28/16 1130 06/28/16 1654  GLUCAP 123* 85 114* 118*   Lipid Profile: No results for input(s): CHOL, HDL, LDLCALC, TRIG, CHOLHDL, LDLDIRECT in  the last 72 hours. Thyroid Function Tests: No results for input(s): TSH, T4TOTAL, FREET4, T3FREE, THYROIDAB in the last 72 hours. Anemia Panel: No results for input(s): VITAMINB12, FOLATE, FERRITIN, TIBC, IRON, RETICCTPCT in the last 72 hours. Sepsis Labs:  Recent Labs Lab 06/26/16 0033  LATICACIDVEN 0.60    Recent Results (from the past 240 hour(s))  Culture, blood (routine x 2) Call MD if unable to obtain prior to antibiotics being given     Status: None   Collection Time: 06/26/16  2:00 AM  Result Value Ref Range Status   Specimen Description BLOOD LEFT ANTECUBITAL  Final   Special Requests BOTTLES DRAWN AEROBIC AND ANAEROBIC 5CC  Final   Culture   Final    NO GROWTH 5 DAYS Performed at [facility]    Report Status 07/01/2016 FINAL  Final  Culture, blood (routine x 2) Call MD if unable to obtain prior to antibiotics being given     Status: None   Collection Time: 06/26/16  2:02 AM  Result Value Ref Range Status   Specimen Description BLOOD RIGHT FOREARM  Final   Special Requests BOTTLES DRAWN AEROBIC AND ANAEROBIC 5CC  Final   Culture   Final    NO GROWTH 5 DAYS  Performed at [facility]    Report Status 07/01/2016 FINAL  Final  MRSA PCR Screening     Status: None   Collection Time: 06/26/16  3:44 AM  Result Value Ref Range Status   MRSA by PCR NEGATIVE NEGATIVE Final    Comment:        The GeneXpert MRSA Assay (FDA approved for NASAL specimens only), is one component of a comprehensive MRSA colonization surveillance program. It is not intended to diagnose MRSA infection nor to guide or monitor treatment for MRSA infections.   Culture, sputum-assessment     Status: None   Collection Time: 06/26/16  2:34 PM  Result Value Ref Range Status   Specimen Description SPUTUM  Final   Special Requests NONE  Final   Sputum evaluation   Final    THIS SPECIMEN IS ACCEPTABLE. RESPIRATORY CULTURE REPORT TO FOLLOW.   Report Status 06/26/2016 FINAL  Final   Culture, respiratory (NON-Expectorated)     Status: None   Collection Time: 06/26/16  2:34 PM  Result Value Ref Range Status   Specimen Description SPUTUM  Final   Special Requests NONE  Final   Gram Stain   Final    MODERATE WBC PRESENT, PREDOMINANTLY PMN FEW GRAM POSITIVE COCCI IN PAIRS FEW GRAM NEGATIVE RODS FEW GRAM POSITIVE RODS    Culture   Final    Consistent with normal respiratory flora. Performed at [facility]    Report Status 06/28/2016 FINAL  Final         Radiology Studies: No results found.      Scheduled Meds: . sodium chloride   Intravenous Once  . amoxicillin-clavulanate  1 tablet Oral Q12H  . dolutegravir  50 mg Oral Daily  . DULoxetine  30 mg Oral Daily  . emtricitabine-tenofovir AF  1 tablet Oral Daily  . famotidine  40 mg Oral QHS  . fluconazole  100 mg Oral Daily  . folic acid  1 mg Oral Daily  . lactose free nutrition  1 Container Oral BID BM  . mouth rinse  15 mL Mouth Rinse BID  . mometasone-formoterol  2 puff Inhalation BID  . morphine  15 mg Oral Q12H  . multivitamin with minerals  1 tablet Oral Daily  . nystatin  5 mL Oral QID  . Obeticholic Acid  5 mg Oral Daily  . pantoprazole  40 mg Oral Q1200  . polyethylene glycol  34 g Oral BID  . sucralfate  1 g Oral TID WC & HS  . sulfamethoxazole-trimethoprim  1 tablet Oral Once per day on Mon Wed Fri  . Tbo-filgastrim (GRANIX) SQ  480 mcg Subcutaneous q1800  . tiotropium  18 mcg Inhalation Daily   Continuous Infusions: . 0.9 % NaCl with KCl 40 mEq / L 75 mL/hr (07/02/16 0516)     LOS: 6 days    Time spent: 24 minutes    Addasyn Mcbreen Tanna Furry, MD [facility] Pager [phone]  If 7PM-7AM, please contact night-coverage [email or web address] Password [email or web address] 07/02/2016, 12:15 PM

## 2016-07-03 LAB — PREPARE PLATELET PHERESIS
UNIT DIVISION: 0
Unit division: 0

## 2016-07-03 LAB — CBC WITH DIFFERENTIAL/PLATELET
BASOS ABS: 0 10*3/uL (ref 0.0–0.1)
BASOS PCT: 0 %
EOS ABS: 0 10*3/uL (ref 0.0–0.7)
Eosinophils Relative: 1 %
HCT: 22.1 % — ABNORMAL LOW (ref 39.0–52.0)
HEMOGLOBIN: 7.4 g/dL — AB (ref 13.0–17.0)
LYMPHS PCT: 52 %
Lymphs Abs: 0.5 10*3/uL — ABNORMAL LOW (ref 0.7–4.0)
MCH: 28.2 pg (ref 26.0–34.0)
MCHC: 33.5 g/dL (ref 30.0–36.0)
MCV: 84.4 fL (ref 78.0–100.0)
Monocytes Absolute: 0 10*3/uL — ABNORMAL LOW (ref 0.1–1.0)
Monocytes Relative: 2 %
NEUTROS PCT: 45 %
Neutro Abs: 0.4 10*3/uL — ABNORMAL LOW (ref 1.7–7.7)
Platelets: 40 10*3/uL — ABNORMAL LOW (ref 150–400)
RBC: 2.62 MIL/uL — ABNORMAL LOW (ref 4.22–5.81)
RDW: 15.5 % (ref 11.5–15.5)
WBC: 0.9 10*3/uL — CL (ref 4.0–10.5)

## 2016-07-03 LAB — MAGNESIUM: MAGNESIUM: 1.6 mg/dL — AB (ref 1.7–2.4)

## 2016-07-03 LAB — BASIC METABOLIC PANEL
Anion gap: 6 (ref 5–15)
BUN: 13 mg/dL (ref 6–20)
CALCIUM: 8.1 mg/dL — AB (ref 8.9–10.3)
CHLORIDE: 104 mmol/L (ref 101–111)
CO2: 27 mmol/L (ref 22–32)
CREATININE: 0.77 mg/dL (ref 0.61–1.24)
GFR calc non Af Amer: >60 mL/min (ref >60)
Glucose, Bld: 87 mg/dL (ref 65–99)
Potassium: 4 mmol/L (ref 3.5–5.1)
SODIUM: 137 mmol/L (ref 135–145)

## 2016-07-03 NOTE — Progress Notes (Signed)
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 Patient developed neutropenia and has HIV 1 antibody positive. Oncology and infectious disease consult called today.  Assessment & Plan:   #Acute respiratory failure with hypoxia secondary due to aspiration pneumonia versus HCAP: -Clinically improving and is symptomatically today. Oxygen saturation acceptable in room air. -discontinue oral Augmentin, completed course -Culture results showed no growth. -Continue bronchodilators, respiratory therapy.  #Pancytopenia/Neutropenia due to chemotherapy and HIV -wbc 0.9 and plt 40 today. Continue Granix   -Continue to monitor CBC closely and neutropenic precaution..  -received 2 units of platelets on 10/15. No sign of bleeding.   #Metastatic stage IV adenocarcinoma of lung: -Undergoing chemotherapy at [facility]. Follow-up oncology for further evaluation and outpatient follow-up at [facility].  # Newly diagnosed HIV/ HIV 1 antibody positive:pt is unaware of this diagnosis before. Denied any IV drug use or unprotected sexual interaction. -started anti-viral, Bactrim by ID .  -follow up viral load and CD4 count. CD4 count is low, probably also contributed by severe neutropenia. Needs outpatient follow-up with infectious disease team for further management.  # Hypokalemia: improved now. Monitor.  #Presumed esophagitis, likely candida infection: Continue nystatin and Diflucan for total 10 days.   #Severe protein calorie malnutrition likely in the setting of malignancy: Dietary referral and oral supplement.  #History of COPD and tobacco use: Continue  bronchodilators and respiratory therapy.  #History of GERD: Continue Pepcid  #Chronic pain management: Continue current dose of morphine. Pain seems adequately controlled. Continue to monitor.  DVT prophylaxis: SCD and early embolism. Avoid anticoagulation because of pancytopenia. Code Status: Full code Family Communication: No family present at bedside Disposition Plan: Likely discharge home with home care in 1-2 days.   Consultants:   ID and Hem/oncology  Procedures: no  Antimicrobials:  Unasyn since 06/26/2016  Subjective: Patient was seen and examined at bedside. Patient reported feeling better. Denies fever, chills, nausea, vomiting, chest pain, shortness of breath, abdominal pain. Has good oral intake.  Objective: Vitals:   07/02/16 1504 07/02/16 1720 07/02/16 2036 07/03/16 0500  BP: 125/89 (!) 149/89 (!) 136/96 (!) 142/83  Pulse: 88 86 93 94  Resp: '19 20 18 16  '$ Temp: 98.4 F (36.9 C) 97.7 F (36.5 C) 98.6 F (37 C) 98.6 F (37 C)  TempSrc: Oral Oral Oral Oral  SpO2: 95% 97% 100% 95%  Weight:      Height:        Intake/Output Summary (Last 24 hours) at 07/03/16 1124 Last data filed at 07/03/16 0631  Gross per 24 hour  Intake           3759.5 ml  Output                0 ml  Net           3759.5 ml   Filed Weights   06/25/16 2016 06/26/16 0400  Weight: 60.8 kg (134 lb) 56.8 kg (125 lb 3.5 oz)    Examination:  General exam: Pleasant male lying comfortable. Denies any needs. Respiratory system: Clear to auscultation bilateral, no wheezing or crackle. Cardiovascular system: Regular rate rhythm, S1-S2 normal. No lower extremity edema. Gastrointestinal system: Abdomen soft, nontender, nondistended.  Bowel sound positive. Central nervous system: Alert, awake and following commands Extremities: Symmetric 5 x 5 power. Skin: No rashes, lesions or ulcers Psychiatry: Judgement and insight appear normal. Mood & affect appropriate.     Data Reviewed: I have  personally reviewed following labs and imaging studies  CBC:  Recent Labs Lab 06/29/16 0350 06/30/16 0400 07/01/16 0457 07/02/16 0500 07/03/16 0700  WBC 0.5* 0.4* 0.6* 0.6* 0.9*  NEUTROABS 0.3* 0.1* 0.1* 0.1* 0.4*  HGB 8.3* 8.1* 8.1* 7.9* 7.4*  HCT 25.0* 24.6* 23.7* 23.8* 22.1*  MCV 86.8 89.5 85.9 85.6 84.4  PLT 31* 27* 15* 9* 40*   Basic Metabolic Panel:  Recent Labs Lab 06/27/16 0312 06/28/16 0900 06/29/16 0350 06/30/16 0400 07/01/16 0457 07/03/16 0700  NA 139 139 138 138 138 137  K 3.5 2.8* 3.4* 3.8 4.8 4.0  CL 103 107 106 106 105 104  CO2 '31 26 28 29 29 27  '$ GLUCOSE 112* 109* 113* 120* 92 87  BUN '14 9 7 9 13 13  '$ CREATININE 0.77 0.70 0.57* 0.66 0.76 0.77  CALCIUM 8.2* 7.7* 7.6* 7.9* 8.3* 8.1*  MG 1.7  --  1.9 1.6* 1.9 1.6*   GFR: Estimated Creatinine Clearance: 80.9 mL/min (by C-G formula based on SCr of 0.77 mg/dL). Liver Function Tests: No results for input(s): AST, ALT, ALKPHOS, BILITOT, PROT, ALBUMIN in the last 168 hours. No results for input(s): LIPASE, AMYLASE in the last 168 hours. No results for input(s): AMMONIA in the last 168 hours. Coagulation Profile: No results for input(s): INR, PROTIME in the last 168 hours. Cardiac Enzymes: No results for input(s): CKTOTAL, CKMB, CKMBINDEX, TROPONINI in the last 168 hours. BNP (last 3 results) No results for input(s): PROBNP in the last 8760 hours. HbA1C: No results for input(s): HGBA1C in the last 72 hours. CBG:  Recent Labs Lab 06/27/16 2211 06/28/16 0735 06/28/16 1130 06/28/16 1654  GLUCAP 123* 85 114* 118*   Lipid Profile: No results for input(s): CHOL, HDL, LDLCALC, TRIG, CHOLHDL, LDLDIRECT in the last 72 hours. Thyroid Function Tests: No results for input(s): TSH, T4TOTAL, FREET4, T3FREE, THYROIDAB in the last 72 hours. Anemia Panel: No results for input(s): VITAMINB12, FOLATE, FERRITIN, TIBC, IRON, RETICCTPCT in the last 72 hours. Sepsis Labs: No results for input(s): PROCALCITON,  LATICACIDVEN in the last 168 hours.  Recent Results (from the past 240 hour(s))  Culture, blood (routine x 2) Call MD if unable to obtain prior to antibiotics being given     Status: None   Collection Time: 06/26/16  2:00 AM  Result Value Ref Range Status   Specimen Description BLOOD LEFT ANTECUBITAL  Final   Special Requests BOTTLES DRAWN AEROBIC AND ANAEROBIC 5CC  Final   Culture   Final    NO GROWTH 5 DAYS Performed at [facility]    Report Status 07/01/2016 FINAL  Final  Culture, blood (routine x 2) Call MD if unable to obtain prior to antibiotics being given     Status: None   Collection Time: 06/26/16  2:02 AM  Result Value Ref Range Status   Specimen Description BLOOD RIGHT FOREARM  Final   Special Requests BOTTLES DRAWN AEROBIC AND ANAEROBIC 5CC  Final   Culture   Final    NO GROWTH 5 DAYS Performed at [facility]    Report Status 07/01/2016 FINAL  Final  MRSA PCR Screening     Status: None   Collection Time: 06/26/16  3:44 AM  Result Value Ref Range Status  MRSA by PCR NEGATIVE NEGATIVE Final    Comment:        The GeneXpert MRSA Assay (FDA approved for NASAL specimens only), is one component of a comprehensive MRSA colonization surveillance program. It is not intended to diagnose MRSA infection nor to guide or monitor treatment for MRSA infections.   Culture, sputum-assessment     Status: None   Collection Time: 06/26/16  2:34 PM  Result Value Ref Range Status   Specimen Description SPUTUM  Final   Special Requests NONE  Final   Sputum evaluation   Final    THIS SPECIMEN IS ACCEPTABLE. RESPIRATORY CULTURE REPORT TO FOLLOW.   Report Status 06/26/2016 FINAL  Final  Culture, respiratory (NON-Expectorated)     Status: None   Collection Time: 06/26/16  2:34 PM  Result Value Ref Range Status   Specimen Description SPUTUM  Final   Special Requests NONE  Final   Gram Stain   Final    MODERATE WBC PRESENT, PREDOMINANTLY PMN FEW GRAM POSITIVE  COCCI IN PAIRS FEW GRAM NEGATIVE RODS FEW GRAM POSITIVE RODS    Culture   Final    Consistent with normal respiratory flora. Performed at [facility]    Report Status 06/28/2016 FINAL  Final         Radiology Studies: No results found.      Scheduled Meds: . dolutegravir  50 mg Oral Daily  . DULoxetine  30 mg Oral Daily  . emtricitabine-tenofovir AF  1 tablet Oral Daily  . famotidine  40 mg Oral QHS  . fluconazole  100 mg Oral Daily  . folic acid  1 mg Oral Daily  . lactose free nutrition  1 Container Oral BID BM  . mouth rinse  15 mL Mouth Rinse BID  . mometasone-formoterol  2 puff Inhalation BID  . morphine  15 mg Oral Q12H  . multivitamin with minerals  1 tablet Oral Daily  . nystatin  5 mL Oral QID  . Obeticholic Acid  5 mg Oral Daily  . pantoprazole  40 mg Oral Q1200  . polyethylene glycol  34 g Oral BID  . sucralfate  1 g Oral TID WC & HS  . sulfamethoxazole-trimethoprim  1 tablet Oral Once per day on Mon Wed Fri  . Tbo-filgastrim (GRANIX) SQ  480 mcg Subcutaneous q1800  . tiotropium  18 mcg Inhalation Daily   Continuous Infusions: . 0.9 % NaCl with KCl 40 mEq / L 75 mL/hr at 07/03/16 0631     LOS: 7 days    Time spent: 46mnutes    Dorinne Graeff PTanna Furry MD [facility] Pager [phone] If 7PM-7AM, please contact night-coverage [email or web address] Password [email or web address] 07/03/2016, 11:24 AM

## 2016-07-04 DIAGNOSIS — B20 Human immunodeficiency virus [HIV] disease: Secondary | ICD-10-CM

## 2016-07-04 LAB — BASIC METABOLIC PANEL
Anion gap: 3 — ABNORMAL LOW (ref 5–15)
BUN: 13 mg/dL (ref 6–20)
CALCIUM: 8.3 mg/dL — AB (ref 8.9–10.3)
CO2: 29 mmol/L (ref 22–32)
CREATININE: 0.84 mg/dL (ref 0.61–1.24)
Chloride: 108 mmol/L (ref 101–111)
Glucose, Bld: 89 mg/dL (ref 65–99)
Potassium: 4.6 mmol/L (ref 3.5–5.1)
SODIUM: 140 mmol/L (ref 135–145)

## 2016-07-04 LAB — CBC WITH DIFFERENTIAL/PLATELET
BASOS ABS: 0 10*3/uL (ref 0.0–0.1)
BASOS PCT: 0 %
EOS ABS: 0 10*3/uL (ref 0.0–0.7)
EOS PCT: 1 %
HEMATOCRIT: 22.1 % — AB (ref 39.0–52.0)
Hemoglobin: 7.3 g/dL — ABNORMAL LOW (ref 13.0–17.0)
Lymphocytes Relative: 37 %
Lymphs Abs: 0.5 10*3/uL — ABNORMAL LOW (ref 0.7–4.0)
MCH: 29.1 pg (ref 26.0–34.0)
MCHC: 33 g/dL (ref 30.0–36.0)
MCV: 88 fL (ref 78.0–100.0)
MONO ABS: 0.2 10*3/uL (ref 0.1–1.0)
Monocytes Relative: 13 %
Neutro Abs: 0.7 10*3/uL — ABNORMAL LOW (ref 1.7–7.7)
Neutrophils Relative %: 49 %
PLATELETS: 33 10*3/uL — AB (ref 150–400)
RBC: 2.51 MIL/uL — ABNORMAL LOW (ref 4.22–5.81)
RDW: 15.9 % — AB (ref 11.5–15.5)
WBC: 1.4 10*3/uL — AB (ref 4.0–10.5)

## 2016-07-04 MED ORDER — MAGNESIUM SULFATE 2 GM/50ML IV SOLN
2.0000 g | Freq: Once | INTRAVENOUS | Status: AC
  Administered 2016-07-04: 2 g via INTRAVENOUS
  Filled 2016-07-04: qty 50

## 2016-07-04 NOTE — Progress Notes (Signed)
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 Patient developed neutropenia and has HIV 1 antibody positive. Oncology and infectious disease consult evaluated the patient. Clinically better.  Assessment & Plan:   #Acute respiratory failure with hypoxia secondary due to aspiration pneumonia versus HCAP: -Clinically improving. Oxygen saturation acceptable in room air. -discontinued oral Augmentin, completed course -Culture results showed no growth. -Continue bronchodilators, respiratory therapy.  #Pancytopenia/Neutropenia due to chemotherapy and HIV -wbc 1.4 and plt dropped to 33 today. Continue high of granix at least one more day (unit wbc >1.5). I expect counts to improve before discharge. -Continue to monitor CBC closely and neutropenic precaution..  -received 2 units of platelets on 10/15. No sign of bleeding.  -continue to provide supportive care -hematology consult appreciated.   #Metastatic stage IV adenocarcinoma of lung: -Undergoing chemotherapy at [facility]. Follow-up oncology for further evaluation and outpatient follow-up at [facility].  # Newly diagnosed HIV/ HIV 1 antibody positive:pt is unaware of this diagnosis before. Denied any IV drug use or unprotected sexual interaction. -started anti-viral, Bactrim by ID .  -CD4 count is low, probably also contributed by severe neutropenia. Needs outpatient follow-up with infectious disease team for further management.  # Hypokalemia: improved now. Monitor.  #Presumed esophagitis, likely candida infection: Continue nystatin and Diflucan for total 10 days total.   #Severe protein calorie  malnutrition likely in the setting of malignancy: Dietary referral and oral supplement.  #History of COPD and tobacco use: Continue bronchodilators and respiratory therapy.  #History of GERD: Continue Pepcid  #Chronic pain management: dc IV pain medications. Continue oral prn morphine. Pain seems adequately controlled. Continue to monitor.  # generalized weakness: PT,OT eval and treatment. Likely needs home care on discharge.   DVT prophylaxis: SCD and early ambulation. Avoid anticoagulation because of pancytopenia. Code Status: Full code Family Communication: No family present at bedside Disposition Plan: Likely discharge home with home care in 1-2 days.   Consultants:   ID and Hem/oncology  Procedures: no  Antimicrobials:  Off augment. On HIV Antiviral and prophylaxis medications.  Subjective: Patient was seen and examined at bedside. Patient reported feeling weak and tired. Denied fever, chills, chest pain, shortness of breath, abdominal pain. Reported nausea but denied vomiting. No diarrhea or constipation.  Objective: Vitals:   07/03/16 2110 07/04/16 0515 07/04/16 0855 07/04/16 1351  BP: (!) 160/64 (!) 145/87  114/66  Pulse: 62 94  91  Resp: '18 18  16  '$ Temp: 98.3 F (36.8 C) 98.3 F (36.8 C)  97.7 F (36.5 C)  TempSrc: Oral Oral  Oral  SpO2: 98% 96% 94% 99%  Weight:      Height:        Intake/Output Summary (Last 24 hours) at 07/04/16 1600 Last data filed at 07/04/16 1346  Gross per 24 hour  Intake           2262.5 ml  Output                0 ml  Net           2262.5 ml   Filed Weights   06/25/16 2016 06/26/16 0400  Weight: 60.8 kg (134 lb) 56.8 kg (125 lb  3.5 oz)    Examination:  General exam:Pleasant male lying comfortable, not in distress. Respiratory system: Clear to auscultation bilateral, no wheezing or crackles. Cardiovascular system: Regular rate and rhythm, S1-S2 normal. No lower extremity edema. Gastrointestinal system: Bowel sound positive.  Abdomen soft, nontender and nondistended. Central nervous system: Alert, awake, oriented. No focal deficit. Extremities: Symmetric 5 x 5 power. Skin: No rashes, lesions or ulcers Psychiatry: Judgement and insight appear normal. Mood & affect appropriate.     Data Reviewed: I have personally reviewed following labs and imaging studies  CBC:  Recent Labs Lab 06/30/16 0400 07/01/16 0457 07/02/16 0500 07/03/16 0700 07/04/16 0345  WBC 0.4* 0.6* 0.6* 0.9* 1.4*  NEUTROABS 0.1* 0.1* 0.1* 0.4* 0.7*  HGB 8.1* 8.1* 7.9* 7.4* 7.3*  HCT 24.6* 23.7* 23.8* 22.1* 22.1*  MCV 89.5 85.9 85.6 84.4 88.0  PLT 27* 15* 9* 40* 33*   Basic Metabolic Panel:  Recent Labs Lab 06/29/16 0350 06/30/16 0400 07/01/16 0457 07/03/16 0700 07/04/16 0345  NA 138 138 138 137 140  K 3.4* 3.8 4.8 4.0 4.6  CL 106 106 105 104 108  CO2 '28 29 29 27 29  '$ GLUCOSE 113* 120* 92 87 89  BUN '7 9 13 13 13  '$ CREATININE 0.57* 0.66 0.76 0.77 0.84  CALCIUM 7.6* 7.9* 8.3* 8.1* 8.3*  MG 1.9 1.6* 1.9 1.6*  --    GFR: Estimated Creatinine Clearance: 77 mL/min (by C-G formula based on SCr of 0.84 mg/dL). Liver Function Tests: No results for input(s): AST, ALT, ALKPHOS, BILITOT, PROT, ALBUMIN in the last 168 hours. No results for input(s): LIPASE, AMYLASE in the last 168 hours. No results for input(s): AMMONIA in the last 168 hours. Coagulation Profile: No results for input(s): INR, PROTIME in the last 168 hours. Cardiac Enzymes: No results for input(s): CKTOTAL, CKMB, CKMBINDEX, TROPONINI in the last 168 hours. BNP (last 3 results) No results for input(s): PROBNP in the last 8760 hours. HbA1C: No results for input(s): HGBA1C in the last 72 hours. CBG:  Recent Labs Lab 06/27/16 2211 06/28/16 0735 06/28/16 1130 06/28/16 1654  GLUCAP 123* 85 114* 118*   Lipid Profile: No results for input(s): CHOL, HDL, LDLCALC, TRIG, CHOLHDL, LDLDIRECT in the last 72 hours. Thyroid Function Tests: No results for input(s): TSH,  T4TOTAL, FREET4, T3FREE, THYROIDAB in the last 72 hours. Anemia Panel: No results for input(s): VITAMINB12, FOLATE, FERRITIN, TIBC, IRON, RETICCTPCT in the last 72 hours. Sepsis Labs: No results for input(s): PROCALCITON, LATICACIDVEN in the last 168 hours.  Recent Results (from the past 240 hour(s))  Culture, blood (routine x 2) Call MD if unable to obtain prior to antibiotics being given     Status: None   Collection Time: 06/26/16  2:00 AM  Result Value Ref Range Status   Specimen Description BLOOD LEFT ANTECUBITAL  Final   Special Requests BOTTLES DRAWN AEROBIC AND ANAEROBIC 5CC  Final   Culture   Final    NO GROWTH 5 DAYS Performed at [facility]    Report Status 07/01/2016 FINAL  Final  Culture, blood (routine x 2) Call MD if unable to obtain prior to antibiotics being given     Status: None   Collection Time: 06/26/16  2:02 AM  Result Value Ref Range Status   Specimen Description BLOOD RIGHT FOREARM  Final   Special Requests BOTTLES DRAWN AEROBIC AND ANAEROBIC 5CC  Final   Culture   Final    NO GROWTH 5 DAYS Performed at [facility]  Report Status 07/01/2016 FINAL  Final  MRSA PCR Screening     Status: None   Collection Time: 06/26/16  3:44 AM  Result Value Ref Range Status   MRSA by PCR NEGATIVE NEGATIVE Final    Comment:        The GeneXpert MRSA Assay (FDA approved for NASAL specimens only), is one component of a comprehensive MRSA colonization surveillance program. It is not intended to diagnose MRSA infection nor to guide or monitor treatment for MRSA infections.   Culture, sputum-assessment     Status: None   Collection Time: 06/26/16  2:34 PM  Result Value Ref Range Status   Specimen Description SPUTUM  Final   Special Requests NONE  Final   Sputum evaluation   Final    THIS SPECIMEN IS ACCEPTABLE. RESPIRATORY CULTURE REPORT TO FOLLOW.   Report Status 06/26/2016 FINAL  Final  Culture, respiratory (NON-Expectorated)     Status: None    Collection Time: 06/26/16  2:34 PM  Result Value Ref Range Status   Specimen Description SPUTUM  Final   Special Requests NONE  Final   Gram Stain   Final    MODERATE WBC PRESENT, PREDOMINANTLY PMN FEW GRAM POSITIVE COCCI IN PAIRS FEW GRAM NEGATIVE RODS FEW GRAM POSITIVE RODS    Culture   Final    Consistent with normal respiratory flora. Performed at [facility]    Report Status 06/28/2016 FINAL  Final         Radiology Studies: No results found.      Scheduled Meds: . dolutegravir  50 mg Oral Daily  . DULoxetine  30 mg Oral Daily  . emtricitabine-tenofovir AF  1 tablet Oral Daily  . famotidine  40 mg Oral QHS  . fluconazole  100 mg Oral Daily  . folic acid  1 mg Oral Daily  . lactose free nutrition  1 Container Oral BID BM  . mouth rinse  15 mL Mouth Rinse BID  . mometasone-formoterol  2 puff Inhalation BID  . morphine  15 mg Oral Q12H  . multivitamin with minerals  1 tablet Oral Daily  . nystatin  5 mL Oral QID  . pantoprazole  40 mg Oral Q1200  . polyethylene glycol  34 g Oral BID  . sucralfate  1 g Oral TID WC & HS  . sulfamethoxazole-trimethoprim  1 tablet Oral Once per day on Mon Wed Fri  . Tbo-filgastrim (GRANIX) SQ  480 mcg Subcutaneous q1800  . tiotropium  18 mcg Inhalation Daily   Continuous Infusions:     LOS: 8 days    Time spent: 22 minutes    Dron Tanna Furry, MD [facility] Pager [phone]  If 7PM-7AM, please contact night-coverage [email or web address] Password [email or web address] 07/04/2016, 4:00 PM

## 2016-07-05 DIAGNOSIS — R5081 Fever presenting with conditions classified elsewhere: Secondary | ICD-10-CM

## 2016-07-05 DIAGNOSIS — E876 Hypokalemia: Secondary | ICD-10-CM

## 2016-07-05 DIAGNOSIS — K209 Esophagitis, unspecified: Secondary | ICD-10-CM

## 2016-07-05 DIAGNOSIS — T451X5A Adverse effect of antineoplastic and immunosuppressive drugs, initial encounter: Secondary | ICD-10-CM

## 2016-07-05 LAB — CBC
HCT: 28.3 % — ABNORMAL LOW (ref 39.0–52.0)
Hemoglobin: 9.5 g/dL — ABNORMAL LOW (ref 13.0–17.0)
MCH: 28.7 pg (ref 26.0–34.0)
MCHC: 33.6 g/dL (ref 30.0–36.0)
MCV: 85.5 fL (ref 78.0–100.0)
PLATELETS: 26 10*3/uL — AB (ref 150–400)
RBC: 3.31 MIL/uL — AB (ref 4.22–5.81)
RDW: 15.1 % (ref 11.5–15.5)
WBC: 2.6 10*3/uL — ABNORMAL LOW (ref 4.0–10.5)

## 2016-07-05 LAB — CBC WITH DIFFERENTIAL/PLATELET
BASOS ABS: 0 10*3/uL (ref 0.0–0.1)
BASOS PCT: 0 %
EOS ABS: 0 10*3/uL (ref 0.0–0.7)
Eosinophils Relative: 1 %
HCT: 21.4 % — ABNORMAL LOW (ref 39.0–52.0)
Hemoglobin: 6.9 g/dL — CL (ref 13.0–17.0)
LYMPHS ABS: 0.6 10*3/uL — AB (ref 0.7–4.0)
Lymphocytes Relative: 36 %
MCH: 28.5 pg (ref 26.0–34.0)
MCHC: 32.2 g/dL (ref 30.0–36.0)
MCV: 88.4 fL (ref 78.0–100.0)
MONO ABS: 0.1 10*3/uL (ref 0.1–1.0)
Monocytes Relative: 8 %
NEUTROS ABS: 1.1 10*3/uL — AB (ref 1.7–7.7)
Neutrophils Relative %: 55 %
PLATELETS: 20 10*3/uL — AB (ref 150–400)
RBC: 2.42 MIL/uL — ABNORMAL LOW (ref 4.22–5.81)
RDW: 15.8 % — AB (ref 11.5–15.5)
WBC: 1.8 10*3/uL — ABNORMAL LOW (ref 4.0–10.5)

## 2016-07-05 LAB — PREPARE RBC (CROSSMATCH)

## 2016-07-05 MED ORDER — SODIUM CHLORIDE 0.9 % IV SOLN
Freq: Once | INTRAVENOUS | Status: AC
  Administered 2016-07-05: 12:00:00 via INTRAVENOUS

## 2016-07-05 MED ORDER — ACETAMINOPHEN 325 MG PO TABS
650.0000 mg | ORAL_TABLET | Freq: Once | ORAL | Status: AC
  Administered 2016-07-05: 650 mg via ORAL
  Filled 2016-07-05: qty 2

## 2016-07-05 MED ORDER — DIPHENHYDRAMINE HCL 25 MG PO CAPS
25.0000 mg | ORAL_CAPSULE | Freq: Once | ORAL | Status: AC
  Administered 2016-07-05: 25 mg via ORAL
  Filled 2016-07-05: qty 1

## 2016-07-05 MED ORDER — SODIUM CHLORIDE 0.9 % IV SOLN
Freq: Once | INTRAVENOUS | Status: AC
  Administered 2016-07-05: 17:00:00 via INTRAVENOUS

## 2016-07-05 MED ORDER — FUROSEMIDE 10 MG/ML IJ SOLN
20.0000 mg | Freq: Once | INTRAMUSCULAR | Status: AC
  Administered 2016-07-05: 20 mg via INTRAVENOUS
  Filled 2016-07-05: qty 2

## 2016-07-05 NOTE — Progress Notes (Signed)
Jeffrey Gutierrez   DOB:1956-09-02   TM#:[id number]   GYB#:[id number]  Oncology follow-up  Subjective: Patient feels well, denies pain or other new symptoms, afebrile, completed antibiotics course on 10/16. He received 2u plt on 10/15 for plt 9, no bleeding, and he is getting RBC today for Hb 6.9. Still on granix, ANC has gradually improved, 1.1 today.   Objective:  Vitals:   07/05/16 1733 07/05/16 1900  BP: 126/77 (!) 146/82  Pulse: 76 78  Resp: 18 18  Temp: 98 F (36.7 C) 98.1 F (36.7 C)    Body mass index is 16.52 kg/m.  Intake/Output Summary (Last 24 hours) at 07/05/16 2000 Last data filed at 07/05/16 1949  Gross per 24 hour  Intake             1950 ml  Output                0 ml  Net             1950 ml     Sclerae unicteric  Oropharynx clear  No peripheral adenopathy  Lungs clear -- no rales or rhonchi  Heart regular rate and rhythm  Abdomen benign  MSK no focal spinal tenderness, no peripheral edema  Neuro nonfocal    CBG (last 3)  No results for input(s): GLUCAP in the last 72 hours.   Labs:  CBC Latest Ref Rng & Units 07/05/2016 07/04/2016 07/03/2016  WBC 4.0 - 10.5 K/uL 1.8(L) 1.4(LL) 0.9(LL)  Hemoglobin 13.0 - 17.0 g/dL 6.9(LL) 7.3(L) 7.4(L)  Hematocrit 39.0 - 52.0 % 21.4(L) 22.1(L) 22.1(L)  Platelets 150 - 400 K/uL 20(LL) 33(L) 40(L)    CMP Latest Ref Rng & Units 07/04/2016 07/03/2016 07/01/2016  Glucose 65 - 99 mg/dL 89 87 92  BUN 6 - 20 mg/dL '13 13 13  '$ Creatinine 0.61 - 1.24 mg/dL 0.84 0.77 0.76  Sodium 135 - 145 mmol/L 140 137 138  Potassium 3.5 - 5.1 mmol/L 4.6 4.0 4.8  Chloride 101 - 111 mmol/L 108 104 105  CO2 22 - 32 mmol/L '29 27 29  '$ Calcium 8.9 - 10.3 mg/dL 8.3(L) 8.1(L) 8.3(L)  Total Protein 6.5 - 8.1 g/dL - - -  Total Bilirubin 0.3 - 1.2 mg/dL - - -  Alkaline Phos 38 - 126 U/L - - -  AST 15 - 41 U/L - - -  ALT 17 - 63 U/L - - -     Urine Studies No results for input(s): UHGB, CRYS in the last 72 hours.  Invalid input(s): UACOL,  UAPR, USPG, UPH, UTP, UGL, UKET, UBIL, UNIT, UROB, [place], UEPI, UWBC, [address], [place], [place]  Basic Metabolic Panel:  Recent Labs Lab 06/29/16 0350 06/30/16 0400 07/01/16 0457 07/03/16 0700 07/04/16 0345  NA 138 138 138 137 140  K 3.4* 3.8 4.8 4.0 4.6  CL 106 106 105 104 108  CO2 '28 29 29 27 29  '$ GLUCOSE 113* 120* 92 87 89  BUN '7 9 13 13 13  '$ CREATININE 0.57* 0.66 0.76 0.77 0.84  CALCIUM 7.6* 7.9* 8.3* 8.1* 8.3*  MG 1.9 1.6* 1.9 1.6*  --    GFR Estimated Creatinine Clearance: 77 mL/min (by C-G formula based on SCr of 0.84 mg/dL). Liver Function Tests: No results for input(s): AST, ALT, ALKPHOS, BILITOT, PROT, ALBUMIN in the last 168 hours. No results for input(s): LIPASE, AMYLASE in the last 168 hours. No results for input(s): AMMONIA in the last 168 hours. Coagulation profile No results for input(s): INR,  PROTIME in the last 168 hours.  CBC:  Recent Labs Lab 07/01/16 0457 07/02/16 0500 07/03/16 0700 07/04/16 0345 07/05/16 0450  WBC 0.6* 0.6* 0.9* 1.4* 1.8*  NEUTROABS 0.1* 0.1* 0.4* 0.7* 1.1*  HGB 8.1* 7.9* 7.4* 7.3* 6.9*  HCT 23.7* 23.8* 22.1* 22.1* 21.4*  MCV 85.9 85.6 84.4 88.0 88.4  PLT 15* 9* 40* 33* 20*   Cardiac Enzymes: No results for input(s): CKTOTAL, CKMB, CKMBINDEX, TROPONINI in the last 168 hours. BNP: Invalid input(s): POCBNP CBG: No results for input(s): GLUCAP in the last 168 hours. D-Dimer No results for input(s): DDIMER in the last 72 hours. Hgb A1c No results for input(s): HGBA1C in the last 72 hours. Lipid Profile No results for input(s): CHOL, HDL, LDLCALC, TRIG, CHOLHDL, LDLDIRECT in the last 72 hours. Thyroid function studies No results for input(s): TSH, T4TOTAL, T3FREE, THYROIDAB in the last 72 hours.  Invalid input(s): FREET3 Anemia work up No results for input(s): VITAMINB12, FOLATE, FERRITIN, TIBC, IRON, RETICCTPCT in the last 72 hours. Microbiology Recent Results (from the past 240 hour(s))  Culture, blood (routine x  2) Call MD if unable to obtain prior to antibiotics being given     Status: None   Collection Time: 06/26/16  2:00 AM  Result Value Ref Range Status   Specimen Description BLOOD LEFT ANTECUBITAL  Final   Special Requests BOTTLES DRAWN AEROBIC AND ANAEROBIC 5CC  Final   Culture   Final    NO GROWTH 5 DAYS Performed at [facility]    Report Status 07/01/2016 FINAL  Final  Culture, blood (routine x 2) Call MD if unable to obtain prior to antibiotics being given     Status: None   Collection Time: 06/26/16  2:02 AM  Result Value Ref Range Status   Specimen Description BLOOD RIGHT FOREARM  Final   Special Requests BOTTLES DRAWN AEROBIC AND ANAEROBIC 5CC  Final   Culture   Final    NO GROWTH 5 DAYS Performed at [facility]    Report Status 07/01/2016 FINAL  Final  MRSA PCR Screening     Status: None   Collection Time: 06/26/16  3:44 AM  Result Value Ref Range Status   MRSA by PCR NEGATIVE NEGATIVE Final    Comment:        The GeneXpert MRSA Assay (FDA approved for NASAL specimens only), is one component of a comprehensive MRSA colonization surveillance program. It is not intended to diagnose MRSA infection nor to guide or monitor treatment for MRSA infections.   Culture, sputum-assessment     Status: None   Collection Time: 06/26/16  2:34 PM  Result Value Ref Range Status   Specimen Description SPUTUM  Final   Special Requests NONE  Final   Sputum evaluation   Final    THIS SPECIMEN IS ACCEPTABLE. RESPIRATORY CULTURE REPORT TO FOLLOW.   Report Status 06/26/2016 FINAL  Final  Culture, respiratory (NON-Expectorated)     Status: None   Collection Time: 06/26/16  2:34 PM  Result Value Ref Range Status   Specimen Description SPUTUM  Final   Special Requests NONE  Final   Gram Stain   Final    MODERATE WBC PRESENT, PREDOMINANTLY PMN FEW GRAM POSITIVE COCCI IN PAIRS FEW GRAM NEGATIVE RODS FEW GRAM POSITIVE RODS    Culture   Final    Consistent with normal  respiratory flora. Performed at [facility]    Report Status 06/28/2016 FINAL  Final  Studies:  No results found.  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  1. Acute respiratory failure with hypoxia secondary to aspiration pneumonia versus HCAP,much improving, off antibiotics now  2. Pancytopenia secondary to chemotherapy, severe neutropenia, improving, worsening anemia and thrombocytopenia 3. Metastatic lung adenocarcinoma, status post 2 cycle chemotherapy, last on 06/20/2069 4. Newly discovered HIV infection 5. History of COPD 6. PBC and early liver cirrhosis 7. Chronic pain, secondary to his metastatic lung cancer  Recommendations: -His neutropenia has improved, hopefully his Marks will get close to 1.5, and we can stop Granix  -If his plt does not get worse tomorrow, OK to discharge, from oncology standpoint -He needs close monitoring for CBC, I suggest him to repeat CBC in 3-5 days, then weekly at his PCP office until he returns to [facility] for follow up  -please copy his discharge summary to his oncologist Dr. Mariel Kansky, I think his chemo dose or regimen needs to be adjusted due to his severe pancytopenia and infection  -please call me if you have questions.    Truitt Merle, MD 07/05/2016

## 2016-07-05 NOTE — Progress Notes (Signed)
CRITICAL VALUE ALERT  Critical value received:  Hgb 6.9    Platelets 20  Date of notification:  07/05/16  Time of notification:  0522  Critical value read back: Yes  Nurse who received alert:  Nonie Hoyer  MD notified (1st page):  Baltazar Najjar  Time of first page:  [phone]  Responding MD:  Baltazar Najjar  Time MD responded:  [phone]

## 2016-07-05 NOTE — Progress Notes (Signed)
OT Cancellation Note  Patient Details Name: GARLIN BATDORF MRN: [id number] DOB: Feb 02, 1956   Cancelled Treatment:    Reason Eval/Treat Not Completed: OT screened, no needs identified, will sign off  Per PT pt is mod I with ADL activity  [place], Thereasa Parkin 07/05/2016, 11:51 AM

## 2016-07-05 NOTE — Progress Notes (Signed)
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 Patient developed neutropenia and has HIV 1 antibody positive. Oncology and infectious disease consult evaluated the patient. Clinically better.  Assessment & Plan:   Principal Problem:   Aspiration pneumonia of both lower lobes (HCC) Active Problems:   Adenocarcinoma of right lung, stage 4 (HCC)   Acute respiratory failure with hypoxia (HCC)   Aspiration into airway   Hypoxia   Shortness of breath   Hypokalemia   Neutropenia with fever (HCC)   Pressure injury of skin   HIV disease ([place])  #1 acute respiratory failure with hypoxia secondary to aspiration pneumonia versus HCAP Clinical improvement. Patient status post course of oral antibiotics. Currently afebrile. Continue bronchodilators. Follow.  #2 pancytopenia/neutropenia due to chemotherapy and HIV White count currently at 1.8 and a platelet count of 20 with a hemoglobin of 6.9. Patient with no overt bleeding. Patient status post 2 units platelets October 15. Patient currently receiving 2 units of packed red blood cells. Continue Granix until [place] greater than or equal to 1500.  #3 metastatic stage IV adenocarcinoma of the lung Undergoing chemotherapy at [facility]. Follow-up with oncology there.  #4 daily diagnosed HIV/HIV 1 antibody positive.  Patient was unaware of diagnosis before this admission. Patient denied any IV drug use or unprotected sexual interaction. Patient has been started on antiviral medications as well as prophylactic Bactrim per ID. CD4 count likely low as patient has a neutropenia. Will likely need to follow-up with  ID in the outpatient setting.  #5 hypokalemia Resolved.  #6 presumed esophagitis likely Candida Continue Diflucan and nystatin for total of 10 days.  #7 severe protein calorie malnutrition in the setting of malignancy and HIV Continue current nutritional supplementations.  #8 history of COPD/tobacco use Continue Spiriva and Dulera.  #9 gastroesophageal reflux disease Pepcid.  #10 chronic pain medications IV pain medications have been discontinued. Continue current oral morphine as needed.  #11 generalized weakness PT/OT.   DVT prophylaxis: SCD Code Status: Full Family Communication: Updated patient. No family at bedside. Disposition Plan: Home when medically stable.   Consultants:   Oncology: Dr. Burr Medico 06/28/2016  Infectious diseases: Dr.Comer 06/28/2016  Procedures:   2 units packed red blood cells 07/05/2016  CT angiogram chest 06/25/2016  Chest x-ray 06/25/2016,  Modified barium swallow 06/26/2016  2 units platelets 07/02/2016  Antimicrobials:   Augmentin 06/30/2016>>> 07/03/2016  IV Unasyn 06/26/2016>>>>> 06/30/2016  IV cefepime 06/26/2016 1 dose  IV vancomycin 06/26/2016>>>> 06/28/2016  Prophylactic dose of Bactrim 06/30/2016   Subjective: Patient denies any chest pain. No shortness of breath. No abdominal pain. Tolerating current diet.  Objective: Vitals:   07/05/16 0900 07/05/16 1125 07/05/16 1128 07/05/16 1145  BP: 100/60 110/68  102/66  Pulse: 90 92 (!) 112 86  Resp: '18 18  18  '$ Temp: 97.9 F (36.6 C) 97.9 F (36.6 C)  98 F (36.7 C)  TempSrc: Oral   Oral  SpO2: 92%  (!) 88% 93%  Weight:      Height:        Intake/Output Summary (Last 24 hours) at 07/05/16 1235 Last data filed at 07/05/16 1130  Gross per 24 hour  Intake  1488.75 ml  Output                0 ml  Net          1488.75 ml   Filed Weights   06/25/16 2016 06/26/16 0400  Weight: 60.8 kg (134 lb) 56.8 kg (125 lb 3.5 oz)    Examination:  General exam:  Appears calm and comfortable  Respiratory system: Clear to auscultation. Respiratory effort normal. Cardiovascular system: S1 & S2 heard, RRR. No JVD, murmurs, rubs, gallops or clicks. No pedal edema. Gastrointestinal system: Abdomen is nondistended, soft and nontender. No organomegaly or masses felt. Normal bowel sounds heard. Central nervous system: Alert and oriented. No focal neurological deficits. Extremities: Symmetric 5 x 5 power. Skin: No rashes, lesions or ulcers Psychiatry: Judgement and insight appear normal. Mood & affect appropriate.     Data Reviewed: I have personally reviewed following labs and imaging studies  CBC:  Recent Labs Lab 07/01/16 0457 07/02/16 0500 07/03/16 0700 07/04/16 0345 07/05/16 0450  WBC 0.6* 0.6* 0.9* 1.4* 1.8*  NEUTROABS 0.1* 0.1* 0.4* 0.7* 1.1*  HGB 8.1* 7.9* 7.4* 7.3* 6.9*  HCT 23.7* 23.8* 22.1* 22.1* 21.4*  MCV 85.9 85.6 84.4 88.0 88.4  PLT 15* 9* 40* 33* 20*   Basic Metabolic Panel:  Recent Labs Lab 06/29/16 0350 06/30/16 0400 07/01/16 0457 07/03/16 0700 07/04/16 0345  NA 138 138 138 137 140  K 3.4* 3.8 4.8 4.0 4.6  CL 106 106 105 104 108  CO2 '28 29 29 27 29  '$ GLUCOSE 113* 120* 92 87 89  BUN '7 9 13 13 13  '$ CREATININE 0.57* 0.66 0.76 0.77 0.84  CALCIUM 7.6* 7.9* 8.3* 8.1* 8.3*  MG 1.9 1.6* 1.9 1.6*  --    GFR: Estimated Creatinine Clearance: 77 mL/min (by C-G formula based on SCr of 0.84 mg/dL). Liver Function Tests: No results for input(s): AST, ALT, ALKPHOS, BILITOT, PROT, ALBUMIN in the last 168 hours. No results for input(s): LIPASE, AMYLASE in the last 168 hours. No results for input(s): AMMONIA in the last 168 hours. Coagulation Profile: No results for input(s): INR, PROTIME in the last 168 hours. Cardiac Enzymes: No results for input(s): CKTOTAL, CKMB, CKMBINDEX, TROPONINI in the last 168 hours. BNP (last 3 results) No results for input(s): PROBNP in the last 8760 hours. HbA1C: No results for input(s): HGBA1C in  the last 72 hours. CBG:  Recent Labs Lab 06/28/16 1654  GLUCAP 118*   Lipid Profile: No results for input(s): CHOL, HDL, LDLCALC, TRIG, CHOLHDL, LDLDIRECT in the last 72 hours. Thyroid Function Tests: No results for input(s): TSH, T4TOTAL, FREET4, T3FREE, THYROIDAB in the last 72 hours. Anemia Panel: No results for input(s): VITAMINB12, FOLATE, FERRITIN, TIBC, IRON, RETICCTPCT in the last 72 hours. Sepsis Labs: No results for input(s): PROCALCITON, LATICACIDVEN in the last 168 hours.  Recent Results (from the past 240 hour(s))  Culture, blood (routine x 2) Call MD if unable to obtain prior to antibiotics being given     Status: None   Collection Time: 06/26/16  2:00 AM  Result Value Ref Range Status   Specimen Description BLOOD LEFT ANTECUBITAL  Final   Special Requests BOTTLES DRAWN AEROBIC AND ANAEROBIC 5CC  Final   Culture   Final    NO GROWTH 5 DAYS Performed at [facility]    Report Status 07/01/2016 FINAL  Final  Culture, blood (routine x 2) Call MD if unable to obtain prior to antibiotics being given  Status: None   Collection Time: 06/26/16  2:02 AM  Result Value Ref Range Status   Specimen Description BLOOD RIGHT FOREARM  Final   Special Requests BOTTLES DRAWN AEROBIC AND ANAEROBIC 5CC  Final   Culture   Final    NO GROWTH 5 DAYS Performed at [facility]    Report Status 07/01/2016 FINAL  Final  MRSA PCR Screening     Status: None   Collection Time: 06/26/16  3:44 AM  Result Value Ref Range Status   MRSA by PCR NEGATIVE NEGATIVE Final    Comment:        The GeneXpert MRSA Assay (FDA approved for NASAL specimens only), is one component of a comprehensive MRSA colonization surveillance program. It is not intended to diagnose MRSA infection nor to guide or monitor treatment for MRSA infections.   Culture, sputum-assessment     Status: None   Collection Time: 06/26/16  2:34 PM  Result Value Ref Range Status   Specimen Description SPUTUM   Final   Special Requests NONE  Final   Sputum evaluation   Final    THIS SPECIMEN IS ACCEPTABLE. RESPIRATORY CULTURE REPORT TO FOLLOW.   Report Status 06/26/2016 FINAL  Final  Culture, respiratory (NON-Expectorated)     Status: None   Collection Time: 06/26/16  2:34 PM  Result Value Ref Range Status   Specimen Description SPUTUM  Final   Special Requests NONE  Final   Gram Stain   Final    MODERATE WBC PRESENT, PREDOMINANTLY PMN FEW GRAM POSITIVE COCCI IN PAIRS FEW GRAM NEGATIVE RODS FEW GRAM POSITIVE RODS    Culture   Final    Consistent with normal respiratory flora. Performed at [facility]    Report Status 06/28/2016 FINAL  Final         Radiology Studies: No results found.      Scheduled Meds: . sodium chloride   Intravenous Once  . dolutegravir  50 mg Oral Daily  . DULoxetine  30 mg Oral Daily  . emtricitabine-tenofovir AF  1 tablet Oral Daily  . famotidine  40 mg Oral QHS  . fluconazole  100 mg Oral Daily  . folic acid  1 mg Oral Daily  . furosemide  20 mg Intravenous Once  . lactose free nutrition  1 Container Oral BID BM  . mouth rinse  15 mL Mouth Rinse BID  . mometasone-formoterol  2 puff Inhalation BID  . morphine  15 mg Oral Q12H  . multivitamin with minerals  1 tablet Oral Daily  . nystatin  5 mL Oral QID  . pantoprazole  40 mg Oral Q1200  . polyethylene glycol  34 g Oral BID  . sucralfate  1 g Oral TID WC & HS  . sulfamethoxazole-trimethoprim  1 tablet Oral Once per day on Mon Wed Fri  . Tbo-filgastrim (GRANIX) SQ  480 mcg Subcutaneous q1800  . tiotropium  18 mcg Inhalation Daily   Continuous Infusions:    LOS: 9 days    Time spent: 35 minutes    Quadre Bristol, MD [facility] Pager [phone]  If 7PM-7AM, please contact night-coverage [email or web address] Password [email or web address] 07/05/2016, 12:35 PM

## 2016-07-05 NOTE — Progress Notes (Signed)
Nutrition Follow-up  DOCUMENTATION CODES:   Severe malnutrition in context of acute illness/injury, Underweight  INTERVENTION:  - Continue Boost Plus but will increase to TID.  - Continue to encourage PO intakes of meals and supplements. - RD will continue to monitor for additional nutrition-related needs.  NUTRITION DIAGNOSIS:   Increased nutrient needs related to catabolic illness, cancer and cancer related treatments as evidenced by estimated needs. -ongoing  GOAL:   Patient will meet greater than or equal to 90% of their needs -met on average.  MONITOR:   PO intake, Supplement acceptance, Weight trends, Labs, I & O's  ASSESSMENT:   60 y.o. male with medical history significant of stage 4 adenocarcinoma of lung, COPD.  Pt presents to the ED with sudden onset of severe SOB.  He was resting in a chair at home and may have "dozed off" and he had sudden onset of severe SOB.  This occurred about 1.5 hrs PTA earlier this evening.  Before then he had been feeling well.  10/18 Per chart review, pt mainly eating 100% of meals since previous assessment (50-100% of meals yesterday). Per MD note yesterday, possible d/c in 1-2 days from that time. Also per note, pt with new HIV diagnosis (of which patient is unaware). Ongoing suspicion of esophagitis on nystatin and Diflucan. No new weight since 06/26/16.  Medications reviewed; 40 mg oral Pepcid/day, 1 mg folic acid/day, 20 mg IV Lasix x1 dose today, 2 g IV Mg sulfate x1 dose yesterday, daily multivitamin with minerals, PRN IV Zofran, 40 mg oral Protonix/day, 34 g Miralax BID, 1 g Carafate QID.  Labs reviewed; Ca: 8.3 mg/dL.    10/11 - MBS completed on 10/9 after RD visit that AM.  - Pt on Regular diet with thin liquids since MBS; no intakes documented since that time.  - Pt states that for breakfast today he had [place] toast, breakfast potatoes, orange juice, and coffee. - He states his appetite has been good over the past 2 days.  -  Notes indicate possible esophagitis.  - He denies chewing or swallowing difficulties, but states that every time he swallows he feels a tightness/pressure in lower sternal area.  - Pt denies any taste alterations. - Pt has been drinking Boost and likes it to be cold. - Physical assessment shows severe muscle and fat wasting to upper body; lower body not assessed at this time.  IVF: NS-40 mEq KCl @ 75 mL/hr.     10/9 - Pt has been NPO since admission.  - Pt drowsy during RD visit; he does engage in conversation to some extent but wife, who is at bedside, provides the majority of information.  - Last chemo was on Tuesday (10/3). Since Thursday (10/5) he has been experiencing pain with swallowing which is new for him.  - Denies any nausea or abdominal pain now or since last chemo treatment.  - PTA appetite and intakes are variable. A "good day" consists of 2 large meals. A "bad day" consists of only eating one to two small meals such as a bowl of soup, a cup of yogurt, half of a peanut butter and jelly or chicken salad sandwich.  - PTA he was consuming 1-2 bottles of chocolate Boost per day.  - Pt does not like Ensure and only likes chocolate flavor of Boost.  - Will need to ask about chewing and swallowing ability once diet is advanced.  - Pt denies any recent changes in bowel habits.  - Physical assessment deferred  at this time with respect to pt's comfort. Able to visualize severe muscle wasting to upper body.  - Pt reports that prior to starting treatment his UBW was 150 lbs.  - Per chart review, pt has lost 17 lbs (12% body weight) in the past 3 months which is significant for time frame.  IVF:NS @ 100 mL/hr.    Diet Order:  Diet regular Room service appropriate? Yes; Fluid consistency: Thin  Skin:  Reviewed, no issues  Last BM:  10/16  Height:   Ht Readings from Last 1 Encounters:  06/26/16 '6\' 1"'  (1.854 m)    Weight:   Wt Readings from Last 1 Encounters:  06/26/16 125 lb  3.5 oz (56.8 kg)    Ideal Body Weight:  83.64 kg  BMI:  Body mass index is 16.52 kg/m.  Estimated Nutritional Needs:   Kcal:  1875-2100 (33-37 kcal/kg)  Protein:  80-90 grams (1.4-1.6 grams/kg)  Fluid:  >/= 1.9 L/day  EDUCATION NEEDS:   No education needs identified at this time    Jarome Matin, MS, RD, LDN Inpatient Clinical Dietitian Pager # [phone] After hours/weekend pager # [phone]

## 2016-07-05 NOTE — Evaluation (Signed)
Physical Therapy Evaluation Patient Details Name: Jeffrey Gutierrez MRN: [id number] DOB: Mar 20, 1956 Today's Date: 07/05/2016   History of Present Illness  60 y.o. male admitted with aspiration PNA, anemia (receiving one unit of blood today). PMH of stage 4 lung cancer with mets to bone, undergoing chemo at [facility], COPD, diverticulitis, HTN.   Clinical Impression  Pt is independent with mobility, he ambulated 260' without an assistive device, with no loss of balance. SaO2 88% on RA with walking, HR 114 walking. Pt stated he uses supplemental O2 at home PRN. No further PT indicated, will sign off. Encouraged pt to continue ambulating in halls to prevent deconditioning during hospitilization.     Follow Up Recommendations No PT follow up    Equipment Recommendations  None recommended by PT    Recommendations for Other Services       Precautions / Restrictions Precautions Precautions: None Restrictions Weight Bearing Restrictions: No      Mobility  Bed Mobility Overal bed mobility: Independent                Transfers Overall transfer level: Independent                  Ambulation/Gait Ambulation/Gait assistance: Independent Ambulation Distance (Feet): 260 Feet Assistive device: None Gait Pattern/deviations: WFL(Within Functional Limits)   Gait velocity interpretation: at or above normal speed for age/gender General Gait Details: steady, no loss of balance, SaO2 88% on RA walking, HR 114 walking, 1/4 dyspnea   Stairs            Wheelchair Mobility    Modified Rankin (Stroke Patients Only)       Balance Overall balance assessment: Independent (denies h/o falls)                                           Pertinent Vitals/Pain Pain Assessment: No/denies pain Pain Score: 0-No pain    Home Living Family/patient expects to be discharged to:: Private residence Living Arrangements: Spouse/significant other Available Help at Discharge:  Family Type of Home: Mobile home Home Access: Stairs to enter Entrance Stairs-Rails: Can reach both Entrance Stairs-Number of Steps: 3 Home Layout: One level Home Equipment: Other (comment) (oxygen -uses prn) Additional Comments: Pt sleeps in his recliner.     Prior Function Level of Independence: Independent               Hand Dominance   Dominant Hand: Right    Extremity/Trunk Assessment   Upper Extremity Assessment: Overall WFL for tasks assessed           Lower Extremity Assessment: Overall WFL for tasks assessed      Cervical / Trunk Assessment: Kyphotic (forward head)  Communication   Communication: No difficulties  Cognition Arousal/Alertness: Awake/alert Behavior During Therapy: WFL for tasks assessed/performed Overall Cognitive Status: Within Functional Limits for tasks assessed                      General Comments      Exercises     Assessment/Plan    PT Assessment Patent does not need any further PT services  PT Problem List            PT Treatment Interventions      PT Goals (Current goals can be found in the Care Plan section)  Acute Rehab PT Goals PT Goal Formulation: All  assessment and education complete, DC therapy    Frequency     Barriers to discharge        Co-evaluation               End of Session Equipment Utilized During Treatment: Gait belt Activity Tolerance: Patient tolerated treatment well Patient left: in bed;with call bell/phone within reach;with nursing/sitter in room Nurse Communication: Mobility status         Time: 1610-9604 PT Time Calculation (min) (ACUTE ONLY): 8 min   Charges:   PT Evaluation $PT Eval Low Complexity: 1 Procedure     PT G Codes:        Philomena Doheny 07/05/2016, 11:33 AM [phone]

## 2016-07-06 DIAGNOSIS — R1314 Dysphagia, pharyngoesophageal phase: Secondary | ICD-10-CM

## 2016-07-06 DIAGNOSIS — R0602 Shortness of breath: Secondary | ICD-10-CM

## 2016-07-06 LAB — CBC WITH DIFFERENTIAL/PLATELET
BASOS ABS: 0 10*3/uL (ref 0.0–0.1)
BASOS PCT: 0 %
Eosinophils Absolute: 0 10*3/uL (ref 0.0–0.7)
Eosinophils Relative: 1 %
HEMATOCRIT: 29.8 % — AB (ref 39.0–52.0)
HEMOGLOBIN: 10.2 g/dL — AB (ref 13.0–17.0)
LYMPHS PCT: 26 %
Lymphs Abs: 0.9 10*3/uL (ref 0.7–4.0)
MCH: 29.9 pg (ref 26.0–34.0)
MCHC: 34.2 g/dL (ref 30.0–36.0)
MCV: 87.4 fL (ref 78.0–100.0)
MONOS PCT: 6 %
Monocytes Absolute: 0.2 10*3/uL (ref 0.1–1.0)
NEUTROS ABS: 2.3 10*3/uL (ref 1.7–7.7)
NEUTROS PCT: 67 %
Platelets: 31 10*3/uL — ABNORMAL LOW (ref 150–400)
RBC: 3.41 MIL/uL — ABNORMAL LOW (ref 4.22–5.81)
RDW: 15.6 % — ABNORMAL HIGH (ref 11.5–15.5)
WBC: 3.4 10*3/uL — ABNORMAL LOW (ref 4.0–10.5)

## 2016-07-06 LAB — MAGNESIUM: Magnesium: 2 mg/dL (ref 1.7–2.4)

## 2016-07-06 LAB — BASIC METABOLIC PANEL
Anion gap: 7 (ref 5–15)
BUN: 16 mg/dL (ref 6–20)
CHLORIDE: 100 mmol/L — AB (ref 101–111)
CO2: 30 mmol/L (ref 22–32)
CREATININE: 1.09 mg/dL (ref 0.61–1.24)
Calcium: 8.9 mg/dL (ref 8.9–10.3)
GFR calc non Af Amer: >60 mL/min (ref >60)
Glucose, Bld: 90 mg/dL (ref 65–99)
POTASSIUM: 4.3 mmol/L (ref 3.5–5.1)
Sodium: 137 mmol/L (ref 135–145)

## 2016-07-06 LAB — TYPE AND SCREEN
ABO/RH(D): O POS
Antibody Screen: NEGATIVE
Unit division: 0
Unit division: 0

## 2016-07-06 MED ORDER — ONDANSETRON HCL 4 MG/2ML IJ SOLN: 4.0000 mg | Freq: Four times a day (QID) | INTRAMUSCULAR | 0 refills | Status: DC | PRN

## 2016-07-06 MED ORDER — EMTRICITABINE-TENOFOVIR AF 200-25 MG PO TABS: 1.0000 | ORAL_TABLET | Freq: Every day | ORAL | 3 refills | Status: DC

## 2016-07-06 MED ORDER — FLUCONAZOLE 100 MG PO TABS: 100.0000 mg | ORAL_TABLET | Freq: Every day | ORAL | 0 refills | Status: AC

## 2016-07-06 MED ORDER — HEPARIN SOD (PORK) LOCK FLUSH 100 UNIT/ML IV SOLN
500.0000 [IU] | INTRAVENOUS | Status: AC | PRN
  Administered 2016-07-06: 500 [IU]

## 2016-07-06 MED ORDER — FAMOTIDINE 40 MG PO TABS: 40.0000 mg | ORAL_TABLET | Freq: Every day | ORAL | Status: DC

## 2016-07-06 MED ORDER — SULFAMETHOXAZOLE-TRIMETHOPRIM 800-160 MG PO TABS: 1.0000 | ORAL_TABLET | ORAL | 3 refills | Status: DC

## 2016-07-06 MED ORDER — NYSTATIN 100000 UNIT/ML MT SUSP: 5.0000 mL | Freq: Four times a day (QID) | OROMUCOSAL | 0 refills | Status: AC

## 2016-07-06 MED ORDER — DOLUTEGRAVIR SODIUM 50 MG PO TABS: 50.0000 mg | ORAL_TABLET | Freq: Every day | ORAL | 3 refills | Status: AC

## 2016-07-06 MED ORDER — HYDROCORTISONE 1 % EX CREA: TOPICAL_CREAM | CUTANEOUS | 0 refills | Status: DC | PRN

## 2016-07-06 NOTE — Progress Notes (Signed)
Pt discharged home. VSS. Discharge instructions with prescriptions given.

## 2016-07-06 NOTE — Discharge Summary (Signed)
Physician Discharge Summary  Jeffrey Gutierrez CZY:[id number] DOB: [birth year] DOA: 06/25/2016  PCP: Purvis Kilts, MD  Admit date: 06/25/2016 Discharge date: 07/06/2016  Time spent: 65 minutes  Recommendations for Outpatient Follow-up:  1. Follow-up with Dr. Mariel Kansky, oncology [facility] as scheduled on 07/11/2016. On follow-up patient will need a CBC with differential to follow-up on his counts. 2. Follow-up with Dr. Megan Salon, infectious diseases on 07/20/2016 at 3:15 PM for follow-up with newly diagnosed HIV. 3. Follow-up with Purvis Kilts, MD in 1-2 weeks. On follow-up patient will need a basic metabolic profile done to follow-up on electrolytes and renal function.   Discharge Diagnoses:  Principal Problem:   Aspiration pneumonia of both lower lobes (HCC) Active Problems:   Adenocarcinoma of right lung, stage 4 (HCC)   Acute respiratory failure with hypoxia (HCC)   Aspiration into airway   Hypoxia   Shortness of breath   Hypokalemia   Neutropenia with fever (HCC)   Pressure injury of skin   HIV disease ([place])   Dysphagia, pharyngoesophageal phase   Discharge Condition: Stable and improved  Diet recommendation: Regular  Filed Weights   06/25/16 2016 06/26/16 0400  Weight: 60.8 kg (134 lb) 56.8 kg (125 lb 3.5 oz)    History of present illness:  Per Dr Orvil Feil is a 60 y.o. male with medical history significant of stage 4 adenocarcinoma of lung, COPD.  Pt presents to the ED with sudden onset of severe SOB.  He was resting in a chair at home and may have "dozed off" and he had sudden onset of severe SOB.  This occurred about 1.5 hrs PTA earlier the evening of admission.  Before then he had been feeling well.  No associated cough, no CP.  ED Course: CT chest shows BLL aspiration, as well as unchanged tumor.   Hospital Course:  #1 acute respiratory failure with hypoxia secondary to aspiration pneumonia versus HCAP Patient was admitted with acute respiratory  failure with hypoxia felt to be secondary to aspiration pneumonia versus a healthcare associated pneumonia. CT angiogram chest which was done on admission was negative for pulmonary emboli and consistent with aspiration pneumonia and bilateral lower lobes as well as near complete opacification of the bronchioles. COPD changes particularly at the lung apices. Diffuse wall thickening along the esophagus concerning for esophagitis. Right-sided lung cancer slightly less well characterized than on prior study with diffuse wall thickening again noted along the posteromedial aspect of the right hemithorax. Underlying osseous metastatic changes again noted most prominent at T5.  Patient was placed empirically on IV vancomycin and IV Unasyn. Patient received a dose of IV cefepime. Patient improved clinically during the hospitalization was subsequently transitioned to oral Augmentin to finish a course of antibiotic treatment during the hospitalization. Patient improved clinically such that by day of discharge patient was satting 94% on room air on ambulation. Patient be discharged in stable and improved condition. Outpatient follow-up with PCP.   #2 pancytopenia/neutropenia due to chemotherapy and HIV During the hospitalization patient was noted to have a pancytopenia which was felt likely to be secondary to recent chemotherapy. Oncology was consulted and patient was seen in consultation by Dr. Burr Medico. Per oncology was noted that patient had a severe neutropenia with ANC of 0.3 patient was also being treated for bilateral pneumonia with hypoxia and Granix 300 MCG's daily was recommended until Mogul was greater than or equal to 1500. Patient was also transfused 2 units of platelets on 07/02/2016 when his platelet count  dropped to 9. Patient was also transfused 2 units packed red blood cells when his hemoglobin went down to 6.9. Patient's counts improved with white count common up to 3.4 with ANC of 2.3 hemoglobin was up to  10.2 and a platelet count of 31 by day of discharge. Patient be discharged home in stable and improved condition. Patient is to follow-up with his oncologist, Dr. Mariel Kansky as scheduled 07/11/2016 with patient will need a repeat CBC with differential done to follow-up on his counts. It was recommended per oncology that for patient's next chemotherapy cycle may need Neulasta. Patient improved clinically and will be discharged in stable and improved condition.  #3 metastatic stage IV adenocarcinoma of the lung Undergoing chemotherapy at [facility]. Follow-up with oncology.  #4 daily diagnosed HIV/HIV 1 antibody positive.  Patient was unaware of diagnosis before this admission. Patient denied any IV drug use or unprotected sexual interaction. Patient was noted to have HIV 1 antibody positive. Viral load was pending at time of discharge and patient had a fourth generation test confirmed already.  ID consultation was obtained and patient was started on antiviral medications as well as prophylactic Bactrim per ID. CD4 count was 40. Viral load was pending at time of discharge. Patient will follow-up with ID in the outpatient setting.  #5 hypokalemia Resolved.  #6 presumed esophagitis likely Candida During the hospitalization patient had complaints of odynophagia. It was presumed that patient's 09 aphasia likely was secondary to presumed Candida esophagitis. Patient was placed on nystatin and started on Diflucan as well as Carafate with clinical improvement. Patient be discharged home on 4 more days of Diflucan and nystatin to complete a ten-day course of treatment.   #7 severe protein calorie malnutrition in the setting of malignancy and HIV Patient was maintained on nutritional supplementations.  #8 history of COPD/tobacco use Patient was placed on Spiriva and Dulera as well as bronchodilators as needed. Patient's oxygenation improved with treatment of his pneumonia such that by day of discharge patient's  sats were 94% on room air on ambulation.   #9 gastroesophageal reflux disease Patient was maintained on Pepcid.  #10 chronic pain medications Patient was initially placed on IV pain medications for pain control. As patient's pain improved patient was transitioned back to home regimen of oral morphine. Patient's pain remained well-controlled during the hospitalization.   #11 generalized weakness Patient was seen by physical therapy during the hospitalization and improved clinically. It was felt patient had no PT needs on discharge.   Procedures:  2 units packed red blood cells 07/05/2016  CT angiogram chest 06/25/2016  Chest x-ray 06/25/2016,  Modified barium swallow 06/26/2016  2 units platelets 07/02/2016  Consultations:  Oncology: Dr. Burr Medico 06/28/2016  Infectious diseases: Dr.Comer 06/28/2016  Discharge Exam: Vitals:   07/06/16 0515 07/06/16 1513  BP: 138/88 126/82  Pulse: 82 75  Resp: 18 18  Temp: 98.4 F (36.9 C) 98.2 F (36.8 C)    General: NAD Cardiovascular: RRR Respiratory: CTAB  Discharge Instructions   Discharge Instructions    Diet general    Complete by:  As directed    Increase activity slowly    Complete by:  As directed      Discharge Medication List as of 07/06/2016  3:52 PM    START taking these medications   Details  dolutegravir (TIVICAY) 50 MG tablet Take 1 tablet (50 mg total) by mouth daily., Starting Fri 07/07/2016, Print    emtricitabine-tenofovir AF (DESCOVY) 200-25 MG tablet Take 1 tablet by mouth  daily., Starting Fri 07/07/2016, Print    famotidine (PEPCID) 40 MG tablet Take 1 tablet (40 mg total) by mouth at bedtime., Starting Thu 07/06/2016, No Print    fluconazole (DIFLUCAN) 100 MG tablet Take 1 tablet (100 mg total) by mouth daily., Starting Fri 07/07/2016, Until Tue 07/11/2016, Print    hydrocortisone cream 1 % Apply topically as needed for itching., Starting Thu 07/06/2016, Print    ondansetron (ZOFRAN) 4 MG/2ML  SOLN injection Inject 2 mLs (4 mg total) into the vein every 6 (six) hours as needed for nausea or vomiting., Starting Thu 07/06/2016, Print    sulfamethoxazole-trimethoprim (BACTRIM DS,SEPTRA DS) 800-160 MG tablet Take 1 tablet by mouth 3 (three) times a week., Starting Fri 07/07/2016, Print      CONTINUE these medications which have CHANGED   Details  nystatin (MYCOSTATIN) 100000 UNIT/ML suspension Take 5 mLs (500,000 Units total) by mouth 4 (four) times daily., Starting Thu 07/06/2016, Until Mon 07/10/2016, Print      CONTINUE these medications which have NOT CHANGED   Details  albuterol (PROVENTIL HFA;VENTOLIN HFA) 108 (90 Base) MCG/ACT inhaler Inhale 2 puffs into the lungs every 4 (four) hours as needed for wheezing or shortness of breath., Starting 09/17/2015, Until Discontinued, Print    budesonide-formoterol (SYMBICORT) 160-4.5 MCG/ACT inhaler Inhale 2 puffs into the lungs 2 (two) times daily., Until Discontinued, Historical Med    DULoxetine (CYMBALTA) 30 MG capsule Take 30 mg by mouth every morning. , Starting Tue 05/09/2016, Historical Med    feeding supplement (BOOST HIGH PROTEIN) LIQD Take 1 Container by mouth 2 (two) times daily between meals., Historical Med    folic acid (FOLVITE) 1 MG tablet Take 1 mg by mouth every morning. , Historical Med    furosemide (LASIX) 20 MG tablet Take 20-40 tablets by mouth 2 (two) times daily. 40 mg in the morning and 20 mg at bedtime., Starting Fri 05/12/2016, Historical Med    ibuprofen (ADVIL,MOTRIN) 200 MG tablet Take 200 mg by mouth every 6 (six) hours as needed for mild pain or moderate pain. Reported on 12/14/2015, Until Discontinued, Historical Med    morphine (MS CONTIN) 15 MG 12 hr tablet Take 1 tablet (15 mg total) by mouth every 12 (twelve) hours., Starting Thu 04/13/2016, Print    morphine (MSIR) 15 MG tablet Take 1 tablet (15 mg total) by mouth every 4 (four) hours as needed for severe pain., Starting Thu 04/13/2016, Print     Multiple Vitamin (MULTIVITAMIN WITH MINERALS) TABS tablet Take 1 tablet by mouth every morning. , Historical Med    OCALIVA 5 MG TABS Take 1 tablet by mouth daily., Starting 03/02/2016, Until Discontinued, Normal    ondansetron (ZOFRAN) 8 MG tablet Take 1 tablet (8 mg total) by mouth every 8 (eight) hours as needed for nausea or vomiting., Starting Mon 04/03/2016, Normal    polyethylene glycol (MIRALAX / GLYCOLAX) packet Take 34 g by mouth 2 (two) times daily. , Until Discontinued, Historical Med    tiotropium (SPIRIVA) 18 MCG inhalation capsule Place 18 mcg into inhaler and inhale every morning. , Historical Med    sucralfate (CARAFATE) 1 g tablet TAKE 1 TABLET BY MOUTH 4 TIMES A DAY WITH MEALS AND AT BEDTIME, Normal      STOP taking these medications     omeprazole (PRILOSEC) 20 MG capsule        Allergies  Allergen Reactions  . Dihydrocodeine Anaphylaxis  . Hydrocodone Anaphylaxis and Other (See Comments)    Throat closes up  Patient states PCP gave 11/2015 and he has been tolerating it.  . Doxycycline Nausea And Vomiting  . Linzess [Linaclotide] Diarrhea   Follow-up Information    Purvis Kilts, MD. Schedule an appointment as soon as possible for a visit today.   Specialty:  Family Medicine Why:  F/U IN 1-2 WEEKS. Contact information: [address] [place] [address] [phone]        Rickard Rhymes, MD. Schedule an appointment as soon as possible for a visit on 07/11/2016.   Specialty:  Oncology Why:  F/U AS SCHEDULED NEXT WEEK. WILL NEED CBC WITH DIFF ON FOLLOW UP. Contact information: [address] [facility] [id number] Physician Office Building [place] [address] [phone]        Michel Bickers, MD Follow up on 07/20/2016.   Specialty:  Infectious Diseases Why:  F/U AT 3:15PM Contact information: [address] [place] [address] [phone]            The results of significant diagnostics from this  hospitalization (including imaging, microbiology, ancillary and laboratory) are listed below for reference.    Significant Diagnostic Studies: Dg Chest 2 View  Result Date: 06/25/2016 CLINICAL DATA:  Shortness of breath. EXAM: CHEST  2 VIEW COMPARISON:  06/07/2016 FINDINGS: The right-sided IJ power port is in stable position. The heart is normal in size. The mediastinal and hilar contours are normal and stable. Stable advanced emphysematous changes and pulmonary scarring. No acute overlying pulmonary process. Pleural lesions seen on the prior PET-CT are not well visualized on this study. The bony thorax is intact. IMPRESSION: Stable advanced emphysematous changes but no acute overlying pulmonary process. Electronically Signed   By: Marijo Sanes M.D.   On: 06/25/2016 20:55   Dg Chest 2 View  Result Date: 06/07/2016 CLINICAL DATA:  Near syncope EXAM: CHEST  2 VIEW COMPARISON:  04/27/2016 chest x-ray FINDINGS: Porta catheter on the right with loose kink at the level of the clavicle crossing. Catheter tip is at the SVC level. Normal heart size and mediastinal contours. Emphysema. There is no edema, consolidation, effusion, or pneumothorax. Nodular density over the right chest wall could be pulmonary or nipple shadow based on 04/27/2016 chest CT. Patient's right thoracic mass and spinal erosion are obscured. IMPRESSION: No evidence of acute disease. Electronically Signed   By: Monte Fantasia M.D.   On: 06/07/2016 13:51   Ct Angio Chest Pe W Or Wo Contrast  Result Date: 06/26/2016 CLINICAL DATA:  Acute onset of worsening shortness of breath. Known stage IV lung cancer, on chemotherapy. Initial encounter. EXAM: CT ANGIOGRAPHY CHEST WITH CONTRAST TECHNIQUE: Multidetector CT imaging of the chest was performed using the standard protocol during bolus administration of intravenous contrast. Multiplanar CT image reconstructions and MIPs were obtained to evaluate the vascular anatomy. CONTRAST:  100 mL of Isovue  370 IV contrast COMPARISON:  Chest radiograph performed earlier today at 8:24 p.m., and CT of the chest performed 04/27/2016 FINDINGS: Cardiovascular:  There is no evidence of pulmonary embolus. The heart is unremarkable in appearance. The thoracic aorta remains normal in caliber. No significant calcific atherosclerotic disease is seen. The great vessels are grossly unremarkable. Mediastinum/Nodes: Diffuse wall thickening is noted along the esophagus, concerning for esophagitis. No mediastinal lymphadenopathy is seen. No pericardial effusion is identified. The thyroid gland is unremarkable. No axillary lymphadenopathy is appreciated. Lungs/Pleura: Note is made of near complete opacification of the bronchioles to both lower lung lobes, suspicious for aspiration. Minimal underlying bibasilar opacities are  noted. Underlying bilateral emphysematous change is seen, particularly at the lung apices. No pleural effusion or pneumothorax is identified. The patient's lung cancer is slightly less well characterized than on the prior study, with wall thickening along the posterior medial aspect of the right hemithorax, and associated underlying osseous metastatic changes. Upper Abdomen: The visualized portions of the liver and spleen are grossly unremarkable. The visualized portions of the pancreas, adrenal glands and kidneys are within normal limits. Musculoskeletal: Metastatic disease is again noted at vertebral body T5, with right-sided sclerosis. The visualized musculature is unremarkable in appearance. Review of the MIP images confirms the above findings. IMPRESSION: 1. No evidence of pulmonary embolus. 2. Near complete opacification of the bronchioles to both lower lung lobes, suspicious for significant aspiration. Minimal underlying bibasilar airspace opacities noted. 3. Underlying bilateral emphysematous change, particularly at the lung apices. 4. Diffuse wall thickening along the esophagus, concerning for esophagitis.  5. Right-sided lung cancer is slightly less well characterized than on the prior study, with diffuse wall thickening again noted along the posteromedial aspect of the right hemithorax. Underlying osseous metastatic changes again noted, most prominent at T5. Electronically Signed   By: Garald Balding M.D.   On: 06/26/2016 00:27   Dg Swallowing Func-speech Pathology  Result Date: 06/26/2016 Objective Swallowing Evaluation: Type of Study: MBS-Modified Barium Swallow Study Patient Details Name: RICKI CLACK MRN: [id number] Date of Birth: 1956-01-11 Today's Date: 06/26/2016 Time: SLP Start Time (ACUTE ONLY): 1420-SLP Stop Time (ACUTE ONLY): 1440 SLP Time Calculation (min) (ACUTE ONLY): 20 min Past Medical History: Past Medical History: Diagnosis Date . Bone cancer ([place])  . COPD (chronic obstructive pulmonary disease) ([place])  . Diverticulitis  . Essential hypertension  . GERD (gastroesophageal reflux disease)  . Hiatal hernia  . Hyperplastic rectal polyp  . Lung cancer ([place])  . PBC (primary biliary cirrhosis)   Diagnosed 2006 . S/P colonoscopy 2009  Pancolonic diverticula, hyperplastic rectal polyps Past Surgical History: Past Surgical History: Procedure Laterality Date . Arm Surgery Left   ulnar nerve release . CHOLECYSTECTOMY   . COLONOSCOPY  2009  Pancolonic diverticula, hyperplastic rectal polyps . COLONOSCOPY [name] 03/24/2015  RMR: Single colonic polyp removed as described above. Pancolonic diverticulosis. Abnormal sigmoid colon consistent with stigmata of recent diverticulitis.  Marland Kitchen ESOPHAGOGASTRODUODENOSCOPY [name] 06/28/2015  RMR: mild erosive reflux/small HH . INGUINAL HERNIA REPAIR Right Feb 2012  Dr. Arnoldo Morale  . INGUINAL HERNIA REPAIR Left 11/12/2015  Procedure: HERNIA REPAIR INGUINAL ADULT WITH MESH;  Surgeon: Aviva Signs, MD;  Location: [facility];  Service: General;  Laterality: Left; . INSERTION OF MESH Left 11/12/2015  Procedure: INSERTION OF MESH;  Surgeon: Aviva Signs, MD;  Location: [facility];  Service: General;   Laterality: Left; . LIVER BIOPSY   . NASAL SEPTUM SURGERY   HPI: HADDEN STEIG a 60 y.o.malewith medical history significant of stage 4 adenocarcinoma of lung, COPD. Pt presents to the ED with sudden onset of severe SOB. CXR shows bilateral aspiration. Patient with c/o difficulty swallowing and odynophagia beginning 10/5. S/p radiation treatment 03/2016.  No Data Recorded Assessment / Plan / Recommendation CHL IP CLINICAL IMPRESSIONS 06/26/2016 Therapy Diagnosis Mild pharyngeal phase dysphagia;Moderate pharyngeal phase dysphagia Clinical Impression Patient presents with a moderate pharyngeal phase dysphagia characterized by retention of barium in vallecula (moderate) and above CP (mild ) post swallow as well as mildly decreased laryngeal closure resulting in trace penetration during the swallow with thin liquids. Use of chin tuck assists to fully clear the pharynx of bolus as well  as prevent penetration/aspiration. Unclear cause of deficits at this time as hyolaryngeal elevation and pharyngeal contraction all appear WFL and oral motor exam is unrevealing. Question presence of pharyngo-esophageal candida as patient with c/o painful swallow. Note MD is initiating meds for possible candida. Will f/u at bedside for improvement and need for further testing depending on progress/changes in function.  Impact on safety and function Moderate aspiration risk;Mild aspiration risk   CHL IP TREATMENT RECOMMENDATION 06/26/2016 Treatment Recommendations Therapy as outlined in treatment plan below   Prognosis 06/26/2016 Prognosis for Safe Diet Advancement Fair Barriers to Reach Goals -- Barriers/Prognosis Comment -- CHL IP DIET RECOMMENDATION 06/26/2016 SLP Diet Recommendations Regular solids;Thin liquid Liquid Administration via Cup;Straw Medication Administration Whole meds with liquid Compensations Slow rate;Small sips/bites;Chin tuck Postural Changes Seated upright at 90 degrees   CHL IP OTHER RECOMMENDATIONS 06/26/2016  Recommended Consults -- Oral Care Recommendations Oral care BID Other Recommendations --   CHL IP FOLLOW UP RECOMMENDATIONS 06/26/2016 Follow up Recommendations Other (comment)   CHL IP FREQUENCY AND DURATION 06/26/2016 Speech Therapy Frequency (ACUTE ONLY) min 2x/week Treatment Duration 2 weeks      CHL IP ORAL PHASE 06/26/2016 Oral Phase WFL Oral - Pudding Teaspoon -- Oral - Pudding Cup -- Oral - Honey Teaspoon -- Oral - Honey Cup -- Oral - Nectar Teaspoon -- Oral - Nectar Cup -- Oral - Nectar Straw -- Oral - Thin Teaspoon -- Oral - Thin Cup -- Oral - Thin Straw -- Oral - Puree -- Oral - Mech Soft -- Oral - Regular -- Oral - Multi-Consistency -- Oral - Pill -- Oral Phase - Comment --  CHL IP PHARYNGEAL PHASE 06/26/2016 Pharyngeal Phase Impaired Pharyngeal- Pudding Teaspoon -- Pharyngeal -- Pharyngeal- Pudding Cup -- Pharyngeal -- Pharyngeal- Honey Teaspoon -- Pharyngeal -- Pharyngeal- Honey Cup -- Pharyngeal -- Pharyngeal- Nectar Teaspoon -- Pharyngeal -- Pharyngeal- Nectar Cup -- Pharyngeal -- Pharyngeal- Nectar Straw -- Pharyngeal -- Pharyngeal- Thin Teaspoon -- Pharyngeal -- Pharyngeal- Thin Cup Reduced tongue base retraction;Pharyngeal residue - valleculae;Pharyngeal residue - cp segment Pharyngeal -- Pharyngeal- Thin Straw Reduced tongue base retraction;Pharyngeal residue - valleculae;Pharyngeal residue - cp segment;Penetration/Aspiration during swallow Pharyngeal Material enters airway, remains ABOVE vocal cords then ejected out Pharyngeal- Puree Reduced tongue base retraction;Pharyngeal residue - valleculae Pharyngeal -- Pharyngeal- Mechanical Soft Reduced tongue base retraction;Pharyngeal residue - valleculae Pharyngeal -- Pharyngeal- Regular -- Pharyngeal -- Pharyngeal- Multi-consistency -- Pharyngeal -- Pharyngeal- Pill WFL Pharyngeal -- Pharyngeal Comment --  Gabriel Rainwater MA, CCC-SLP [phone] [place] Leah Meryl 06/26/2016, 3:15 PM               Microbiology: No results found for this or any previous  visit (from the past 240 hour(s)).   Labs: Basic Metabolic Panel:  Recent Labs Lab 06/30/16 0400 07/01/16 0457 07/03/16 0700 07/04/16 0345 07/06/16 0420  NA 138 138 137 140 137  K 3.8 4.8 4.0 4.6 4.3  CL 106 105 104 108 100*  CO2 '29 29 27 29 30  '$ GLUCOSE 120* 92 87 89 90  BUN '9 13 13 13 16  '$ CREATININE 0.66 0.76 0.77 0.84 1.09  CALCIUM 7.9* 8.3* 8.1* 8.3* 8.9  MG 1.6* 1.9 1.6*  --  2.0   Liver Function Tests: No results for input(s): AST, ALT, ALKPHOS, BILITOT, PROT, ALBUMIN in the last 168 hours. No results for input(s): LIPASE, AMYLASE in the last 168 hours. No results for input(s): AMMONIA in the last 168 hours. CBC:  Recent Labs Lab 07/02/16 0500 07/03/16 0700 07/04/16 0345 07/05/16  6295 07/05/16 2120 07/06/16 0420  WBC 0.6* 0.9* 1.4* 1.8* 2.6* 3.4*  NEUTROABS 0.1* 0.4* 0.7* 1.1*  --  2.3  HGB 7.9* 7.4* 7.3* 6.9* 9.5* 10.2*  HCT 23.8* 22.1* 22.1* 21.4* 28.3* 29.8*  MCV 85.6 84.4 88.0 88.4 85.5 87.4  PLT 9* 40* 33* 20* 26* 31*   Cardiac Enzymes: No results for input(s): CKTOTAL, CKMB, CKMBINDEX, TROPONINI in the last 168 hours. BNP: BNP (last 3 results)  Recent Labs  04/27/16 2020  BNP 67.0    ProBNP (last 3 results) No results for input(s): PROBNP in the last 8760 hours.  CBG: No results for input(s): GLUCAP in the last 168 hours.     SignedIrine Seal MD.  [facility] 07/06/2016, 4:30 PM

## 2016-07-06 NOTE — Progress Notes (Signed)
Agree with earlier shift assessment, no changes noted.  

## 2016-07-06 NOTE — Progress Notes (Signed)
SATURATION QUALIFICATIONS: (This note is used to comply with regulatory documentation for home oxygen)  Patient Saturations on Room Air at Rest = 95%  Patient Saturations on Room Air while Ambulating = 94%  Patient Saturations on  Please briefly explain why patient needs home oxygen:

## 2016-07-11 LAB — HIV-1 RNA ULTRAQUANT REFLEX TO GENTYP+
HIV-1 RNA BY PCR: 134000 copies/mL
HIV-1 RNA QUANT, LOG: 5.127 {Log_copies}/mL

## 2016-07-11 LAB — REFLEX TO GENOSURE(R) MG: HIV GenoSure(R) MG PDF: 0

## 2016-07-20 ENCOUNTER — Other Ambulatory Visit (HOSPITAL_COMMUNITY)
Admission: RE | Admit: 2016-07-20 | Discharge: 2016-07-20 | Disposition: A | Discharge status: Home / Self Care | Payer: 59 | Source: Ambulatory Visit | Attending: Internal Medicine | Admitting: Internal Medicine

## 2016-07-20 ENCOUNTER — Encounter: Payer: Self-pay | Admitting: Internal Medicine

## 2016-07-20 ENCOUNTER — Ambulatory Visit (INDEPENDENT_AMBULATORY_CARE_PROVIDER_SITE_OTHER): Payer: 59 | Admitting: Internal Medicine

## 2016-07-20 VITALS — BP 110/63 | HR 78 | Temp 98.1°F | Ht 73.0 in | Wt 135.5 lb

## 2016-07-20 DIAGNOSIS — M109 Gout, unspecified: Secondary | ICD-10-CM | POA: Insufficient documentation

## 2016-07-20 DIAGNOSIS — C3491 Malignant neoplasm of unspecified part of right bronchus or lung: Secondary | ICD-10-CM | POA: Diagnosis not present

## 2016-07-20 DIAGNOSIS — J690 Pneumonitis due to inhalation of food and vomit: Secondary | ICD-10-CM

## 2016-07-20 DIAGNOSIS — Z113 Encounter for screening for infections with a predominantly sexual mode of transmission: Secondary | ICD-10-CM | POA: Diagnosis present

## 2016-07-20 DIAGNOSIS — B20 Human immunodeficiency virus [HIV] disease: Secondary | ICD-10-CM | POA: Diagnosis not present

## 2016-07-20 LAB — CBC
HCT: 32.6 % — ABNORMAL LOW (ref 38.5–50.0)
HEMOGLOBIN: 10.7 g/dL — AB (ref 13.2–17.1)
MCH: 29.7 pg (ref 27.0–33.0)
MCHC: 32.8 g/dL (ref 32.0–36.0)
MCV: 90.6 fL (ref 80.0–100.0)
MPV: 8.6 fL (ref 7.5–12.5)
Platelets: 226 10*3/uL (ref 140–400)
RBC: 3.6 MIL/uL — AB (ref 4.20–5.80)
RDW: 19 % — ABNORMAL HIGH (ref 11.0–15.0)
WBC: 2.2 10*3/uL — ABNORMAL LOW (ref 3.8–10.8)

## 2016-07-20 NOTE — Assessment & Plan Note (Signed)
He has newly diagnosed HIV infection. His CD4 count is low but this may also be impacted by his recent chemotherapy. For obvious reasons, he and his wife are very upset and this has created tension between them. I told them that I cannot explain how he became infected or when he became infected but they are not happy with those answers. I told them that it really does not matter much how he was infected or when since that will not affect his management going forward. He is tolerating Descovy and Tivicay well and I will continue it pending results of his genotype. He needs to stay on pneumocystis prophylaxis. His wife is concerned that his test may be falsely positive. I told them that I did not believe that that was at all likely. I said that repeat testing could possibly show that his viral load is now undetectable. While they might interpret that as a false positive initial test, I would interpret it as a good response to antiretroviral treatment. They do not want to see a counselor now. She did undergo rapid testing here today and that was negative. He will follow-up with me in 3 weeks.

## 2016-07-20 NOTE — Assessment & Plan Note (Signed)
It appears that his pneumonia has resolved.

## 2016-07-20 NOTE — Progress Notes (Signed)
Patient Active Problem List   Diagnosis Date Noted  . HIV disease ([place])     Priority: High  . 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 Metastasis to bone ([place]) 04/01/2016    Priority: High  . Adenocarcinoma of right lung, stage 4 ([place]) 04/01/2016    Priority: High  . 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 Gout 07/20/2016  . Dysphagia, pharyngoesophageal phase   . Pressure injury of skin 07/02/2016  . Aspiration pneumonia of both lower lobes ([place])   . Normocytic anemia 05/27/2016  . Antineoplastic chemotherapy induced pancytopenia ([place]) 05/27/2016  . Depression with anxiety 05/27/2016  . GERD (gastroesophageal reflux disease) 05/27/2016  . Cellulitis 05/27/2016  . COPD (chronic obstructive pulmonary disease) ([place]) 04/28/2016  . Protein-calorie malnutrition, severe 04/28/2016  . Loss of weight 12/22/2015  . Hiatal hernia   . Esophagitis   . Diverticulosis of colon without hemorrhage   . History of colonic polyps   . Constipation 02/04/2015  . CHOLECYSTECTOMY, HX OF 01/20/2009    Patient's Medications  New Prescriptions   No medications on file  Previous Medications   ALBUTEROL (PROVENTIL HFA;VENTOLIN HFA) 108 (90 BASE) MCG/ACT INHALER    Inhale 2 puffs into the lungs every 4 (four) hours as needed for wheezing or shortness of breath.   BUDESONIDE-FORMOTEROL (SYMBICORT) 160-4.5 MCG/ACT INHALER    Inhale 2 puffs into the lungs 2 (two) times daily.   DOLUTEGRAVIR (TIVICAY) 50 MG TABLET    Take 1 tablet (50 mg total) by mouth daily.   DULOXETINE (CYMBALTA) 30 MG CAPSULE    Take 30 mg by mouth every morning.    EMTRICITABINE-TENOFOVIR AF (DESCOVY) 200-25 MG TABLET    Take 1 tablet by mouth daily.   FAMOTIDINE (PEPCID) 40 MG TABLET    Take 1 tablet (40 mg total) by mouth at bedtime.   FEEDING SUPPLEMENT (BOOST HIGH PROTEIN) LIQD    Take 1 Container by mouth 2 (two) times daily between meals.   FOLIC ACID (FOLVITE) 1 MG TABLET     Take 1 mg by mouth every morning.    FUROSEMIDE (LASIX) 20 MG TABLET    Take 20-40 tablets by mouth 2 (two) times daily. 40 mg in the morning and 20 mg at bedtime.   IBUPROFEN (ADVIL,MOTRIN) 200 MG TABLET    Take 200 mg by mouth every 6 (six) hours as needed for mild pain or moderate pain. Reported on 12/14/2015   MORPHINE (MSIR) 15 MG TABLET    Take 1 tablet (15 mg total) by mouth every 4 (four) hours as needed for severe pain.   MULTIPLE VITAMIN (MULTIVITAMIN WITH MINERALS) TABS TABLET    Take 1 tablet by mouth every morning.    NYSTATIN (MYCOSTATIN) 100000 UNIT/ML SUSPENSION    Take 5 mLs by mouth 4 (four) times daily.   OCALIVA 5 MG TABS    Take 1 tablet by mouth daily.   ONDANSETRON (ZOFRAN) 8 MG TABLET    Take 1 tablet (8 mg total) by mouth every 8 (eight) hours as needed for nausea or vomiting.   POLYETHYLENE GLYCOL (MIRALAX / GLYCOLAX) PACKET    Take 34 g by mouth 2 (two) times daily.    SUCRALFATE (CARAFATE) 1 G TABLET    TAKE 1 TABLET BY MOUTH 4 TIMES A DAY WITH MEALS AND AT BEDTIME   SULFAMETHOXAZOLE-TRIMETHOPRIM (BACTRIM DS,SEPTRA DS) 800-160 MG TABLET  Take 1 tablet by mouth 3 (three) times a week.   TIOTROPIUM (SPIRIVA) 18 MCG INHALATION CAPSULE    Place 18 mcg into inhaler and inhale every morning.   Modified Medications   No medications on file  Discontinued Medications   HYDROCORTISONE CREAM 1 %    Apply topically as needed for itching.   MORPHINE (MS CONTIN) 15 MG 12 HR TABLET    Take 1 tablet (15 mg total) by mouth every 12 (twelve) hours.   ONDANSETRON (ZOFRAN) 4 MG/2ML SOLN INJECTION    Inject 2 mLs (4 mg total) into the vein every 6 (six) hours as needed for nausea or vomiting.    Subjective: Jeffrey Gutierrez is in for his hospital follow-up visit. He is accompanied by his wife. He has a history of primary biliary cirrhosis and was recently diagnosed with metastatic adenocarcinoma involving lung and bone. He has undergone radiation therapy and has started chemotherapy at  [facility]. He was hospitalized recently with possible aspiration pneumonia. He was seen by my partner, Dr. Talbot Grumbling. His HIV test came back positive. His CD4 count was low at 40 and his HIV viral load was elevated at 134,000. His genotype is pending. Even before all results were back he was started on Descovy and Tivicay as well as prophylactic doses of trimethoprim sulfamethoxazole. He tells me that he does not recall talking to anyone about HIV while he was in the hospital other than being told that he was being put on some medications for HIV. He denies any known risk factors. His wife states that she found out after someone left a on his door stating that they needed to talk to him about a confidential matter. He then spoke with a DIS worker about his HIV.  His wife is very upset that she was not told it more when he was in the hospital. She asked me repeatedly how he cut the infection and is upset that I cannot tell them what the source is since he denies all risk factors. 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 Although he denies being stuck with needles he thinks he might of been stuck by a needle in the trash without knowing it. They are also somewhat upset, cannot tell them how long he has been infected. It does not appear he has been tested previously and they want to know why.  He is feeling better. He has not returned to [facility] to see his oncologist yet.  Review of Systems: Review of Systems  Constitutional: Positive for malaise/fatigue and weight loss. Negative for chills, diaphoresis and fever.  HENT: Negative for sore throat.   Respiratory: Positive for cough and shortness of breath. Negative for sputum production.   Cardiovascular: Negative for chest pain.  Gastrointestinal: Negative for abdominal pain, diarrhea, nausea and vomiting.  Genitourinary: Negative for dysuria and frequency.  Musculoskeletal: Negative for joint pain and myalgias.  Skin:  Negative for rash.  Neurological: Negative for dizziness and headaches.  Psychiatric/Behavioral: Positive for depression. Negative for substance abuse. The patient is not nervous/anxious.     Past Medical History:  Diagnosis Date  . Bone cancer ([place])   . COPD (chronic obstructive pulmonary disease) ([place])   . Diverticulitis   . Essential hypertension   . GERD (gastroesophageal reflux disease)   . Hiatal hernia   . Hyperplastic rectal polyp   . Lung cancer ([place])   . PBC (primary biliary cirrhosis)    Diagnosed  2006  . S/P colonoscopy 2009   Pancolonic diverticula, hyperplastic rectal polyps    Social History  Substance Use Topics  . Smoking status: Current Some Day Smoker    Packs/day: 0.50    Years: 43.00    Types: Cigarettes  . Smokeless tobacco: Never Used     Comment: hasn't smoked since entering the hosp  . Alcohol use 0.0 oz/week     Comment: occassional    Family History  Problem Relation Age of Onset  . Lung cancer Mother   . Pneumonia Father   . Lung cancer Father   . Pancreatitis Father     Allergies  Allergen Reactions  . Dihydrocodeine Anaphylaxis  . Hydrocodone Anaphylaxis and Other (See Comments)    Throat closes up  Patient states PCP gave 11/2015 and he has been tolerating it.  . Doxycycline Nausea And Vomiting  . Linzess [Linaclotide] Diarrhea    Objective:  Vitals:   07/20/16 1525  BP: 110/63  Pulse: 78  Temp: 98.1 F (36.7 C)  TempSrc: Oral  Weight: 135 lb 8 oz (61.5 kg)  Height: _0  (1.854 m)   Body mass index is 17.88 kg/m.  Physical Exam  Constitutional: He is oriented to person, place, and time.  Aaron Edelman answers questions with short answers. He has poor eye contact. His wife is upset and came increasingly agitated during the exam. She was tearful.  HENT:  Mouth/Throat: Oropharyngeal exudate present.  Cardiovascular: Normal rate and regular rhythm.   No murmur heard. Pulmonary/Chest: Effort normal and breath sounds normal.  He has no wheezes. He has no rales.  Distant breath sounds.  Abdominal: Soft. There is no tenderness.  Musculoskeletal: He exhibits no edema or tenderness.  Neurological: He is alert and oriented to person, place, and time.  Skin: No rash noted.  Scattered bruises on his arms.    Lab Results Lab Results  Component Value Date   WBC 3.4 (L) 07/06/2016   HGB 10.2 (L) 07/06/2016   HCT 29.8 (L) 07/06/2016   MCV 87.4 07/06/2016   PLT 31 (L) 07/06/2016    Lab Results  Component Value Date   CREATININE 1.09 07/06/2016   BUN 16 07/06/2016   NA 137 07/06/2016   K 4.3 07/06/2016   CL 100 (L) 07/06/2016   CO2 30 07/06/2016    Lab Results  Component Value Date   ALT 12 (L) 06/07/2016   AST 22 06/07/2016   ALKPHOS 108 06/07/2016   BILITOT 0.4 06/07/2016    Lab Results  Component Value Date   CHOL 146 07/31/2015   HDL 35 (L) 07/31/2015   LDLCALC 95 07/31/2015   TRIG 81 07/31/2015   CHOLHDL 4.2 07/31/2015   CD4 T Cell Abs (/uL)  Date Value  06/29/2016 40 (L)     Problem List Items Addressed This Visit      High   Adenocarcinoma of right lung, stage 4 ([place])    He will need to continue treatment for his adenocarcinoma. I told them that it is actually somewhat good news that his HIV was discovered before he underwent further chemotherapy as this probably would've led to severe, possibly life-threatening opportunistic infection. Now that he is on antiretroviral therapy and pneumocystis prophylaxis I feel it would be okay to continue chemotherapy.      HIV disease ([place])    He has newly diagnosed HIV infection. His CD4 count is low but this may also be impacted by his recent chemotherapy. For obvious reasons, he  and his wife are very upset and this has created tension between them. I told them that I cannot explain how he became infected or when he became infected but they are not happy with those answers. I told them that it really does not matter much how he was infected or when  since that will not affect his management going forward. He is tolerating Descovy and Tivicay well and I will continue it pending results of his genotype. He needs to stay on pneumocystis prophylaxis. His wife is concerned that his test may be falsely positive. I told them that I did not believe that that was at all likely. I said that repeat testing could possibly show that his viral load is now undetectable. While they might interpret that as a false positive initial test, I would interpret it as a good response to antiretroviral treatment. They do not want to see a counselor now. She did undergo rapid testing here today and that was negative. He will follow-up with me in 3 weeks.      Relevant Medications   nystatin (MYCOSTATIN) 100000 UNIT/ML suspension   Other Relevant Orders   T-helper cell (CD4)- ([facility] clinic only)   HIV 1 RNA quant-no reflex-bld   CBC   Comprehensive metabolic panel   HLA [id number]   RPR   Urine cytology ancillary only   Quantiferon tb gold assay (blood)     Unprioritized   Aspiration pneumonia of both lower lobes ([place]) - Primary    It appears that his pneumonia has resolved.       Other Visit Diagnoses   None.       Michel Bickers, MD [facility] [place] [facility] [phone] pager   [phone] cell 07/20/2016, 6:10 PM

## 2016-07-20 NOTE — Assessment & Plan Note (Signed)
He will need to continue treatment for his adenocarcinoma. I told them that it is actually somewhat good news that his HIV was discovered before he underwent further chemotherapy as this probably would've led to severe, possibly life-threatening opportunistic infection. Now that he is on antiretroviral therapy and pneumocystis prophylaxis I feel it would be okay to continue chemotherapy.

## 2016-07-21 LAB — COMPREHENSIVE METABOLIC PANEL
ALBUMIN: 3.2 g/dL — AB (ref 3.6–5.1)
ALT: 11 U/L (ref 9–46)
AST: 20 U/L (ref 10–35)
Alkaline Phosphatase: 99 U/L (ref 40–115)
BILIRUBIN TOTAL: 0.3 mg/dL (ref 0.2–1.2)
BUN: 12 mg/dL (ref 7–25)
CHLORIDE: 98 mmol/L (ref 98–110)
CO2: 29 mmol/L (ref 20–31)
CREATININE: 1.05 mg/dL (ref 0.70–1.33)
Calcium: 8.8 mg/dL (ref 8.6–10.3)
Glucose, Bld: 98 mg/dL (ref 65–99)
Potassium: 4.2 mmol/L (ref 3.5–5.3)
SODIUM: 135 mmol/L (ref 135–146)
TOTAL PROTEIN: 7.1 g/dL (ref 6.1–8.1)

## 2016-07-21 LAB — T-HELPER CELL (CD4) - (RCID CLINIC ONLY)
CD4 T CELL ABS: 100 /uL — AB (ref 400–2700)
CD4 T CELL HELPER: 14 % — AB (ref 33–55)

## 2016-07-21 LAB — RPR

## 2016-07-24 LAB — URINE CYTOLOGY ANCILLARY ONLY
Chlamydia: NEGATIVE
Neisseria Gonorrhea: NEGATIVE

## 2016-07-24 LAB — QUANTIFERON TB GOLD ASSAY (BLOOD)
Interferon Gamma Release Assay: NEGATIVE
Mitogen-Nil: 7.59 IU/mL
Quantiferon Nil Value: 0.04 IU/mL
Quantiferon Tb Ag Minus Nil Value: <0 IU/mL

## 2016-07-25 LAB — HIV-1 RNA QUANT-NO REFLEX-BLD
HIV 1 RNA Quant: 95 copies/mL — ABNORMAL HIGH (ref <20)
HIV-1 RNA QUANT, LOG: 1.98 {Log_copies}/mL — AB (ref <1.30)

## 2016-07-28 LAB — HLA B*5701: HLA-B 5701 W/RFLX HLA-B HIGH: NEGATIVE

## 2016-08-08 ENCOUNTER — Ambulatory Visit: Payer: 59 | Admitting: Internal Medicine

## 2016-08-09 ENCOUNTER — Ambulatory Visit (INDEPENDENT_AMBULATORY_CARE_PROVIDER_SITE_OTHER): Payer: 59 | Admitting: Internal Medicine

## 2016-08-09 ENCOUNTER — Encounter: Payer: Self-pay | Admitting: Internal Medicine

## 2016-08-09 DIAGNOSIS — C3491 Malignant neoplasm of unspecified part of right bronchus or lung: Secondary | ICD-10-CM

## 2016-08-09 DIAGNOSIS — B20 Human immunodeficiency virus [HIV] disease: Secondary | ICD-10-CM

## 2016-08-09 DIAGNOSIS — F418 Other specified anxiety disorders: Secondary | ICD-10-CM | POA: Diagnosis not present

## 2016-08-09 NOTE — Assessment & Plan Note (Signed)
I suspect that his right upper quadrant pain is due to his cancer. I am okay with him being back on cancer therapy now that his HIV is under better control.

## 2016-08-09 NOTE — Assessment & Plan Note (Signed)
His infection has come under much better control since starting antiretroviral therapy. His viral load is down from 134,000 to 95 and he starting to have CD4 reconstitution from 40 to 100. He will continue Descovy, Tivicay and trimethoprim sulfamethoxazole. He will follow-up after lab work in 3 months. I discussed issues of transmissibility with both of them. His wife is surprised that she is not infected but I told her that this is not terribly unusual. The fact that he is responding well to antiretroviral therapy will greatly reduce the risk that she could ever become infected. I did talk to them about condom use and they will need to make a decision about that. I recommended that she be tested here for free every 3 months. I also offered her the option of PrEP with Truvada. She will consider that.

## 2016-08-09 NOTE — Assessment & Plan Note (Signed)
His mild situational depression is improving.

## 2016-08-09 NOTE — Progress Notes (Signed)
Patient Active Problem List   Diagnosis Date Noted  . HIV disease ([place])     Priority: High  . 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 Metastasis to bone ([place]) 04/01/2016    Priority: High  . Adenocarcinoma of right lung, stage 4 ([place]) 04/01/2016    Priority: High  . 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 Gout 07/20/2016  . Dysphagia, pharyngoesophageal phase   . Pressure injury of skin 07/02/2016  . Aspiration pneumonia of both lower lobes ([place])   . Normocytic anemia 05/27/2016  . Antineoplastic chemotherapy induced pancytopenia ([place]) 05/27/2016  . Depression with anxiety 05/27/2016  . GERD (gastroesophageal reflux disease) 05/27/2016  . Cellulitis 05/27/2016  . COPD (chronic obstructive pulmonary disease) ([place]) 04/28/2016  . Protein-calorie malnutrition, severe 04/28/2016  . Loss of weight 12/22/2015  . Hiatal hernia   . Esophagitis   . Diverticulosis of colon without hemorrhage   . History of colonic polyps   . Constipation 02/04/2015  . CHOLECYSTECTOMY, HX OF 01/20/2009    Patient's Medications  New Prescriptions   No medications on file  Previous Medications   ALBUTEROL (PROVENTIL HFA;VENTOLIN HFA) 108 (90 BASE) MCG/ACT INHALER    Inhale 2 puffs into the lungs every 4 (four) hours as needed for wheezing or shortness of breath.   BUDESONIDE-FORMOTEROL (SYMBICORT) 160-4.5 MCG/ACT INHALER    Inhale 2 puffs into the lungs 2 (two) times daily.   DOLUTEGRAVIR (TIVICAY) 50 MG TABLET    Take 1 tablet (50 mg total) by mouth daily.   DULOXETINE (CYMBALTA) 30 MG CAPSULE    Take 30 mg by mouth every morning.    EMTRICITABINE-TENOFOVIR AF (DESCOVY) 200-25 MG TABLET    Take 1 tablet by mouth daily.   FAMOTIDINE (PEPCID) 40 MG TABLET    Take 1 tablet (40 mg total) by mouth at bedtime.   FEEDING SUPPLEMENT (BOOST HIGH PROTEIN) LIQD    Take 1 Container by mouth 2 (two) times daily between meals.   FOLIC ACID (FOLVITE) 1 MG TABLET     Take 1 mg by mouth every morning.    FUROSEMIDE (LASIX) 20 MG TABLET    Take 20-40 tablets by mouth 2 (two) times daily. 40 mg in the morning and 20 mg at bedtime.   IBUPROFEN (ADVIL,MOTRIN) 200 MG TABLET    Take 200 mg by mouth every 6 (six) hours as needed for mild pain or moderate pain. Reported on 12/14/2015   MORPHINE (MSIR) 15 MG TABLET    Take 1 tablet (15 mg total) by mouth every 4 (four) hours as needed for severe pain.   MULTIPLE VITAMIN (MULTIVITAMIN WITH MINERALS) TABS TABLET    Take 1 tablet by mouth every morning.    NYSTATIN (MYCOSTATIN) 100000 UNIT/ML SUSPENSION    Take 5 mLs by mouth 4 (four) times daily.   OCALIVA 5 MG TABS    Take 1 tablet by mouth daily.   ONDANSETRON (ZOFRAN) 8 MG TABLET    Take 1 tablet (8 mg total) by mouth every 8 (eight) hours as needed for nausea or vomiting.   POLYETHYLENE GLYCOL (MIRALAX / GLYCOLAX) PACKET    Take 34 g by mouth 2 (two) times daily.    SUCRALFATE (CARAFATE) 1 G TABLET    TAKE 1 TABLET BY MOUTH 4 TIMES A DAY WITH MEALS AND AT BEDTIME   SULFAMETHOXAZOLE-TRIMETHOPRIM (BACTRIM DS,SEPTRA DS) 800-160 MG TABLET  Take 1 tablet by mouth 3 (three) times a week.   TIOTROPIUM (SPIRIVA) 18 MCG INHALATION CAPSULE    Place 18 mcg into inhaler and inhale every morning.   Modified Medications   No medications on file  Discontinued Medications   No medications on file    Subjective: Jeffrey Gutierrez is in for his routine HIV follow-up visit. He is now been on Descovy and Tivicay for about 2 months. He has had no problems tolerating them and does not recall missing any doses. He also continues to take trimethoprim sulfamethoxazole 3 times weekly. He started on new immunotherapy for his metastatic adenocarcinoma several weeks ago. He continues to smoke cigarettes. He is feeling better. He has more energy. His appetite has improved and he is gaining weight. His wife, who is with him today, was tested here at the time of his first visit several weeks ago and is HIV  negative.  Review of Systems: Review of Systems  Constitutional: Positive for malaise/fatigue. Negative for chills, diaphoresis, fever and weight loss.  HENT: Negative for sore throat.   Respiratory: Negative for cough, sputum production and shortness of breath.   Cardiovascular: Negative for chest pain.  Gastrointestinal: Positive for abdominal pain. Negative for diarrhea, nausea and vomiting.       He continues to be bothered by right upper quadrant pain.  Genitourinary: Negative for dysuria and frequency.  Musculoskeletal: Negative for joint pain and myalgias.  Skin: Negative for rash.  Neurological: Negative for dizziness and headaches.  Psychiatric/Behavioral: Negative for depression and substance abuse. The patient is not nervous/anxious.        He denies depression or trouble sleeping but his wife says he still has his ups and downs. Overall they feel like his mood has improved.    Past Medical History:  Diagnosis Date  . Bone cancer ([place])   . COPD (chronic obstructive pulmonary disease) ([place])   . Diverticulitis   . Essential hypertension   . GERD (gastroesophageal reflux disease)   . Hiatal hernia   . Hyperplastic rectal polyp   . Lung cancer ([place])   . PBC (primary biliary cirrhosis)    Diagnosed 2006  . S/P colonoscopy 2009   Pancolonic diverticula, hyperplastic rectal polyps    Social History  Substance Use Topics  . Smoking status: Current Some Day Smoker    Packs/day: 0.25    Years: 43.00    Types: Cigarettes  . Smokeless tobacco: Never Used     Comment: hasn't smoked since entering the hosp  . Alcohol use 0.0 oz/week     Comment: occassional    Family History  Problem Relation Age of Onset  . Lung cancer Mother   . Pneumonia Father   . Lung cancer Father   . Pancreatitis Father     Allergies  Allergen Reactions  . Dihydrocodeine Anaphylaxis  . Hydrocodone Anaphylaxis and Other (See Comments)    Throat closes up  Patient states PCP gave 11/2015  and he has been tolerating it.  . Doxycycline Nausea And Vomiting  . Linzess [Linaclotide] Diarrhea    Objective:  Vitals:   08/09/16 1439  BP: 101/67  Pulse: 96  Temp: 98.5 F (36.9 C)  TempSrc: Oral  SpO2: 91%  Weight: 139 lb (63 kg)  Height: '6\' 1"'$  (1.854 m)   Body mass index is 18.34 kg/m.  Physical Exam  Constitutional: He is oriented to person, place, and time.  He looks like he feels better and he is in much better spirits.  His wife is in better spirits as well. He has gained 14 pounds since starting antiretroviral therapy.  HENT:  Mouth/Throat: No oropharyngeal exudate.  Eyes: Conjunctivae are normal.  Cardiovascular: Normal rate and regular rhythm.   No murmur heard. Pulmonary/Chest: Effort normal.  Distant breath sounds throughout all lung fields.  Abdominal: Soft. He exhibits no mass. There is tenderness.  Musculoskeletal: Normal range of motion.  Neurological: He is alert and oriented to person, place, and time.  Skin: No rash noted.  Scattered ecchymoses on arms.  Psychiatric: Mood and affect normal.    Lab Results Lab Results  Component Value Date   WBC 2.2 (L) 07/20/2016   HGB 10.7 (L) 07/20/2016   HCT 32.6 (L) 07/20/2016   MCV 90.6 07/20/2016   PLT 226 07/20/2016    Lab Results  Component Value Date   CREATININE 1.05 07/20/2016   BUN 12 07/20/2016   NA 135 07/20/2016   K 4.2 07/20/2016   CL 98 07/20/2016   CO2 29 07/20/2016    Lab Results  Component Value Date   ALT 11 07/20/2016   AST 20 07/20/2016   ALKPHOS 99 07/20/2016   BILITOT 0.3 07/20/2016    Lab Results  Component Value Date   CHOL 146 07/31/2015   HDL 35 (L) 07/31/2015   LDLCALC 95 07/31/2015   TRIG 81 07/31/2015   CHOLHDL 4.2 07/31/2015   HIV 1 RNA Quant (copies/mL)  Date Value  07/20/2016 95 (H)   CD4 T Cell Abs (/uL)  Date Value  07/20/2016 100 (L)  06/29/2016 40 (L)     Problem List Items Addressed This Visit      High   Adenocarcinoma of right lung,  stage 4 ([place])    I suspect that his right upper quadrant pain is due to his cancer. I am okay with him being back on cancer therapy now that his HIV is under better control.      HIV disease ([place])    His infection has come under much better control since starting antiretroviral therapy. His viral load is down from 134,000 to 95 and he starting to have CD4 reconstitution from 40 to 100. He will continue Descovy, Tivicay and trimethoprim sulfamethoxazole. He will follow-up after lab work in 3 months. I discussed issues of transmissibility with both of them. His wife is surprised that she is not infected but I told her that this is not terribly unusual. The fact that he is responding well to antiretroviral therapy will greatly reduce the risk that she could ever become infected. I did talk to them about condom use and they will need to make a decision about that. I recommended that she be tested here for free every 3 months. I also offered her the option of PrEP with Truvada. She will consider that.      Relevant Orders   T-helper cell (CD4)- ([facility] clinic only)   HIV 1 RNA quant-no reflex-bld   CBC   Comprehensive metabolic panel   Lipid panel   RPR     Unprioritized   Depression with anxiety    His mild situational depression is improving.           Michel Bickers, MD [facility] [place] [facility] [phone] pager   [phone] cell 08/09/2016, 3:08 PM

## 2016-08-22 ENCOUNTER — Encounter: Payer: Self-pay | Admitting: *Deleted

## 2016-08-25 ENCOUNTER — Other Ambulatory Visit: Payer: Self-pay | Admitting: Gastroenterology

## 2016-11-09 ENCOUNTER — Encounter: Payer: Self-pay | Admitting: Internal Medicine

## 2016-11-09 ENCOUNTER — Ambulatory Visit (INDEPENDENT_AMBULATORY_CARE_PROVIDER_SITE_OTHER): Payer: 59 | Admitting: Internal Medicine

## 2016-11-09 DIAGNOSIS — B20 Human immunodeficiency virus [HIV] disease: Secondary | ICD-10-CM

## 2016-11-09 LAB — COMPREHENSIVE METABOLIC PANEL
ALK PHOS: 118 U/L — AB (ref 40–115)
ALT: 7 U/L — AB (ref 9–46)
AST: 16 U/L (ref 10–35)
Albumin: 3.2 g/dL — ABNORMAL LOW (ref 3.6–5.1)
BILIRUBIN TOTAL: 0.5 mg/dL (ref 0.2–1.2)
BUN: 7 mg/dL (ref 7–25)
CO2: 26 mmol/L (ref 20–31)
Calcium: 8.6 mg/dL (ref 8.6–10.3)
Chloride: 102 mmol/L (ref 98–110)
Creat: 0.82 mg/dL (ref 0.70–1.33)
GLUCOSE: 109 mg/dL — AB (ref 65–99)
Potassium: 4.1 mmol/L (ref 3.5–5.3)
Sodium: 138 mmol/L (ref 135–146)
Total Protein: 6.7 g/dL (ref 6.1–8.1)

## 2016-11-09 LAB — CBC
HCT: 37.3 % — ABNORMAL LOW (ref 38.5–50.0)
Hemoglobin: 12.3 g/dL — ABNORMAL LOW (ref 13.2–17.1)
MCH: 30.7 pg (ref 27.0–33.0)
MCHC: 33 g/dL (ref 32.0–36.0)
MCV: 93 fL (ref 80.0–100.0)
MPV: 9 fL (ref 7.5–12.5)
PLATELETS: 104 10*3/uL — AB (ref 140–400)
RBC: 4.01 MIL/uL — ABNORMAL LOW (ref 4.20–5.80)
RDW: 15.1 % — AB (ref 11.0–15.0)
WBC: 2.5 10*3/uL — AB (ref 3.8–10.8)

## 2016-11-09 LAB — LIPID PANEL
Cholesterol: 135 mg/dL (ref <200)
HDL: 44 mg/dL (ref >40)
LDL CALC: 74 mg/dL (ref <100)
Total CHOL/HDL Ratio: 3.1 Ratio (ref <5.0)
Triglycerides: 87 mg/dL (ref <150)
VLDL: 17 mg/dL (ref <30)

## 2016-11-09 NOTE — Progress Notes (Signed)
HPI: PRIYANSH PRY is a 61 y.o. male who is here for his HIV follow up visit.   Allergies: Allergies  Allergen Reactions  . Dihydrocodeine Anaphylaxis  . Hydrocodone Anaphylaxis and Other (See Comments)    Throat closes up  Patient states PCP gave 11/2015 and he has been tolerating it.  . Doxycycline Nausea And Vomiting  . Linzess [Linaclotide] Diarrhea    Vitals: Temp: 98.3 F (36.8 C) (02/22 1138) Temp Source: Oral (02/22 1138) BP: 113/73 (02/22 1138) Pulse Rate: 105 (02/22 1138)  Past Medical History: Past Medical History:  Diagnosis Date  . Bone cancer ([place])   . COPD (chronic obstructive pulmonary disease) ([place])   . Diverticulitis   . Essential hypertension   . GERD (gastroesophageal reflux disease)   . Hiatal hernia   . Hyperplastic rectal polyp   . Lung cancer ([place])   . PBC (primary biliary cirrhosis)    Diagnosed 2006  . S/P colonoscopy 2009   Pancolonic diverticula, hyperplastic rectal polyps    Social History: Social History   Social History  . Marital status: Married    Spouse name: [name]  . Number of children: [name]  . Years of education: [name]   Social History Main Topics  . Smoking status: Current Some Day Smoker    Packs/day: 0.25    Years: 43.00    Types: Cigarettes  . Smokeless tobacco: Never Used     Comment: hasn't smoked since entering the hosp  . Alcohol use 0.0 oz/week     Comment: occassional  . Drug use: No  . Sexual activity: Yes   Other Topics Concern  . None   Social History Narrative  . None    Previous Regimen:   Current Regimen: DTG/Descovy  Labs: HIV 1 RNA Quant (copies/mL)  Date Value  07/20/2016 95 (H)   CD4 T Cell Abs (/uL)  Date Value  07/20/2016 100 (L)  06/29/2016 40 (L)   Hepatitis B Surface Ag (no units)  Date Value  12/21/2015 NEGATIVE   HCV Ab (no units)  Date Value  07/31/2015 NEGATIVE    CrCl: CrCl cannot be calculated (Patient's most recent lab result is older than the maximum 21 days  allowed.).  Lipids:    Component Value Date/Time   CHOL 146 07/31/2015 1014   TRIG 81 07/31/2015 1014   HDL 35 (L) 07/31/2015 1014   CHOLHDL 4.2 07/31/2015 1014   VLDL 16 07/31/2015 1014   LDLCALC 95 07/31/2015 1014    Assessment: Aaron Edelman is doing very well with his HIV. His VL has dropped down to 95 on DTG/Descovy. He has had no issues with the meds except the large copay starting this year. He is probably in the deductible hole. Activated both copay cards for him. He prefers to use the current [facility] because it's local to him. He doesn't want to deal with mail order.   Recommendations:  Both copay cards are activated  Onnie Boer, PharmD, BCPS, AAHIVP, CPP Clinical Infectious [facility] 11/09/2016, 12:15 PM

## 2016-11-09 NOTE — Progress Notes (Signed)
Patient Active Problem List   Diagnosis Date Noted  . HIV disease ([place])     Priority: High  . Spine metastasis ([place]) 04/01/2016    Priority: High  . Metastasis to bone ([place]) 04/01/2016    Priority: High  . Adenocarcinoma of right lung, stage 4 ([place]) 04/01/2016    Priority: High  . 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 Gout 07/20/2016  . Dysphagia, pharyngoesophageal phase   . Pressure injury of skin 07/02/2016  . Aspiration pneumonia of both lower lobes ([place])   . Normocytic anemia 05/27/2016  . Antineoplastic chemotherapy induced pancytopenia ([place]) 05/27/2016  . Depression with anxiety 05/27/2016  . GERD (gastroesophageal reflux disease) 05/27/2016  . Cellulitis 05/27/2016  . COPD (chronic obstructive pulmonary disease) ([place]) 04/28/2016  . Protein-calorie malnutrition, severe 04/28/2016  . Loss of weight 12/22/2015  . Hiatal hernia   . Esophagitis   . Diverticulosis of colon without hemorrhage   . History of colonic polyps   . Constipation 02/04/2015  . CHOLECYSTECTOMY, HX OF 01/20/2009    Patient's Medications  New Prescriptions   No medications on file  Previous Medications   ALBUTEROL (PROVENTIL HFA;VENTOLIN HFA) 108 (90 BASE) MCG/ACT INHALER    Inhale 2 puffs into the lungs every 4 (four) hours as needed for wheezing or shortness of breath.   BUDESONIDE-FORMOTEROL (SYMBICORT) 160-4.5 MCG/ACT INHALER    Inhale 2 puffs into the lungs 2 (two) times daily.   DOLUTEGRAVIR (TIVICAY) 50 MG TABLET    Take 1 tablet (50 mg total) by mouth daily.   DULOXETINE (CYMBALTA) 30 MG CAPSULE    Take 30 mg by mouth every morning.    EMTRICITABINE-TENOFOVIR AF (DESCOVY) 200-25 MG TABLET    Take 1 tablet by mouth daily.   FAMOTIDINE (PEPCID) 40 MG TABLET    Take 1 tablet (40 mg total) by mouth at bedtime.   FEEDING SUPPLEMENT (BOOST HIGH PROTEIN) LIQD    Take 1 Container by mouth 2 (two) times daily between meals.   FOLIC ACID (FOLVITE) 1 MG TABLET     Take 1 mg by mouth every morning.    FUROSEMIDE (LASIX) 20 MG TABLET    Take 20-40 tablets by mouth 2 (two) times daily. 40 mg in the morning and 20 mg at bedtime.   IBUPROFEN (ADVIL,MOTRIN) 200 MG TABLET    Take 200 mg by mouth every 6 (six) hours as needed for mild pain or moderate pain. Reported on 12/14/2015   MORPHINE (MSIR) 15 MG TABLET    Take 1 tablet (15 mg total) by mouth every 4 (four) hours as needed for severe pain.   MULTIPLE VITAMIN (MULTIVITAMIN WITH MINERALS) TABS TABLET    Take 1 tablet by mouth every morning.    NYSTATIN (MYCOSTATIN) 100000 UNIT/ML SUSPENSION    Take 5 mLs by mouth 4 (four) times daily.   OCALIVA 5 MG TABS    Take 1 tablet by mouth daily.   OMEPRAZOLE (PRILOSEC) 20 MG CAPSULE    TAKE 1 CAPSULE (20 MG TOTAL) BY MOUTH DAILY.   ONDANSETRON (ZOFRAN) 8 MG TABLET    Take 1 tablet (8 mg total) by mouth every 8 (eight) hours as needed for nausea or vomiting.   POLYETHYLENE GLYCOL (MIRALAX / GLYCOLAX) PACKET    Take 34 g by mouth 2 (two) times daily.    SUCRALFATE (CARAFATE) 1 G TABLET    TAKE 1 TABLET BY MOUTH 4 TIMES  A DAY WITH MEALS AND AT BEDTIME   SULFAMETHOXAZOLE-TRIMETHOPRIM (BACTRIM DS,SEPTRA DS) 800-160 MG TABLET    Take 1 tablet by mouth 3 (three) times a week.   TIOTROPIUM (SPIRIVA) 18 MCG INHALATION CAPSULE    Place 18 mcg into inhaler and inhale every morning.   Modified Medications   No medications on file  Discontinued Medications   No medications on file    Subjective: Jeffrey Gutierrez is in for his routine HIV follow-up visit. He continues to take his Descovy and Tivicay without difficulty. He has no side effects and does not recall missing any doses. However he was confronted with a $1500 co-pay when he went to pick up his medications in January. Fortunately he was able to pay for them.   He is receiving immunotherapy with atezolizumab infusions every 3 weeks for his metastatic adenocarcinoma.  Review of Systems: Review of Systems  Constitutional:  Negative for chills, diaphoresis, fever and weight loss.  Gastrointestinal: Negative for abdominal pain, diarrhea, nausea and vomiting.    Past Medical History:  Diagnosis Date  . Bone cancer ([place])   . COPD (chronic obstructive pulmonary disease) ([place])   . Diverticulitis   . Essential hypertension   . GERD (gastroesophageal reflux disease)   . Hiatal hernia   . Hyperplastic rectal polyp   . Lung cancer ([place])   . PBC (primary biliary cirrhosis)    Diagnosed 2006  . S/P colonoscopy 2009   Pancolonic diverticula, hyperplastic rectal polyps    Social History  Substance Use Topics  . Smoking status: Current Some Day Smoker    Packs/day: 0.25    Years: 43.00    Types: Cigarettes  . Smokeless tobacco: Never Used     Comment: hasn't smoked since entering the hosp  . Alcohol use 0.0 oz/week     Comment: occassional    Family History  Problem Relation Age of Onset  . Lung cancer Mother   . Pneumonia Father   . Lung cancer Father   . Pancreatitis Father     Allergies  Allergen Reactions  . Dihydrocodeine Anaphylaxis  . Hydrocodone Anaphylaxis and Other (See Comments)    Throat closes up  Patient states PCP gave 11/2015 and he has been tolerating it.  . Doxycycline Nausea And Vomiting  . Linzess [Linaclotide] Diarrhea    Objective:  Vitals:   11/09/16 1138  BP: 113/73  Pulse: (!) 105  Temp: 98.3 F (36.8 C)  TempSrc: Oral  Weight: 138 lb 8 oz (62.8 kg)   Body mass index is 18.27 kg/m.  Physical Exam  Constitutional: He is oriented to person, place, and time.  He is in no distress. His weight is unchanged.  HENT:  Mouth/Throat: No oropharyngeal exudate.  Cardiovascular: Normal rate and regular rhythm.   No murmur heard. Pulmonary/Chest: Effort normal and breath sounds normal.  Abdominal: Soft. There is no tenderness.  Neurological: He is alert and oriented to person, place, and time.  Psychiatric: Mood normal.  His affect is flat.    Lab Results Lab  Results  Component Value Date   WBC 2.2 (L) 07/20/2016   HGB 10.7 (L) 07/20/2016   HCT 32.6 (L) 07/20/2016   MCV 90.6 07/20/2016   PLT 226 07/20/2016    Lab Results  Component Value Date   CREATININE 1.05 07/20/2016   BUN 12 07/20/2016   NA 135 07/20/2016   K 4.2 07/20/2016   CL 98 07/20/2016   CO2 29 07/20/2016    Lab Results  Component  Value Date   ALT 11 07/20/2016   AST 20 07/20/2016   ALKPHOS 99 07/20/2016   BILITOT 0.3 07/20/2016    Lab Results  Component Value Date   CHOL 146 07/31/2015   HDL 35 (L) 07/31/2015   LDLCALC 95 07/31/2015   TRIG 81 07/31/2015   CHOLHDL 4.2 07/31/2015   HIV 1 RNA Quant (copies/mL)  Date Value  07/20/2016 95 (H)   CD4 T Cell Abs (/uL)  Date Value  07/20/2016 100 (L)  06/29/2016 40 (L)     Problem List Items Addressed This Visit      High   HIV disease ([place])    His infection is beginning to respond well to initiation of antiretroviral therapy with suppression of his viral load and some CD4 reconstitution. He is tolerating his therapy well. He will continue Descovy and Tivicay and get repeat lab work today. He and his wife have lots of questions about why he was tested when he was in the hospital with pneumonia and why he wasn't tested sooner by other doctors. I told them that I cannot speak for anyone other than myself. I told him that I am glad that he was tested and was able to start on therapy before beginning more chemotherapy. If he had not been on antiretroviral therapy his risk for serious, life-threatening opportunistic infection would have been very high. His wife will undergo repeat testing for HIV here today.      Relevant Orders   T-helper cell (CD4)- ([facility] clinic only)   HIV 1 RNA quant-no reflex-bld   CBC   Comprehensive metabolic panel   RPR   Lipid panel        Michel Bickers, MD [facility] for [place] [phone] pager   [phone] cell 11/09/2016, 2:39 PM

## 2016-11-09 NOTE — Assessment & Plan Note (Signed)
His infection is beginning to respond well to initiation of antiretroviral therapy with suppression of his viral load and some CD4 reconstitution. He is tolerating his therapy well. He will continue Descovy and Tivicay and get repeat lab work today. He and his wife have lots of questions about why he was tested when he was in the hospital with pneumonia and why he wasn't tested sooner by other doctors. I told them that I cannot speak for anyone other than myself. I told him that I am glad that he was tested and was able to start on therapy before beginning more chemotherapy. If he had not been on antiretroviral therapy his risk for serious, life-threatening opportunistic infection would have been very high. His wife will undergo repeat testing for HIV here today.

## 2016-11-10 LAB — RPR

## 2016-11-10 LAB — T-HELPER CELL (CD4) - (RCID CLINIC ONLY)
CD4 T CELL ABS: 64 /uL — AB (ref 400–2700)
CD4 T CELL HELPER: 8 % — AB (ref 33–55)

## 2016-11-13 LAB — HIV-1 RNA QUANT-NO REFLEX-BLD
HIV 1 RNA QUANT: DETECTED {copies}/mL — AB
HIV-1 RNA Quant, Log: <1.3 Log copies/mL — AB

## 2016-11-15 ENCOUNTER — Ambulatory Visit (INDEPENDENT_AMBULATORY_CARE_PROVIDER_SITE_OTHER): Payer: 59 | Admitting: Gastroenterology

## 2016-11-15 ENCOUNTER — Encounter: Payer: Self-pay | Admitting: Internal Medicine

## 2016-11-15 ENCOUNTER — Encounter: Payer: Self-pay | Admitting: Gastroenterology

## 2016-11-15 VITALS — BP 105/69 | HR 92 | Temp 97.8°F | Ht 73.0 in | Wt 136.2 lb

## 2016-11-15 DIAGNOSIS — K2100 Gastro-esophageal reflux disease with esophagitis, without bleeding: Secondary | ICD-10-CM

## 2016-11-15 DIAGNOSIS — K743 Primary biliary cirrhosis: Secondary | ICD-10-CM

## 2016-11-15 NOTE — Patient Instructions (Addendum)
Continue omeprazole each morning, continue Ocaliva daily.   We will see you in 6 months.   Please let me know if you would like to proceed with the ultrasound of your liver, but I also know you have a lot on your plate right now. Let us know if you need anything!

## 2016-11-15 NOTE — Assessment & Plan Note (Signed)
Continue omeprazole once daily. Esophageal thickening on CT recently. Vague dysphagia. Declining EGD. Known history of esophagitis. With his multiple co-morbidities, it is understandable this is not his primary focus. Unfortunately, he continues to lose weight and has a poor prognosis. Will see him in 6 months, health permitting.

## 2016-11-15 NOTE — Progress Notes (Signed)
Referring Provider: Sharilyn Sites, MD Primary Care Physician:  Purvis Kilts, MD  Chief Complaint  Patient presents with  . Abdominal Pain    1 1/2-2 weeks    HPI:   Jeffrey Gutierrez is a 61 y.o. male presenting today with a history of PBC, likely early cirrhosis, chronic RUQ pain, GERD, constipation. Thorough work-up both here and at [facility]. Has had mesenteric doppler May 2017 which was negative for ischemia.  Stage IV lung adenocarcinoma. [facility] manages pain medication. Omeprazole in the morning. Lately stool has been more "like a river". Comes and goes. Denies constipation. Not every single day. No N/V. No itching, confusion, jaundice. Appetite comes and goes. Boost/ensure once to twice a day. Wants to hold off on any imaging of liver. I note CT imaging stating diffuse wall thickening of esophagus, with known history of esophagitis. He does note occasional vague solid food dysphagia but also declining EGD for direct visualization/dilation. Will be losing his insurance in March as he states he will be terminated due to staying on long-term disability. Discussed [facility] [employer] with him; he is using COBRA.     Past Medical History:  Diagnosis Date  . Bone cancer ([place])   . COPD (chronic obstructive pulmonary disease) ([place])   . Diverticulitis   . Essential hypertension   . GERD (gastroesophageal reflux disease)   . Hiatal hernia   . Hyperplastic rectal polyp   . Lung cancer ([place])   . PBC (primary biliary cirrhosis)    Diagnosed 2006  . S/P colonoscopy 2009   Pancolonic diverticula, hyperplastic rectal polyps    Past Surgical History:  Procedure Laterality Date  . Arm Surgery Left    ulnar nerve release  . CHOLECYSTECTOMY    . COLONOSCOPY  2009   Pancolonic diverticula, hyperplastic rectal polyps  . COLONOSCOPY [name] 03/24/2015   RMR: Single colonic polyp removed as described above. Pancolonic diverticulosis. Abnormal sigmoid colon consistent with stigmata of  recent diverticulitis.   Marland Kitchen ESOPHAGOGASTRODUODENOSCOPY [name] 06/28/2015   RMR: mild erosive reflux/small HH  . INGUINAL HERNIA REPAIR Right Feb 2012   Dr. Arnoldo Morale   . INGUINAL HERNIA REPAIR Left 11/12/2015   Procedure: HERNIA REPAIR INGUINAL ADULT WITH MESH;  Surgeon: Aviva Signs, MD;  Location: [facility];  Service: General;  Laterality: Left;  . INSERTION OF MESH Left 11/12/2015   Procedure: INSERTION OF MESH;  Surgeon: Aviva Signs, MD;  Location: [facility];  Service: General;  Laterality: Left;  . LIVER BIOPSY    . NASAL SEPTUM SURGERY      Current Outpatient Prescriptions  Medication Sig Dispense Refill  . albuterol (PROVENTIL HFA;VENTOLIN HFA) 108 (90 Base) MCG/ACT inhaler Inhale 2 puffs into the lungs every 4 (four) hours as needed for wheezing or shortness of breath. 1 Inhaler 0  . budesonide-formoterol (SYMBICORT) 160-4.5 MCG/ACT inhaler Inhale 2 puffs into the lungs 2 (two) times daily.    . dolutegravir (TIVICAY) 50 MG tablet Take 1 tablet (50 mg total) by mouth daily. 30 tablet 3  . DULoxetine (CYMBALTA) 30 MG capsule Take 30 mg by mouth every morning.     Marland Kitchen emtricitabine-tenofovir AF (DESCOVY) 200-25 MG tablet Take 1 tablet by mouth daily. 30 tablet 3  . famotidine (PEPCID) 40 MG tablet Take 1 tablet (40 mg total) by mouth at bedtime.    . feeding supplement (BOOST HIGH PROTEIN) LIQD Take 1 Container by mouth 2 (two) times daily between meals.    . folic acid (FOLVITE) 1  MG tablet Take 1 mg by mouth every morning.     . furosemide (LASIX) 20 MG tablet Take 20 mg by mouth 2 (two) times daily as needed. 40 mg in the morning and 20 mg at bedtime.    Marland Kitchen ibuprofen (ADVIL,MOTRIN) 200 MG tablet Take 200 mg by mouth every 6 (six) hours as needed for mild pain or moderate pain. Reported on 12/14/2015    . morphine (MSIR) 15 MG tablet Take 1 tablet (15 mg total) by mouth every 4 (four) hours as needed for severe pain. 90 tablet 0  . Multiple Vitamin (MULTIVITAMIN WITH MINERALS) TABS tablet Take 1  tablet by mouth every morning.     . nystatin (MYCOSTATIN) 100000 UNIT/ML suspension Take 5 mLs by mouth as needed.     . OCALIVA 5 MG TABS Take 1 tablet by mouth daily. (Patient taking differently: Take 5 mg by mouth every morning. ) 90 tablet 3  . omeprazole (PRILOSEC) 20 MG capsule TAKE 1 CAPSULE (20 MG TOTAL) BY MOUTH DAILY. 30 capsule 5  . polyethylene glycol (MIRALAX / GLYCOLAX) packet Take 34 g by mouth 2 (two) times daily.     . sucralfate (CARAFATE) 1 g tablet TAKE 1 TABLET BY MOUTH 4 TIMES A DAY WITH MEALS AND AT BEDTIME 120 tablet 2  . tiotropium (SPIRIVA) 18 MCG inhalation capsule Place 18 mcg into inhaler and inhale every morning.     . ondansetron (ZOFRAN) 8 MG tablet Take 1 tablet (8 mg total) by mouth every 8 (eight) hours as needed for nausea or vomiting. (Patient not taking: Reported on 11/15/2016) 30 tablet 0  . sulfamethoxazole-trimethoprim (BACTRIM DS,SEPTRA DS) 800-160 MG tablet Take 1 tablet by mouth 3 (three) times a week. (Patient not taking: Reported on 11/15/2016) 12 tablet 3   No current facility-administered medications for this visit.     Allergies as of 11/15/2016 - Review Complete 11/15/2016  Allergen Reaction Noted  . Dihydrocodeine Anaphylaxis 09/17/2015  . Hydrocodone Anaphylaxis and Other (See Comments)   . Doxycycline Nausea And Vomiting 03/20/2016  . Linzess [linaclotide] Diarrhea 07/26/2015    Family History  Problem Relation Age of Onset  . Lung cancer Mother   . Pneumonia Father   . Lung cancer Father   . Pancreatitis Father     Social History   Social History  . Marital status: Married    Spouse name: [name]  . Number of children: [name]  . Years of education: [name]   Social History Main Topics  . Smoking status: Current Some Day Smoker    Packs/day: 0.25    Years: 43.00    Types: Cigarettes  . Smokeless tobacco: Never Used     Comment: hasn't smoked since entering the hosp  . Alcohol use 0.0 oz/week     Comment: occassional  . Drug use:  No  . Sexual activity: Yes   Other Topics Concern  . None   Social History Narrative  . None    Review of Systems: As mentioned in HPI   Physical Exam: BP 105/69   Pulse 92   Temp 97.8 F (36.6 C) (Oral)   Ht '6\' 1"'$  (1.854 m)   Wt 136 lb 3.2 oz (61.8 kg)   BMI 17.97 kg/m  General:   Alert and oriented. No distress noted. Thin, chronically-ill appearing.  Head:  Normocephalic and atraumatic. Eyes:  Conjuctiva clear without scleral icterus. Mouth:  Oral mucosa pink and moist. Good dentition. No lesions. Heart:  S1, S2 present without  murmurs, rubs, or gallops. Regular rate and rhythm. Abdomen:  +BS, soft, mild TTP RUQ and non-distended. No rebound or guarding. No HSM or masses noted. Msk:  Symmetrical without gross deformities. Normal posture. Extremities:  Without edema. Neurologic:  Alert and  oriented x4 Psych:  Alert and cooperative. Flat affect   Lab Results  Component Value Date   ALT 7 (L) 11/09/2016   AST 16 11/09/2016   ALKPHOS 118 (H) 11/09/2016   BILITOT 0.5 11/09/2016   Lab Results  Component Value Date   WBC 2.5 (L) 11/09/2016   HGB 12.3 (L) 11/09/2016   HCT 37.3 (L) 11/09/2016   MCV 93.0 11/09/2016   PLT 104 (L) 11/09/2016

## 2016-11-15 NOTE — Assessment & Plan Note (Signed)
Continue Ocaliva. Declining imaging currently. Multiple health issues currently. Will call if he decides to pursue [place]. Return in 6 months.

## 2016-11-16 NOTE — Progress Notes (Signed)
cc'ed to pcp °

## 2016-12-19 ENCOUNTER — Ambulatory Visit: Admission: RE | Admit: 2016-12-19 | Payer: 59 | Source: Ambulatory Visit | Admitting: Radiation Oncology

## 2016-12-19 ENCOUNTER — Ambulatory Visit: Payer: Self-pay

## 2016-12-20 NOTE — Progress Notes (Addendum)
Histology and Location of Primary Cancer: Adenocarcinoma lung  Sites of Visceral and Bony Metastatic Disease: Right rib cage,spine  Location(s) of Symptomatic Metastases: Right lower anterior rib cage,spine  Past/Anticipated chemotherapy by medical oncology, if any: Received chemotherapycarboplatin and pemetrexed  IV two cycles that made him feel very bad last August 2017 at [facility] it was discontinued.  Pain on a scale of 0-10 is: 1/10  Taking MS contin and MSIR  If Spine Met(s), symptoms, if any, include:No  Bowel/Bladder retention or incontinence (please describe):No  Numbness or weakness in extremities (please describe): No Current Decadron regimen, if applicable: No Ambulatory status? Walker? Wheelchair?: Ambulatory  SAFETY ISSUES:  Prior radiation? 30 GY in 10 fractions to T spine completed 04-21-16  Pacemaker/ICD? No  Possible current pregnancy? No  Is the patient on methotrexate? No  Current Complaints / other details:  61 year old man married.  12-11-16 Seen at [facility] by Dr. Selinda Orion  12-05-16 PET CT New right upper lobe 1 cm nodule (image 56, series 2). Two new left upper lobe nodules, larger one is 0.9 cm(image 4, series 2).  Three new bilateral upper lobe nodules concerning for metastatic disease. New trace right pleural effusion.  Wt Readings from Last 3 Encounters:  12/21/16 135 lb (61.2 kg)  11/15/16 136 lb 3.2 oz (61.8 kg)  11/09/16 138 lb 8 oz (62.8 kg)  BP 111/70   Pulse 92   Temp 98.6 F (37 C) (Oral)   Resp 18   Ht '6\' 1"'  (1.854 m)   Wt 135 lb (61.2 kg)   SpO2 94%   BMI 17.81 kg/m

## 2016-12-20 NOTE — Progress Notes (Addendum)
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 He was treated in July of 2017 to the thoracic spine with palliative radiotherapy. He relocated his care to [facility] for medical oncology and is currently receiving Atezo q 3 weeks with Dr. Mariel Kansky. His last PET imaging revealed stable disease in the chest including disease in the right lung, multiple chest wall lesions involving the ribs, particularly lytic changes which have enlarged since his last PET in July 2017 in the 7th anterolateral rib. The other lesions are in the posterior right upper chest wall, posterior right lower chest wall with rib involvement, anterior right chest wall, right posterior lateral chest wall, and right anterolateral chest wall. He doe snot have any abdominal or pelvic disease. Due to pain in the right 7th rib region and pain in the posterior right lower chest wall and in the anterior right chest wall along the base of the ribs, we are asked to see him to discuss options for palliative radiotherapy to these sites.   PREVIOUS RADIATION THERAPY: Yes  04/10/16-04/21/16: The T-spine was treated to 30 Gy in 10 fractions  PAST MEDICAL HISTORY:  Past Medical History:  Diagnosis Date  . Bone cancer ([place])   . COPD (chronic obstructive pulmonary disease) ([place])   . Diverticulitis   . Essential hypertension   . GERD (gastroesophageal reflux disease)   . Hiatal hernia   . Hyperplastic rectal polyp   . Lung cancer ([place])   . PBC (primary biliary cirrhosis)     Diagnosed 2006  . S/P colonoscopy 2009   Pancolonic diverticula, hyperplastic rectal polyps      PAST SURGICAL HISTORY: Past Surgical History:  Procedure Laterality Date  . Arm Surgery Left    ulnar nerve release  . CHOLECYSTECTOMY    . COLONOSCOPY  2009   Pancolonic diverticula, hyperplastic rectal polyps  . COLONOSCOPY [name] 03/24/2015   RMR: Single colonic polyp removed as described above. Pancolonic diverticulosis. Abnormal sigmoid colon consistent with stigmata of recent diverticulitis.   Marland Kitchen ESOPHAGOGASTRODUODENOSCOPY [name] 06/28/2015   RMR: mild erosive reflux/small HH  . INGUINAL HERNIA REPAIR Right Feb 2012   Dr. Arnoldo Morale   . INGUINAL HERNIA REPAIR Left 11/12/2015   Procedure: HERNIA REPAIR INGUINAL ADULT WITH MESH;  Surgeon: Aviva Signs, MD;  Location: [facility];  Service: General;  Laterality: Left;  . INSERTION OF MESH Left 11/12/2015   Procedure: INSERTION OF MESH;  Surgeon: Aviva Signs, MD;  Location: [facility];  Service: General;  Laterality: Left;  . LIVER BIOPSY    . NASAL SEPTUM SURGERY      FAMILY HISTORY:  Family History  Problem Relation Age of Onset  . Lung cancer Mother   . Pneumonia Father   . Lung cancer Father   . Pancreatitis Father     SOCIAL HISTORY:  Social History   Social History  . Marital status: Married    Spouse name: [name]  . Number of children: [name]  . Years of education: [name]  Occupational History  . Not on file.   Social History Main Topics  . Smoking status: Current Some Day Smoker    Packs/day: 0.25    Years: 43.00    Types: Cigarettes  . Smokeless tobacco: Never Used     Comment: hasn't smoked since entering the hosp  . Alcohol use 0.0 oz/week     Comment: occassional  . Drug use: No  . Sexual activity: Yes   Other Topics Concern  . Not on file   Social History Narrative  . No narrative on file    ALLERGIES: Dihydrocodeine; Hydrocodone; Doxycycline; and Linzess [linaclotide]  MEDICATIONS:  Current Outpatient Prescriptions   Medication Sig Dispense Refill  . albuterol (PROVENTIL HFA;VENTOLIN HFA) 108 (90 Base) MCG/ACT inhaler Inhale 2 puffs into the lungs every 4 (four) hours as needed for wheezing or shortness of breath. 1 Inhaler 0  . budesonide-formoterol (SYMBICORT) 160-4.5 MCG/ACT inhaler Inhale 2 puffs into the lungs 2 (two) times daily.    Marland Kitchen dexlansoprazole (DEXILANT) 60 MG capsule Take 60 mg by mouth daily.    . dolutegravir (TIVICAY) 50 MG tablet Take 1 tablet (50 mg total) by mouth daily. 30 tablet 3  . DULoxetine (CYMBALTA) 30 MG capsule Take 30 mg by mouth every morning.     Marland Kitchen emtricitabine-tenofovir AF (DESCOVY) 200-25 MG tablet Take 1 tablet by mouth daily. 30 tablet 3  . feeding supplement (BOOST HIGH PROTEIN) LIQD Take 1 Container by mouth 2 (two) times daily between meals.    . folic acid (FOLVITE) 1 MG tablet Take 1 mg by mouth every morning.     . furosemide (LASIX) 20 MG tablet Take 20 mg by mouth 2 (two) times daily as needed. 40 mg in the morning and 20 mg at bedtime.    Marland Kitchen ibuprofen (ADVIL,MOTRIN) 200 MG tablet Take 200 mg by mouth every 6 (six) hours as needed for mild pain or moderate pain. Reported on 12/14/2015    . morphine (MS CONTIN) 15 MG 12 hr tablet Take 15 mg by mouth every 12 (twelve) hours.    Marland Kitchen morphine (MSIR) 15 MG tablet Take 1 tablet (15 mg total) by mouth every 4 (four) hours as needed for severe pain. 90 tablet 0  . Multiple Vitamin (MULTIVITAMIN WITH MINERALS) TABS tablet Take 1 tablet by mouth every morning.     . nystatin (MYCOSTATIN) 100000 UNIT/ML suspension Take 5 mLs by mouth 4 (four) times daily.     . OCALIVA 5 MG TABS Take 1 tablet by mouth daily. (Patient taking differently: Take 5 mg by mouth every morning. ) 90 tablet 3  . omeprazole (PRILOSEC) 20 MG capsule TAKE 1 CAPSULE (20 MG TOTAL) BY MOUTH DAILY. 30 capsule 5  . polyethylene glycol (MIRALAX / GLYCOLAX) packet Take 34 g by mouth 2 (two) times daily.     . sucralfate (CARAFATE) 1 g tablet TAKE 1 TABLET BY MOUTH  4 TIMES A DAY WITH MEALS AND AT BEDTIME 120 tablet 2  . tiotropium (SPIRIVA) 18 MCG inhalation capsule Place 18 mcg into inhaler and inhale every morning.     . famotidine (PEPCID) 40 MG tablet Take 1 tablet (40 mg total) by mouth at bedtime. (Patient not taking: Reported on 12/21/2016)     No current facility-administered medications for this encounter.     REVIEW OF SYSTEMS:  On review of systems, the patient reports that he is doing ok with his pain and reports that the morphine ER and IR seems to help but  he visibly is breathing in a shallow manner.  He denies any sternal chest pain or tightness, and reports he's been able to keep his weight stable in the last few weeks. He reports sharp pain in the posterior chest along the lower rib about at the level of the 7th rib, and about 4 cm directly above this he describes sharp pain and a lump along the chest wall, and pain along the right lower chest wall. He describes a sharp pain as well  He denies any bowel or bladder disturbances, and denies abdominal pain, nausea or vomiting. He denies any other musculoskeletal or joint aches or pains, new skin lesions or concerns. A complete review of systems is obtained and is otherwise negative.     PHYSICAL EXAM:  height is '6\' 1"'$  (1.854 m) and weight is 135 lb (61.2 kg). His oral temperature is 98.6 F (37 C). His blood pressure is 111/70 and his pulse is 92. His respiration is 18 and oxygen saturation is 94%.   Pain Scale 7/10 In general this is a well appearing caucasian male in no acute distress. He is alert and oriented x4 and appropriate throughout the examination. HEENT reveals that the patient is normocephalic, atraumatic. EOMs are intact. PERRLA. Skin is intact without any evidence of gross lesions. Cardiovascular exam reveals a regular rate and rhythm, no clicks rubs or murmurs are auscultated. Chest is clear to auscultation bilaterally. He has pain with palpation of the posterior right thorax along the  7th rib and about 4 cm superior to this, there is another lesion that is painful but palpable about 3 cm in greatest dimension. He also has palpable pain along the right costophrenic angle anteriorly along the bony surface of the rib.  Lymphatic assessment is performed and does not reveal any adenopathy in the cervical, supraclavicular, axillary, or inguinal chains. Abdomen has active bowel sounds in all quadrants and is intact. The abdomen is soft, non tender, non distended. Lower extremities are negative for pretibial pitting edema, deep calf tenderness, cyanosis or clubbing.   KPS = 70  100 - Normal; no complaints; no evidence of disease. 90   - Able to carry on normal activity; minor signs or symptoms of disease. 80   - Normal activity with effort; some signs or symptoms of disease. 49   - Cares for self; unable to carry on normal activity or to do active work. 60   - Requires occasional assistance, but is able to care for most of his personal needs. 50   - Requires considerable assistance and frequent medical care. 66   - Disabled; requires special care and assistance. 71   - Severely disabled; hospital admission is indicated although death not imminent. 12   - Very sick; hospital admission necessary; active supportive treatment necessary. 10   - Moribund; fatal processes progressing rapidly. 0     - Dead  Karnofsky DA, Abelmann McLain, Craver LS and Burchenal [facility] [phone]) The use of the nitrogen mustards in the palliative treatment of carcinoma: with particular reference to bronchogenic carcinoma Cancer 1 634-56  LABORATORY DATA:  Lab Results  Component Value Date   WBC 2.5 (L) 11/09/2016   HGB 12.3 (L) 11/09/2016   HCT 37.3 (L) 11/09/2016   MCV 93.0 11/09/2016   PLT 104 (L) 11/09/2016   Lab Results  Component Value Date   NA 138 11/09/2016   K 4.1 11/09/2016   CL 102 11/09/2016   CO2 26 11/09/2016   Lab Results  Component Value Date   ALT 7 (L) 11/09/2016   AST 16 11/09/2016    ALKPHOS 118 (H) 11/09/2016   BILITOT 0.5 11/09/2016     RADIOGRAPHY: No results found.    IMPRESSION/PLAN: 8.  61 year-old gentleman with Stage IV, NSCLC, adenocarcinoma of the right lung with painful metastases to the ribs and chest wall. I spent time discussing his symptoms, and we reviewed the findings of his disease seen on his PET scan. He is most symptomatic along the right posterior chest at the 7th rib, as well as about 4 cm above this, and along the right anterior chest wall at the lowest ribs. Based on this, Dr. Tammi Klippel has already discussed that we could consider treating the ribs. He continues to use pain medication, though I think he would be able to achieve greater pain relief with radiotherapy. We discussed the risks, benefits, short, and long term effects of radiotherapy, and the patient is interested in proceeding. We discussed the delivery and logistics of radiotherapy. We would anticipate a course of 10 fractions, though discussed that Dr. Tammi Klippel would confirm this once his planning is done. He has signed written consent for treatment, and will proceed today with simulation.    Carola Rhine, PAC

## 2016-12-21 ENCOUNTER — Ambulatory Visit
Admission: RE | Admit: 2016-12-21 | Discharge: 2016-12-21 | Disposition: A | Discharge status: Home / Self Care | Payer: 59 | Source: Ambulatory Visit | Attending: Radiation Oncology | Admitting: Radiation Oncology

## 2016-12-21 ENCOUNTER — Encounter: Payer: Self-pay | Admitting: Radiation Oncology

## 2016-12-21 VITALS — BP 111/70 | HR 92 | Temp 98.6°F | Resp 18 | Ht 73.0 in | Wt 135.0 lb

## 2016-12-21 DIAGNOSIS — I10 Essential (primary) hypertension: Secondary | ICD-10-CM | POA: Diagnosis not present

## 2016-12-21 DIAGNOSIS — C7951 Secondary malignant neoplasm of bone: Secondary | ICD-10-CM

## 2016-12-21 DIAGNOSIS — F17210 Nicotine dependence, cigarettes, uncomplicated: Secondary | ICD-10-CM | POA: Insufficient documentation

## 2016-12-21 DIAGNOSIS — Z9889 Other specified postprocedural states: Secondary | ICD-10-CM | POA: Insufficient documentation

## 2016-12-21 DIAGNOSIS — J449 Chronic obstructive pulmonary disease, unspecified: Secondary | ICD-10-CM | POA: Diagnosis not present

## 2016-12-21 DIAGNOSIS — K219 Gastro-esophageal reflux disease without esophagitis: Secondary | ICD-10-CM | POA: Diagnosis not present

## 2016-12-21 DIAGNOSIS — C3491 Malignant neoplasm of unspecified part of right bronchus or lung: Secondary | ICD-10-CM | POA: Diagnosis not present

## 2016-12-27 DIAGNOSIS — C3491 Malignant neoplasm of unspecified part of right bronchus or lung: Secondary | ICD-10-CM | POA: Diagnosis not present

## 2016-12-28 ENCOUNTER — Ambulatory Visit
Admission: RE | Admit: 2016-12-28 | Discharge: 2016-12-28 | Disposition: A | Discharge status: Home / Self Care | Payer: 59 | Source: Ambulatory Visit | Attending: Radiation Oncology | Admitting: Radiation Oncology

## 2016-12-28 DIAGNOSIS — C3491 Malignant neoplasm of unspecified part of right bronchus or lung: Secondary | ICD-10-CM | POA: Diagnosis not present

## 2016-12-29 ENCOUNTER — Ambulatory Visit
Admission: RE | Admit: 2016-12-29 | Discharge: 2016-12-29 | Disposition: A | Discharge status: Home / Self Care | Payer: 59 | Source: Ambulatory Visit | Attending: Radiation Oncology | Admitting: Radiation Oncology

## 2016-12-29 DIAGNOSIS — C3491 Malignant neoplasm of unspecified part of right bronchus or lung: Secondary | ICD-10-CM | POA: Diagnosis not present

## 2017-01-01 ENCOUNTER — Ambulatory Visit
Admission: RE | Admit: 2017-01-01 | Discharge: 2017-01-01 | Disposition: A | Discharge status: Home / Self Care | Payer: 59 | Source: Ambulatory Visit | Attending: Radiation Oncology | Admitting: Radiation Oncology

## 2017-01-01 DIAGNOSIS — C3491 Malignant neoplasm of unspecified part of right bronchus or lung: Secondary | ICD-10-CM | POA: Diagnosis not present

## 2017-01-02 ENCOUNTER — Ambulatory Visit: Payer: 59

## 2017-01-03 ENCOUNTER — Ambulatory Visit
Admission: RE | Admit: 2017-01-03 | Discharge: 2017-01-03 | Disposition: A | Discharge status: Home / Self Care | Payer: 59 | Source: Ambulatory Visit | Attending: Radiation Oncology | Admitting: Radiation Oncology

## 2017-01-03 DIAGNOSIS — C3491 Malignant neoplasm of unspecified part of right bronchus or lung: Secondary | ICD-10-CM | POA: Diagnosis not present

## 2017-01-04 ENCOUNTER — Other Ambulatory Visit: Payer: Self-pay | Admitting: Urology

## 2017-01-04 ENCOUNTER — Ambulatory Visit
Admission: RE | Admit: 2017-01-04 | Discharge: 2017-01-04 | Disposition: A | Discharge status: Home / Self Care | Payer: 59 | Source: Ambulatory Visit | Attending: Radiation Oncology | Admitting: Radiation Oncology

## 2017-01-04 VITALS — BP 104/72 | HR 101 | Resp 18

## 2017-01-04 DIAGNOSIS — C3491 Malignant neoplasm of unspecified part of right bronchus or lung: Secondary | ICD-10-CM | POA: Diagnosis not present

## 2017-01-04 DIAGNOSIS — C7951 Secondary malignant neoplasm of bone: Secondary | ICD-10-CM

## 2017-01-04 MED ORDER — MORPHINE SULFATE 15 MG PO TABS: 15.0000 mg | ORAL_TABLET | ORAL | 0 refills | Status: DC | PRN

## 2017-01-04 MED ORDER — MORPHINE SULFATE ER 15 MG PO TBCR: 15.0000 mg | EXTENDED_RELEASE_TABLET | Freq: Two times a day (BID) | ORAL | 0 refills | Status: AC

## 2017-01-04 MED ORDER — MORPHINE SULFATE 4 MG/ML IJ SOLN
4.0000 mg | INTRAMUSCULAR | Status: AC
  Administered 2017-01-04: 4 mg via INTRAMUSCULAR
  Filled 2017-01-04: qty 1

## 2017-01-05 ENCOUNTER — Ambulatory Visit
Admission: RE | Admit: 2017-01-05 | Discharge: 2017-01-05 | Disposition: A | Discharge status: Home / Self Care | Payer: 59 | Source: Ambulatory Visit | Attending: Radiation Oncology | Admitting: Radiation Oncology

## 2017-01-05 DIAGNOSIS — C3491 Malignant neoplasm of unspecified part of right bronchus or lung: Secondary | ICD-10-CM | POA: Diagnosis not present

## 2017-01-08 ENCOUNTER — Ambulatory Visit
Admission: RE | Admit: 2017-01-08 | Discharge: 2017-01-08 | Disposition: A | Discharge status: Home / Self Care | Payer: 59 | Source: Ambulatory Visit | Attending: Radiation Oncology | Admitting: Radiation Oncology

## 2017-01-08 DIAGNOSIS — C3491 Malignant neoplasm of unspecified part of right bronchus or lung: Secondary | ICD-10-CM | POA: Diagnosis not present

## 2017-01-09 ENCOUNTER — Ambulatory Visit
Admission: RE | Admit: 2017-01-09 | Discharge: 2017-01-09 | Disposition: A | Discharge status: Home / Self Care | Payer: 59 | Source: Ambulatory Visit | Attending: Radiation Oncology | Admitting: Radiation Oncology

## 2017-01-09 DIAGNOSIS — C3491 Malignant neoplasm of unspecified part of right bronchus or lung: Secondary | ICD-10-CM | POA: Diagnosis not present

## 2017-01-10 ENCOUNTER — Ambulatory Visit
Admission: RE | Admit: 2017-01-10 | Discharge: 2017-01-10 | Disposition: A | Discharge status: Home / Self Care | Payer: 59 | Source: Ambulatory Visit | Attending: Radiation Oncology | Admitting: Radiation Oncology

## 2017-01-10 ENCOUNTER — Ambulatory Visit: Payer: 59

## 2017-01-10 DIAGNOSIS — C3491 Malignant neoplasm of unspecified part of right bronchus or lung: Secondary | ICD-10-CM | POA: Diagnosis not present

## 2017-01-11 ENCOUNTER — Ambulatory Visit
Admission: RE | Admit: 2017-01-11 | Discharge: 2017-01-11 | Disposition: A | Discharge status: Home / Self Care | Payer: 59 | Source: Ambulatory Visit | Attending: Radiation Oncology | Admitting: Radiation Oncology

## 2017-01-11 ENCOUNTER — Encounter: Payer: Self-pay | Admitting: Radiation Oncology

## 2017-01-11 DIAGNOSIS — C3491 Malignant neoplasm of unspecified part of right bronchus or lung: Secondary | ICD-10-CM | POA: Diagnosis not present

## 2017-01-12 NOTE — Progress Notes (Signed)
  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   Beams/energy:   Two Non-Opposing Fields // 10X, 6X  Narrative: The patient tolerated radiation treatment relatively well.   He reports that the pain in his right side has improved and is now a 3/10 on pain scale. He reports fatigue and denies any skin irritation.   Plan: The patient has completed radiation treatment. The patient will return to radiation oncology clinic for routine followup in one month. I advised him to call or return sooner if he has any questions or concerns related to his recovery or treatment. ________________________________  Sheral Apley. Tammi Klippel, M.D.   This document serves as a record of services personally performed by Tyler Pita, MD. It was created on his behalf by Arlyce Harman, a trained medical scribe. The creation of this record is based on the scribe's personal observations and the provider's statements to them. This document has been checked and approved by the attending provider.

## 2017-01-17 ENCOUNTER — Telehealth: Payer: Self-pay

## 2017-01-17 NOTE — Telephone Encounter (Signed)
Pt called- he has lost his insurance and is almost out of his [place]. He is asking for a coupon to get it cheaper. I have looked and there are no discount cards available and Acquanetta Belling is $6000+ for 30 day supply. I have printed patient assistance forms and forms for the "interconnect program". I called and advised the pt. He asked that I mail them to him. I cannot send them in anything without his signature.  In order to speed up this process, can we fill out our section before I mail it and the pt will be able to send them in as soon as he fills them out?

## 2017-01-17 NOTE — Telephone Encounter (Signed)
Yes, but I will not be here Thursday or Friday.

## 2017-01-18 NOTE — Telephone Encounter (Signed)
Paperwork done and mailed to the pt.

## 2017-01-19 ENCOUNTER — Telehealth: Payer: Self-pay | Admitting: Radiation Oncology

## 2017-01-19 ENCOUNTER — Other Ambulatory Visit: Payer: Self-pay | Admitting: Urology

## 2017-01-19 DIAGNOSIS — C3491 Malignant neoplasm of unspecified part of right bronchus or lung: Secondary | ICD-10-CM

## 2017-01-19 DIAGNOSIS — C7951 Secondary malignant neoplasm of bone: Secondary | ICD-10-CM

## 2017-01-19 MED ORDER — MORPHINE SULFATE 15 MG PO TABS: 15.0000 mg | ORAL_TABLET | ORAL | 0 refills | Status: AC | PRN

## 2017-01-19 NOTE — Progress Notes (Addendum)
Patient called for a refill of his pain medications.  I recently filled MS Contin and MS-IR on 01/04/17.  It is too early to refill the long-acting MS Contin but I will refill the MS-IR.  Further refills of his pain medications will need to be prescribed through his medical oncologist at [facility]- Dr. Mariel Kansky with consideration for a pain management referral if there is chronic need for narcotics.

## 2017-01-19 NOTE — Telephone Encounter (Signed)
Phoned patient making him aware his MSIR is ready for pick up in the radiation oncology nursing area until 1645 today. Explained he needed to bring his license with him and sign to pick up the script. Patient verbalized understanding.

## 2017-01-31 ENCOUNTER — Inpatient Hospital Stay (HOSPITAL_COMMUNITY): Payer: 59

## 2017-01-31 ENCOUNTER — Encounter (HOSPITAL_COMMUNITY): Payer: Self-pay | Admitting: Emergency Medicine

## 2017-01-31 ENCOUNTER — Emergency Department (HOSPITAL_COMMUNITY): Payer: 59

## 2017-01-31 ENCOUNTER — Inpatient Hospital Stay (HOSPITAL_COMMUNITY)
Admission: EM | Admit: 2017-01-31 | Discharge: 2017-02-02 | DRG: 190 | Disposition: A | Discharge status: Home / Self Care | Payer: 59 | Attending: Family Medicine | Admitting: Family Medicine

## 2017-01-31 DIAGNOSIS — R609 Edema, unspecified: Secondary | ICD-10-CM

## 2017-01-31 DIAGNOSIS — K5900 Constipation, unspecified: Secondary | ICD-10-CM | POA: Diagnosis present

## 2017-01-31 DIAGNOSIS — Z9981 Dependence on supplemental oxygen: Secondary | ICD-10-CM

## 2017-01-31 DIAGNOSIS — Z79899 Other long term (current) drug therapy: Secondary | ICD-10-CM

## 2017-01-31 DIAGNOSIS — J9621 Acute and chronic respiratory failure with hypoxia: Secondary | ICD-10-CM | POA: Diagnosis present

## 2017-01-31 DIAGNOSIS — G894 Chronic pain syndrome: Secondary | ICD-10-CM | POA: Diagnosis present

## 2017-01-31 DIAGNOSIS — R64 Cachexia: Secondary | ICD-10-CM | POA: Diagnosis present

## 2017-01-31 DIAGNOSIS — T380X5A Adverse effect of glucocorticoids and synthetic analogues, initial encounter: Secondary | ICD-10-CM | POA: Diagnosis present

## 2017-01-31 DIAGNOSIS — K210 Gastro-esophageal reflux disease with esophagitis: Secondary | ICD-10-CM | POA: Diagnosis present

## 2017-01-31 DIAGNOSIS — Z7951 Long term (current) use of inhaled steroids: Secondary | ICD-10-CM | POA: Diagnosis not present

## 2017-01-31 DIAGNOSIS — K5903 Drug induced constipation: Secondary | ICD-10-CM | POA: Diagnosis present

## 2017-01-31 DIAGNOSIS — R0600 Dyspnea, unspecified: Secondary | ICD-10-CM | POA: Diagnosis not present

## 2017-01-31 DIAGNOSIS — Z681 Body mass index (BMI) 19 or less, adult: Secondary | ICD-10-CM

## 2017-01-31 DIAGNOSIS — K209 Esophagitis, unspecified without bleeding: Secondary | ICD-10-CM | POA: Diagnosis present

## 2017-01-31 DIAGNOSIS — E8809 Other disorders of plasma-protein metabolism, not elsewhere classified: Secondary | ICD-10-CM | POA: Diagnosis present

## 2017-01-31 DIAGNOSIS — I358 Other nonrheumatic aortic valve disorders: Secondary | ICD-10-CM | POA: Diagnosis present

## 2017-01-31 DIAGNOSIS — Z888 Allergy status to other drugs, medicaments and biological substances status: Secondary | ICD-10-CM | POA: Diagnosis not present

## 2017-01-31 DIAGNOSIS — I471 Supraventricular tachycardia: Secondary | ICD-10-CM | POA: Diagnosis present

## 2017-01-31 DIAGNOSIS — K743 Primary biliary cirrhosis: Secondary | ICD-10-CM | POA: Diagnosis present

## 2017-01-31 DIAGNOSIS — B20 Human immunodeficiency virus [HIV] disease: Secondary | ICD-10-CM | POA: Diagnosis present

## 2017-01-31 DIAGNOSIS — I110 Hypertensive heart disease with heart failure: Secondary | ICD-10-CM | POA: Diagnosis present

## 2017-01-31 DIAGNOSIS — C7951 Secondary malignant neoplasm of bone: Secondary | ICD-10-CM | POA: Diagnosis present

## 2017-01-31 DIAGNOSIS — R0902 Hypoxemia: Secondary | ICD-10-CM | POA: Diagnosis not present

## 2017-01-31 DIAGNOSIS — T40605A Adverse effect of unspecified narcotics, initial encounter: Secondary | ICD-10-CM | POA: Diagnosis present

## 2017-01-31 DIAGNOSIS — C3491 Malignant neoplasm of unspecified part of right bronchus or lung: Secondary | ICD-10-CM

## 2017-01-31 DIAGNOSIS — C3490 Malignant neoplasm of unspecified part of unspecified bronchus or lung: Secondary | ICD-10-CM | POA: Diagnosis present

## 2017-01-31 DIAGNOSIS — Z885 Allergy status to narcotic agent status: Secondary | ICD-10-CM

## 2017-01-31 DIAGNOSIS — E43 Unspecified severe protein-calorie malnutrition: Secondary | ICD-10-CM | POA: Diagnosis present

## 2017-01-31 DIAGNOSIS — Z881 Allergy status to other antibiotic agents status: Secondary | ICD-10-CM

## 2017-01-31 DIAGNOSIS — I5033 Acute on chronic diastolic (congestive) heart failure: Secondary | ICD-10-CM | POA: Diagnosis not present

## 2017-01-31 DIAGNOSIS — J441 Chronic obstructive pulmonary disease with (acute) exacerbation: Secondary | ICD-10-CM | POA: Diagnosis present

## 2017-01-31 DIAGNOSIS — F17210 Nicotine dependence, cigarettes, uncomplicated: Secondary | ICD-10-CM | POA: Diagnosis present

## 2017-01-31 LAB — CBC WITH DIFFERENTIAL/PLATELET
BASOS ABS: 0 10*3/uL (ref 0.0–0.1)
Basophils Relative: 0 %
Eosinophils Absolute: 0 10*3/uL (ref 0.0–0.7)
Eosinophils Relative: 0 %
HCT: 33.5 % — ABNORMAL LOW (ref 39.0–52.0)
HEMOGLOBIN: 11.1 g/dL — AB (ref 13.0–17.0)
LYMPHS ABS: 0.5 10*3/uL — AB (ref 0.7–4.0)
LYMPHS PCT: 14 %
MCH: 31.7 pg (ref 26.0–34.0)
MCHC: 33.1 g/dL (ref 30.0–36.0)
MCV: 95.7 fL (ref 78.0–100.0)
Monocytes Absolute: 0.2 10*3/uL (ref 0.1–1.0)
Monocytes Relative: 6 %
NEUTROS ABS: 2.9 10*3/uL (ref 1.7–7.7)
NEUTROS PCT: 80 %
PLATELETS: 105 10*3/uL — AB (ref 150–400)
RBC: 3.5 MIL/uL — ABNORMAL LOW (ref 4.22–5.81)
RDW: 15.1 % (ref 11.5–15.5)
WBC: 3.6 10*3/uL — ABNORMAL LOW (ref 4.0–10.5)

## 2017-01-31 LAB — COMPREHENSIVE METABOLIC PANEL
ALT: 11 U/L — AB (ref 17–63)
AST: 22 U/L (ref 15–41)
Albumin: 2.7 g/dL — ABNORMAL LOW (ref 3.5–5.0)
Alkaline Phosphatase: 112 U/L (ref 38–126)
Anion gap: 6 (ref 5–15)
BILIRUBIN TOTAL: 0.7 mg/dL (ref 0.3–1.2)
BUN: 11 mg/dL (ref 6–20)
CHLORIDE: 98 mmol/L — AB (ref 101–111)
CO2: 31 mmol/L (ref 22–32)
CREATININE: 0.65 mg/dL (ref 0.61–1.24)
Calcium: 8.4 mg/dL — ABNORMAL LOW (ref 8.9–10.3)
GFR calc Af Amer: >60 mL/min (ref >60)
Glucose, Bld: 124 mg/dL — ABNORMAL HIGH (ref 65–99)
Potassium: 3.7 mmol/L (ref 3.5–5.1)
Sodium: 135 mmol/L (ref 135–145)
Total Protein: 6.6 g/dL (ref 6.5–8.1)

## 2017-01-31 LAB — CBC
HEMATOCRIT: 36.8 % — AB (ref 39.0–52.0)
HEMOGLOBIN: 11.8 g/dL — AB (ref 13.0–17.0)
MCH: 31.1 pg (ref 26.0–34.0)
MCHC: 32.1 g/dL (ref 30.0–36.0)
MCV: 96.8 fL (ref 78.0–100.0)
Platelets: 120 10*3/uL — ABNORMAL LOW (ref 150–400)
RBC: 3.8 MIL/uL — ABNORMAL LOW (ref 4.22–5.81)
RDW: 14.9 % (ref 11.5–15.5)
WBC: 2.9 10*3/uL — ABNORMAL LOW (ref 4.0–10.5)

## 2017-01-31 LAB — CREATININE, SERUM
Creatinine, Ser: 0.89 mg/dL (ref 0.61–1.24)
GFR calc non Af Amer: >60 mL/min (ref >60)

## 2017-01-31 LAB — ECHOCARDIOGRAM COMPLETE
HEIGHTINCHES: 73 in
Weight: 2080 oz

## 2017-01-31 LAB — BRAIN NATRIURETIC PEPTIDE: B Natriuretic Peptide: 124.2 pg/mL — ABNORMAL HIGH (ref 0.0–100.0)

## 2017-01-31 LAB — VITAMIN B12: Vitamin B-12: 758 pg/mL (ref 180–914)

## 2017-01-31 MED ORDER — LEVALBUTEROL HCL 0.63 MG/3ML IN NEBU
0.6300 mg | INHALATION_SOLUTION | Freq: Three times a day (TID) | RESPIRATORY_TRACT | Status: DC
  Administered 2017-01-31 – 2017-02-02 (×6): 0.63 mg via RESPIRATORY_TRACT
  Filled 2017-01-31 (×6): qty 3

## 2017-01-31 MED ORDER — METHYLPREDNISOLONE SODIUM SUCC 125 MG IJ SOLR
60.0000 mg | Freq: Three times a day (TID) | INTRAMUSCULAR | Status: DC
  Administered 2017-01-31 – 2017-02-01 (×3): 60 mg via INTRAVENOUS
  Filled 2017-01-31 (×3): qty 2

## 2017-01-31 MED ORDER — EMTRICITABINE-TENOFOVIR AF 200-25 MG PO TABS
1.0000 | ORAL_TABLET | Freq: Every day | ORAL | Status: DC
  Administered 2017-01-31 – 2017-02-01 (×2): 1 via ORAL
  Filled 2017-01-31 (×2): qty 1

## 2017-01-31 MED ORDER — DULOXETINE HCL 30 MG PO CPEP
30.0000 mg | ORAL_CAPSULE | Freq: Every day | ORAL | Status: DC
  Administered 2017-01-31 – 2017-02-02 (×3): 30 mg via ORAL
  Filled 2017-01-31 (×3): qty 1

## 2017-01-31 MED ORDER — IPRATROPIUM BROMIDE 0.02 % IN SOLN
0.5000 mg | Freq: Four times a day (QID) | RESPIRATORY_TRACT | Status: DC
  Administered 2017-01-31: 0.5 mg via RESPIRATORY_TRACT
  Filled 2017-01-31: qty 2.5

## 2017-01-31 MED ORDER — ACETAMINOPHEN 325 MG PO TABS: 650.0000 mg | ORAL_TABLET | Freq: Four times a day (QID) | ORAL | Status: DC | PRN

## 2017-01-31 MED ORDER — POLYETHYLENE GLYCOL 3350 17 G PO PACK
34.0000 g | PACK | Freq: Two times a day (BID) | ORAL | Status: DC
  Administered 2017-01-31 – 2017-02-02 (×4): 34 g via ORAL
  Filled 2017-01-31 (×5): qty 2

## 2017-01-31 MED ORDER — ADULT MULTIVITAMIN W/MINERALS CH
1.0000 | ORAL_TABLET | Freq: Every day | ORAL | Status: DC
  Administered 2017-01-31 – 2017-02-02 (×3): 1 via ORAL
  Filled 2017-01-31 (×3): qty 1

## 2017-01-31 MED ORDER — ALBUTEROL (5 MG/ML) CONTINUOUS INHALATION SOLN
10.0000 mg/h | INHALATION_SOLUTION | Freq: Once | RESPIRATORY_TRACT | Status: AC
  Administered 2017-01-31: 10 mg/h via RESPIRATORY_TRACT
  Filled 2017-01-31: qty 20

## 2017-01-31 MED ORDER — ONDANSETRON HCL 4 MG/2ML IJ SOLN: 4.0000 mg | Freq: Four times a day (QID) | INTRAMUSCULAR | Status: DC | PRN

## 2017-01-31 MED ORDER — MORPHINE SULFATE ER 15 MG PO TBCR
15.0000 mg | EXTENDED_RELEASE_TABLET | Freq: Two times a day (BID) | ORAL | Status: DC
Start: 2017-01-31 — End: 2017-02-02
  Administered 2017-01-31 – 2017-02-02 (×4): 15 mg via ORAL
  Filled 2017-01-31 (×5): qty 1

## 2017-01-31 MED ORDER — DOLUTEGRAVIR SODIUM 50 MG PO TABS
50.0000 mg | ORAL_TABLET | Freq: Every day | ORAL | Status: DC
  Administered 2017-01-31 – 2017-02-01 (×2): 50 mg via ORAL
  Filled 2017-01-31 (×2): qty 1

## 2017-01-31 MED ORDER — MELATONIN 3 MG PO TABS: 3.0000 mg | ORAL_TABLET | Freq: Every day | ORAL | Status: DC

## 2017-01-31 MED ORDER — ALBUTEROL (5 MG/ML) CONTINUOUS INHALATION SOLN
10.0000 mg/h | INHALATION_SOLUTION | Freq: Once | RESPIRATORY_TRACT | Status: AC
  Administered 2017-01-31: 10 mg/h via RESPIRATORY_TRACT

## 2017-01-31 MED ORDER — IPRATROPIUM BROMIDE 0.02 % IN SOLN
0.5000 mg | Freq: Once | RESPIRATORY_TRACT | Status: AC
  Administered 2017-01-31: 0.5 mg via RESPIRATORY_TRACT
  Filled 2017-01-31: qty 2.5

## 2017-01-31 MED ORDER — OBETICHOLIC ACID 5 MG PO TABS: 1.0000 | ORAL_TABLET | Freq: Every day | ORAL | Status: DC

## 2017-01-31 MED ORDER — ENOXAPARIN SODIUM 40 MG/0.4ML ~~LOC~~ SOLN
40.0000 mg | SUBCUTANEOUS | Status: DC
  Administered 2017-01-31 – 2017-02-01 (×2): 40 mg via SUBCUTANEOUS
  Filled 2017-01-31 (×2): qty 0.4

## 2017-01-31 MED ORDER — BISACODYL 5 MG PO TBEC: 5.0000 mg | DELAYED_RELEASE_TABLET | Freq: Every day | ORAL | Status: DC | PRN

## 2017-01-31 MED ORDER — POTASSIUM CHLORIDE CRYS ER 20 MEQ PO TBCR
20.0000 meq | EXTENDED_RELEASE_TABLET | Freq: Every day | ORAL | Status: DC
  Administered 2017-01-31 – 2017-02-02 (×3): 20 meq via ORAL
  Filled 2017-01-31 (×3): qty 1

## 2017-01-31 MED ORDER — SODIUM CHLORIDE 0.9% FLUSH
3.0000 mL | Freq: Two times a day (BID) | INTRAVENOUS | Status: DC
  Administered 2017-02-01 – 2017-02-02 (×3): 3 mL via INTRAVENOUS

## 2017-01-31 MED ORDER — MORPHINE SULFATE 15 MG PO TABS
15.0000 mg | ORAL_TABLET | ORAL | Status: DC | PRN
  Administered 2017-01-31: 15 mg via ORAL
  Filled 2017-01-31: qty 1

## 2017-01-31 MED ORDER — ALBUTEROL SULFATE HFA 108 (90 BASE) MCG/ACT IN AERS: 2.0000 | INHALATION_SPRAY | RESPIRATORY_TRACT | Status: DC | PRN

## 2017-01-31 MED ORDER — IOPAMIDOL (ISOVUE-370) INJECTION 76%
INTRAVENOUS | Status: AC
  Filled 2017-01-31: qty 100

## 2017-01-31 MED ORDER — ACETAMINOPHEN 650 MG RE SUPP: 650.0000 mg | Freq: Four times a day (QID) | RECTAL | Status: DC | PRN

## 2017-01-31 MED ORDER — MOMETASONE FURO-FORMOTEROL FUM 200-5 MCG/ACT IN AERO
2.0000 | INHALATION_SPRAY | Freq: Two times a day (BID) | RESPIRATORY_TRACT | Status: DC
  Administered 2017-01-31 – 2017-02-02 (×4): 2 via RESPIRATORY_TRACT
  Filled 2017-01-31: qty 8.8

## 2017-01-31 MED ORDER — PANTOPRAZOLE SODIUM 40 MG PO TBEC
40.0000 mg | DELAYED_RELEASE_TABLET | Freq: Every day | ORAL | Status: DC
  Administered 2017-01-31: 40 mg via ORAL
  Filled 2017-01-31: qty 1

## 2017-01-31 MED ORDER — DILTIAZEM HCL 30 MG PO TABS
30.0000 mg | ORAL_TABLET | Freq: Three times a day (TID) | ORAL | Status: DC
  Administered 2017-01-31 – 2017-02-02 (×7): 30 mg via ORAL
  Filled 2017-01-31 (×7): qty 1

## 2017-01-31 MED ORDER — FUROSEMIDE 40 MG PO TABS
40.0000 mg | ORAL_TABLET | Freq: Every day | ORAL | Status: DC
  Administered 2017-01-31 – 2017-02-02 (×3): 40 mg via ORAL
  Filled 2017-01-31 (×3): qty 1

## 2017-01-31 MED ORDER — SUCRALFATE 1 G PO TABS
1.0000 g | ORAL_TABLET | Freq: Four times a day (QID) | ORAL | Status: DC
  Administered 2017-01-31 – 2017-02-02 (×8): 1 g via ORAL
  Filled 2017-01-31 (×8): qty 1

## 2017-01-31 MED ORDER — MORPHINE SULFATE 15 MG PO TABS
15.0000 mg | ORAL_TABLET | ORAL | Status: DC | PRN
Start: 2017-01-31 — End: 2017-02-02
  Administered 2017-01-31 – 2017-02-02 (×6): 15 mg via ORAL
  Filled 2017-01-31 (×6): qty 1

## 2017-01-31 MED ORDER — FLEET ENEMA 7-19 GM/118ML RE ENEM: 1.0000 | ENEMA | Freq: Once | RECTAL | Status: DC | PRN

## 2017-01-31 MED ORDER — METHYLPREDNISOLONE SODIUM SUCC 125 MG IJ SOLR
125.0000 mg | Freq: Once | INTRAMUSCULAR | Status: AC
  Administered 2017-01-31: 125 mg via INTRAVENOUS
  Filled 2017-01-31: qty 2

## 2017-01-31 MED ORDER — LEVALBUTEROL HCL 0.63 MG/3ML IN NEBU
0.6300 mg | INHALATION_SOLUTION | Freq: Four times a day (QID) | RESPIRATORY_TRACT | Status: DC
  Administered 2017-01-31: 0.63 mg via RESPIRATORY_TRACT
  Filled 2017-01-31: qty 3

## 2017-01-31 MED ORDER — CALCIUM CARBONATE-VITAMIN D 500-200 MG-UNIT PO TABS
1.0000 | ORAL_TABLET | Freq: Every day | ORAL | Status: DC
  Administered 2017-01-31 – 2017-02-02 (×3): 1 via ORAL
  Filled 2017-01-31 (×3): qty 1

## 2017-01-31 MED ORDER — BOOST PLUS PO LIQD
237.0000 mL | Freq: Two times a day (BID) | ORAL | Status: DC
  Administered 2017-01-31 – 2017-02-01 (×3): 237 mL via ORAL
  Filled 2017-01-31 (×3): qty 237

## 2017-01-31 MED ORDER — IOPAMIDOL (ISOVUE-370) INJECTION 76%
100.0000 mL | Freq: Once | INTRAVENOUS | Status: AC | PRN
  Administered 2017-01-31: 80 mL via INTRAVENOUS

## 2017-01-31 MED ORDER — ALBUTEROL SULFATE (2.5 MG/3ML) 0.083% IN NEBU: 2.5000 mg | INHALATION_SOLUTION | RESPIRATORY_TRACT | Status: DC | PRN

## 2017-01-31 MED ORDER — FOLIC ACID 1 MG PO TABS
1.0000 mg | ORAL_TABLET | Freq: Every day | ORAL | Status: DC
  Administered 2017-01-31 – 2017-02-02 (×3): 1 mg via ORAL
  Filled 2017-01-31 (×3): qty 1

## 2017-01-31 MED ORDER — SODIUM CHLORIDE 0.9 % IV SOLN: 250.0000 mL | INTRAVENOUS | Status: DC | PRN

## 2017-01-31 MED ORDER — ONDANSETRON HCL 4 MG PO TABS: 4.0000 mg | ORAL_TABLET | Freq: Four times a day (QID) | ORAL | Status: DC | PRN

## 2017-01-31 MED ORDER — IPRATROPIUM BROMIDE 0.02 % IN SOLN
0.5000 mg | Freq: Three times a day (TID) | RESPIRATORY_TRACT | Status: DC
  Administered 2017-01-31 – 2017-02-02 (×6): 0.5 mg via RESPIRATORY_TRACT
  Filled 2017-01-31 (×6): qty 2.5

## 2017-01-31 MED ORDER — AMPICILLIN-SULBACTAM SODIUM 3 (2-1) G IJ SOLR
3.0000 g | Freq: Four times a day (QID) | INTRAMUSCULAR | Status: DC
  Administered 2017-01-31 – 2017-02-02 (×8): 3 g via INTRAVENOUS
  Filled 2017-01-31 (×9): qty 3

## 2017-01-31 MED ORDER — SUCRALFATE 1 G PO TABS: 1.0000 g | ORAL_TABLET | Freq: Three times a day (TID) | ORAL | Status: DC

## 2017-01-31 NOTE — ED Triage Notes (Signed)
Per EMS pt coming from home,  complaining of shortness of breath which started a couple hours ago. Wheezes and rhonchi present on arrival Pt has Hx of COPD and small cell lung cancer. Last radiation treatment was a couple weeks ago. Pt received '5mg'$  albuterol and '125mg'$  solumedrol from EMS prior to arrival to facility. Pt A&O x 4. Ems placed 18g in L AC. Currently on 2L via nasal cannula, pt reports he does not typically wear oxygen at home.

## 2017-01-31 NOTE — Evaluation (Signed)
Physical Therapy Evaluation Patient Details Name: Jeffrey Gutierrez MRN: [id number] DOB: 04-08-1956 Today's Date: 01/31/2017   History of Present Illness  61 yo male admitted with COPD exac. Hx of stage IV lung cancer with mets, COPD, HIV, HTN.   Clinical Impression  On eval, pt required Min guard assist for mobility. He walked ~250 feet while holding on to IV pole. O2 sats 88% on 2L O2, dyspnea 2/4 during ambulation. Recommend daily ambulation with nursing staff as able. Will follow.     Follow Up Recommendations Supervision for mobility/OOB    Equipment Recommendations  None recommended by PT    Recommendations for Other Services       Precautions / Restrictions Precautions Precautions: Fall Precaution Comments: monitor O2 sats Restrictions Weight Bearing Restrictions: No      Mobility  Bed Mobility Overal bed mobility: Modified Independent             General bed mobility comments: HOB elevated  Transfers Overall transfer level: Needs assistance   Transfers: Sit to/from Stand Sit to Stand: Min guard         General transfer comment: close guard for safety  Ambulation/Gait Ambulation/Gait assistance: Min guard Ambulation Distance (Feet): 250 Feet   Gait Pattern/deviations: Step-through pattern;Decreased stride length     General Gait Details: close guard for safety. Pt held onto IV pole. O2 sats 88% on 2 L O2, dyspnea 3/4. Pt fatigues fairly easily.  Stairs            Wheelchair Mobility    Modified Rankin (Stroke Patients Only)       Balance                                             Pertinent Vitals/Pain Pain Assessment: No/denies pain    Home Living Family/patient expects to be discharged to:: Private residence Living Arrangements: Spouse/significant other Available Help at Discharge: Family Type of Home: Mobile home Home Access: Stairs to enter Entrance Stairs-Rails: Can reach both Entrance Stairs-Number of  Steps: 3 Home Layout: One level        Prior Function Level of Independence: Independent               Hand Dominance        Extremity/Trunk Assessment   Upper Extremity Assessment Upper Extremity Assessment: Overall WFL for tasks assessed    Lower Extremity Assessment Lower Extremity Assessment: Generalized weakness    Cervical / Trunk Assessment Cervical / Trunk Assessment: Normal  Communication   Communication: No difficulties  Cognition Arousal/Alertness: Awake/alert Behavior During Therapy: WFL for tasks assessed/performed Overall Cognitive Status: Within Functional Limits for tasks assessed                                        General Comments      Exercises     Assessment/Plan    PT Assessment Patient needs continued PT services  PT Problem List Decreased strength;Decreased mobility;Decreased activity tolerance       PT Treatment Interventions Gait training;Therapeutic activities;Therapeutic exercise;Patient/family education;Functional mobility training    PT Goals (Current goals can be found in the Care Plan section)  Acute Rehab PT Goals Patient Stated Goal: to feel better PT Goal Formulation: With patient Time For Goal Achievement: 02/14/17 Potential to  Achieve Goals: Good    Frequency Min 3X/week   Barriers to discharge        Co-evaluation               AM-PAC PT "6 Clicks" Daily Activity  Outcome Measure Difficulty turning over in bed (including adjusting bedclothes, sheets and blankets)?: None Difficulty moving from lying on back to sitting on the side of the bed? : None Difficulty sitting down on and standing up from a chair with arms (e.g., wheelchair, bedside commode, etc,.)?: A Little Help needed moving to and from a bed to chair (including a wheelchair)?: A Little Help needed walking in hospital room?: A Little Help needed climbing 3-5 steps with a railing? : A Little 6 Click Score: 20    End of  Session Equipment Utilized During Treatment: Gait belt;Oxygen Activity Tolerance: Patient tolerated treatment well Patient left: in bed;with call bell/phone within reach;with bed alarm set   PT Visit Diagnosis: Muscle weakness (generalized) (M62.81);Difficulty in walking, not elsewhere classified (R26.2)    Time: 2130-8657 PT Time Calculation (min) (ACUTE ONLY): 25 min   Charges:   PT Evaluation $PT Eval Low Complexity: 1 Procedure PT Treatments $Gait Training: 8-22 mins   PT G Codes:          Weston Anna, MPT Pager: [phone]

## 2017-01-31 NOTE — Progress Notes (Signed)
  Echocardiogram 2D Echocardiogram has been performed.  Tresa Res 01/31/2017, 2:02 PM

## 2017-01-31 NOTE — Progress Notes (Signed)
Pharmacy Antibiotic Note  Jeffrey Gutierrez is a 61 y.o. male admitted on 01/31/2017 with SOB. Pharmacy has been consulted for Unasyn dosing for aspiration pneumonia.  Plan: Unasyn 3g IV q6h. Monitor renal function, culture results as available, clinical course.   Height: '6\' 1"'$  (185.4 cm) Weight: 130 lb (59 kg) IBW/kg (Calculated) : 79.9  Temp (24hrs), Avg:97.9 F (36.6 C), Min:97.9 F (36.6 C), Max:97.9 F (36.6 C)   Recent Labs Lab 01/31/17 0352  WBC 3.6*  CREATININE 0.65    Estimated Creatinine Clearance: 84 mL/min (by C-G formula based on SCr of 0.65 mg/dL).    Allergies  Allergen Reactions  . Dihydrocodeine Anaphylaxis  . Hydrocodone Anaphylaxis and Other (See Comments)    Throat closes up  Patient states PCP gave 11/2015 and he has been tolerating it.  . Doxycycline Nausea And Vomiting  . Linzess [Linaclotide] Diarrhea    Antimicrobials this admission: 5/16 >> Unasyn >>  Dose adjustments this admission: --  Microbiology results: None ordered  Thank you for allowing pharmacy to be a part of this patient's care.   Lindell Spar, PharmD, BCPS Pager: [phone] 01/31/2017 10:51 AM

## 2017-01-31 NOTE — ED Notes (Signed)
Pt completed breathing Tx. Given water.

## 2017-01-31 NOTE — ED Notes (Addendum)
Informed patient that EDP wants patient to ambulate in hallway with pulse Ox, so we can monitor Oxygen level.  Patient states that he doesn't want to get up and walk right now.   Informed patient that I would take him off oxygen and see how his saturation does while on room air. Patient maintaining between 89-90% on room air.

## 2017-01-31 NOTE — ED Notes (Signed)
Hospitalist Provider at bedside. 

## 2017-01-31 NOTE — Progress Notes (Signed)
**  Preliminary report by tech**  Bilateral lower extremity venous duplex completed. There is no evidence of deep or superficial vein thrombosis involving the right and left lower extremities. All visualized vessels appear patent and compressible. There is no evidence of Baker's cysts bilaterally.  01/31/17 11:28 AM Jeffrey Gutierrez RVT

## 2017-01-31 NOTE — ED Notes (Signed)
Bed: WA20 Expected date:  Expected time:  Means of arrival:  Comments: 61 yo M  Shortness of breath

## 2017-01-31 NOTE — ED Notes (Signed)
Patient O2 went to 88 while going from lying to sitting on side of bed.  Patient walked from room about 6 feet and was so SOB and exhausted, patient had to stop and hold onto rail in hallway. Dr Tomi Bamberger saw patient in hallway and explained to her that patient unable to go any further and O2 87-88% on room air.  Tomi Bamberger EDP informed patient that he will be admitted due to him not being able to go home this weak.  Assisted patient back to room and put back on O2 via nasal cannula.

## 2017-01-31 NOTE — ED Notes (Signed)
Hospitalist at bedside 

## 2017-01-31 NOTE — H&P (Addendum)
HISTORY AND PHYSICAL       PATIENT DETAILS Name: Jeffrey Gutierrez Age: 61 y.o. Sex: male Date of Birth: March 28, 1956 Admit Date: 01/31/2017 WEX:HBZJIRC, Jeffrey Reichmann, MD   Patient coming from: Home   CHIEF COMPLAINT:  Shortness of breath  HPI: Jeffrey Gutierrez is a 61 y.o. male with medical history significant of stage IV adenocarcinoma of the lung (on atezolizumab every 3 weeks at [facility]), HIV on antiretroviral therapy, COPD on home O2 on a as needed basis, PBC with early cirrhosis presented to the hospital for shortness of breath that started yesterday evening. Per patient, over the past few days he has had slight worsening of his usual "cough"-which much more productive yellowish phlegm than usual. He still occasionally smokes. Last evening he started slowly developing significant worsening of his shortness of breath, as a result EMS was called-per patient he was wheezing and was not getting better with his usual bronchodilator treatments at home. He was given Solu-Medrol and albuterol while in route to the emergency room, he was provided with supportive care while in the emergency room-although he felt better than in his initial presentation-he still does not feel that he is back to his usual baseline. The emergency room M.D. tried to see if he could ambulate and be discharged home-however he developed shortness of breath with minimal ambulation, as a result the [facility] was asked to admit this patient for further evaluation and treatment.  Patient does acknowledge some mild lower extremity edema-specialty around his ankles.  Patient denies any fever, chills, nausea, vomiting or diarrhea. He also denies any abdominal pain.  ED Course:  In the emergency room-patient was found to be persistently hypoxic to the mid 80s on room air, but with 2-3 L of oxygen he is O2 saturation is in the low 90s. Patient was given Solu-Medrol, numerous bronchodilator treatments in the  emergency room. CT angiogram of the chest was negative for pulmonary embolism. CBC was mostly on the unremarkable except for mild thrombocytopenia, electrolytes were relatively within normal limits except for marked hypoalbuminemia. EKG showed atrial tachycardia.  Note: Lives at: Home Mobility:  Independent Chronic Indwelling Foley:no   REVIEW OF SYSTEMS:  Constitutional:   No night sweats,  Fevers, chills  HEENT:    No headaches, Dysphagia,Tooth/dental problems,Sore throat,  No sneezing, itching, ear ache, nasal congestion, post nasal drip  Cardio-vascular: No chest pain,Orthopnea, PND, anasarca, palpitations  GI:  No heartburn, indigestion, abdominal pain, nausea, vomiting, diarrhea, melena or hematochezia  Resp: No hemoptysis,plueritic chest pain.   Skin:  No rash or lesions.  GU:  No dysuria, change in color of urine, no urgency or frequency.  No flank pain.  Musculoskeletal: No joint pain or swelling.  No decreased range of motion.  No back pain.  Endocrine: No heat intolerance, no cold intolerance, no polyuria, no polydipsia  Psych: No change in mood or affect. No depression or anxiety.  No memory loss.   ALLERGIES:   Allergies  Allergen Reactions  . Dihydrocodeine Anaphylaxis  . Hydrocodone Anaphylaxis and Other (See Comments)    Throat closes up  Patient states PCP gave 11/2015 and he has been tolerating it.  . Doxycycline Nausea And Vomiting  . Linzess [Linaclotide] Diarrhea    PAST MEDICAL HISTORY: Past Medical History:  Diagnosis Date  . Bone cancer ([place]) dx'd 03/2016   metastatic disease  . COPD (chronic obstructive pulmonary disease) ([place])   . Diverticulitis   .  Essential hypertension   . GERD (gastroesophageal reflux disease)   . Hiatal hernia   . Hyperplastic rectal polyp   . Lung cancer ([place]) dx'd 03/2016   chemo ongoing; xrt comp   . PBC (primary biliary cirrhosis)    Diagnosed 2006  . S/P colonoscopy 2009   Pancolonic diverticula,  hyperplastic rectal polyps    PAST SURGICAL HISTORY: Past Surgical History:  Procedure Laterality Date  . Arm Surgery Left    ulnar nerve release  . CHOLECYSTECTOMY    . COLONOSCOPY  2009   Pancolonic diverticula, hyperplastic rectal polyps  . COLONOSCOPY [name] 03/24/2015   RMR: Single colonic polyp removed as described above. Pancolonic diverticulosis. Abnormal sigmoid colon consistent with stigmata of recent diverticulitis.   Marland Kitchen ESOPHAGOGASTRODUODENOSCOPY [name] 06/28/2015   RMR: mild erosive reflux/small HH  . INGUINAL HERNIA REPAIR Right Feb 2012   Dr. Arnoldo Morale   . INGUINAL HERNIA REPAIR Left 11/12/2015   Procedure: HERNIA REPAIR INGUINAL ADULT WITH MESH;  Surgeon: Aviva Signs, MD;  Location: [facility];  Service: General;  Laterality: Left;  . INSERTION OF MESH Left 11/12/2015   Procedure: INSERTION OF MESH;  Surgeon: Aviva Signs, MD;  Location: [facility];  Service: General;  Laterality: Left;  . LIVER BIOPSY    . NASAL SEPTUM SURGERY      MEDICATIONS AT HOME: Prior to Admission medications   Medication Sig Start Date End Date Taking? Authorizing Provider  albuterol (PROVENTIL HFA;VENTOLIN HFA) 108 (90 Base) MCG/ACT inhaler Inhale 2 puffs into the lungs every 4 (four) hours as needed for wheezing or shortness of breath. 09/17/15  Yes Mesner, Corene Cornea, MD  budesonide-formoterol (SYMBICORT) 160-4.5 MCG/ACT inhaler Inhale 2 puffs into the lungs 2 (two) times daily.   Yes [provider]  CALCIUM-VITAMIN D PO Take 1 tablet by mouth daily.   Yes [provider]  dolutegravir (TIVICAY) 50 MG tablet Take 1 tablet (50 mg total) by mouth daily. 07/07/16  Yes Eugenie Filler, MD  DULoxetine (CYMBALTA) 30 MG capsule Take 30 mg by mouth every morning.  05/09/16  Yes [provider]  emtricitabine-tenofovir AF (DESCOVY) 200-25 MG tablet Take 1 tablet by mouth daily. 07/07/16  Yes Eugenie Filler, MD  feeding supplement (BOOST HIGH PROTEIN) LIQD Take 1 Container by mouth 2 (two)  times daily between meals.   Yes [provider]  folic acid (FOLVITE) 1 MG tablet Take 1 mg by mouth every morning.    Yes [provider]  furosemide (LASIX) 20 MG tablet Take 20 mg by mouth 2 (two) times daily as needed. 40 mg in the morning and 20 mg at bedtime. 05/12/16  Yes [provider]  ibuprofen (ADVIL,MOTRIN) 200 MG tablet Take 200 mg by mouth every 6 (six) hours as needed for mild pain or moderate pain. Reported on 12/14/2015   Yes [provider]  Melatonin 3 MG TABS Take 3 mg by mouth at bedtime.   Yes [provider]  morphine (MS CONTIN) 15 MG 12 hr tablet Take 1 tablet (15 mg total) by mouth every 12 (twelve) hours. 01/04/17  Yes Bruning, Ashlyn, PA-C  morphine (MSIR) 15 MG tablet Take 1 tablet (15 mg total) by mouth every 4 (four) hours as needed for severe pain. 01/19/17  Yes Bruning, Ashlyn, PA-C  Multiple Vitamin (MULTIVITAMIN WITH MINERALS) TABS tablet Take 1 tablet by mouth every morning.    Yes [provider]  OCALIVA 5 MG TABS Take 1 tablet by mouth daily. Patient taking differently:  Take 5 mg by mouth every morning.  03/02/16  Yes Annitta Needs, NP  omeprazole (PRILOSEC) 20 MG capsule TAKE 1 CAPSULE (20 MG TOTAL) BY MOUTH DAILY. 08/28/16  Yes Carlis Stable, NP  polyethylene glycol (MIRALAX / GLYCOLAX) packet Take 34 g by mouth 2 (two) times daily.    Yes [provider]  sucralfate (CARAFATE) 1 g tablet TAKE 1 TABLET BY MOUTH 4 TIMES A DAY WITH MEALS AND AT BEDTIME 06/27/16  Yes Carlis Stable, NP  tiotropium (SPIRIVA) 18 MCG inhalation capsule Place 18 mcg into inhaler and inhale every morning.    Yes [provider]    FAMILY HISTORY: Family History  Problem Relation Age of Onset  . Lung cancer Mother   . Pneumonia Father   . Lung cancer Father   . Pancreatitis Father      SOCIAL HISTORY:  reports that he has been smoking Cigarettes.  He has a 10.75 pack-year smoking history. He has never used  smokeless tobacco. He reports that he drinks alcohol. He reports that he does not use drugs.  PHYSICAL EXAM: Blood pressure 110/69, pulse (!) 102, temperature 97.9 F (36.6 C), temperature source Oral, resp. rate 18, height '6\' 1"'$  (1.854 m), weight 59 kg (130 lb), SpO2 93 %.  General appearance :Awake, alert, and mild distress-but easily speaking in full sentences and not using any accessory muscles of respiration. He has a very flat affect- looks very cachectic and much more than his stated age. Speech is slow but clear. Eyes:, pupils equally reactive to light and accomodation,no scleral icterus.Pink conjunctiva HEENT: Atraumatic and Normocephalic Neck: supple, no JVD. No cervical lymphadenopathy. No thyromegaly Resp: Markedly decreased air entry at bases-moving air in the upper lobes, some scattered rhonchi.  CVS: S1 S2 regular, tachycardic GI: Bowel sounds present, Non tender and not distended with no gaurding, rigidity or rebound.No organomegaly Extremities: B/L Lower Ext shows ++edema-especially around the lateral ankles, both legs are warm to touch Neurology:  speech clear,Non focal, sensation is grossly intact. Psychiatric: Normal judgment and insight. Alert and oriented x 3. Normal mood. Musculoskeletal:No digital cyanosis Skin:No Rash, warm and dry Wounds:[name]  LABS ON ADMISSION:  I have personally reviewed following labs and imaging studies  CBC:  Recent Labs Lab 01/31/17 0352  WBC 3.6*  NEUTROABS 2.9  HGB 11.1*  HCT 33.5*  MCV 95.7  PLT 105*    Basic Metabolic Panel:  Recent Labs Lab 01/31/17 0352  NA 135  K 3.7  CL 98*  CO2 31  GLUCOSE 124*  BUN 11  CREATININE 0.65  CALCIUM 8.4*    GFR: Estimated Creatinine Clearance: 84 mL/min (by C-G formula based on SCr of 0.65 mg/dL).  Liver Function Tests:  Recent Labs Lab 01/31/17 0352  AST 22  ALT 11*  ALKPHOS 112  BILITOT 0.7  PROT 6.6  ALBUMIN 2.7*   No results for input(s): LIPASE, AMYLASE in the  last 168 hours. No results for input(s): AMMONIA in the last 168 hours.  Coagulation Profile: No results for input(s): INR, PROTIME in the last 168 hours.  Cardiac Enzymes: No results for input(s): CKTOTAL, CKMB, CKMBINDEX, TROPONINI in the last 168 hours.  BNP (last 3 results) No results for input(s): PROBNP in the last 8760 hours.  HbA1C: No results for input(s): HGBA1C in the last 72 hours.  CBG: No results for input(s): GLUCAP in the last 168 hours.  Lipid Profile: No results for input(s): CHOL, HDL, LDLCALC, TRIG, CHOLHDL, LDLDIRECT in the  last 72 hours.  Thyroid Function Tests: No results for input(s): TSH, T4TOTAL, FREET4, T3FREE, THYROIDAB in the last 72 hours.  Anemia Panel: No results for input(s): VITAMINB12, FOLATE, FERRITIN, TIBC, IRON, RETICCTPCT in the last 72 hours.  Urine analysis:    Component Value Date/Time   COLORURINE AMBER (A) 04/27/2016 2344   APPEARANCEUR HAZY (A) 04/27/2016 2344   LABSPEC 1.010 04/27/2016 2344   PHURINE 7.0 04/27/2016 2344   GLUCOSEU NEGATIVE 04/27/2016 2344   HGBUR NEGATIVE 04/27/2016 2344   BILIRUBINUR NEGATIVE 04/27/2016 2344   KETONESUR NEGATIVE 04/27/2016 2344   PROTEINUR NEGATIVE 04/27/2016 2344   NITRITE NEGATIVE 04/27/2016 2344   LEUKOCYTESUR NEGATIVE 04/27/2016 2344    Sepsis Labs: Lactic Acid, Venous    Component Value Date/Time   LATICACIDVEN 0.60 06/26/2016 0033     Microbiology: No results found for this or any previous visit (from the past 240 hour(s)).    RADIOLOGIC STUDIES ON ADMISSION: Ct Angio Chest Pe W/cm &/or Wo Cm  Result Date: 01/31/2017 CLINICAL DATA:  Shortness of breath and hypoxia. Lung cancer with bone metastases. Undergoing chemotherapy. EXAM: CT ANGIOGRAPHY CHEST WITH CONTRAST TECHNIQUE: Multidetector CT imaging of the chest was performed using the standard protocol during bolus administration of intravenous contrast. Multiplanar CT image reconstructions and MIPs were obtained to  evaluate the vascular anatomy. CONTRAST:  80 cc Isovue 370 intravenous COMPARISON:  06/25/2016 FINDINGS: Cardiovascular: Satisfactory opacification of the pulmonary arteries to the segmental level. When allowing for intermittent motion, no evidence of pulmonary embolism. Normal heart size. No pericardial effusion. Mediastinum/Nodes: Diffuse circumferential thickening of the esophagus with prominent mucosal enhancement, also seen previously. No adenopathy. Lungs/Pleura: Bullous emphysema. Small right pleural effusion which is new. History of pleural-based lung cancer spread on the right with small areas of pleural thickening. Extensive airway debris in the bilateral lower lobes New, from local comparisons, is 9 mm nodule in the left upper lobe, 11:48. New 9 mm right upper lobe nodule on 11:55. Stable subpleural nodule along the lower right major fissure, 11:69. Patient's most recent staging scan was PET/CT 12/05/2016 at outside hospital. Upper Abdomen: No acute or aggressive finding. Musculoskeletal: Known osseous metastatic disease, most notably confluent sclerosis in the T5 vertebral body. There are multiple erosive rib lesions on the right. Some of these could be pleural based. Review of the MIP images confirms the above findings. IMPRESSION: 1. Negative for pulmonary embolism. 2. Extensive lower lobe airway debris and chronic changes of esophagitis, question aspiration. 3. History of lung cancer on the right with pleural and bony metastases. Outside staging PET-CT 12/05/2016. 4. Small right pleural effusion and mild right lower lobe atelectasis. Electronically Signed   By: Monte Fantasia M.D.   On: 01/31/2017 09:35   Dg Chest Port 1 View  Result Date: 01/31/2017 CLINICAL DATA:  Sudden onset of shortness of breath this morning. Cough. Lung cancer. EXAM: PORTABLE CHEST 1 VIEW COMPARISON:  Radiographs and CT 06/25/2016 FINDINGS: Tip of the right chest port in the mid SVC. Advanced emphysema including bullous  changes at the lung apices. No evidence of pneumothorax. Unchanged heart size and mediastinal contours. No confluent airspace disease, pulmonary edema or large pleural effusion. Peri-fissural nodules on prior CT not well seen. Lower lobe bronchial thickening is again suspected. Known osseous metastatic disease in the thoracic spine is not well characterized radiographically. IMPRESSION: Advanced emphysema with bullous changes at the apices. Lower lobe bronchial thickening. No radiographic evidence of acute abnormality. Electronically Signed   By: Fonnie Birkenhead.D.  On: 01/31/2017 04:37    I have independently reviewed images of chest xray and a CT angiogram of the chest-no PE and pneumonia. o  EKG:  Independently reviewed-atrial tachycardia  ASSESSMENT AND PLAN: Acute on chronic hypoxemic respiratory failure due to COPD exacerbation: Will admit to the telemetry unit, continue steroids and scheduled bronchodilators. Patient has history of esophagitis, and could have had reflux/aspiration into his lungs (CT scan chest shows a lot of debris in the lower airways-no history of vomiting)-hence we will empirically cover him with Unasyn. He will also be on antireflux therapy with PPI and Carafate. Although he has HIV-he has no evidence of pneumonia/interstitial pattern to suggest infection at this time. He will be monitored closely and it telemetry unit, his clinical course will be followed closely. I suspect he will probably require to use home O2 regularly than on a prn basis on discharge.  Mild bilateral lower extremity edema: I suspect this is probably from hypoalbuminemia-but given ongoing malignancy we'll check a lower extremity Doppler to make sure he does not have DVT. An echocardiogram will also be checked. For now he will be on low-dose Lasix, follow clinically.  Atrial tachycardia: During my initial evaluation-heart rate was in the 120s-130s-P waves clearly seen on EKG and on telemetry  monitoring. Subsequently-heart rate has now slowed down to the low 100s range. For now I will put him on low-dose Cardizem-will avoid beta blockers given COPD-and monitor on telemetry. Echo has been ordered.  History of primary biliary cholangitis with early cirrhosis: Followed by gastroenterology in [place] acid will be continued.  HIV: Last CD4 count in February was 64-antiretroviral therapy will be continued. We will recheck a CD4 count. As noted above-no evidence of PCP on chest x-ray or CT chest, suspect hypoxia likely secondary to COPD exacerbation.  Stage IV adenocarcinoma of the lung with bone metastases: [facility] at [facility] is on atezolizumab every 3 weeks. He does have a Port-A-Cath in place. Note-most recent outpatient oncology note reviewed in care everywhere.   Mild pancytopenia: I suspect this is probably related to his underlying cancer/chemotherapy possibly HIV. Plans are to monitor for now-will go ahead and check vitamin B12 and folate levels.  Esophagitis: Continue PPI and Carafate  Chronic pain syndrome/constipation: Continue usual narcotics and bowel regimen. Follow  Cachexia: Suspect secondary to underlying malignancy-obtain nutrition consult  Generalized weakness/deconditioning: Due to numerous underlying comorbidities-probably worsened due to acute illness. Obtain PT evaluation.  Goals of care: Unfortunate 61 year old with HIV, stage IV lung cancer, PBC with early cirrhosis-who appears to be significantly cachectic-being admitted for acute hypoxic respiratory failure due to COPD exacerbation. He wishes to be a full code for now, briefly spoke to him and his spouse at bedside regarding poor overall prognoses and some potential for deterioration post admission. For now he will be a full code, depending on his clinical course-we may need to consult palliative care at some point.  Further plan will depend as patient's clinical course evolves and further  radiologic and laboratory data become available. Patient will be monitored closely.  Above noted plan was discussed with patient/spouse face to face at bedside, they were in agreement.   CONSULTS: None  DVT Prophylaxis: Prophylactic Lovenox   Code Status: Full Code  Disposition Plan:  Discharge back home  possibly in 2-3 days, depending on clinical course  Admission status:  Inpatient going to tele  The medical decision making on this patient was of high complexity and the patient is at high risk for clinical  deterioration, therefore this is a level 3 visit.  Total time spent  55 minutes.Greater than 50% of this time was spent in counseling, explanation of diagnosis, planning of further management, and coordination of care.  Oren Binet [facility] Pager [phone]  If 7PM-7AM, please contact night-coverage [email or web address] Password [facility] 01/31/2017, 9:54 AM

## 2017-01-31 NOTE — ED Notes (Signed)
Patient transported to CT 

## 2017-01-31 NOTE — ED Provider Notes (Signed)
[facility] DEPT Provider Note   CSN: [id number] Arrival date & time: 01/31/17  0259  By signing my name below, I, Collene Leyden, attest that this documentation has been prepared under the direction and in the presence of Rolland Porter, MD. Electronically Signed: Collene Leyden, Scribe. 01/31/17. 3:50 AM.  Time seen 03:42 AM  History   Chief Complaint Chief Complaint  Patient presents with  . Shortness of Breath   HPI Comments: Jeffrey Gutierrez is a 61 y.o. male with a history of COPD, breast cancer, GERD, and stage IV lung cancer treated at [facility], who presents to the Emergency Department by ambulance, complaining of intermittent shortness of breath that began earlier tonight. Patient states he is "short-winded", worse this afternoon. Patient was admitted last fall for pneumonia. Symptoms are not similar to prior pneumonia admission. Patient has a cancer physician and pulmonologist. 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 Patient reports an associated productive cough of yellow sputum. Brief relief with albuterol inhaler. Patient is an occasional smoker, 1 pack every two weeks. Patient was given solumedrol and albuterol while en route to the ED by EMS, which improved his SOB.  Patient does not typically wear oxygen at home.  Patient denies any rhinorrhea, chest pain, leg swelling, diaphoresis, fever, chills, nausea, or vomiting.   Oxygen was 91% on arrival, patient states his pulse ox is 94% at baseline.    The history is provided by the patient. No language interpreter was used.   PCP Sharilyn Sites, MD Pulmonary Dr Luan Pulling  Past Medical History:  Diagnosis Date  . Bone cancer ([place])   . COPD (chronic obstructive pulmonary disease) ([place])   . Diverticulitis   . Essential hypertension   . GERD (gastroesophageal reflux disease)   . Hiatal hernia   . Hyperplastic rectal polyp   . Lung cancer ([place])     . PBC (primary biliary cirrhosis)    Diagnosed 2006  . S/P colonoscopy 2009   Pancolonic diverticula, hyperplastic rectal polyps    Patient Active Problem List   Diagnosis Date Noted  . Gout 07/20/2016  . Dysphagia, pharyngoesophageal phase   . HIV disease ([place])   . Pressure injury of skin 07/02/2016  . Aspiration pneumonia of both lower lobes ([place])   . Normocytic anemia 05/27/2016  . Antineoplastic chemotherapy induced pancytopenia ([place]) 05/27/2016  . Depression with anxiety 05/27/2016  . GERD (gastroesophageal reflux disease) 05/27/2016  . Cellulitis 05/27/2016  . COPD (chronic obstructive pulmonary disease) ([place]) 04/28/2016  . Protein-calorie malnutrition, severe 04/28/2016  . 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 Metastasis to bone ([place]) 04/01/2016  . Adenocarcinoma of right lung, stage 4 ([place]) 04/01/2016  . Loss of weight 12/22/2015  . 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 Hiatal hernia   . Esophagitis   . Diverticulosis of colon without hemorrhage   . History of colonic polyps   . Constipation 02/04/2015  . CHOLECYSTECTOMY, HX OF 01/20/2009    Past Surgical History:  Procedure Laterality Date  . Arm Surgery Left    ulnar nerve release  . CHOLECYSTECTOMY    . COLONOSCOPY  2009   Pancolonic diverticula, hyperplastic rectal polyps  . COLONOSCOPY [name] 03/24/2015   RMR: Single colonic polyp removed as described above. Pancolonic diverticulosis. Abnormal sigmoid colon consistent with stigmata of recent diverticulitis.   Marland Kitchen ESOPHAGOGASTRODUODENOSCOPY [name] 06/28/2015   RMR: mild erosive reflux/small HH  . INGUINAL HERNIA REPAIR Right  Feb 2012   Dr. Arnoldo Morale   . INGUINAL HERNIA REPAIR Left 11/12/2015   Procedure: HERNIA REPAIR INGUINAL ADULT WITH MESH;  Surgeon: Aviva Signs, MD;  Location: [facility];  Service: General;  Laterality: Left;  . INSERTION OF MESH Left 11/12/2015   Procedure: INSERTION OF MESH;  Surgeon: Aviva Signs, MD;  Location: [facility];  Service: General;   Laterality: Left;  . LIVER BIOPSY    . NASAL SEPTUM SURGERY         Home Medications    Prior to Admission medications   Medication Sig Start Date End Date Taking? Authorizing Provider  albuterol (PROVENTIL HFA;VENTOLIN HFA) 108 (90 Base) MCG/ACT inhaler Inhale 2 puffs into the lungs every 4 (four) hours as needed for wheezing or shortness of breath. 09/17/15  Yes Mesner, Corene Cornea, MD  budesonide-formoterol (SYMBICORT) 160-4.5 MCG/ACT inhaler Inhale 2 puffs into the lungs 2 (two) times daily.   Yes [provider]  CALCIUM-VITAMIN D PO Take 1 tablet by mouth daily.   Yes [provider]  dolutegravir (TIVICAY) 50 MG tablet Take 1 tablet (50 mg total) by mouth daily. 07/07/16  Yes Eugenie Filler, MD  DULoxetine (CYMBALTA) 30 MG capsule Take 30 mg by mouth every morning.  05/09/16  Yes [provider]  emtricitabine-tenofovir AF (DESCOVY) 200-25 MG tablet Take 1 tablet by mouth daily. 07/07/16  Yes Eugenie Filler, MD  feeding supplement (BOOST HIGH PROTEIN) LIQD Take 1 Container by mouth 2 (two) times daily between meals.   Yes [provider]  folic acid (FOLVITE) 1 MG tablet Take 1 mg by mouth every morning.    Yes [provider]  furosemide (LASIX) 20 MG tablet Take 20 mg by mouth 2 (two) times daily as needed. 40 mg in the morning and 20 mg at bedtime. 05/12/16  Yes [provider]  ibuprofen (ADVIL,MOTRIN) 200 MG tablet Take 200 mg by mouth every 6 (six) hours as needed for mild pain or moderate pain. Reported on 12/14/2015   Yes [provider]  Melatonin 3 MG TABS Take 3 mg by mouth at bedtime.   Yes [provider]  morphine (MS CONTIN) 15 MG 12 hr tablet Take 1 tablet (15 mg total) by mouth every 12 (twelve) hours. 01/04/17  Yes Bruning, Ashlyn, PA-C  morphine (MSIR) 15 MG tablet Take 1 tablet (15 mg total) by mouth every 4 (four) hours as needed for severe pain. 01/19/17  Yes Bruning, Ashlyn, PA-C  Multiple  Vitamin (MULTIVITAMIN WITH MINERALS) TABS tablet Take 1 tablet by mouth every morning.    Yes [provider]  OCALIVA 5 MG TABS Take 1 tablet by mouth daily. Patient taking differently: Take 5 mg by mouth every morning.  03/02/16  Yes Annitta Needs, NP  omeprazole (PRILOSEC) 20 MG capsule TAKE 1 CAPSULE (20 MG TOTAL) BY MOUTH DAILY. 08/28/16  Yes Carlis Stable, NP  polyethylene glycol (MIRALAX / GLYCOLAX) packet Take 34 g by mouth 2 (two) times daily.    Yes [provider]  sucralfate (CARAFATE) 1 g tablet TAKE 1 TABLET BY MOUTH 4 TIMES A DAY WITH MEALS AND AT BEDTIME 06/27/16  Yes Carlis Stable, NP  tiotropium (SPIRIVA) 18 MCG inhalation capsule Place 18 mcg into inhaler and inhale every morning.    Yes [provider]    Family History Family History  Problem Relation Age of Onset  . Lung cancer Mother   . Pneumonia Father   . Lung cancer Father   .  Pancreatitis Father     Social History Social History  Substance Use Topics  . Smoking status: Current Some Day Smoker    Packs/day: 0.25    Years: 43.00    Types: Cigarettes  . Smokeless tobacco: Never Used     Comment: hasn't smoked since entering the hosp  . Alcohol use 0.0 oz/week     Comment: occassional  lives at home Lives with spouse   Allergies   Dihydrocodeine; Hydrocodone; Doxycycline; and Linzess [linaclotide]   Review of Systems Review of Systems  Constitutional: Negative for chills, diaphoresis and fever.  Respiratory: Positive for cough and shortness of breath.   Cardiovascular: Negative for chest pain and leg swelling.  Gastrointestinal: Negative for nausea and vomiting.  All other systems reviewed and are negative.    Physical Exam Updated Vital Signs BP 128/90 (BP Location: Right Arm)   Pulse 97   Temp 97.9 F (36.6 C) (Oral)   Resp 19   Ht '6\' 1"'$  (1.854 m)   Wt 130 lb (59 kg)   SpO2 93%   BMI 17.15 kg/m   Vital signs normal except borderline tachycardia   Physical  Exam  Constitutional: He is oriented to person, place, and time.  Non-toxic appearance. He does not appear ill. No distress.  Thin male who appears older than his stated age.  HENT:  Head: Normocephalic and atraumatic.  Right Ear: External ear normal.  Left Ear: External ear normal.  Nose: Nose normal. No mucosal edema or rhinorrhea.  Mouth/Throat: Oropharynx is clear and moist and mucous membranes are normal. No dental abscesses or uvula swelling.  Eyes: Conjunctivae and EOM are normal. Pupils are equal, round, and reactive to light.  Neck: Normal range of motion and full passive range of motion without pain. Neck supple.  Cardiovascular: Normal rate, regular rhythm and normal heart sounds.  Exam reveals no gallop and no friction rub.   No murmur heard. Pulmonary/Chest: Effort normal. No respiratory distress. He has decreased breath sounds. He has no wheezes. He has rhonchi. He has no rales. He exhibits no tenderness and no crepitus.  During exam his pulse ox decreased to 89 percent turned oxygen on and it increased to 94 percent.   Abdominal: Soft. Normal appearance and bowel sounds are normal. He exhibits no distension. There is no tenderness. There is no rebound and no guarding.  Musculoskeletal: Normal range of motion. He exhibits no edema or tenderness.  Moves all extremities well.   Neurological: He is alert and oriented to person, place, and time. He has normal strength. No cranial nerve deficit.  Skin: Skin is warm, dry and intact. No rash noted. No erythema. No pallor.  Psychiatric: He has a normal mood and affect. His speech is normal and behavior is normal. His mood appears not anxious.  Nursing note and vitals reviewed.    ED Treatments / Results   Results for orders placed or performed during the hospital encounter of 01/31/17  Comprehensive metabolic panel  Result Value Ref Range   Sodium 135 135 - 145 mmol/L   Potassium 3.7 3.5 - 5.1 mmol/L   Chloride 98 (L) 101 - 111  mmol/L   CO2 31 22 - 32 mmol/L   Glucose, Bld 124 (H) 65 - 99 mg/dL   BUN 11 6 - 20 mg/dL   Creatinine, Ser 0.65 0.61 - 1.24 mg/dL   Calcium 8.4 (L) 8.9 - 10.3 mg/dL   Total Protein 6.6 6.5 - 8.1 g/dL   Albumin 2.7 (L)  3.5 - 5.0 g/dL   AST 22 15 - 41 U/L   ALT 11 (L) 17 - 63 U/L   Alkaline Phosphatase 112 38 - 126 U/L   Total Bilirubin 0.7 0.3 - 1.2 mg/dL   GFR calc non Af Amer >60 >60 mL/min   GFR calc Af Amer >60 >60 mL/min   Anion gap 6 5 - 15  CBC with Differential  Result Value Ref Range   WBC 3.6 (L) 4.0 - 10.5 K/uL   RBC 3.50 (L) 4.22 - 5.81 MIL/uL   Hemoglobin 11.1 (L) 13.0 - 17.0 g/dL   HCT 33.5 (L) 39.0 - 52.0 %   MCV 95.7 78.0 - 100.0 fL   MCH 31.7 26.0 - 34.0 pg   MCHC 33.1 30.0 - 36.0 g/dL   RDW 15.1 11.5 - 15.5 %   Platelets 105 (L) 150 - 400 K/uL   Neutrophils Relative % 80 %   Neutro Abs 2.9 1.7 - 7.7 K/uL   Lymphocytes Relative 14 %   Lymphs Abs 0.5 (L) 0.7 - 4.0 K/uL   Monocytes Relative 6 %   Monocytes Absolute 0.2 0.1 - 1.0 K/uL   Eosinophils Relative 0 %   Eosinophils Absolute 0.0 0.0 - 0.7 K/uL   Basophils Relative 0 %   Basophils Absolute 0.0 0.0 - 0.1 K/uL   Laboratory interpretation all normal except Hyperglycemia, persistent low total white blood cell count without neutropenia, stable anemia, stable thrombocytopenia  EKG  EKG Interpretation None       Radiology Dg Chest Port 1 View  Result Date: 01/31/2017 CLINICAL DATA:  Sudden onset of shortness of breath this morning. Cough. Lung cancer. EXAM: PORTABLE CHEST 1 VIEW COMPARISON:  Radiographs and CT 06/25/2016 FINDINGS: Tip of the right chest port in the mid SVC. Advanced emphysema including bullous changes at the lung apices. No evidence of pneumothorax. Unchanged heart size and mediastinal contours. No confluent airspace disease, pulmonary edema or large pleural effusion. Peri-fissural nodules on prior CT not well seen. Lower lobe bronchial thickening is again suspected. Known osseous  metastatic disease in the thoracic spine is not well characterized radiographically. IMPRESSION: Advanced emphysema with bullous changes at the apices. Lower lobe bronchial thickening. No radiographic evidence of acute abnormality. Electronically Signed   By: Jeb Levering M.D.   On: 01/31/2017 04:37    Procedures Procedures (including critical care time)  Medications Ordered in ED Medications  morphine (MSIR) tablet 15 mg (15 mg Oral Given 01/31/17 0542)  albuterol (PROVENTIL,VENTOLIN) solution continuous neb (10 mg/hr Nebulization Given 01/31/17 0358)  ipratropium (ATROVENT) nebulizer solution 0.5 mg (0.5 mg Nebulization Given 01/31/17 0358)  albuterol (PROVENTIL,VENTOLIN) solution continuous neb (10 mg/hr Nebulization Given 01/31/17 0635)  methylPREDNISolone sodium succinate (SOLU-MEDROL) 125 mg/2 mL injection 125 mg (125 mg Intravenous Given 01/31/17 0646)     Initial Impression / Assessment and Plan / ED Course  I have reviewed the triage vital signs and the nursing notes.  Pertinent labs & imaging results that were available during my care of the patient were reviewed by me and considered in my medical decision making (see chart for details).      DIAGNOSTIC STUDIES: Oxygen Saturation is 93% on RA, low by my interpretation.    COORDINATION OF CARE: 3:49 AM Discussed treatment plan with pt at bedside and pt agreed to plan, which includes a breathing treatment and a chest x-ray.  Recheck at 6:10 AM patient states he's feeling better, he has some improvement of his airflow and he  has lower pitched rhonchi diffusely. He was given IV Solu-Medrol and an additional albuterol nebulizer. We discussed his test results.  Recheck 07:25 AM pt is feeling much better, feels he is at his baseline. Lungs are clear. Will have nursing staff ambulate and see how he does.   Patient did not do well with ambulating. He was barely able to make it outside the door when he had to stop because he was short  of breath. His pulse ox dropped to 88%. Mariel Kansky states they have the equipment at home for oxygen however he has not used it since last fall.  08:35 AM Dr Sloan Leiter, hospitalist will see for admission. Wants CT angio chest to be done.   Final Clinical Impressions(s) / ED Diagnoses   Final diagnoses:  COPD exacerbation ([place])  Hypoxia    Plan admission  Rolland Porter, MD, FACEP     I personally performed the services described in this documentation, which was scribed in my presence. The recorded information has been reviewed and considered.  Rolland Porter, MD, Barbette Or, MD 01/31/17 [phone]

## 2017-02-01 ENCOUNTER — Encounter (HOSPITAL_COMMUNITY): Payer: Self-pay

## 2017-02-01 LAB — FOLATE RBC
Folate, RBC: >1689 ng/mL (ref >498)
Hematocrit: 36.7 % — ABNORMAL LOW (ref 37.5–51.0)

## 2017-02-01 LAB — CBC
HCT: 36.6 % — ABNORMAL LOW (ref 39.0–52.0)
Hemoglobin: 11.7 g/dL — ABNORMAL LOW (ref 13.0–17.0)
MCH: 30.6 pg (ref 26.0–34.0)
MCHC: 32 g/dL (ref 30.0–36.0)
MCV: 95.8 fL (ref 78.0–100.0)
PLATELETS: 121 10*3/uL — AB (ref 150–400)
RBC: 3.82 MIL/uL — AB (ref 4.22–5.81)
RDW: 14.5 % (ref 11.5–15.5)
WBC: 7.1 10*3/uL (ref 4.0–10.5)

## 2017-02-01 LAB — T-HELPER CELLS (CD4) COUNT (NOT AT ARMC)
CD4 T CELL HELPER: 6 % — AB (ref 33–55)
CD4 T Cell Abs: 10 /uL — ABNORMAL LOW (ref 400–2700)

## 2017-02-01 LAB — BASIC METABOLIC PANEL
Anion gap: 6 (ref 5–15)
BUN: 15 mg/dL (ref 6–20)
CHLORIDE: 97 mmol/L — AB (ref 101–111)
CO2: 32 mmol/L (ref 22–32)
CREATININE: 0.73 mg/dL (ref 0.61–1.24)
Calcium: 8.6 mg/dL — ABNORMAL LOW (ref 8.9–10.3)
GFR calc non Af Amer: >60 mL/min (ref >60)
GLUCOSE: 164 mg/dL — AB (ref 65–99)
Potassium: 4.3 mmol/L (ref 3.5–5.1)
Sodium: 135 mmol/L (ref 135–145)

## 2017-02-01 MED ORDER — FAMOTIDINE 20 MG PO TABS
20.0000 mg | ORAL_TABLET | Freq: Two times a day (BID) | ORAL | Status: DC
  Administered 2017-02-01 – 2017-02-02 (×3): 20 mg via ORAL
  Filled 2017-02-01 (×3): qty 1

## 2017-02-01 MED ORDER — SALINE SPRAY 0.65 % NA SOLN
1.0000 | NASAL | Status: DC | PRN
  Administered 2017-02-01: 1 via NASAL
  Filled 2017-02-01: qty 44

## 2017-02-01 MED ORDER — NICOTINE 14 MG/24HR TD PT24
14.0000 mg | MEDICATED_PATCH | Freq: Every day | TRANSDERMAL | Status: DC
  Administered 2017-02-01 – 2017-02-02 (×2): 14 mg via TRANSDERMAL
  Filled 2017-02-01 (×2): qty 1

## 2017-02-01 MED ORDER — SIMETHICONE 80 MG PO CHEW
80.0000 mg | CHEWABLE_TABLET | Freq: Four times a day (QID) | ORAL | Status: DC | PRN
  Administered 2017-02-01: 80 mg via ORAL
  Filled 2017-02-01: qty 1

## 2017-02-01 MED ORDER — AZITHROMYCIN 600 MG PO TABS
1200.0000 mg | ORAL_TABLET | ORAL | Status: DC
  Administered 2017-02-01: 1200 mg via ORAL
  Filled 2017-02-01: qty 2

## 2017-02-01 MED ORDER — BOOST PLUS PO LIQD
237.0000 mL | Freq: Three times a day (TID) | ORAL | Status: DC
  Administered 2017-02-01 – 2017-02-02 (×2): 237 mL via ORAL
  Filled 2017-02-01 (×4): qty 237

## 2017-02-01 MED ORDER — PREDNISONE 50 MG PO TABS
60.0000 mg | ORAL_TABLET | Freq: Every day | ORAL | Status: DC
  Administered 2017-02-02: 60 mg via ORAL
  Filled 2017-02-01: qty 1

## 2017-02-01 NOTE — Progress Notes (Signed)
PROGRESS NOTE [facility]   Jeffrey Gutierrez   VFI:[id number] DOB: 26-Oct-1955  DOA: 01/31/2017 PCP: Jeffrey Sites, MD   Brief Narrative:  Jeffrey Gutierrez is a 61 y.o. male with medical history significant of stage IV adenocarcinoma of the lung (on atezolizumab every 3 weeks at [facility]), HIV on antiretroviral therapy, COPD on home O2 on a as needed basis, PBC with early cirrhosis presented to the hospital for shortness of breath. Admitted for acute hypoxemic respiratory failure due to COPD exacerbation. Patient was started on Solu-Medrol, IV antibiotics and nebulizer.  Subjective: Patient seen and examined, report improvement on his breathing but not back baseline. Patient also report that his legs a significantly better.   Assessment & Plan: Acute on chronic hypoxic respiratory failure - multifactorial, due to COPD exacerbation, lung adenocarcinoma and acute on chronic diastolic failure.  See below  COPD exacerbation Patient treated with IV Solu-Medrol transitioned to prednisone Continue nebulizer and antibiotics O2 as needed Check pulse ox with ambulation CT chest shows a lot of debris in lower airways - no signs of PCP or interstitial lung diseases   Acute on chronic diastolic failure Echo shows preserved EF with grade 1 diastolic dysfunction and mild dilated right ventricle. Continue Lasix Doppler negative for DVT  Low salt diet  Daily weight and I&Os   Esophagitis/GERD PPI switch for Pepcid 20 mg twice a day, continue Carafate  Stage IV adenocarcinoma of the lung with bone metastases Follow-up Jeffrey Gutierrez his home immunotherapy every 3 weeks Port-A-Cath in place  HIV Last CD4 count was 64 in February 2018, new CD4 count 10 Continue antiretroviral therapy We'll consult ID morning Will add azithromycin 1200 mg q weekly    DVT prophylaxis: Lovenox Code Status: Full code Family Communication: None at bedside Disposition Plan: Home when medically  stable  Consultants:   None  Procedures:   Echocardiogram ------------------------------------------------------------------- Study Conclusions  - Left ventricle: The cavity size was normal. Wall thickness was   normal. Systolic function was normal. The estimated ejection   fraction was in the range of 60% to 65%. Wall motion was normal;   there were no regional wall motion abnormalities. Doppler   parameters are consistent with abnormal left ventricular   relaxation (grade 1 diastolic dysfunction). - Aortic valve: Trileaflet; moderately calcified leaflets.   Sclerosis without stenosis. - Right ventricle: The cavity size was mildly dilated. Systolic   function was normal. - Pulmonary arteries: No complete TR doppler jet so unable to   estimate PA systolic pressure. - Inferior vena cava: The vessel was normal in size. The   respirophasic diameter changes were in the normal range (>= 50%),   consistent with normal central venous pressure. - Pericardium, extracardiac: A trivial pericardial effusion was   identified.  Impressions:  - Normal LV size with EF 60-65%. Mildly dilated RV with normal   systolic function. Aortic valve sclerosis without significant   stenosis.  Antimicrobials: Anti-infectives    Start     Dose/Rate Route Frequency Ordered Stop   02/01/17 1945  azithromycin (ZITHROMAX) tablet 1,200 mg     1,200 mg Oral Weekly 02/01/17 1932     01/31/17 1400  dolutegravir (TIVICAY) tablet 50 mg     50 mg Oral Daily 01/31/17 1043     01/31/17 1400  emtricitabine-tenofovir AF (DESCOVY) 200-25 MG per tablet 1 tablet     1 tablet Oral Daily 01/31/17 1043     01/31/17 1100  Ampicillin-Sulbactam (UNASYN) 3 g in sodium  chloride 0.9 % 100 mL IVPB     3 g 200 mL/hr over 30 Minutes Intravenous Every 6 hours 01/31/17 1048           Objective: Vitals:   01/31/17 2015 02/01/17 0534 02/01/17 0558 02/01/17 1419  BP: 113/63  118/78   Pulse: 96  84   Resp: 18  18    Temp: 98.2 F (36.8 C)  97.8 F (36.6 C)   TempSrc: Oral  Oral   SpO2: 98% 92% 93% 97%  Weight:   55.7 kg (122 lb 12.7 oz)   Height:        Intake/Output Summary (Last 24 hours) at 02/01/17 1424 Last data filed at 02/01/17 1300  Gross per 24 hour  Intake              780 ml  Output              400 ml  Net              380 ml   Filed Weights   01/31/17 0320 01/31/17 1047 02/01/17 0558  Weight: 59 kg (130 lb) 59 kg (130 lb) 55.7 kg (122 lb 12.7 oz)    Examination:  General exam: NAD, cachectic HEENT: OP moist and clear Respiratory system: Breath sounds diminished, diffuse rhonchi Cardiovascular system: S1 & S2 heard, RRR. No JVD, murmurs, rubs or gallops Gastrointestinal system: Abdomen is nondistended, soft and nontender. No organomegaly or masses felt. Normal bowel sounds heard. Central nervous system: Alert and oriented. No focal neurological deficits. Extremities: Bilateral lower extremity edema 1+ Skin: No rashes, lesions or ulcers Psychiatry: Judgement and insight appear normal. Mood & affect very flat affect   Data Reviewed: I have personally reviewed following labs and imaging studies  CBC:  Recent Labs Lab 01/31/17 0352 01/31/17 1102 02/01/17 0524  WBC 3.6* 2.9* 7.1  NEUTROABS 2.9  --   --   HGB 11.1* 11.8* 11.7*  HCT 33.5* 36.8* 36.6*  MCV 95.7 96.8 95.8  PLT 105* 120* 546*   Basic Metabolic Panel:  Recent Labs Lab 01/31/17 0352 01/31/17 1102 02/01/17 0524  NA 135  --  135  K 3.7  --  4.3  CL 98*  --  97*  CO2 31  --  32  GLUCOSE 124*  --  164*  BUN 11  --  15  CREATININE 0.65 0.89 0.73  CALCIUM 8.4*  --  8.6*   GFR: Estimated Creatinine Clearance: 79.3 mL/min (by C-G formula based on SCr of 0.73 mg/dL). Liver Function Tests:  Recent Labs Lab 01/31/17 0352  AST 22  ALT 11*  ALKPHOS 112  BILITOT 0.7  PROT 6.6  ALBUMIN 2.7*   Anemia Panel:  Recent Labs  01/31/17 1102  VITAMINB12 758   Radiology Studies: Ct Angio Chest Pe  W/cm &/or Wo Cm  Result Date: 01/31/2017 CLINICAL DATA:  Shortness of breath and hypoxia. Lung cancer with bone metastases. Undergoing chemotherapy. EXAM: CT ANGIOGRAPHY CHEST WITH CONTRAST TECHNIQUE: Multidetector CT imaging of the chest was performed using the standard protocol during bolus administration of intravenous contrast. Multiplanar CT image reconstructions and MIPs were obtained to evaluate the vascular anatomy. CONTRAST:  80 cc Isovue 370 intravenous COMPARISON:  06/25/2016 FINDINGS: Cardiovascular: Satisfactory opacification of the pulmonary arteries to the segmental level. When allowing for intermittent motion, no evidence of pulmonary embolism. Normal heart size. No pericardial effusion. Mediastinum/Nodes: Diffuse circumferential thickening of the esophagus with prominent mucosal enhancement, also seen previously. No adenopathy.  Lungs/Pleura: Bullous emphysema. Small right pleural effusion which is new. History of pleural-based lung cancer spread on the right with small areas of pleural thickening. Extensive airway debris in the bilateral lower lobes New, from local comparisons, is 9 mm nodule in the left upper lobe, 11:48. New 9 mm right upper lobe nodule on 11:55. Stable subpleural nodule along the lower right major fissure, 11:69. Patient's most recent staging scan was PET/CT 12/05/2016 at outside hospital. Upper Abdomen: No acute or aggressive finding. Musculoskeletal: Known osseous metastatic disease, most notably confluent sclerosis in the T5 vertebral body. There are multiple erosive rib lesions on the right. Some of these could be pleural based. Review of the MIP images confirms the above findings. IMPRESSION: 1. Negative for pulmonary embolism. 2. Extensive lower lobe airway debris and chronic changes of esophagitis, question aspiration. 3. History of lung cancer on the right with pleural and bony metastases. Outside staging PET-CT 12/05/2016. 4. Small right pleural effusion and mild right  lower lobe atelectasis. Electronically Signed   By: Monte Fantasia M.D.   On: 01/31/2017 09:35   Dg Chest Port 1 View  Result Date: 01/31/2017 CLINICAL DATA:  Sudden onset of shortness of breath this morning. Cough. Lung cancer. EXAM: PORTABLE CHEST 1 VIEW COMPARISON:  Radiographs and CT 06/25/2016 FINDINGS: Tip of the right chest port in the mid SVC. Advanced emphysema including bullous changes at the lung apices. No evidence of pneumothorax. Unchanged heart size and mediastinal contours. No confluent airspace disease, pulmonary edema or large pleural effusion. Peri-fissural nodules on prior CT not well seen. Lower lobe bronchial thickening is again suspected. Known osseous metastatic disease in the thoracic spine is not well characterized radiographically. IMPRESSION: Advanced emphysema with bullous changes at the apices. Lower lobe bronchial thickening. No radiographic evidence of acute abnormality. Electronically Signed   By: Jeb Levering M.D.   On: 01/31/2017 04:37    Scheduled Meds: . calcium-vitamin D  1 tablet Oral QAC breakfast  . diltiazem  30 mg Oral Q8H  . dolutegravir  50 mg Oral Daily  . DULoxetine  30 mg Oral Daily  . emtricitabine-tenofovir AF  1 tablet Oral Daily  . enoxaparin (LOVENOX) injection  40 mg Subcutaneous Q24H  . famotidine  20 mg Oral BID  . folic acid  1 mg Oral Daily  . furosemide  40 mg Oral Daily  . levalbuterol  0.63 mg Nebulization TID   And  . ipratropium  0.5 mg Nebulization TID  . lactose free nutrition  237 mL Oral BID BM  . mometasone-formoterol  2 puff Inhalation BID  . morphine  15 mg Oral Q12H  . multivitamin with minerals  1 tablet Oral Daily  . polyethylene glycol  34 g Oral BID  . potassium chloride  20 mEq Oral Daily  . [START ON 02/02/2017] predniSONE  60 mg Oral Q breakfast  . sodium chloride flush  3 mL Intravenous Q12H  . sucralfate  1 g Oral QID   Continuous Infusions: . sodium chloride    . ampicillin-sulbactam (UNASYN) IV 3 g  (02/01/17 1256)     LOS: 1 day    Chipper Oman, MD Pager: Text Page via [email or web address]  [phone]  If 7PM-7AM, please contact night-coverage [email or web address] Password [facility] 02/01/2017, 2:24 PM

## 2017-02-01 NOTE — Telephone Encounter (Signed)
Pt returned paperwork and it has been faxed to the pt assistance co.

## 2017-02-01 NOTE — Progress Notes (Signed)
Initial Nutrition Assessment  DOCUMENTATION CODES:   Severe malnutrition in context of chronic illness, Underweight  INTERVENTION:   -Continue Boost Plus chocolate TID- Each supplement provides 360kcal and 14g protein.   -Provide Magic cup TID with meals, each supplement provides 290 kcal and 9 grams of protein  -Encourage PO intake -RD to continue to monitor  NUTRITION DIAGNOSIS:   Malnutrition (Severe) related to chronic illness, cancer and cancer related treatments as evidenced by percent weight loss, severe depletion of body fat, severe depletion of muscle mass.  GOAL:   Patient will meet greater than or equal to 90% of their needs  MONITOR:   PO intake, Supplement acceptance, Labs, Weight trends, I & O's  REASON FOR ASSESSMENT:   Consult Assessment of nutrition requirement/status  ASSESSMENT:   61 y.o. male with medical history significant of stage IV adenocarcinoma of the lung (on atezolizumab every 3 weeks at [facility]), HIV on antiretroviral therapy, COPD on home O2 on a as needed basis, PBC with early cirrhosis presented to the hospital for shortness of breath that started yesterday evening. Per patient, over the past few days he has had slight worsening of his usual "cough"-which much more productive yellowish phlegm than usual. He still occasionally smokes. Last evening he started slowly developing significant worsening of his shortness of breath, as a result EMS was called-per patient he was wheezing and was not getting better with his usual bronchodilator treatments at home. He was given Solu-Medrol and albuterol while in route to the emergency room, he was provided with supportive care while in the emergency room-although he felt better than in his initial presentation-he still does not feel that he is back to his usual baseline. The emergency room M.D. tried to see if he could ambulate and be discharged home-however he developed shortness of breath with minimal  ambulation, as a result the [facility] service was asked to admit this patient for further evaluation and treatment.  Patient in room with no family at bedside. Pt reports eating "okay". Per chart, pt consumed 80% of breakfast of eggs and sausage and 75% of spaghetti and a salad. Pt likes Boost supplements and has been drinking those consistently. Pt reports having difficulty swallowing "some days", no issues chewing his food and sometimes having bland or bitter tastes with certain foods.   Per chart review, pt has lost 16 lbs since 2/22 (12% wt loss x 3 months, significant for time frame). Nutrition-Focused physical exam completed. Findings are severe fat depletion, severe muscle depletion, and mild edema.   Labs reviewed. Medications: OSCAL-D tablet daily, Folic Acid tablet daily, Lasix tablet daily, Multivitamin with minerals daily, K-DUR tablet daily, Miralax packet BID, Carafate tablet QID  Diet Order:  Diet Heart Room service appropriate? Yes; Fluid consistency: Thin  Skin:  Reviewed, no issues  Last BM:  5/16  Height:   Ht Readings from Last 1 Encounters:  01/31/17 '6\' 1"'$  (1.854 m)    Weight:   Wt Readings from Last 1 Encounters:  02/01/17 122 lb 12.7 oz (55.7 kg)    Ideal Body Weight:  83.6 kg  BMI:  Body mass index is 16.2 kg/m.  Estimated Nutritional Needs:   Kcal:  1950-2150  Protein:  100-110g  Fluid:  2.1L/day  EDUCATION NEEDS:   No education needs identified at this time  Clayton Bibles, MS, RD, LDN Pager: [phone] After Hours Pager: [phone]

## 2017-02-02 DIAGNOSIS — I5033 Acute on chronic diastolic (congestive) heart failure: Secondary | ICD-10-CM

## 2017-02-02 DIAGNOSIS — B20 Human immunodeficiency virus [HIV] disease: Secondary | ICD-10-CM

## 2017-02-02 DIAGNOSIS — K209 Esophagitis, unspecified: Secondary | ICD-10-CM

## 2017-02-02 LAB — BASIC METABOLIC PANEL
Anion gap: 7 (ref 5–15)
BUN: 17 mg/dL (ref 6–20)
CALCIUM: 8.6 mg/dL — AB (ref 8.9–10.3)
CO2: 32 mmol/L (ref 22–32)
CREATININE: 0.67 mg/dL (ref 0.61–1.24)
Chloride: 95 mmol/L — ABNORMAL LOW (ref 101–111)
GFR calc non Af Amer: >60 mL/min (ref >60)
GLUCOSE: 119 mg/dL — AB (ref 65–99)
Potassium: 4.6 mmol/L (ref 3.5–5.1)
Sodium: 134 mmol/L — ABNORMAL LOW (ref 135–145)

## 2017-02-02 LAB — CBC
HCT: 35.8 % — ABNORMAL LOW (ref 39.0–52.0)
Hemoglobin: 11.3 g/dL — ABNORMAL LOW (ref 13.0–17.0)
MCH: 31.2 pg (ref 26.0–34.0)
MCHC: 32.1 g/dL (ref 30.0–36.0)
MCV: 97 fL (ref 78.0–100.0)
PLATELETS: 124 10*3/uL — AB (ref 150–400)
RBC: 3.69 MIL/uL — ABNORMAL LOW (ref 4.22–5.81)
RDW: 14.6 % (ref 11.5–15.5)
WBC: 5.1 10*3/uL (ref 4.0–10.5)

## 2017-02-02 MED ORDER — SULFAMETHOXAZOLE-TRIMETHOPRIM 800-160 MG PO TABS: 1.0000 | ORAL_TABLET | Freq: Every day | ORAL | Status: DC

## 2017-02-02 MED ORDER — AMOXICILLIN-POT CLAVULANATE 875-125 MG PO TABS: 1.0000 | ORAL_TABLET | Freq: Two times a day (BID) | ORAL | 0 refills | Status: AC

## 2017-02-02 MED ORDER — PREDNISONE 10 MG PO TABS: ORAL_TABLET | ORAL | 0 refills | Status: DC

## 2017-02-02 MED ORDER — FUROSEMIDE 20 MG PO TABS: 20.0000 mg | ORAL_TABLET | Freq: Two times a day (BID) | ORAL | 0 refills | Status: AC

## 2017-02-02 MED ORDER — DILTIAZEM HCL 30 MG PO TABS: 30.0000 mg | ORAL_TABLET | Freq: Three times a day (TID) | ORAL | 0 refills | Status: DC

## 2017-02-02 MED ORDER — FAMOTIDINE 20 MG PO TABS: 20.0000 mg | ORAL_TABLET | Freq: Two times a day (BID) | ORAL | 0 refills | Status: DC

## 2017-02-02 MED ORDER — AMOXICILLIN-POT CLAVULANATE 875-125 MG PO TABS
1.0000 | ORAL_TABLET | Freq: Two times a day (BID) | ORAL | Status: DC
  Administered 2017-02-02: 1 via ORAL
  Filled 2017-02-02: qty 1

## 2017-02-02 NOTE — Discharge Summary (Signed)
Physician Discharge Summary  Jeffrey Gutierrez  ASN:[id number]  DOB: 1956-07-01  DOA: 01/31/2017 PCP: Sharilyn Sites, MD  Admit date: 01/31/2017 Discharge date: 02/02/2017  Admitted From: Home Disposition:  Home   Recommendations for Outpatient Follow-up:  1. Follow up with PCP in 1-2 weeks 2. Please obtain BMP/CBC in one week 3. Follow up with ID Dr Megan Salon in 2 weeks to repeat CD 4 count  4. Please follow up HIV viral load results   Home Health: None Equipment/Devices: O2 2L [place]    Discharge Condition: Stable  CODE STATUS: FULL  Diet recommendation: Heart Healthy / Carb Modified   Brief/Interim Summary: Jeffrey Gutierrez a 61 y.o.malewith medical history significant of stage IV adenocarcinoma of the lung (on at[facility]olizumab every 3 weeksat [facility]), HIV on antiretroviral therapy, COPD on home O2 on a as needed basis, PBC with early cirrhosis presented to the hospital for shortness of breath. Admitted for acute hypoxemic respiratory failure due to COPD exacerbation. Patient was started on Solu-Medrol, IV antibiotics and nebulizer.  Subjective: Patient seen and examined, report significant improvement in breathing and now back to baseline. Patient also report legs continued to improve. Denies chest pain, shortness of breath, palpitations and dizziness. Patient remains afebrile and ambulating with PT.  Discharge Diagnoses/Hospital Course:  Acute on chronic hypoxic respiratory failure - multifactorial, due to COPD exacerbation, lung adenocarcinoma and acute on chronic diastolic failure.  See below  COPD exacerbation - Improved  Patient treated with IV Solu-Medrol transitioned to prednisone will continue taper for 14 days  Continue nebulizer  Initially treated with Unasyn will switch to Augmentin for 7 days  Continuous O2 supplementation 2L   CT chest shows a lot of debris in lower airways - no signs of PCP or interstitial lung diseases   Acute on chronic diastolic failure  - Improved  Echo shows preserved EF with grade 1 diastolic dysfunction and mild dilated right ventricle. Initially treated with IV lasix transitioned to PO - will continue 20 mg BID upon discharge  Doppler negative for DVT  Low salt diet  Daily weight  Follow up with PCP   HIV Last CD4 count was 64 in February 2018, new CD4 count 10 Continue antiretroviral therapy Case discussed with ID - give patient is taking his medications as indicated, this drop is likely due to steroid effects. Will d/c MAC prophylaxis. Patient to follow up with ID as outpatient after steroid treatment to repeat CD 4 count  Viral load pending  No need for prophylaxis medications at this time.  Atrial Tachycardia Started on Cardizem, HR well controlled, will continue this medication for now. Will differ to PCP if need to be continued on this medication.   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  Patient was seen by physical therapy, with no further recommendations  On the day of the discharge the patient's vitals were stable, and no other acute medical condition were reported by patient. Patient was felt safe to be discharge to home   Discharge Instructions  You were cared for by a hospitalist during your hospital stay. If you have any questions about your discharge medications or the care you received while you were in the hospital after you are discharged,  you can call the unit and asked to speak with the hospitalist on call if the hospitalist that took care of you is not available. Once you are discharged, your primary care physician will handle any further medical issues. Please note that NO REFILLS for any discharge  medications will be authorized once you are discharged, as it is imperative that you return to your primary care physician (or establish a relationship with a primary care physician if you do not have one) for your aftercare needs so that they can reassess your need for medications and monitor your lab values.  Discharge Instructions    (HEART FAILURE PATIENTS) Call MD:  Anytime you have any of the following symptoms: 1) 3 pound weight gain in 24 hours or 5 pounds in 1 week 2) shortness of breath, with or without a dry hacking cough 3) swelling in the hands, feet or stomach 4) if you have to sleep on extra pillows at night in order to breathe.    Complete by:  As directed    Call MD for:  difficulty breathing, headache or visual disturbances    Complete by:  As directed    Call MD for:  extreme fatigue    Complete by:  As directed    Call MD for:  hives    Complete by:  As directed    Call MD for:  persistant dizziness or light-headedness    Complete by:  As directed    Call MD for:  persistant nausea and vomiting    Complete by:  As directed    Call MD for:  redness, tenderness, or signs of infection (pain, swelling, redness, odor or green/yellow discharge around incision site)    Complete by:  As directed    Call MD for:  severe uncontrolled pain    Complete by:  As directed    Call MD for:  temperature >100.4    Complete by:  As directed    Diet - low sodium heart healthy    Complete by:  As directed    Increase activity slowly    Complete by:  As directed      Allergies as of 02/02/2017      Reactions   Dihydrocodeine Anaphylaxis   Hydrocodone Anaphylaxis, Other (See Comments)   Throat closes up Patient states PCP gave 11/2015 and he has been tolerating it.   Doxycycline Nausea And Vomiting   Linzess [linaclotide] Diarrhea      Medication List    STOP taking these medications   ibuprofen 200 MG tablet Commonly known as:  ADVIL,MOTRIN   omeprazole 20 MG capsule Commonly  known as:  PRILOSEC     TAKE these medications   albuterol 108 (90 Base) MCG/ACT inhaler Commonly known as:  PROVENTIL HFA;VENTOLIN HFA Inhale 2 puffs into the lungs every 4 (four) hours as needed for wheezing or shortness of breath.   amoxicillin-clavulanate 875-125 MG tablet Commonly known as:  AUGMENTIN Take 1 tablet by mouth every 12 (twelve) hours.   budesonide-formoterol 160-4.5 MCG/ACT inhaler Commonly known as:  SYMBICORT Inhale 2 puffs into the lungs 2 (two) times daily.   CALCIUM-VITAMIN D PO Take 1 tablet by mouth daily.   diltiazem 30 MG tablet Commonly known as:  CARDIZEM Take 1 tablet (30 mg total) by mouth every 8 (eight) hours.   dolutegravir 50 MG tablet Commonly known as:  TIVICAY Take 1 tablet (50 mg total) by mouth daily.   DULoxetine 30 MG capsule Commonly known as:  CYMBALTA Take 30 mg by mouth every morning.   emtricitabine-tenofovir AF 200-25 MG tablet Commonly known as:  DESCOVY Take 1 tablet by mouth daily.   famotidine 20 MG tablet Commonly known as:  PEPCID Take 1 tablet (20 mg total) by mouth 2 (two) times daily.   feeding supplement Liqd Take 1 Container by mouth 2 (two) times daily between meals.   folic acid 1 MG tablet Commonly known as:  FOLVITE Take 1 mg by mouth every morning.   furosemide 20 MG tablet Commonly known as:  LASIX Take 1 tablet (20 mg total) by mouth 2 (two) times daily. 40 mg in the morning and 20 mg at bedtime. What changed:  when to take this  reasons to take this   Melatonin 3 MG Tabs Take 3 mg by mouth at bedtime.   morphine 15 MG 12 hr tablet Commonly known as:  MS CONTIN Take 1 tablet (15 mg total) by mouth every 12 (twelve) hours.   morphine 15 MG tablet Commonly known as:  MSIR Take 1 tablet (15 mg total) by mouth every 4 (four) hours as needed for severe pain.   multivitamin with minerals Tabs tablet Take 1 tablet by mouth every morning.   OCALIVA 5 MG Tabs Generic drug:  Obeticholic  Acid Take 1 tablet by mouth daily. What changed:  how much to take  when to take this   polyethylene glycol packet Commonly known as:  MIRALAX / GLYCOLAX Take 34 g by mouth 2 (two) times daily.   predniSONE 10 MG tablet Commonly known as:  DELTASONE Take 4 tablets for 3 days; Take 3 tablets for 4 days; Take 2 tablets for 3 days; Take 1 tablet for 4 days   sucralfate 1 g tablet Commonly known as:  CARAFATE TAKE 1 TABLET BY MOUTH 4 TIMES A DAY WITH MEALS AND AT BEDTIME   tiotropium 18 MCG inhalation capsule Commonly known as:  SPIRIVA Place 18 mcg into inhaler and inhale every morning.      Follow-up Information    Michel Bickers, MD. Schedule an appointment as soon as possible for a visit in 2 week(s).   Specialty:  Infectious Diseases Why:  Follow up  Contact information: [address] [place] [address] [phone]        Sharilyn Sites, MD. Schedule an appointment as soon as possible for a visit in 1 week(s).   Specialty:  Family Medicine Why:  Follow up hospital stay  Contact information: [address] [place] [address] [phone]          Allergies  Allergen Reactions  . Dihydrocodeine Anaphylaxis  . Hydrocodone Anaphylaxis and Other (See Comments)    Throat closes up  Patient states PCP gave 11/2015 and he has been tolerating it.  . Doxycycline Nausea And Vomiting  . Linzess [Linaclotide] Diarrhea    Consultations:  None    Procedures/Studies: Ct Angio Chest Pe W/cm &/or Wo Cm  Result Date: 01/31/2017 CLINICAL DATA:  Shortness of breath and hypoxia. Lung cancer with bone metastases. Undergoing chemotherapy. EXAM: CT ANGIOGRAPHY CHEST WITH CONTRAST TECHNIQUE: Multidetector CT imaging of the chest was performed using the standard protocol during bolus administration of intravenous contrast. Multiplanar CT image reconstructions and MIPs were obtained to evaluate the vascular anatomy. CONTRAST:  80 cc Isovue 370  intravenous COMPARISON:  06/25/2016 FINDINGS: Cardiovascular: Satisfactory opacification of the pulmonary arteries to the segmental level. When allowing for intermittent motion, no evidence of pulmonary embolism. Normal heart  size. No pericardial effusion. Mediastinum/Nodes: Diffuse circumferential thickening of the esophagus with prominent mucosal enhancement, also seen previously. No adenopathy. Lungs/Pleura: Bullous emphysema. Small right pleural effusion which is new. History of pleural-based lung cancer spread on the right with small areas of pleural thickening. Extensive airway debris in the bilateral lower lobes New, from local comparisons, is 9 mm nodule in the left upper lobe, 11:48. New 9 mm right upper lobe nodule on 11:55. Stable subpleural nodule along the lower right major fissure, 11:69. Patient's most recent staging scan was PET/CT 12/05/2016 at outside hospital. Upper Abdomen: No acute or aggressive finding. Musculoskeletal: Known osseous metastatic disease, most notably confluent sclerosis in the T5 vertebral body. There are multiple erosive rib lesions on the right. Some of these could be pleural based. Review of the MIP images confirms the above findings. IMPRESSION: 1. Negative for pulmonary embolism. 2. Extensive lower lobe airway debris and chronic changes of esophagitis, question aspiration. 3. History of lung cancer on the right with pleural and bony metastases. Outside staging PET-CT 12/05/2016. 4. Small right pleural effusion and mild right lower lobe atelectasis. Electronically Signed   By: Monte Fantasia M.D.   On: 01/31/2017 09:35   Dg Chest Port 1 View  Result Date: 01/31/2017 CLINICAL DATA:  Sudden onset of shortness of breath this morning. Cough. Lung cancer. EXAM: PORTABLE CHEST 1 VIEW COMPARISON:  Radiographs and CT 06/25/2016 FINDINGS: Tip of the right chest port in the mid SVC. Advanced emphysema including bullous changes at the lung apices. No evidence of pneumothorax.  Unchanged heart size and mediastinal contours. No confluent airspace disease, pulmonary edema or large pleural effusion. Peri-fissural nodules on prior CT not well seen. Lower lobe bronchial thickening is again suspected. Known osseous metastatic disease in the thoracic spine is not well characterized radiographically. IMPRESSION: Advanced emphysema with bullous changes at the apices. Lower lobe bronchial thickening. No radiographic evidence of acute abnormality. Electronically Signed   By: Jeb Levering M.D.   On: 01/31/2017 04:37    ECHO 01/31/17 ------------------------------------------------------------------- Study Conclusions  - Left ventricle: The cavity size was normal. Wall thickness was   normal. Systolic function was normal. The estimated ejection   fraction was in the range of 60% to 65%. Wall motion was normal;   there were no regional wall motion abnormalities. Doppler   parameters are consistent with abnormal left ventricular   relaxation (grade 1 diastolic dysfunction). - Aortic valve: Trileaflet; moderately calcified leaflets.   Sclerosis without stenosis. - Right ventricle: The cavity size was mildly dilated. Systolic   function was normal. - Pulmonary arteries: No complete TR doppler jet so unable to   estimate PA systolic pressure. - Inferior vena cava: The vessel was normal in size. The   respirophasic diameter changes were in the normal range (>= 50%),   consistent with normal central venous pressure. - Pericardium, extracardiac: A trivial pericardial effusion was   identified.  Impressions:  - Normal LV size with EF 60-65%. Mildly dilated RV with normal   systolic function. Aortic valve sclerosis without significant   stenosis.  Discharge Exam: Vitals:   02/01/17 2021 02/02/17 0435  BP: 113/73 121/84  Pulse: 84 79  Resp: 20 18  Temp: 98.3 F (36.8 C) 97.5 F (36.4 C)   Vitals:   02/02/17 0435 02/02/17 0825 02/02/17 0830 02/02/17 1352  BP: 121/84      Pulse: 79     Resp: 18     Temp: 97.5 F (36.4 C)  TempSrc: Oral     SpO2: 96% 94% 94% (!) 89%  Weight: 56.7 kg (125 lb)     Height:        General: Pt is alert, awake, not in acute distress, Cachetic  Cardiovascular: RRR, S1/S2 +, no rubs, no gallops, On O2 [place]  Respiratory: Good air entry, mild force expiratory wheezing, no crackles  Abdominal: Soft, NT, ND, bowel sounds + Extremities: no edema, no cyanosis  The results of significant diagnostics from this hospitalization (including imaging, microbiology, ancillary and laboratory) are listed below for reference.     Microbiology: No results found for this or any previous visit (from the past 240 hour(s)).   Labs: BNP (last 3 results)  Recent Labs  04/27/16 2020 01/31/17 1102  BNP 67.0 037.5*   Basic Metabolic Panel:  Recent Labs Lab 01/31/17 0352 01/31/17 1102 02/01/17 0524 02/02/17 0602  NA 135  --  135 134*  K 3.7  --  4.3 4.6  CL 98*  --  97* 95*  CO2 31  --  32 32  GLUCOSE 124*  --  164* 119*  BUN 11  --  15 17  CREATININE 0.65 0.89 0.73 0.67  CALCIUM 8.4*  --  8.6* 8.6*   Liver Function Tests:  Recent Labs Lab 01/31/17 0352  AST 22  ALT 11*  ALKPHOS 112  BILITOT 0.7  PROT 6.6  ALBUMIN 2.7*   No results for input(s): LIPASE, AMYLASE in the last 168 hours. No results for input(s): AMMONIA in the last 168 hours. CBC:  Recent Labs Lab 01/31/17 0352 01/31/17 1102 02/01/17 0524 02/02/17 0602  WBC 3.6* 2.9* 7.1 5.1  NEUTROABS 2.9  --   --   --   HGB 11.1* 11.8* 11.7* 11.3*  HCT 33.5* 36.8*  36.7* 36.6* 35.8*  MCV 95.7 96.8 95.8 97.0  PLT 105* 120* 121* 124*   Anemia work up  Recent Labs  01/31/17 1102  VITAMINB12 758   Urinalysis    Component Value Date/Time   COLORURINE AMBER (A) 04/27/2016 2344   APPEARANCEUR HAZY (A) 04/27/2016 2344   LABSPEC 1.010 04/27/2016 2344   PHURINE 7.0 04/27/2016 [place] 04/27/2016 2344   [place] NEGATIVE 04/27/2016 2344    [place] NEGATIVE 04/27/2016 2344   [place] 04/27/2016 2344   PROTEINUR NEGATIVE 04/27/2016 2344   NITRITE NEGATIVE 04/27/2016 2344   LEUKOCYTESUR NEGATIVE 04/27/2016 2344   Sepsis Labs Invalid input(s): PROCALCITONIN,  WBC,  LACTICIDVEN Microbiology No results found for this or any previous visit (from the past 240 hour(s)).   Time coordinating discharge: 35 minutes  SIGNED:  Chipper Oman, MD  [facility] 02/02/2017, 1:58 PM  Pager please text page via  [email or web address] Password [email or web address]

## 2017-02-02 NOTE — Progress Notes (Signed)
Pt wean down from 3L to 2L of O2 via [place] and sat stayed 92-93% on rest. Pt instructed to use O2 at all times instead of prn PTA.

## 2017-02-02 NOTE — Progress Notes (Signed)
Physical Therapy Treatment Patient Details Name: Jeffrey Gutierrez MRN: [id number] DOB: [birth year] Today's Date: 02/02/2017    History of Present Illness 61 yo male admitted with COPD exac. Hx of stage IV lung cancer with mets, COPD, HIV, HTN.     PT Comments    Assisted OOB to amb a great distance in hallway with no AD however did require 2 lts nasal to achieve 90%.  SATURATION QUALIFICATIONS: (This note is used to comply with regulatory documentation for home oxygen)  Patient Saturations on Room Air at Rest = 89%  Patient Saturations on Room Air while Ambulating = 81%  Patient Saturations on 2 Liters of oxygen while Ambulating = 90%  Please briefly explain why patient needs home oxygen:  Pt required supplemental oxygen to achieve therapeutic level   Follow Up Recommendations  Supervision for mobility/OOB     Equipment Recommendations  None recommended by PT    Recommendations for Other Services       Precautions / Restrictions Precautions Precautions: Fall Precaution Comments: monitor O2 sats Restrictions Weight Bearing Restrictions: No    Mobility  Bed Mobility Overal bed mobility: Modified Independent                Transfers Overall transfer level: Needs assistance Equipment used: None Transfers: Sit to/from [employer] Transfers Sit to Stand: Supervision Stand pivot transfers: Supervision       General transfer comment: good safety cognition and use of hands to steady self  Ambulation/Gait Ambulation/Gait assistance: Supervision;Min guard Ambulation Distance (Feet): 278 Feet Assistive device: None Gait Pattern/deviations: Step-through pattern Gait velocity: WFL   General Gait Details: good alternating gait but noted dyspnea.  RA decreased to 81% and required 2 lts nasal to achieve 90%.    Stairs            Wheelchair Mobility    Modified Rankin (Stroke Patients Only)       Balance                                            Cognition Arousal/Alertness: Awake/alert Behavior During Therapy: WFL for tasks assessed/performed Overall Cognitive Status: Within Functional Limits for tasks assessed                                        Exercises      General Comments        Pertinent Vitals/Pain Pain Assessment: No/denies pain    Home Living                      Prior Function            PT Goals (current goals can now be found in the care plan section) Progress towards PT goals: Progressing toward goals    Frequency    Min 3X/week      PT Plan Current plan remains appropriate    Co-evaluation              AM-PAC PT "6 Clicks" Daily Activity  Outcome Measure  Difficulty turning over in bed (including adjusting bedclothes, sheets and blankets)?: None Difficulty moving from lying on back to sitting on the side of the bed? : None Difficulty sitting down on and standing up from a chair with arms (e.g.,  wheelchair, bedside commode, etc,.)?: A Little Help needed moving to and from a bed to chair (including a wheelchair)?: A Little Help needed walking in hospital room?: A Little Help needed climbing 3-5 steps with a railing? : A Little 6 Click Score: 20    End of Session Equipment Utilized During Treatment: Gait belt;Oxygen Activity Tolerance: Patient tolerated treatment well Patient left: in chair;with call bell/phone within reach   PT Visit Diagnosis: Muscle weakness (generalized) (M62.81);Difficulty in walking, not elsewhere classified (R26.2)     Time: 0312-8118 PT Time Calculation (min) (ACUTE ONLY): 25 min  Charges:  $Gait Training: 8-22 mins $Therapeutic Activity: 8-22 mins                    G Codes:       Rica Koyanagi  PTA [facility]  Acute  Rehab Pager      [phone]

## 2017-02-02 NOTE — Progress Notes (Signed)
Physical Therapy   SATURATION QUALIFICATIONS: (This note is used to comply with regulatory documentation for home oxygen)  Patient Saturations on Room Air at Rest = 89%  Patient Saturations on Room Air while Ambulating = 81%  Patient Saturations on 2 Liters of oxygen while Ambulating = 90%  Please briefly explain why patient needs home oxygen:  Pt required supplemental oxygen to achieve therapeutic level  Rica Koyanagi  PTA [facility]  Acute  Rehab Pager      [phone]

## 2017-02-03 LAB — HIV-1 RNA QUANT-NO REFLEX-BLD
HIV 1 RNA Quant: <20 copies/mL
LOG10 HIV-1 RNA: UNDETERMINED log10copy/mL

## 2017-02-13 ENCOUNTER — Inpatient Hospital Stay: Admission: RE | Admit: 2017-02-13 | Payer: Self-pay | Source: Ambulatory Visit | Admitting: Urology

## 2017-02-13 ENCOUNTER — Telehealth: Payer: Self-pay | Admitting: *Deleted

## 2017-02-13 NOTE — Telephone Encounter (Signed)
On 02-13-17 fax medical records to [facility] , it was consult note, end of tx note, sim & planning note, follow up notes,

## 2017-02-14 ENCOUNTER — Telehealth: Payer: Self-pay

## 2017-02-14 NOTE — Telephone Encounter (Signed)
Unfortunately, I do not have labs all taken within the same time frame. Most recent CMP reviewed but INR several months ago. Using the values we have, it is Child Pugh class B, but this is not an accurate representation. Can he get CMP, INR done? Patient assistance? Will they go ahead and send this out in the meantime?

## 2017-02-14 NOTE — Telephone Encounter (Signed)
Seth Bake from [employer] patient assistance called. She said the pt is qualified to get [facility] for free through their program but they need to know what his childs pugh score is. She said the dosage he takes depends on what that score is. She said they cannot send out the medication without knowing this. When I told her that the patient may have to go for more blood work she said if we can get a score from within the past year they would take it.   Her direct number is [phone]

## 2017-02-15 ENCOUNTER — Ambulatory Visit: Admit: 2017-02-15 | Payer: 59 | Admitting: Urology

## 2017-02-15 ENCOUNTER — Telehealth: Payer: Self-pay | Admitting: Internal Medicine

## 2017-02-15 NOTE — Telephone Encounter (Signed)
I received a phone call yesterday from Dr. Ralph Leyden informing me that Jeffrey Gutierrez is hospitalized at [facility] for what is presumed to be a COPD exacerbation. Unfortunately Jeffrey Gutierrez lost his insurance and is currently off Descovy. He continues to take Tivicay. It sounds like he has been off Descovy for about 3 weeks. He also believes someone told him to stop taking trimethoprim sulfamethoxazole although he supposedly was on it as of his last visit with me in February. They have restarted Descovy and will be checking CD4 and HIV viral load. I suggested considering pneumocystis pneumonia if he does not respond to therapy for an acute exacerbation of COPD. He needs a follow-up visit here after discharge there.

## 2017-02-15 NOTE — Telephone Encounter (Signed)
Tried to call pt- NA- LMOM 

## 2017-02-21 ENCOUNTER — Other Ambulatory Visit: Payer: Self-pay

## 2017-02-21 ENCOUNTER — Other Ambulatory Visit: Payer: Self-pay | Admitting: Gastroenterology

## 2017-02-21 DIAGNOSIS — K743 Primary biliary cirrhosis: Secondary | ICD-10-CM

## 2017-02-21 NOTE — Telephone Encounter (Signed)
Lab orders done. 

## 2017-02-21 NOTE — Telephone Encounter (Signed)
Pt came by the office, I explained everything to him and he said he understood. I will put the lab orders in and he will go have them done. He is aware that we will fax this to the patient assistance company once Vicente Males figures the child pugh score so he can get his medication. He is aware that the only reason we have to do this bloodwork is to get this score so he can get the correct dosage of his medication.

## 2017-03-01 LAB — COMPLETE METABOLIC PANEL WITH GFR
ALT: 19 U/L (ref 9–46)
AST: 15 U/L (ref 10–35)
Albumin: 3.3 g/dL — ABNORMAL LOW (ref 3.6–5.1)
Alkaline Phosphatase: 87 U/L (ref 40–115)
BILIRUBIN TOTAL: 0.3 mg/dL (ref 0.2–1.2)
BUN: 20 mg/dL (ref 7–25)
CHLORIDE: 96 mmol/L — AB (ref 98–110)
CO2: 36 mmol/L — AB (ref 20–31)
Calcium: 8.5 mg/dL — ABNORMAL LOW (ref 8.6–10.3)
Creat: 0.81 mg/dL (ref 0.70–1.33)
GFR, Est African American: >89 mL/min (ref >=60)
Glucose, Bld: 180 mg/dL — ABNORMAL HIGH (ref 65–99)
POTASSIUM: 4.2 mmol/L (ref 3.5–5.3)
SODIUM: 139 mmol/L (ref 135–146)
Total Protein: 5.8 g/dL — ABNORMAL LOW (ref 6.1–8.1)

## 2017-03-01 LAB — PROTIME-INR
INR: 1
Prothrombin Time: 10.4 s (ref 9.0–11.5)

## 2017-03-05 NOTE — Telephone Encounter (Signed)
Reviewed labs. LFTs stable. Child Pugh Class A.

## 2017-03-05 NOTE — Telephone Encounter (Signed)
Tried to call Jeffrey Gutierrez with [employer]. NA- LMOM with results, asked her to call me back if she needed anything else or if she needed something faxed to her with this information.

## 2017-03-06 NOTE — Telephone Encounter (Signed)
Tried to call Seth Bake again, [facility] with information.

## 2017-03-07 NOTE — Telephone Encounter (Signed)
I have updated the child pugh score on the application and I have re-faxed it to [place] at [employer].

## 2017-03-08 ENCOUNTER — Ambulatory Visit: Payer: 59 | Admitting: Internal Medicine

## 2017-03-15 ENCOUNTER — Ambulatory Visit: Payer: 59 | Admitting: Internal Medicine

## 2017-04-26 ENCOUNTER — Telehealth: Payer: Self-pay

## 2017-04-26 NOTE — Telephone Encounter (Signed)
Received a request for refill on Omeprazole 20 mg daily. Michela Pitcher original date written was 01/24/2016. Last filled 07/27/2017. I have called the pt to ask what he has been taking. He has been on Protonix and then when Dr. Gala Romney did his procedure he started him on Dexilant.

## 2017-04-27 ENCOUNTER — Other Ambulatory Visit: Payer: Self-pay | Admitting: Gastroenterology

## 2017-05-01 MED ORDER — DEXLANSOPRAZOLE 60 MG PO CPDR: 60.0000 mg | DELAYED_RELEASE_CAPSULE | Freq: Every day | ORAL | 11 refills | Status: AC

## 2017-05-01 MED ORDER — OCALIVA 5 MG PO TABS: 1.0000 | ORAL_TABLET | Freq: Every day | ORAL | 3 refills | Status: AC

## 2017-05-01 NOTE — Telephone Encounter (Signed)
PT said he did not request refill for the Omeprazole. He does need Rx for the Dexilant. Also , had been unable to get the [place] due to insurance. Michela Pitcher he has gotten it straightened out now, and to please send another prescription for that.  Please send these Rx's to [facility].

## 2017-05-01 NOTE — Telephone Encounter (Signed)
rx done.  Please tell patient he needs to make sure Dr. Megan Salon (ID) is okay with him on Dexilant at same time as his Tivicay and Descovy.   Keep upcoming ov with AB.

## 2017-05-02 NOTE — Telephone Encounter (Signed)
PT is aware Rx sent in and to check with Dr. Megan Salon. He is also aware of his OV appt on 05/15/2017 at 11:00 Am with Roseanne Kaufman, NP.

## 2017-05-15 ENCOUNTER — Ambulatory Visit (INDEPENDENT_AMBULATORY_CARE_PROVIDER_SITE_OTHER): Payer: 59 | Admitting: Gastroenterology

## 2017-05-15 ENCOUNTER — Encounter: Payer: Self-pay | Admitting: Gastroenterology

## 2017-05-15 VITALS — BP 90/60 | HR 98 | Temp 97.6°F | Ht 73.0 in | Wt 120.4 lb

## 2017-05-15 DIAGNOSIS — K219 Gastro-esophageal reflux disease without esophagitis: Secondary | ICD-10-CM | POA: Diagnosis not present

## 2017-05-15 DIAGNOSIS — K5900 Constipation, unspecified: Secondary | ICD-10-CM | POA: Diagnosis not present

## 2017-05-15 DIAGNOSIS — K743 Primary biliary cirrhosis: Secondary | ICD-10-CM | POA: Diagnosis not present

## 2017-05-15 MED ORDER — FAMOTIDINE 20 MG PO TABS: 20.0000 mg | ORAL_TABLET | Freq: Two times a day (BID) | ORAL | 3 refills | Status: AC

## 2017-05-15 NOTE — Patient Instructions (Signed)
Take the Miralax once daily, and hold off on the Senokot for now.   Continue Dexilant, and you may take Pepcid as needed in the evenings. I sent Pepcid to your pharmacy.  We have ordered an ultrasound of your liver for routine purposes.  We will see how you are doing in 4-6 months!

## 2017-05-15 NOTE — Progress Notes (Signed)
Referring Provider: Sharilyn Sites, MD Primary Care Physician:  Sharilyn Sites, MD Primary GI: Dr. Gala Romney   Chief Complaint  Patient presents with  . Abdominal Pain  . Constipation  . Diarrhea    HPI:   Jeffrey Gutierrez is a 61 y.o. 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 Thorough work-up both here and at [facility]. Has had mesenteric doppler May 2017 which was negative for ischemia.  Stage IV lung adenocarcinoma. Has declined EGD historically although CT imaging noting diffuse wall thickening of esophagus in setting of known history of esophagitis. Followed by ID due to HIV. Taking Miralax and senokot, with at least 1 BM a day. 2 capfuls of Miralax with occasional loose stools. Just got back on Dexilant. Overall stable from a GI standpoint, continuing [place]. Agreeable to liver imaging. Wife present with him today.   Past Medical History:  Diagnosis Date  . Bone cancer ([place]) dx'd 03/2016   metastatic disease  . COPD (chronic obstructive pulmonary disease) ([place])   . Diverticulitis   . Essential hypertension   . GERD (gastroesophageal reflux disease)   . Hiatal hernia   . Hyperplastic rectal polyp   . Lung cancer ([place]) dx'd 03/2016   chemo ongoing; xrt comp   . PBC (primary biliary cirrhosis)    Diagnosed 2006  . S/P colonoscopy 2009   Pancolonic diverticula, hyperplastic rectal polyps    Past Surgical History:  Procedure Laterality Date  . Arm Surgery Left    ulnar nerve release  . CHOLECYSTECTOMY    . COLONOSCOPY  2009   Pancolonic diverticula, hyperplastic rectal polyps  . COLONOSCOPY [name] 03/24/2015   RMR: Single colonic polyp removed as described above. Pancolonic diverticulosis. Abnormal sigmoid colon consistent with stigmata of recent diverticulitis.   Marland Kitchen ESOPHAGOGASTRODUODENOSCOPY [name] 06/28/2015   RMR: mild erosive reflux/small HH  . INGUINAL HERNIA REPAIR Right Feb 2012   Dr. Arnoldo Morale   . INGUINAL HERNIA  REPAIR Left 11/12/2015   Procedure: HERNIA REPAIR INGUINAL ADULT WITH MESH;  Surgeon: Aviva Signs, MD;  Location: [facility];  Service: General;  Laterality: Left;  . INSERTION OF MESH Left 11/12/2015   Procedure: INSERTION OF MESH;  Surgeon: Aviva Signs, MD;  Location: [facility];  Service: General;  Laterality: Left;  . LIVER BIOPSY    . NASAL SEPTUM SURGERY      Current Outpatient Prescriptions  Medication Sig Dispense Refill  . albuterol (PROVENTIL HFA;VENTOLIN HFA) 108 (90 Base) MCG/ACT inhaler Inhale 2 puffs into the lungs every 4 (four) hours as needed for wheezing or shortness of breath. 1 Inhaler 0  . albuterol (PROVENTIL) (2.5 MG/3ML) 0.083% nebulizer solution Inhale 3 mLs into the lungs every 4 (four) hours.    . budesonide-formoterol (SYMBICORT) 160-4.5 MCG/ACT inhaler Inhale 2 puffs into the lungs 2 (two) times daily.    Marland Kitchen CALCIUM-VITAMIN D PO Take 1 tablet by mouth daily.    Marland Kitchen dexlansoprazole (DEXILANT) 60 MG capsule Take 1 capsule (60 mg total) by mouth daily. 30 capsule 11  . diltiazem (CARDIZEM CD) 120 MG 24 hr capsule Take 120 mg by mouth daily.    . dolutegravir (TIVICAY) 50 MG tablet Take 1 tablet (50 mg total) by mouth daily. 30 tablet 3  . DULoxetine (CYMBALTA) 30 MG capsule Take 30 mg by mouth every morning.     . famotidine (PEPCID) 20 MG tablet Take 1 tablet (20 mg total) by mouth 2 (two) times daily. [place]  tablet 0  . feeding supplement (BOOST HIGH PROTEIN) LIQD Take 1 Container by mouth 2 (two) times daily between meals. When he remembers    . folic acid (FOLVITE) 1 MG tablet Take 1 mg by mouth every morning.     . furosemide (LASIX) 20 MG tablet Take 1 tablet (20 mg total) by mouth 2 (two) times daily. 40 mg in the morning and 20 mg at bedtime. (Patient taking differently: Take 20 mg by mouth as needed. 40 mg in the morning and 20 mg at bedtime.) 60 tablet 0  . Melatonin 3 MG TABS Take 3 mg by mouth at bedtime.    Marland Kitchen morphine (MS CONTIN) 15 MG 12 hr tablet Take 1 tablet (15 mg  total) by mouth every 12 (twelve) hours. 60 tablet 0  . morphine (MSIR) 15 MG tablet Take 1 tablet (15 mg total) by mouth every 4 (four) hours as needed for severe pain. 90 tablet 0  . Multiple Vitamin (MULTIVITAMIN WITH MINERALS) TABS tablet Take 1 tablet by mouth every morning.     . OCALIVA 5 MG TABS Take 1 tablet by mouth daily. 30 tablet 3  . polyethylene glycol (MIRALAX / GLYCOLAX) packet Take 17 g by mouth once.     . TRUVADA 200-300 MG tablet Take 1 tablet by mouth daily.  2  . diltiazem (CARDIZEM) 30 MG tablet Take 1 tablet (30 mg total) by mouth every 8 (eight) hours. (Patient not taking: Reported on 05/15/2017) 90 tablet 0  . emtricitabine-tenofovir AF (DESCOVY) 200-25 MG tablet Take 1 tablet by mouth daily. (Patient not taking: Reported on 05/15/2017) 30 tablet 3  . predniSONE (DELTASONE) 10 MG tablet Take 4 tablets for 3 days; Take 3 tablets for 4 days; Take 2 tablets for 3 days; Take 1 tablet for 4 days (Patient not taking: Reported on 05/15/2017) 34 tablet 0  . sucralfate (CARAFATE) 1 g tablet TAKE 1 TABLET BY MOUTH 4 TIMES A DAY WITH MEALS AND AT BEDTIME (Patient not taking: Reported on 05/15/2017) 120 tablet 2  . tiotropium (SPIRIVA) 18 MCG inhalation capsule Place 18 mcg into inhaler and inhale every morning.      No current facility-administered medications for this visit.     Allergies as of 05/15/2017 - Review Complete 05/15/2017  Allergen Reaction Noted  . Dihydrocodeine Anaphylaxis 09/17/2015  . Hydrocodone Anaphylaxis and Other (See Comments)   . Doxycycline Nausea And Vomiting 03/20/2016  . Linzess [linaclotide] Diarrhea 07/26/2015    Family History  Problem Relation Age of Onset  . Lung cancer Mother   . Pneumonia Father   . Lung cancer Father   . Pancreatitis Father     Social History   Social History  . Marital status: Married    Spouse name: [name]  . Number of children: [name]  . Years of education: [name]   Social History Main Topics  . Smoking status: Former  Smoker    Packs/day: 0.25    Years: 43.00    Types: Cigarettes  . Smokeless tobacco: Never Used     Comment: hasn't smoked since entering the hosp  . Alcohol use No  . Drug use: No  . Sexual activity: Yes   Other Topics Concern  . None   Social History Narrative  . None    Review of Systems: Gen: Denies fever, chills, anorexia. Denies fatigue, weakness, weight loss.  CV: Denies chest pain, palpitations, syncope, peripheral edema, and claudication. Resp: Denies dyspnea at rest, cough, wheezing, coughing up blood,  and pleurisy. GI: Denies vomiting blood, jaundice, and fecal incontinence.   Denies dysphagia or odynophagia. Derm: Denies rash, itching, dry skin Psych: Denies depression, anxiety, memory loss, confusion. No homicidal or suicidal ideation.  Heme: Denies bruising, bleeding, and enlarged lymph nodes.  Physical Exam: BP (!) 70/47 REPEAT 90/60   Pulse 98   Temp 97.6 F (36.4 C) (Oral)   Ht 6\' 1"  (1.854 m)   Wt 120 lb 6.4 oz (54.6 kg)   BMI 15.88 kg/m  General:   Alert and oriented. No distress noted. Chronically ill Head:  Normocephalic and atraumatic. Eyes:  Conjuctiva clear without scleral icterus. Mouth:  Oral mucosa pink and moist.  Abdomen:  +BS, soft, non-tender and non-distended. No rebound or guarding. No HSM or masses noted. Msk: with kyphosis  Extremities:  Without edema. Neurologic:  Alert and  oriented x4 Psych:  Alert and cooperative. Normal mood and affect.  Lab Results  Component Value Date   ALT 19 02/28/2017   AST 15 02/28/2017   ALKPHOS 87 02/28/2017   BILITOT 0.3 02/28/2017   Lab Results  Component Value Date   WBC 5.1 02/02/2017   HGB 11.3 (L) 02/02/2017   HCT 35.8 (L) 02/02/2017   MCV 97.0 02/02/2017   PLT 124 (L) 02/02/2017   Lab Results  Component Value Date   CREATININE 0.81 02/28/2017   BUN 20 02/28/2017   NA 139 02/28/2017   K 4.2 02/28/2017   CL 96 (L) 02/28/2017   CO2 36 (H) 02/28/2017

## 2017-05-17 NOTE — Progress Notes (Signed)
cc'ed to pcp °

## 2017-05-17 NOTE — Assessment & Plan Note (Addendum)
Continue Ocaliva. US abdomen ordered. Multiple non-GI health issues going on with slow, steady decline. Now on oxygen. Return in 4-6 months as health permits.

## 2017-05-17 NOTE — Assessment & Plan Note (Addendum)
Resume Dexilant. Pepcid prn.

## 2017-05-17 NOTE — Assessment & Plan Note (Signed)
Miralax BID causing diarrhea. Decrease to once a day. Continue Senokot.

## 2017-05-23 ENCOUNTER — Ambulatory Visit (HOSPITAL_COMMUNITY)
Admission: RE | Admit: 2017-05-23 | Discharge: 2017-05-23 | Disposition: A | Discharge status: Home / Self Care | Payer: 59 | Source: Ambulatory Visit | Attending: Gastroenterology | Admitting: Gastroenterology

## 2017-05-23 DIAGNOSIS — J90 Pleural effusion, not elsewhere classified: Secondary | ICD-10-CM | POA: Diagnosis not present

## 2017-05-23 DIAGNOSIS — K743 Primary biliary cirrhosis: Secondary | ICD-10-CM

## 2017-05-30 NOTE — Progress Notes (Signed)
Known chronic right pleural effusion. No concerning liver findings. Will check in 6 months, health permitting.

## 2017-05-30 NOTE — Progress Notes (Signed)
ON RECALL  °

## 2017-06-11 ENCOUNTER — Emergency Department (HOSPITAL_COMMUNITY): Payer: 59

## 2017-06-11 ENCOUNTER — Emergency Department (HOSPITAL_COMMUNITY)
Admission: EM | Admit: 2017-06-11 | Discharge: 2017-06-11 | Disposition: A | Discharge status: Home / Self Care | Payer: 59 | Attending: Emergency Medicine | Admitting: Emergency Medicine

## 2017-06-11 ENCOUNTER — Encounter (HOSPITAL_COMMUNITY): Payer: Self-pay | Admitting: Emergency Medicine

## 2017-06-11 DIAGNOSIS — R0602 Shortness of breath: Secondary | ICD-10-CM | POA: Diagnosis present

## 2017-06-11 DIAGNOSIS — C7951 Secondary malignant neoplasm of bone: Secondary | ICD-10-CM | POA: Insufficient documentation

## 2017-06-11 DIAGNOSIS — C3490 Malignant neoplasm of unspecified part of unspecified bronchus or lung: Secondary | ICD-10-CM | POA: Diagnosis not present

## 2017-06-11 DIAGNOSIS — B20 Human immunodeficiency virus [HIV] disease: Secondary | ICD-10-CM | POA: Insufficient documentation

## 2017-06-11 DIAGNOSIS — Z87891 Personal history of nicotine dependence: Secondary | ICD-10-CM | POA: Insufficient documentation

## 2017-06-11 DIAGNOSIS — Z79899 Other long term (current) drug therapy: Secondary | ICD-10-CM | POA: Insufficient documentation

## 2017-06-11 DIAGNOSIS — J441 Chronic obstructive pulmonary disease with (acute) exacerbation: Secondary | ICD-10-CM | POA: Diagnosis not present

## 2017-06-11 HISTORY — DX: Human immunodeficiency virus (HIV) disease: B20

## 2017-06-11 HISTORY — DX: Asymptomatic human immunodeficiency virus (hiv) infection status: Z21

## 2017-06-11 LAB — TROPONIN I: Troponin I: <0.03 ng/mL (ref <0.03)

## 2017-06-11 LAB — CBC WITH DIFFERENTIAL/PLATELET
BASOS ABS: 0 10*3/uL (ref 0.0–0.1)
Basophils Relative: 1 %
EOS PCT: 2 %
Eosinophils Absolute: 0.1 10*3/uL (ref 0.0–0.7)
HEMATOCRIT: 31.8 % — AB (ref 39.0–52.0)
HEMOGLOBIN: 10.1 g/dL — AB (ref 13.0–17.0)
LYMPHS ABS: 1.1 10*3/uL (ref 0.7–4.0)
LYMPHS PCT: 31 %
MCH: 28.9 pg (ref 26.0–34.0)
MCHC: 31.8 g/dL (ref 30.0–36.0)
MCV: 91.1 fL (ref 78.0–100.0)
Monocytes Absolute: 0.4 10*3/uL (ref 0.1–1.0)
Monocytes Relative: 11 %
NEUTROS ABS: 2.1 10*3/uL (ref 1.7–7.7)
NEUTROS PCT: 55 %
Platelets: 159 10*3/uL (ref 150–400)
RBC: 3.49 MIL/uL — ABNORMAL LOW (ref 4.22–5.81)
RDW: 14.7 % (ref 11.5–15.5)
WBC: 3.7 10*3/uL — AB (ref 4.0–10.5)

## 2017-06-11 LAB — COMPREHENSIVE METABOLIC PANEL
ALBUMIN: 2.5 g/dL — AB (ref 3.5–5.0)
ALK PHOS: 101 U/L (ref 38–126)
ALT: 12 U/L — ABNORMAL LOW (ref 17–63)
ANION GAP: 7 (ref 5–15)
AST: 17 U/L (ref 15–41)
BILIRUBIN TOTAL: 0.4 mg/dL (ref 0.3–1.2)
BUN: 7 mg/dL (ref 6–20)
CALCIUM: 8.3 mg/dL — AB (ref 8.9–10.3)
CO2: 35 mmol/L — AB (ref 22–32)
Chloride: 92 mmol/L — ABNORMAL LOW (ref 101–111)
Creatinine, Ser: 0.51 mg/dL — ABNORMAL LOW (ref 0.61–1.24)
GFR calc Af Amer: >60 mL/min (ref >60)
GFR calc non Af Amer: >60 mL/min (ref >60)
GLUCOSE: 128 mg/dL — AB (ref 65–99)
POTASSIUM: 4.1 mmol/L (ref 3.5–5.1)
SODIUM: 134 mmol/L — AB (ref 135–145)
TOTAL PROTEIN: 6 g/dL — AB (ref 6.5–8.1)

## 2017-06-11 LAB — BRAIN NATRIURETIC PEPTIDE: B Natriuretic Peptide: 93 pg/mL (ref 0.0–100.0)

## 2017-06-11 MED ORDER — ALBUTEROL SULFATE (2.5 MG/3ML) 0.083% IN NEBU
2.5000 mg | INHALATION_SOLUTION | Freq: Once | RESPIRATORY_TRACT | Status: AC
  Administered 2017-06-11: 2.5 mg via RESPIRATORY_TRACT
  Filled 2017-06-11: qty 3

## 2017-06-11 MED ORDER — LEVOFLOXACIN 500 MG PO TABS
500.0000 mg | ORAL_TABLET | Freq: Once | ORAL | Status: AC
  Administered 2017-06-11: 500 mg via ORAL
  Filled 2017-06-11: qty 1

## 2017-06-11 MED ORDER — IPRATROPIUM-ALBUTEROL 0.5-2.5 (3) MG/3ML IN SOLN
3.0000 mL | Freq: Once | RESPIRATORY_TRACT | Status: AC
  Administered 2017-06-11: 3 mL via RESPIRATORY_TRACT
  Filled 2017-06-11: qty 3

## 2017-06-11 MED ORDER — METHYLPREDNISOLONE SODIUM SUCC 125 MG IJ SOLR
125.0000 mg | Freq: Once | INTRAMUSCULAR | Status: AC
  Administered 2017-06-11: 125 mg via INTRAVENOUS
  Filled 2017-06-11: qty 2

## 2017-06-11 MED ORDER — LEVOFLOXACIN 500 MG PO TABS: 500.0000 mg | ORAL_TABLET | Freq: Every day | ORAL | 0 refills | Status: AC

## 2017-06-11 MED ORDER — PREDNISONE 10 MG PO TABS: 20.0000 mg | ORAL_TABLET | Freq: Every day | ORAL | 0 refills | Status: AC

## 2017-06-11 NOTE — Discharge Instructions (Signed)
Follow up with your md this week for recheck  °

## 2017-06-11 NOTE — ED Triage Notes (Signed)
Pt c/o increasing SOB since last night . No relief from home nebulizer tx. Pt has history of lung ca and chronic pne.

## 2017-06-11 NOTE — ED Provider Notes (Signed)
[facility] DEPT Provider Note   CSN: [id number] Arrival date & time: 06/11/17  1817     History   Chief Complaint Chief Complaint  Patient presents with  . Shortness of Breath    HPI Jeffrey Gutierrez is a 62 y.o. male.  Patient complains of cough and shortness of breath. Patient has a history of COPD   The history is provided by the patient.  Shortness of Breath  This is a recurrent problem. The problem occurs frequently.The current episode started 2 days ago. The problem has not changed since onset.Pertinent negatives include no fever, no headaches, no cough, no chest pain, no abdominal pain and no rash.    Past Medical History:  Diagnosis Date  . Bone cancer ([place]) dx'd 03/2016   metastatic disease  . COPD (chronic obstructive pulmonary disease) ([place])   . Diverticulitis   . Essential hypertension   . GERD (gastroesophageal reflux disease)   . Hiatal hernia   . HIV (human immunodeficiency virus infection) ([place])   . Hyperplastic rectal polyp   . Lung cancer ([place]) dx'd 03/2016   chemo ongoing; xrt comp   . PBC (primary biliary cirrhosis)    Diagnosed 2006  . S/P colonoscopy 2009   Pancolonic diverticula, hyperplastic rectal polyps    Patient Active Problem List   Diagnosis Date Noted  . COPD exacerbation ([place]) 01/31/2017  . Gout 07/20/2016  . Dysphagia, pharyngoesophageal phase   . HIV disease ([place])   . Pressure injury of skin 07/02/2016  . Aspiration pneumonia of both lower lobes ([place])   . Normocytic anemia 05/27/2016  . Antineoplastic chemotherapy induced pancytopenia ([place]) 05/27/2016  . Depression with anxiety 05/27/2016  . GERD (gastroesophageal reflux disease) 05/27/2016  . Cellulitis 05/27/2016  . COPD (chronic obstructive pulmonary disease) ([place]) 04/28/2016  . Protein-calorie malnutrition, severe 04/28/2016  . 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 Metastasis to bone ([place]) 04/01/2016  . Adenocarcinoma of right lung, stage 4 ([place]) 04/01/2016  . Loss of  weight 12/22/2015  . 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 Hiatal hernia   . Esophagitis   . Diverticulosis of colon without hemorrhage   . History of colonic polyps   . Constipation 02/04/2015  . CHOLECYSTECTOMY, HX OF 01/20/2009    Past Surgical History:  Procedure Laterality Date  . Arm Surgery Left    ulnar nerve release  . CHOLECYSTECTOMY    . COLONOSCOPY  2009   Pancolonic diverticula, hyperplastic rectal polyps  . COLONOSCOPY [name] 03/24/2015   RMR: Single colonic polyp removed as described above. Pancolonic diverticulosis. Abnormal sigmoid colon consistent with stigmata of recent diverticulitis.   Marland Kitchen ESOPHAGOGASTRODUODENOSCOPY [name] 06/28/2015   RMR: mild erosive reflux/small HH  . INGUINAL HERNIA REPAIR Right Feb 2012   Dr. Arnoldo Morale   . INGUINAL HERNIA REPAIR Left 11/12/2015   Procedure: HERNIA REPAIR INGUINAL ADULT WITH MESH;  Surgeon: Aviva Signs, MD;  Location: [facility];  Service: General;  Laterality: Left;  . INSERTION OF MESH Left 11/12/2015   Procedure: INSERTION OF MESH;  Surgeon: Aviva Signs, MD;  Location: [facility];  Service: General;  Laterality: Left;  . LIVER BIOPSY    . NASAL SEPTUM SURGERY         Home Medications    Prior to Admission medications   Medication Sig Start Date End Date Taking? Authorizing Provider  albuterol (PROVENTIL HFA;VENTOLIN HFA) 108 (90 Base) MCG/ACT inhaler Inhale 2 puffs into the lungs every 4 (four) hours as needed for wheezing or shortness of breath.  09/17/15  Yes Mesner, Corene Cornea, MD  budesonide-formoterol (SYMBICORT) 160-4.5 MCG/ACT inhaler Inhale 2 puffs into the lungs 2 (two) times daily.   Yes [provider]  CALCIUM-VITAMIN D PO Take 1 tablet by mouth daily.   Yes [provider]  dexlansoprazole (DEXILANT) 60 MG capsule Take 1 capsule (60 mg total) by mouth daily. 05/01/17  Yes Mahala Menghini, PA-C  diltiazem (CARDIZEM CD) 240 MG 24 hr capsule Take 240 mg by mouth every evening.  06/11/17  Yes [provider]  dolutegravir (TIVICAY) 50 MG tablet Take 1 tablet (50 mg total) by mouth daily. 07/07/16  Yes Eugenie Filler, MD  DULoxetine (CYMBALTA) 30 MG capsule Take 30 mg by mouth every morning.  05/09/16  Yes [provider]  famotidine (PEPCID) 20 MG tablet Take 1 tablet (20 mg total) by mouth 2 (two) times daily. Patient taking differently: Take 20 mg by mouth at bedtime.  05/15/17  Yes Annitta Needs, NP  feeding supplement (BOOST HIGH PROTEIN) LIQD Take 1 Container by mouth 2 (two) times daily between meals. When he remembers   Yes [provider]  FIBER ADULT GUMMIES PO Take 2 each by mouth daily.   Yes [provider]  folic acid (FOLVITE) 1 MG tablet Take 1 mg by mouth every morning.    Yes [provider]  furosemide (LASIX) 20 MG tablet Take 1 tablet (20 mg total) by mouth 2 (two) times daily. 40 mg in the morning and 20 mg at bedtime. Patient taking differently: Take 20 mg by mouth daily as needed for fluid or edema. When taken, take 40 mg in the morning and 20 mg at bedtime. 02/02/17  Yes Patrecia Pour, Christean Grief, MD  ipratropium-albuterol (DUONEB) 0.5-2.5 (3) MG/3ML SOLN Take 3 mLs by nebulization every 6 (six) hours as needed (for shortness of breath).   Yes [provider]  LORazepam (ATIVAN) 1 MG tablet Take 1 mg by mouth 2 (two) times daily as needed for anxiety.  06/07/17  Yes [provider]  Melatonin 3 MG TABS Take 3 mg by mouth at bedtime.   Yes [provider]  morphine (MS CONTIN) 15 MG 12 hr tablet Take 1 tablet (15 mg total) by mouth every 12 (twelve) hours. 01/04/17  Yes Bruning, Ashlyn, PA-C  morphine (MSIR) 15 MG tablet Take 1 tablet (15 mg total) by mouth every 4 (four) hours as needed for severe pain. 01/19/17  Yes Bruning, Ashlyn, PA-C  Multiple Vitamin (MULTIVITAMIN WITH MINERALS) TABS tablet Take 1 tablet by mouth every morning.    Yes [provider]  OCALIVA 5 MG TABS Take 1 tablet by mouth daily.  05/01/17  Yes Mahala Menghini, PA-C  OXYGEN Inhale 6 L into the lungs daily.   Yes [provider]  polyethylene glycol (MIRALAX / GLYCOLAX) packet Take 17 g by mouth daily as needed for mild constipation or moderate constipation.    Yes [provider]  Probiotic Product (PROBIOTIC FORMULA PO) Take 2 capsules by mouth daily.   Yes [provider]  TRUVADA 200-300 MG tablet Take 1 tablet by mouth daily. 04/19/17  Yes [provider]  levofloxacin (LEVAQUIN) 500 MG tablet Take 1 tablet (500 mg total) by mouth daily. 06/11/17   Milton Ferguson, MD  predniSONE (DELTASONE) 10 MG tablet Take 2 tablets (20 mg total) by mouth daily. 06/11/17   Milton Ferguson, MD    Family History Family History  Problem Relation Age of Onset  .  Lung cancer Mother   . Pneumonia Father   . Lung cancer Father   . Pancreatitis Father     Social History Social History  Substance Use Topics  . Smoking status: Former Smoker    Packs/day: 0.25    Years: 43.00    Types: Cigarettes  . Smokeless tobacco: Never Used     Comment: hasn't smoked since entering the hosp  . Alcohol use No     Allergies   Dihydrocodeine; Hydrocodone; Doxycycline; and Linzess [linaclotide]   Review of Systems Review of Systems  Constitutional: Negative for appetite change, fatigue and fever.  HENT: Negative for congestion, ear discharge and sinus pressure.   Eyes: Negative for discharge.  Respiratory: Positive for shortness of breath. Negative for cough.   Cardiovascular: Negative for chest pain.  Gastrointestinal: Negative for abdominal pain and diarrhea.  Genitourinary: Negative for frequency and hematuria.  Musculoskeletal: Negative for back pain.  Skin: Negative for rash.  Neurological: Negative for seizures and headaches.  Psychiatric/Behavioral: Negative for hallucinations.     Physical Exam Updated Vital Signs BP 128/75   Pulse (!) 107   Temp 98.8 F (37.1 C)   Resp 18   Ht 5\' 9"   (1.753 m)   Wt 54.4 kg (120 lb)   SpO2 90%   BMI 17.72 kg/m   Physical Exam  Constitutional: He is oriented to person, place, and time. He appears well-developed.  HENT:  Head: Normocephalic.  Eyes: Conjunctivae and EOM are normal. No scleral icterus.  Neck: Neck supple. No thyromegaly present.  Cardiovascular: Normal rate and regular rhythm.  Exam reveals no gallop and no friction rub.   No murmur heard. Pulmonary/Chest: No stridor. He has wheezes. He has no rales. He exhibits no tenderness.  Abdominal: He exhibits no distension. There is no tenderness. There is no rebound.  Musculoskeletal: Normal range of motion. He exhibits no edema.  Lymphadenopathy:    He has no cervical adenopathy.  Neurological: He is oriented to person, place, and time. He exhibits normal muscle tone. Coordination normal.  Skin: No rash noted. No erythema.  Psychiatric: He has a normal mood and affect. His behavior is normal.     ED Treatments / Results  Labs (all labs ordered are listed, but only abnormal results are displayed) Labs Reviewed  CBC WITH DIFFERENTIAL/PLATELET - Abnormal; Notable for the following:       Result Value   WBC 3.7 (*)    RBC 3.49 (*)    Hemoglobin 10.1 (*)    HCT 31.8 (*)    All other components within normal limits  COMPREHENSIVE METABOLIC PANEL - Abnormal; Notable for the following:    Sodium 134 (*)    Chloride 92 (*)    CO2 35 (*)    Glucose, Bld 128 (*)    Creatinine, Ser 0.51 (*)    Calcium 8.3 (*)    Total Protein 6.0 (*)    Albumin 2.5 (*)    ALT 12 (*)    All other components within normal limits  BRAIN NATRIURETIC PEPTIDE  TROPONIN I    EKG  EKG Interpretation None       Radiology Dg Chest Portable 1 View  Result Date: 06/11/2017 CLINICAL DATA:  Increasing dyspnea, history of lung cancer and chronic pneumonia. EXAM: PORTABLE CHEST 1 VIEW COMPARISON:  05/04/2017 CXR FINDINGS: Right IJ port catheter tip terminates in the distal SVC. Heart size is  normal. No aortic aneurysm. Central pulmonary vascular prominence consistent with pulmonary hypertension. Persistent  confluent opacities at the right lung base without significant change. Slight obscuration of the right costophrenic angle. Findings may reflect a small right effusion. No overt pulmonary edema. IMPRESSION: Chronic airspace disease at the right lung base with probable associated small pleural effusion. No significant change. Electronically Signed   By: Ashley Royalty M.D.   On: 06/11/2017 19:08    Procedures Procedures (including critical care time)  Medications Ordered in ED Medications  methylPREDNISolone sodium succinate (SOLU-MEDROL) 125 mg/2 mL injection 125 mg (125 mg Intravenous Given 06/11/17 1925)  ipratropium-albuterol (DUONEB) 0.5-2.5 (3) MG/3ML nebulizer solution 3 mL (3 mLs Nebulization Given 06/11/17 1950)  albuterol (PROVENTIL) (2.5 MG/3ML) 0.083% nebulizer solution 2.5 mg (2.5 mg Nebulization Given 06/11/17 1957)  ipratropium-albuterol (DUONEB) 0.5-2.5 (3) MG/3ML nebulizer solution 3 mL (3 mLs Nebulization Given 06/11/17 2140)  albuterol (PROVENTIL) (2.5 MG/3ML) 0.083% nebulizer solution 2.5 mg (2.5 mg Nebulization Given 06/11/17 2136)     Initial Impression / Assessment and Plan / ED Course  I have reviewed the triage vital signs and the nursing notes.  Pertinent labs & imaging results that were available during my care of the patient were reviewed by me and considered in my medical decision making (see chart for details).     Patient with exacerbation of his COPD. He has been coughing up yellow sputum. No pneumonias obviously seen on chest x-ray but patient has significant chronic changes. He will be placed on prednisone Levaquin and continue his albuterol  Final Clinical Impressions(s) / ED Diagnoses   Final diagnoses:  COPD exacerbation (HCC)    New Prescriptions New Prescriptions   LEVOFLOXACIN (LEVAQUIN) 500 MG TABLET    Take 1 tablet (500 mg total) by  mouth daily.   PREDNISONE (DELTASONE) 10 MG TABLET    Take 2 tablets (20 mg total) by mouth daily.     Milton Ferguson, MD 06/11/17 2244

## 2017-06-13 ENCOUNTER — Telehealth: Payer: Self-pay

## 2017-06-13 NOTE — Telephone Encounter (Signed)
[facility] from [facility] called- pt is having severe RUQ abd pain that is not being relieved by morphine. Pt has already taken 3-4 morphine today and it hasnt helped.  No N/V, no fever, no blood in his stool. I spoke with [facility] and pt needs to be evaluated in the ED. I have informed Star and she will inform the pt.

## 2017-06-14 NOTE — Telephone Encounter (Signed)
Agree 

## 2017-07-06 IMAGING — DX DG CHEST 2V
2 series · 2 of 2 positions shown · non-contrast
Comparison: 09/17/2015.

CLINICAL DATA: Hernia.

EXAM:
CHEST  2 VIEW

[chest pa]
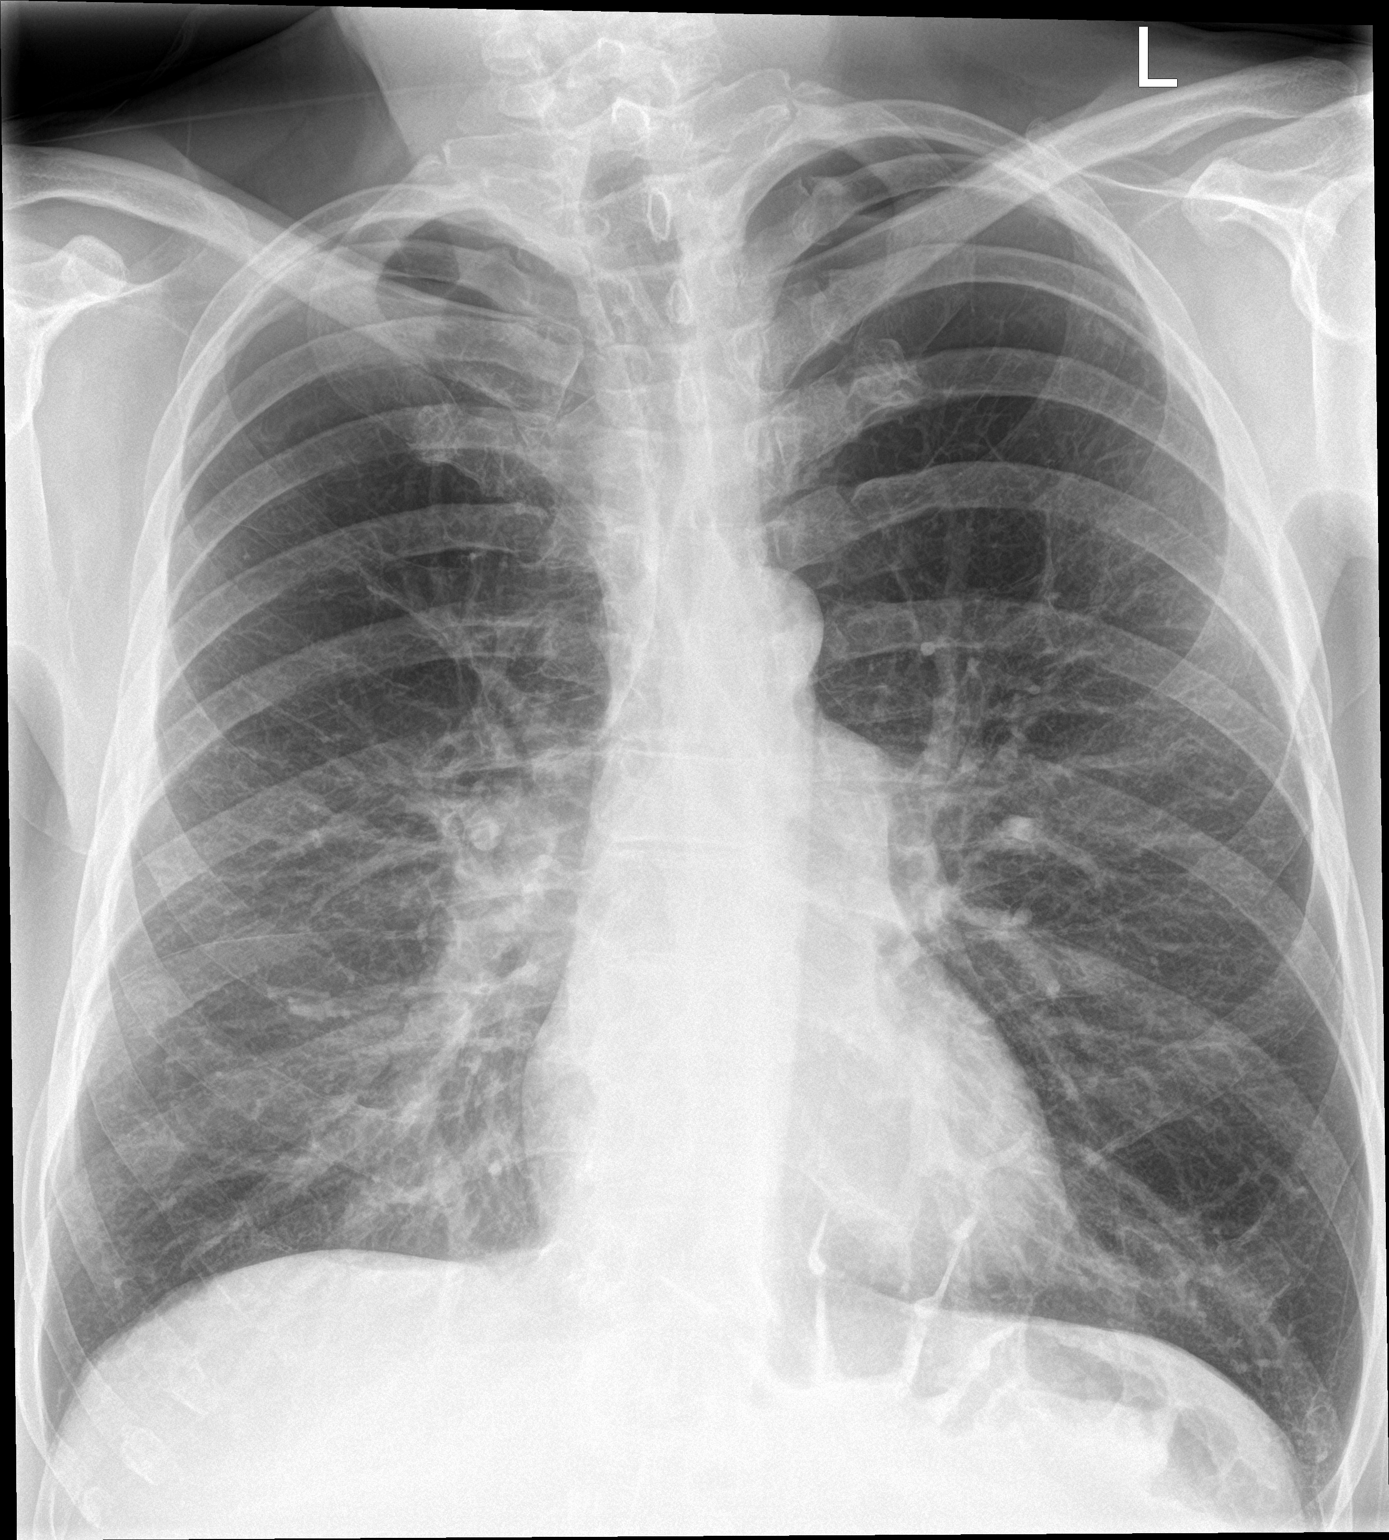

[chest lat]
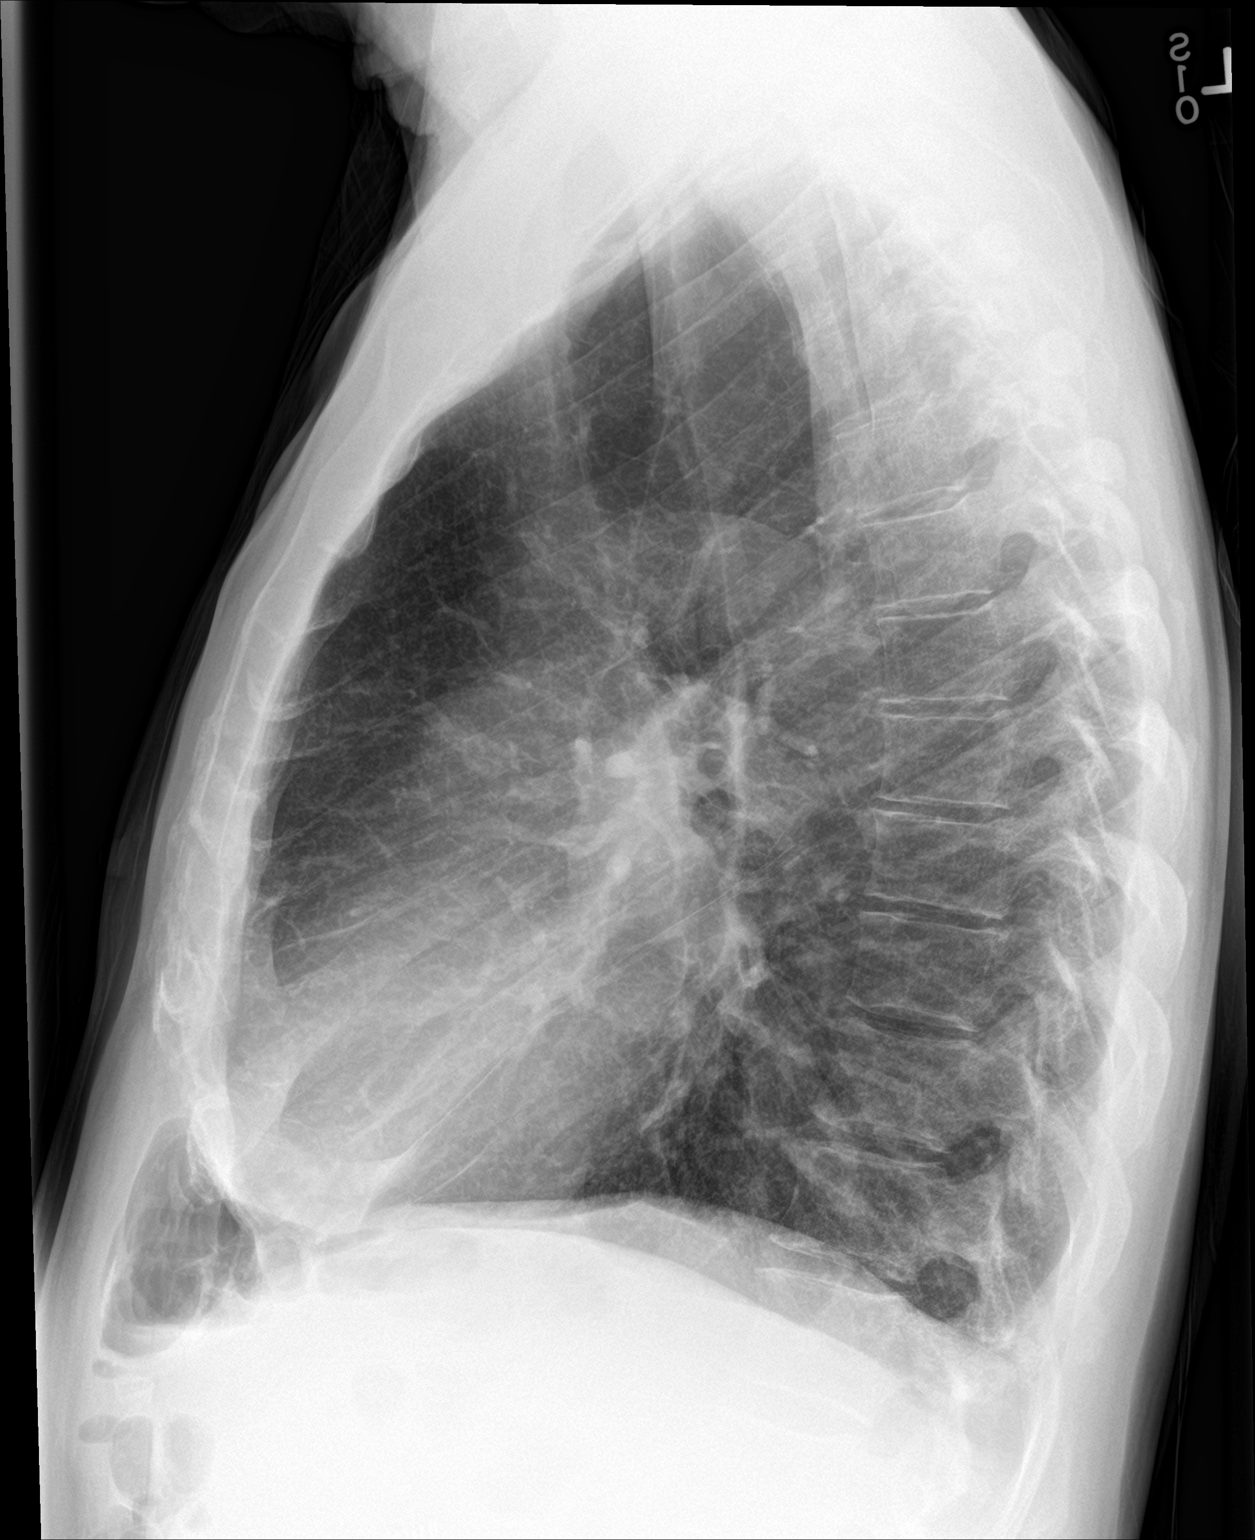

[2 of 2 positions shown; findings below may reference images not displayed]

FINDINGS: Mediastinum hilar structures stable. Heart size stable. Changes of
COPD and pleural parenchymal scarring again noted. No acute
pulmonary infiltrate. Degenerative changes thoracic spine .
IMPRESSION: COPD and pleural parenchymal scarring. No acute cardiopulmonary
disease.

## 2017-09-03 ENCOUNTER — Ambulatory Visit: Payer: 59 | Admitting: Gastroenterology

## 2017-10-08 ENCOUNTER — Telehealth: Payer: Self-pay | Admitting: Internal Medicine

## 2017-10-08 NOTE — Telephone Encounter (Signed)
noted 

## 2017-10-08 NOTE — Telephone Encounter (Signed)
Pt is deceased. I have sent the family a sympathy card.

## 2017-10-08 NOTE — Telephone Encounter (Signed)
Noted  

## 2017-10-09 NOTE — Telephone Encounter (Signed)
noted 

## 2017-10-12 ENCOUNTER — Other Ambulatory Visit: Payer: Self-pay | Admitting: Nurse Practitioner

## 2017-10-19 DEATH — deceased

## 2017-11-17 IMAGING — CR DG CHEST 1V PORT
2 series · 2 of 2 positions shown · non-contrast
Comparison: 01/05/2016

CLINICAL DATA: Status post lymph node biopsy.

EXAM:
PORTABLE CHEST 1 VIEW

[AP (1 of 2)]
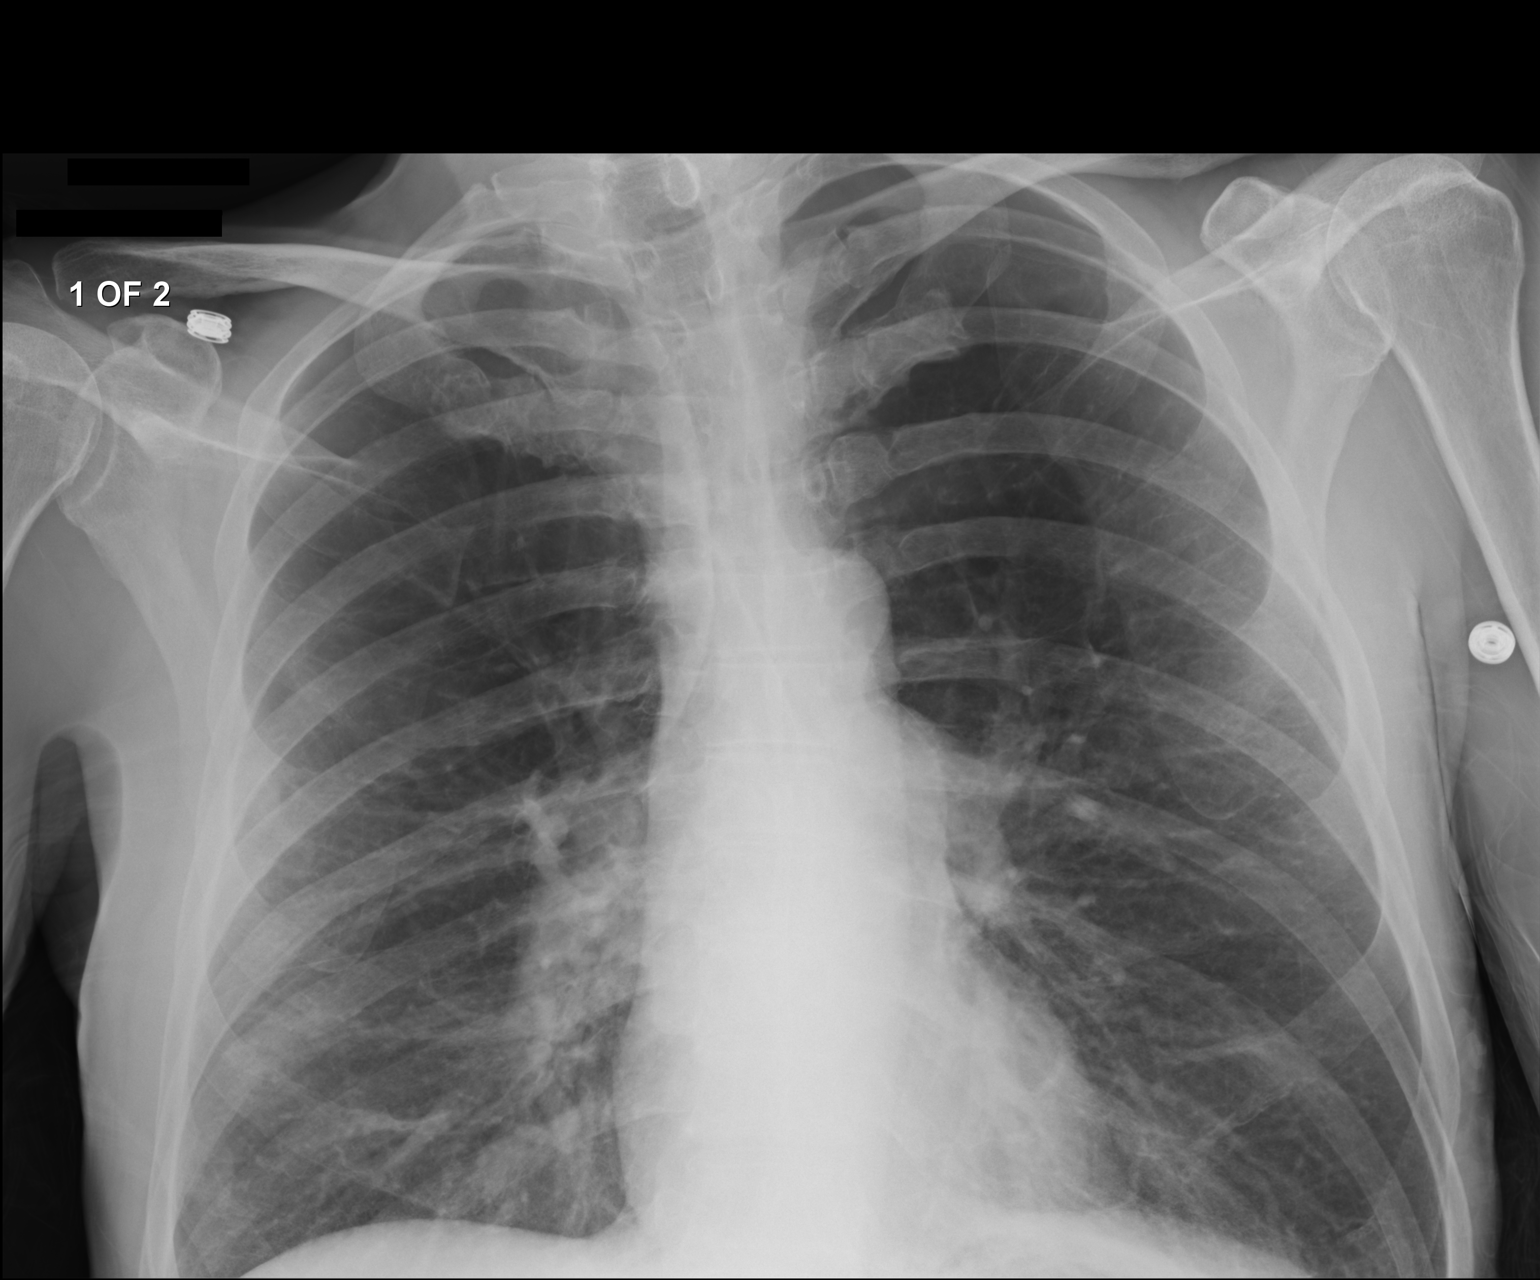

[AP (2 of 2)]
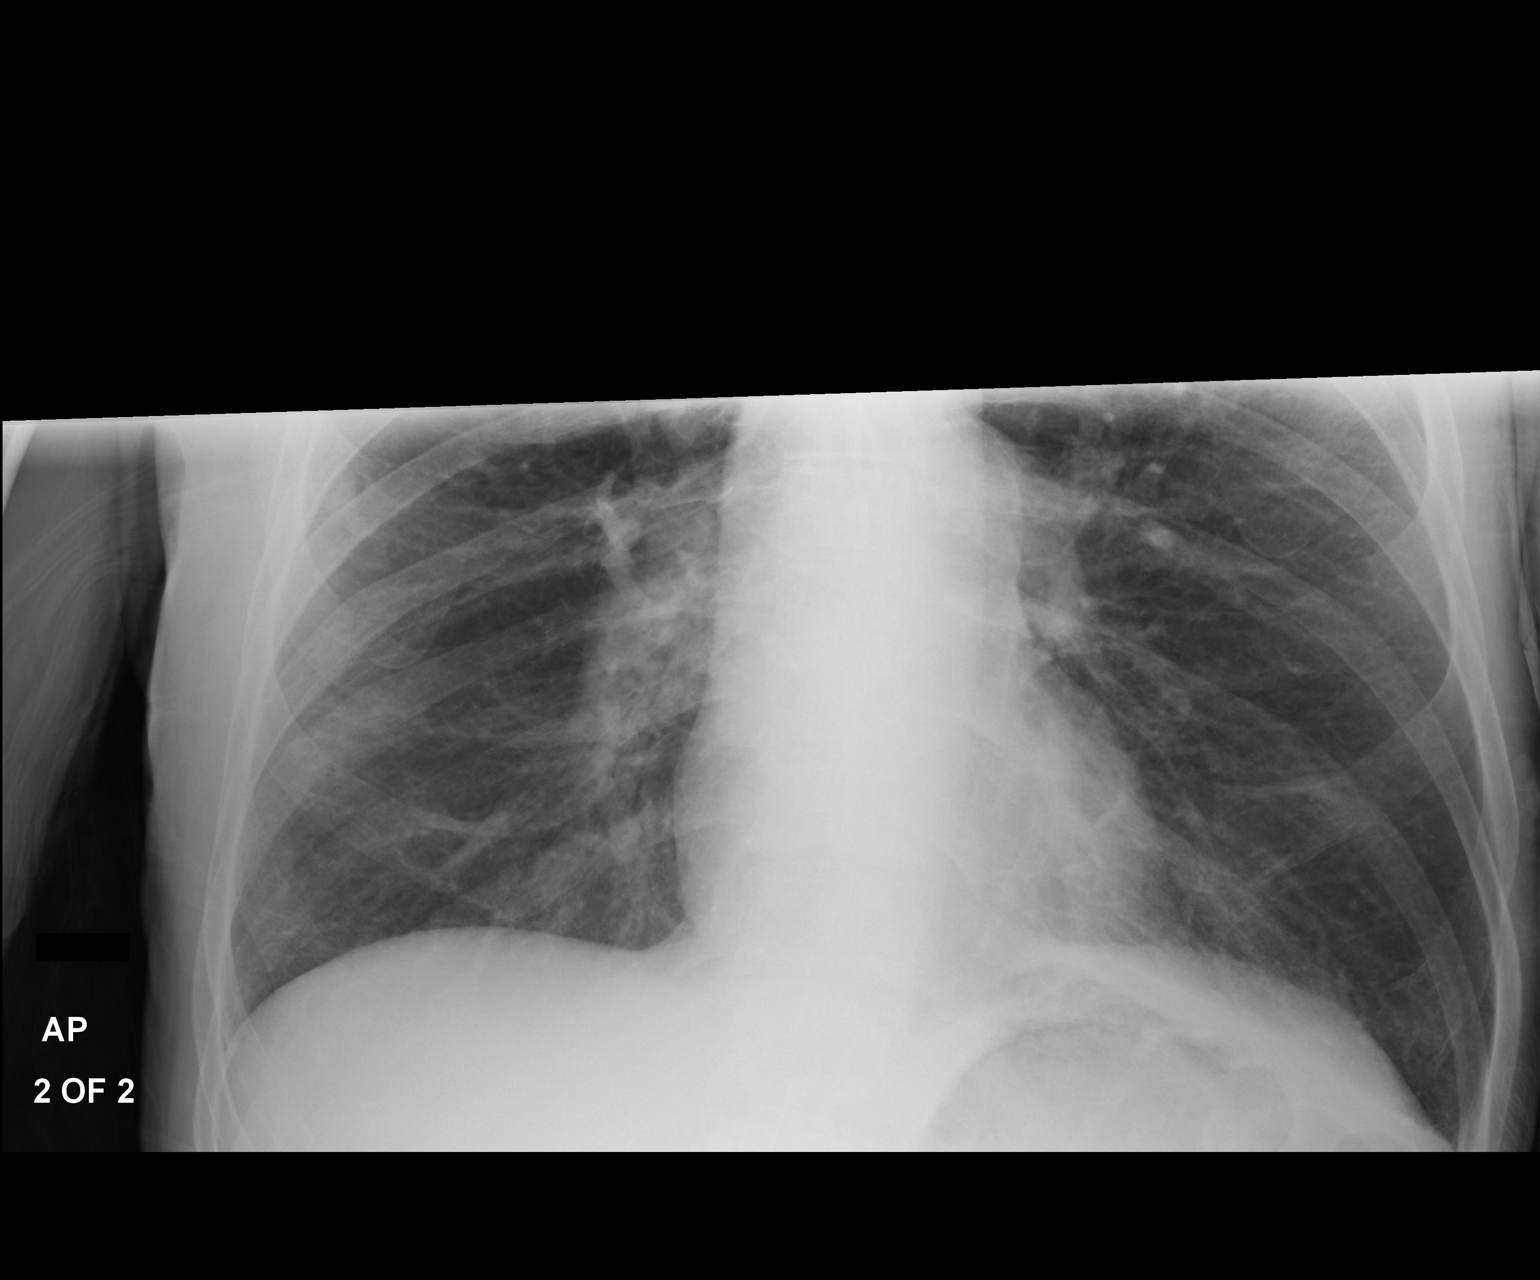

[2 of 2 positions shown; findings below may reference images not displayed]

FINDINGS: Emphysema without visible pneumothorax. No signs of parenchymal or
mediastinal hemorrhage. Subtle right-sided parenchymal nodularity as
seen on recent chest CT. Normal heart size and aortic contours. No
acute osseous finding.
IMPRESSION: No acute finding after biopsy.

Emphysema. ([id number])

## 2018-01-31 IMAGING — CR DG CHEST 2V
3 series · 3 of 3 positions shown · non-contrast
Comparison: 04/27/2016 chest x-ray

CLINICAL DATA: Near syncope

EXAM:
CHEST  2 VIEW

[w chest pa]
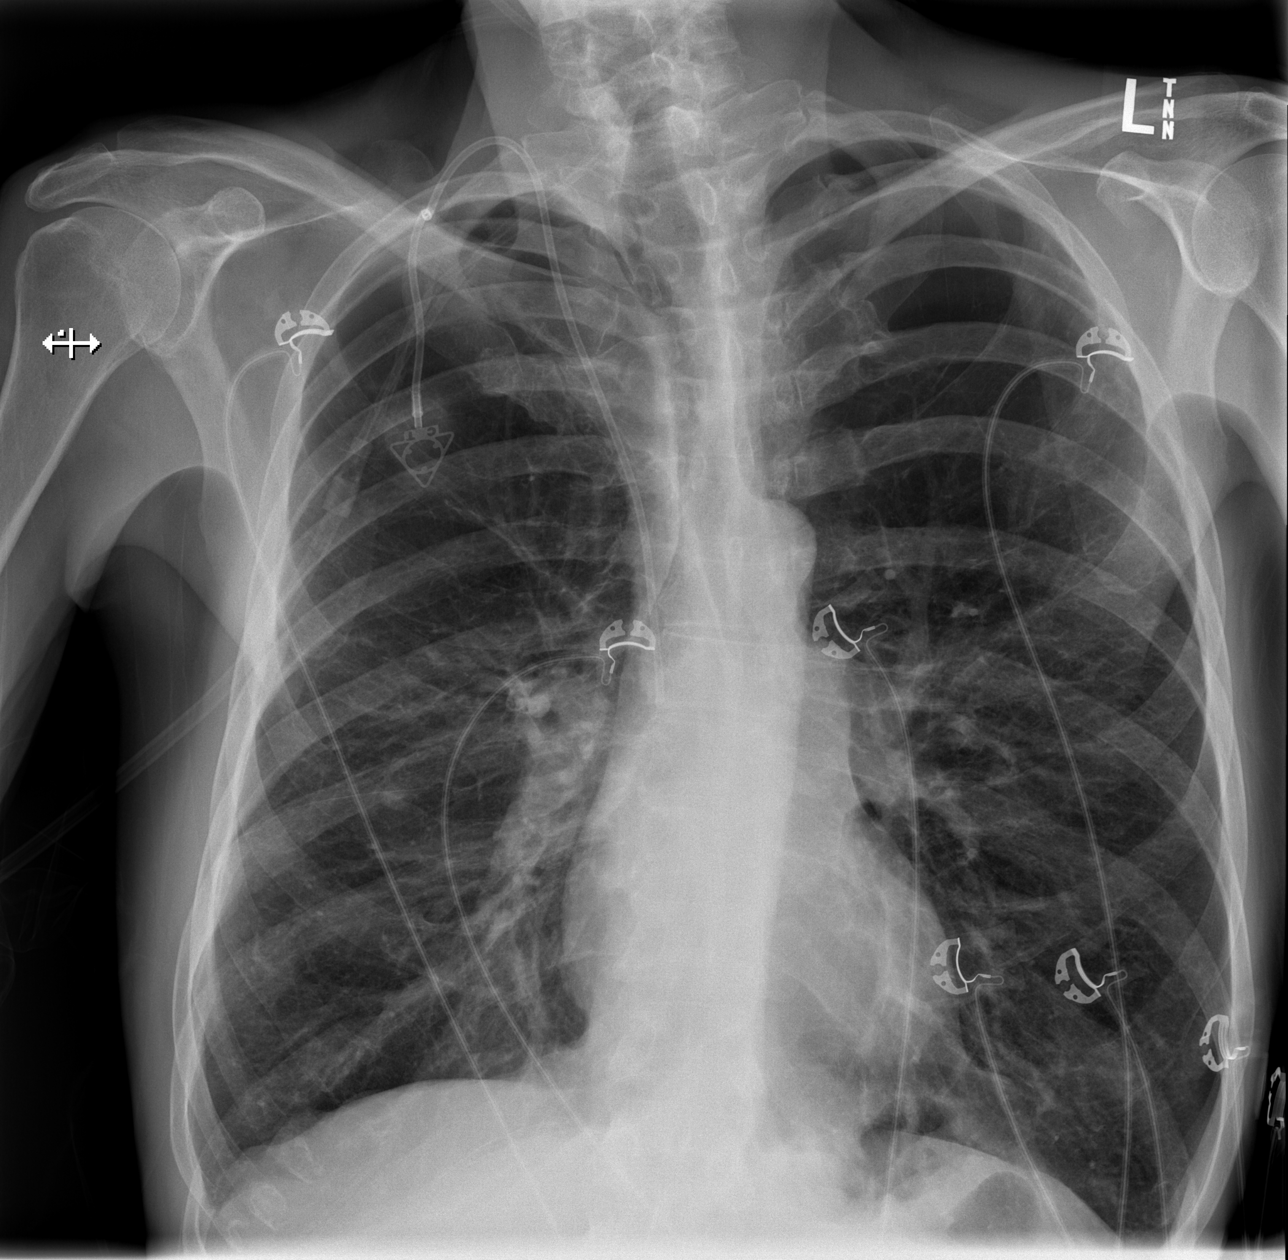

[w chest lat (1 of 2)]
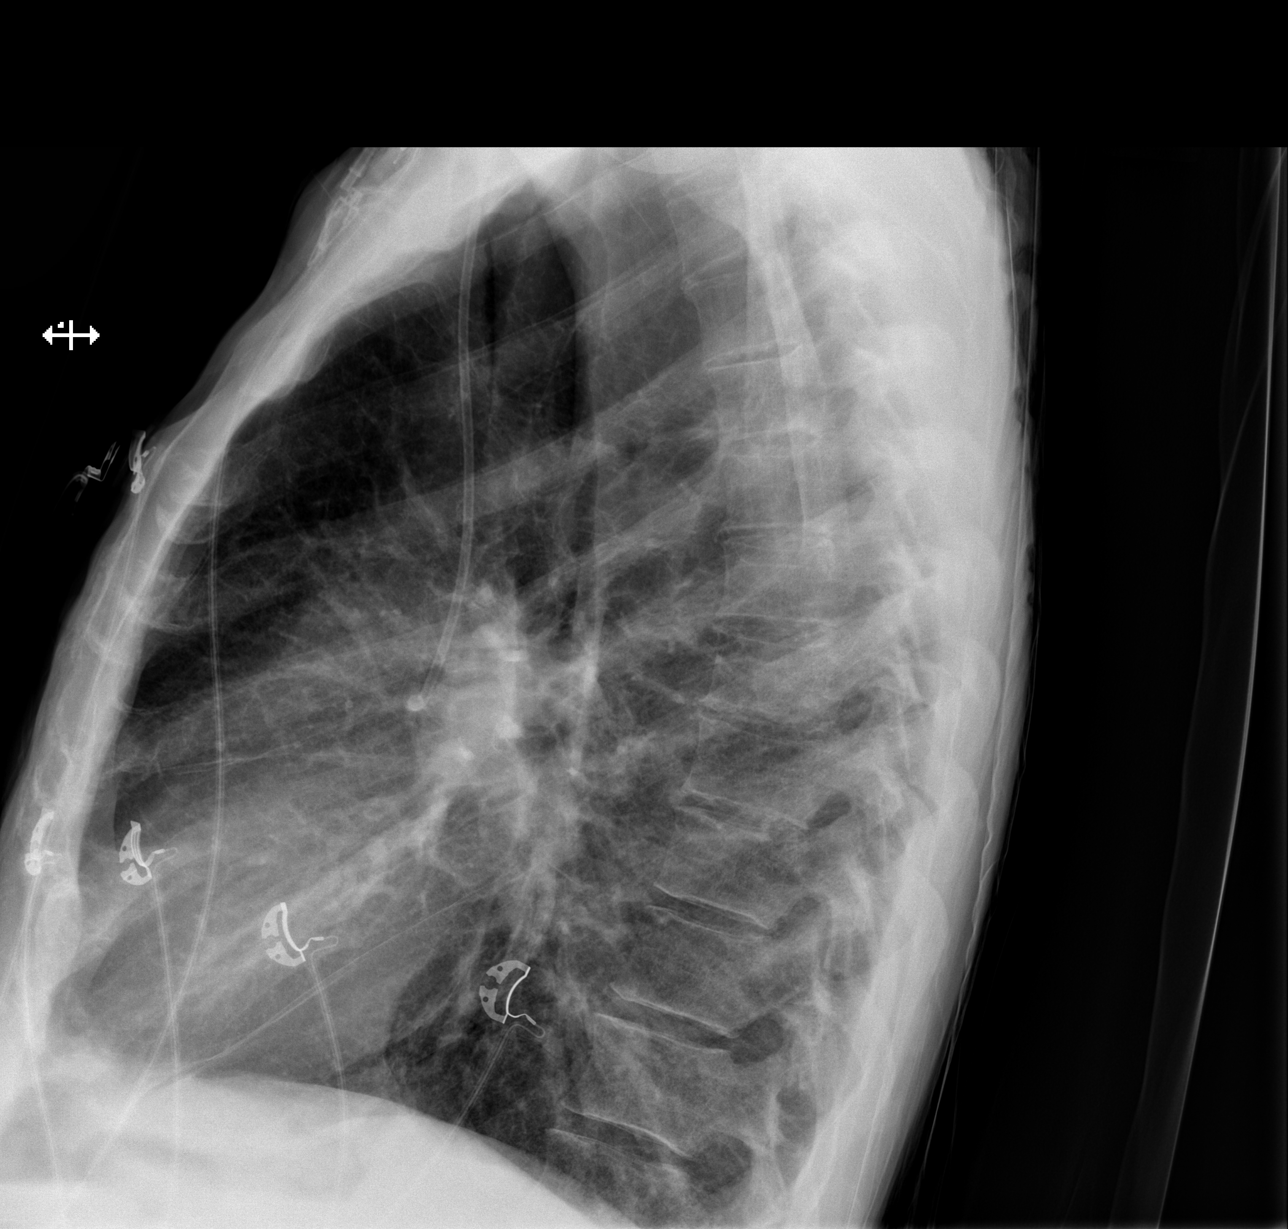

[w chest lat (2 of 2)]
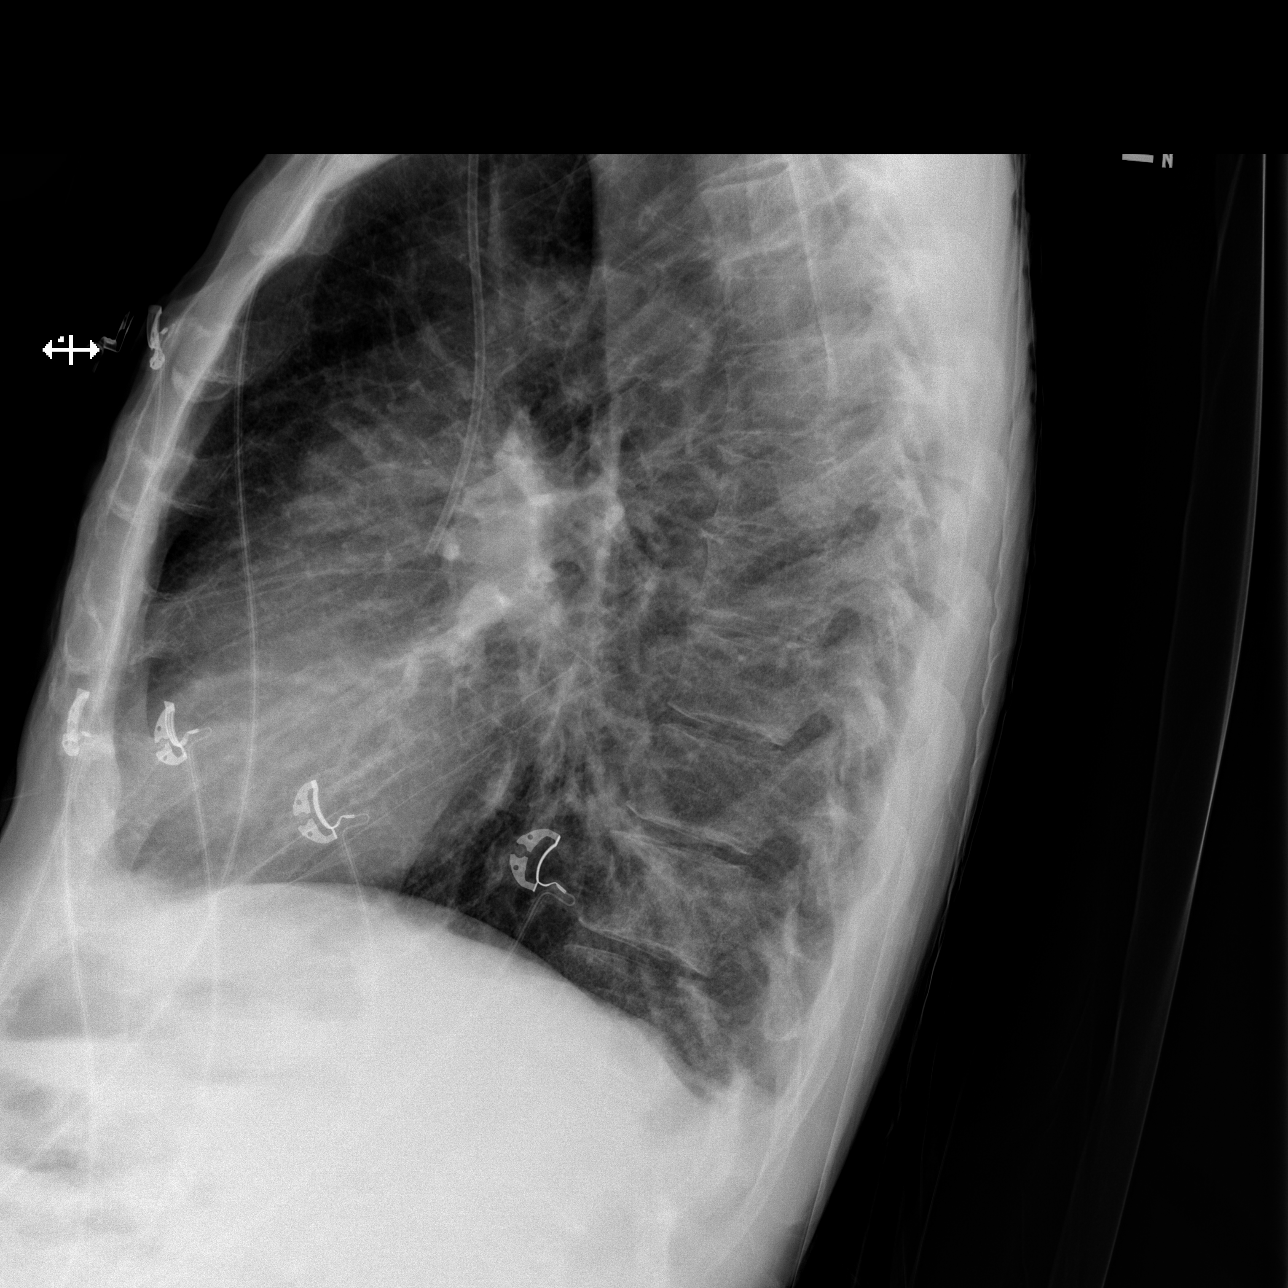

[3 of 3 positions shown; findings below may reference images not displayed]

FINDINGS: Porta catheter on the right with loose kink at the level of the
clavicle crossing. Catheter tip is at the SVC level. Normal heart
size and mediastinal contours. Emphysema. There is no edema,
consolidation, effusion, or pneumothorax. Nodular density over the
right chest wall could be pulmonary or nipple shadow based on
04/27/2016 chest CT. Patient's right thoracic mass and spinal
erosion are obscured.
IMPRESSION: No evidence of acute disease.
